# Patient Record
Sex: Male | Born: 1955
Health system: Southern US, Community
[De-identification: ages and names within clinical notes are randomized; demographics above are authoritative.]

## PROBLEM LIST (undated history)

## (undated) ENCOUNTER — Ambulatory Visit (HOSPITAL_COMMUNITY): Discharge status: Absent without leave | Payer: BLUE CROSS/BLUE SHIELD

## (undated) DIAGNOSIS — K219 Gastro-esophageal reflux disease without esophagitis: Secondary | ICD-10-CM

## (undated) DIAGNOSIS — M1990 Unspecified osteoarthritis, unspecified site: Secondary | ICD-10-CM

## (undated) DIAGNOSIS — K1379 Other lesions of oral mucosa: Secondary | ICD-10-CM

## (undated) DIAGNOSIS — E785 Hyperlipidemia, unspecified: Secondary | ICD-10-CM

## (undated) DIAGNOSIS — K859 Acute pancreatitis without necrosis or infection, unspecified: Secondary | ICD-10-CM

## (undated) DIAGNOSIS — Z5112 Encounter for antineoplastic immunotherapy: Secondary | ICD-10-CM

## (undated) DIAGNOSIS — K436 Other and unspecified ventral hernia with obstruction, without gangrene: Secondary | ICD-10-CM

## (undated) DIAGNOSIS — Z7189 Other specified counseling: Secondary | ICD-10-CM

## (undated) DIAGNOSIS — C3491 Malignant neoplasm of unspecified part of right bronchus or lung: Secondary | ICD-10-CM

## (undated) DIAGNOSIS — K625 Hemorrhage of anus and rectum: Secondary | ICD-10-CM

## (undated) DIAGNOSIS — Z5111 Encounter for antineoplastic chemotherapy: Secondary | ICD-10-CM

## (undated) DIAGNOSIS — K5790 Diverticulosis of intestine, part unspecified, without perforation or abscess without bleeding: Secondary | ICD-10-CM

## (undated) DIAGNOSIS — L270 Generalized skin eruption due to drugs and medicaments taken internally: Secondary | ICD-10-CM

## (undated) DIAGNOSIS — I10 Essential (primary) hypertension: Secondary | ICD-10-CM

## (undated) DIAGNOSIS — Z87442 Personal history of urinary calculi: Secondary | ICD-10-CM

## (undated) DIAGNOSIS — Z716 Tobacco abuse counseling: Secondary | ICD-10-CM

## (undated) HISTORY — DX: Tobacco abuse counseling: Z71.6

## (undated) HISTORY — DX: Malignant neoplasm of unspecified part of right bronchus or lung: C34.91

## (undated) HISTORY — PX: EYE SURGERY: SHX253

## (undated) HISTORY — DX: Generalized skin eruption due to drugs and medicaments taken internally: L27.0

## (undated) HISTORY — DX: Encounter for antineoplastic immunotherapy: Z51.12

## (undated) HISTORY — PX: HERNIA REPAIR: SHX51

## (undated) HISTORY — DX: Encounter for antineoplastic chemotherapy: Z51.11

## (undated) HISTORY — DX: Other specified counseling: Z71.89

## (undated) HISTORY — PX: EXPLORATORY LAPAROTOMY: SUR591

---

## 2001-02-20 ENCOUNTER — Emergency Department (HOSPITAL_COMMUNITY): Admission: EM | Admit: 2001-02-20 | Discharge: 2001-02-21 | Payer: Self-pay | Admitting: Emergency Medicine

## 2002-01-15 ENCOUNTER — Emergency Department (HOSPITAL_COMMUNITY): Admission: EM | Admit: 2002-01-15 | Discharge: 2002-01-15 | Payer: Self-pay | Admitting: Emergency Medicine

## 2002-01-15 ENCOUNTER — Encounter: Payer: Self-pay | Admitting: Family Medicine

## 2002-02-13 ENCOUNTER — Emergency Department (HOSPITAL_COMMUNITY): Admission: EM | Admit: 2002-02-13 | Discharge: 2002-02-13 | Payer: Self-pay | Admitting: Emergency Medicine

## 2002-09-07 ENCOUNTER — Ambulatory Visit (HOSPITAL_COMMUNITY): Admission: RE | Admit: 2002-09-07 | Discharge: 2002-09-07 | Payer: Self-pay | Admitting: *Deleted

## 2003-04-16 ENCOUNTER — Encounter: Payer: Self-pay | Admitting: *Deleted

## 2003-04-16 ENCOUNTER — Emergency Department (HOSPITAL_COMMUNITY): Admission: EM | Admit: 2003-04-16 | Discharge: 2003-04-16 | Payer: Self-pay | Admitting: Emergency Medicine

## 2003-05-23 ENCOUNTER — Emergency Department (HOSPITAL_COMMUNITY): Admission: EM | Admit: 2003-05-23 | Discharge: 2003-05-23 | Payer: Self-pay | Admitting: Emergency Medicine

## 2004-01-03 ENCOUNTER — Emergency Department (HOSPITAL_COMMUNITY): Admission: EM | Admit: 2004-01-03 | Discharge: 2004-01-03 | Payer: Self-pay | Admitting: Emergency Medicine

## 2004-06-08 ENCOUNTER — Emergency Department (HOSPITAL_COMMUNITY): Admission: EM | Admit: 2004-06-08 | Discharge: 2004-06-08 | Payer: Self-pay | Admitting: Emergency Medicine

## 2004-10-01 ENCOUNTER — Emergency Department (HOSPITAL_COMMUNITY): Admission: EM | Admit: 2004-10-01 | Discharge: 2004-10-01 | Payer: Self-pay | Admitting: Emergency Medicine

## 2004-10-26 ENCOUNTER — Emergency Department (HOSPITAL_COMMUNITY): Admission: EM | Admit: 2004-10-26 | Discharge: 2004-10-26 | Payer: Self-pay | Admitting: Emergency Medicine

## 2004-12-18 ENCOUNTER — Emergency Department (HOSPITAL_COMMUNITY): Admission: EM | Admit: 2004-12-18 | Discharge: 2004-12-18 | Payer: Self-pay | Admitting: *Deleted

## 2005-10-29 ENCOUNTER — Emergency Department (HOSPITAL_COMMUNITY): Admission: EM | Admit: 2005-10-29 | Discharge: 2005-10-29 | Payer: Self-pay | Admitting: Emergency Medicine

## 2005-11-16 ENCOUNTER — Inpatient Hospital Stay (HOSPITAL_COMMUNITY): Admission: EM | Admit: 2005-11-16 | Discharge: 2005-11-19 | Payer: Self-pay | Admitting: Emergency Medicine

## 2005-11-16 ENCOUNTER — Ambulatory Visit (HOSPITAL_COMMUNITY): Admission: RE | Admit: 2005-11-16 | Discharge: 2005-11-16 | Payer: Self-pay | Admitting: Orthopedic Surgery

## 2005-11-30 ENCOUNTER — Ambulatory Visit (HOSPITAL_COMMUNITY): Admission: RE | Admit: 2005-11-30 | Discharge: 2005-11-30 | Payer: Self-pay | Admitting: Orthopedic Surgery

## 2005-12-28 ENCOUNTER — Ambulatory Visit (HOSPITAL_COMMUNITY): Admission: RE | Admit: 2005-12-28 | Discharge: 2005-12-29 | Payer: Self-pay | Admitting: Orthopedic Surgery

## 2006-11-16 ENCOUNTER — Emergency Department (HOSPITAL_COMMUNITY): Admission: EM | Admit: 2006-11-16 | Discharge: 2006-11-17 | Payer: Self-pay | Admitting: Emergency Medicine

## 2007-10-27 ENCOUNTER — Ambulatory Visit (HOSPITAL_COMMUNITY): Admission: RE | Admit: 2007-10-27 | Discharge: 2007-10-27 | Payer: Self-pay | Admitting: Urology

## 2008-01-02 ENCOUNTER — Ambulatory Visit (HOSPITAL_BASED_OUTPATIENT_CLINIC_OR_DEPARTMENT_OTHER): Admission: RE | Admit: 2008-01-02 | Discharge: 2008-01-02 | Payer: Self-pay | Admitting: Urology

## 2008-03-12 ENCOUNTER — Inpatient Hospital Stay (HOSPITAL_COMMUNITY): Admission: EM | Admit: 2008-03-12 | Discharge: 2008-03-22 | Payer: Self-pay | Admitting: Emergency Medicine

## 2008-06-29 ENCOUNTER — Emergency Department (HOSPITAL_COMMUNITY): Admission: EM | Admit: 2008-06-29 | Discharge: 2008-06-29 | Payer: Self-pay | Admitting: Emergency Medicine

## 2008-08-27 ENCOUNTER — Emergency Department (HOSPITAL_COMMUNITY): Admission: EM | Admit: 2008-08-27 | Discharge: 2008-08-27 | Payer: Self-pay | Admitting: Emergency Medicine

## 2009-09-22 ENCOUNTER — Encounter: Admission: RE | Admit: 2009-09-22 | Discharge: 2009-09-22 | Payer: Self-pay | Admitting: Orthopedic Surgery

## 2010-11-17 NOTE — Op Note (Signed)
[name], [name]             ACCOUNT NO.:  [id number]   MEDICAL RECORD NO.:  [id number]          PATIENT TYPE:  AMB   LOCATION:  [facility]                         FACILITY:  [facility]   PHYSICIAN:  Lindaann Slough, M.D.  DATE OF BIRTH:  08-07-1955   DATE OF PROCEDURE:  01/02/2008  DATE OF DISCHARGE:                               OPERATIVE REPORT   PREOPERATIVE DIAGNOSIS:  Phimosis and balanitis.   POSTOPERATIVE DIAGNOSIS:  Phimosis and balanitis.   PROCEDURE DONE:  Circumcision.   SURGEON:  Danae Chen, M.D.   ANESTHESIA:  General.   INDICATIONS:  The patient is a 55 year old male diabetic who has been  complaining of multiple breakdowns of the foreskin.  It is difficult for  him to retract the foreskin for hygiene purposes.  He was found on  physical examination to have phimosis and balanitis.  He is scheduled  for circumcision.   Under general anesthesia, the patient was prepped and draped and placed  in the supine position.  A penile block was done with 0.25% Marcaine.  Then, two circumferential incisions were made on the foreskin, and then  the foreskin in between those two incisions was excised.  Hemostasis was  secured with electrocautery.  Then, skin approximation was done with #4-  0 chromic.   The patient tolerated the procedure well and left the O.R. in  satisfactory condition to the postanesthesia care unit.      Lindaann Slough, M.D.  Electronically Signed     MN/MEDQ  D:  01/02/2008  T:  01/02/2008  Job:  [id number]
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
## 2010-11-20 NOTE — Op Note (Signed)
[name], [name]                         ACCOUNT NO.:  [id number]   MEDICAL RECORD NO.:  [id number]                   PATIENT TYPE:  AMB   LOCATION:  ENDO                                 FACILITY:  [facility]   PHYSICIAN:  Sharyn Dross., M.D.               DATE OF BIRTH:  February 03, 1956   DATE OF PROCEDURE:  09/07/2002  DATE OF DISCHARGE:                                 OPERATIVE REPORT   REFERRING PHYSICIAN:  Betti D. Pecola Leisure, M.D.   PREOPERATIVE DIAGNOSES:  1. Family history of colon cancer.  2. Blood in stools.   POSTOPERATIVE DIAGNOSES:  1. Diverticula of the proximal, transverse, and ascending colon.  2. Questionable small polyp approximately 5 mm in the ascending colon     (unable to biopsy or remove).  3. Normal exam otherwise.   PROCEDURE:  Colonoscopy.   MEDICATIONS:  Demerol 70 mg IV, Versed 7 mg IV over a 10 minute period of  time.   INSTRUMENT:  Olympus video pancolonoscope.   ENDOSCOPIST:  Dortha Kern, M.D.   SUBJECTIVELY:  See the H&P accompanying the chart.   OBJECTIVE FINDINGS:  VITAL SIGNS:  Stable.  HEENT:  Arcus senilis.  NECK:  Supple.  LUNGS:  Clear to auscultation.  HEART:  Regular rate and rhythm without heaves, thrills, murmurs, or gallops  noted.  ABDOMEN:  Soft.  There was no tenderness and no distention or organomegaly  noted.  No abdominal scars appreciated.  EXTREMITIES:  No cyanosis, clubbing, or edema.   PLAN:  I am going to proceed with the colonoscopic examination today at this  time.   INFORMED CONSENT:  The patient was advised of the procedure, the  indications, and the risks involved.  The patient has agreed to have the  procedure performed at this time.   PREOPERATIVE PREPARATION:  The patient presented to [facility] endoscopy  unit with the IV for IV sedating medication was started.  A monitor was  placed on the patient to monitor the patient's vital signs and oxygen  saturation.  Nasal oxygen at 2 liters per minute was  used, and after  adequate sedation was performed, the procedure was begun.   BOWEL PREPARATION:  The patient was given Visicol tablets and Dulcolax  tablets.  The patient tolerated the bowel prep without any difficulties.  The quality of the prep was good to excellent.   PROCEDURE NOTE:  The instrument was advanced with the patient lying in the  left lateral position to approximately 120 mL proximal colon to the cecal  area.  This was confirmed by visualization of the appendiceal orifice as  well as the ileocecal valve that was noted.  It was also noted by palpation  at this time.  Transillumination could be visualized.   There appeared to be no gross abnormalities such as masses, polyps, or  stricture lesions appreciated at this time.  The vascular pattern appeared  to  be well within normal limits throughout the entire colon that was noted.   However, the mucosal pattern showed evidence of proximal, transverse, and  ascending diverticula that were present.  The diverticula appeared to be  small in character at this time.  There appeared to be no evidence of any  bleeding sites that are noted.  It should also be noted that there was a  small, approximately 5 mm polypoid lesion that was noted.  This was seen on  visualization in the ascending colon.  Attempts to feel this to determine if  this was mucosal folds were unsuccessful with the manipulation of the  instrument.  Thus a biopsy forceps was then advanced into the region, but  visualization as the forcep approached the area for movement and palpation  was unable to be visualized for an adequate successful approach to the area.  This was subsequently halted up to approximately 5-10 minutes of attempting  to evaluate this area more extensively.  There was no discoloration of the  polyp and initial lesion that was seen.  And again, it still could possibly  be a fold.  Photographs were taken of the region at this time.   The  instrument was gradually retracted back where visualization of the rest  of the colon again was unremarkable.  There was no evidence of bleeding  appreciated throughout any other area or any other types of lesions that  were noted.  As the instrument approached the anal verge, the instrument was  retroflexed with visualization of the perianal region that was noted. Again,  there was no evidence of any vascular abnormalities or obstructive process  that was present.  The instrument was corrected and gradually retracted back  at this time.  I did not appreciate any external hemorrhoids upon exiting  from the area.  The patient tolerated the procedure well throughout the  region without difficulty.   TREATMENT:  1. Presently, there is no specific treatment at this time except     recommending that the patient use high fiber products and tentatively     avoid products with seeds in them because of the history of rectal     bleeding that was noted.  2. I would recommend repeating the procedure in approximately 3-5 years     unless evidence of re-bleeding does occur at this time and depending upon     the extent of the bleeding, will determine if the procedure would need to     be done when and if it occurs.  3.  We will have the patient follow up     with me in two weeks to discuss the results of the procedure.                                               Sharyn Dross., M.D.   JM/MEDQ  D:  09/07/2002  T:  09/07/2002  Job:  [id number]   cc:   Jocelyn Lamer D. Pecola Leisure, M.D.  [phone] [address], [address]  [place]  [place] [address]  Fax: [phone]

## 2010-11-20 NOTE — Discharge Summary (Signed)
[name], [name]             ACCOUNT NO.:  [id number]   MEDICAL RECORD NO.:  [id number]          PATIENT TYPE:  INP   LOCATION:  1506                         FACILITY:  [facility]   PHYSICIAN:  Fleet Contras, M.D.    DATE OF BIRTH:  1955/08/26   DATE OF ADMISSION:  03/12/2008  DATE OF DISCHARGE:  03/22/2008                               DISCHARGE SUMMARY   ADMISSION DIAGNOSES:  1. Acute pancreatitis.  2. Uncontrolled type 2 diabetes mellitus.  3. Alcohol abuse   HISTORY OF PRESENT ILLNESS:  Please see the dictated H and P for full  details.  In summary, Mr. Dagher is a 52-year African American  gentleman with past medical history significant for recurrent acute  pancreatitis, type 2 diabetes mellitus, systemic hypertension, gouty  arthritis, gastroesophageal reflux disease, dyslipidemia, alcohol abuse,  tobacco abuse and history of rotator cuff tear of the shoulder.  He came  to the emergency room at [facility] with severe abdominal pain  associated with nausea, but no vomiting, no diarrhea.  No fevers or  chills.  He had admitted he had been drinking some alcohol over the  weekend, followed by pain in the lower back which progressively moved to  the abdomen.  He had no urinary symptoms.  He had not used any  nonsteroidal anti-inflammatory drugs.  In the emergency room he was  noted to have a markedly elevated liver enzymes.  A CT scan of the  abdomen confirmed acute pancreatitis and he was therefore hospitalized  for further treatment.   HOSPITAL COURSE:  On admission he was started on IV fluids with D5  normal saline at 100 mL an hour.  He was kept NPO. CBGs were checked  a.c. and nightly and he was on IV morphine 2-4 mg q.4h. and IV Protonix  40 mg daily, IV Zofran 4 mg q.4h.  He was placed on IV Zosyn when he  developed a fever of 102.2.  Cultures were obtained and by the time of  discharge these were all negative for blood and urine.  Diet was slowly  increased.   His repeated abdominal x-rays showed dilated small bowel  loops compatible with ileus.  The surgical consult was requested.  The  patient was kindly seen by Almond Lint, MD who thought that this was  mostly alcohol related pancreatitis with the ileus and no surgical  intervention was needed.  A ultrasound scan of the gallbladder was  performed and showed sludge only with no stones.  On March 22, 2008,  he was feeling much better, able to tolerate full diet with regular  bowel movements.  His abdominal pain and distention has subsided and he  was considered stable for discharge home.   DISCHARGE DIAGNOSES:  1. Acute pancreatitis, alcohol related.  2. Small bowel ileus.  3. Type 2 diabetes mellitus.  4. Alcohol abuse.   His condition on discharge was stable.   His disposition was for home.  He was to follow-up with me in the office  in 2 weeks.  He  was strongly advised to quit alcohol use.   DISCHARGE MEDICATIONS:  1. Lortab 5/325 one to two q.6h. p.r.n.  2. Nicotine patch 21 mg per day.  3. Thiamine 100 mg daily.  4. Multivitamin once a day.  5. Amaryl 2 mg daily.  6. Colchicine is 0.6 mg b.i.d. p.r.n.  7. Xyzal 5 mg daily.  8. Allopurinol 300 mg daily.  9. Prevacid 30 mg daily.  10.Calcium 600 mg daily.      Fleet Contras, M.D.  Electronically Signed     EA/MEDQ  D:  04/17/2008  T:  04/17/2008  Job:  [id number]

## 2010-11-20 NOTE — Op Note (Signed)
NAMEBLANCA, [name]             ACCOUNT NO.:  [id number]   MEDICAL RECORD NO.:  [id number]          PATIENT TYPE:  OIB   LOCATION:  5016                         FACILITY:  [facility]   PHYSICIAN:  Burnard Bunting, M.D.    DATE OF BIRTH:  [birth year]   DATE OF PROCEDURE:  12/28/2005  DATE OF DISCHARGE:  12/29/2005                                 OPERATIVE REPORT   PREOPERATIVE DIAGNOSIS:  Right shoulder bursitis, possible rotator cuff  tear.   POSTOPERATIVE DIAGNOSIS:  Right shoulder bursitis.   PROCEDURE:  Right shoulder diagnostic arthroscopy with subacromial  decompression.   SURGEON:  Dorene Grebe, M.D.   ANESTHESIA:  General endotracheal.   ESTIMATED BLOOD LOSS:  10 mL.   DRAINS:  None.   INDICATIONS:  [name] is a 55 year old patient with right shoulder  pain.  Arthrogram suggested possible rotator cuff tear; however, this was  not found at the time of surgery.  He presents now for diagnostic  arthroscopy, subacromial decompression, and possible rotator cuff repair.   OPERATIVE FINDINGS:  1.  Examination under anesthesia.  Range of motion:  Forward flexion 180,      external rotation 50 degrees, abduction is at 70.  He has a less than 1+      anterior and posterior instability.  2.  Diagnostic arthroscopy.      1.  No rotator cuff tear from the articular surface.      2.  Intact labrum.      3.  Intact articular surface.      4.  Bursitis in the subacromial space.   With no evidence of rotator cuff tear, we viewed on the bursal side.   PROCEDURE IN DETAIL:  The patient was brought to the operating room where  general endotracheal anesthesia was induced.  He was positioned in the  supine position with the head in a neutral position and the right and left  arm well padded.  Right shoulder, hand, and arm were prepped in DuraPrep  solution and draped in a sterile manner.  Topographic anatomy of the  shoulder was identified, including the posterolateral and anterior  margins  of the acromion.  Solution of saline and epinephrine was injected into the  subacromial space.  A solution of saline was injected into the glenohumeral  joint.  The trocar was then placed through the posterior portal and into the  glenohumeral joint 2 cm medial and inferior to the posterolateral margin of  the acromion.  The joint was inflated, anterior portal was created under  direct visualization, and diagnostic arthroscopy was performed.  The  glenohumeral articular surface was intact.  The labrum was intact, including  the biceps anchor.  There was no rotator cuff tear identified within the  articular viewing area.  Rotator interval was free of synovitis.  A lateral  portal was created and the scope was placed into the subacromial space.  Significant bursitis was encountered.  The CA ligament was released.  Subacromial decompression with bursectomy and co-planing of the clavicle and  acromion was performed using cutting block technique.  Again, no definite  rotator cuff tear was seen.  At this time, the instruments were removed from  the portals, which were closed using 3-0 nylon suture.  The patient was then  placed in a bulky shoulder dressing, having tolerated the procedure well  without any complications.           ______________________________  G. Dorene Grebe, M.D.     GSD/MEDQ  D:  01/30/2006  T:  01/30/2006  Job:  [id number]
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
## 2011-02-17 ENCOUNTER — Emergency Department (HOSPITAL_COMMUNITY): Payer: Self-pay

## 2011-02-17 ENCOUNTER — Inpatient Hospital Stay (HOSPITAL_COMMUNITY)
Admission: EM | Admit: 2011-02-17 | Discharge: 2011-02-20 | DRG: 684 | Disposition: A | Discharge status: Home / Self Care | Payer: Self-pay | Attending: Internal Medicine | Admitting: Internal Medicine

## 2011-02-17 DIAGNOSIS — M109 Gout, unspecified: Secondary | ICD-10-CM | POA: Diagnosis present

## 2011-02-17 DIAGNOSIS — J4489 Other specified chronic obstructive pulmonary disease: Secondary | ICD-10-CM | POA: Diagnosis present

## 2011-02-17 DIAGNOSIS — R7402 Elevation of levels of lactic acid dehydrogenase (LDH): Secondary | ICD-10-CM | POA: Diagnosis present

## 2011-02-17 DIAGNOSIS — N179 Acute kidney failure, unspecified: Principal | ICD-10-CM | POA: Diagnosis present

## 2011-02-17 DIAGNOSIS — I10 Essential (primary) hypertension: Secondary | ICD-10-CM | POA: Diagnosis present

## 2011-02-17 DIAGNOSIS — J309 Allergic rhinitis, unspecified: Secondary | ICD-10-CM | POA: Diagnosis present

## 2011-02-17 DIAGNOSIS — F1010 Alcohol abuse, uncomplicated: Secondary | ICD-10-CM | POA: Diagnosis present

## 2011-02-17 DIAGNOSIS — F17200 Nicotine dependence, unspecified, uncomplicated: Secondary | ICD-10-CM | POA: Diagnosis present

## 2011-02-17 DIAGNOSIS — T383X5A Adverse effect of insulin and oral hypoglycemic [antidiabetic] drugs, initial encounter: Secondary | ICD-10-CM | POA: Diagnosis present

## 2011-02-17 DIAGNOSIS — T46905A Adverse effect of unspecified agents primarily affecting the cardiovascular system, initial encounter: Secondary | ICD-10-CM | POA: Diagnosis present

## 2011-02-17 DIAGNOSIS — E860 Dehydration: Secondary | ICD-10-CM | POA: Diagnosis present

## 2011-02-17 DIAGNOSIS — E119 Type 2 diabetes mellitus without complications: Secondary | ICD-10-CM | POA: Diagnosis present

## 2011-02-17 DIAGNOSIS — R7401 Elevation of levels of liver transaminase levels: Secondary | ICD-10-CM | POA: Diagnosis present

## 2011-02-17 DIAGNOSIS — E785 Hyperlipidemia, unspecified: Secondary | ICD-10-CM | POA: Diagnosis present

## 2011-02-17 DIAGNOSIS — K219 Gastro-esophageal reflux disease without esophagitis: Secondary | ICD-10-CM | POA: Diagnosis present

## 2011-02-17 DIAGNOSIS — J449 Chronic obstructive pulmonary disease, unspecified: Secondary | ICD-10-CM | POA: Diagnosis present

## 2011-02-18 ENCOUNTER — Inpatient Hospital Stay (HOSPITAL_COMMUNITY): Payer: Self-pay

## 2011-02-18 LAB — BASIC METABOLIC PANEL
BUN: 36 mg/dL — ABNORMAL HIGH (ref 6–23)
CO2: 24 mEq/L (ref 19–32)
Calcium: 10.1 mg/dL (ref 8.4–10.5)
Chloride: 100 mEq/L (ref 96–112)
Creatinine, Ser: 3 mg/dL — ABNORMAL HIGH (ref 0.50–1.35)
GFR calc Af Amer: 26 mL/min — ABNORMAL LOW (ref >60)
GFR calc non Af Amer: 22 mL/min — ABNORMAL LOW (ref >60)
Glucose, Bld: 89 mg/dL (ref 70–99)
Potassium: 4.1 mEq/L (ref 3.5–5.1)
Sodium: 136 mEq/L (ref 135–145)

## 2011-02-18 LAB — CBC
HCT: 35.2 % — ABNORMAL LOW (ref 39.0–52.0)
HCT: 36.2 % — ABNORMAL LOW (ref 39.0–52.0)
Hemoglobin: 12.3 g/dL — ABNORMAL LOW (ref 13.0–17.0)
Hemoglobin: 12.6 g/dL — ABNORMAL LOW (ref 13.0–17.0)
MCH: 32.4 pg (ref 26.0–34.0)
MCH: 32.5 pg (ref 26.0–34.0)
MCHC: 34.8 g/dL (ref 30.0–36.0)
MCHC: 34.9 g/dL (ref 30.0–36.0)
MCV: 92.9 fL (ref 78.0–100.0)
MCV: 93.1 fL (ref 78.0–100.0)
Platelets: 172 10*3/uL (ref 150–400)
Platelets: 200 10*3/uL (ref 150–400)
RBC: 3.79 MIL/uL — ABNORMAL LOW (ref 4.22–5.81)
RBC: 3.89 MIL/uL — ABNORMAL LOW (ref 4.22–5.81)
RDW: 15 % (ref 11.5–15.5)
RDW: 15.2 % (ref 11.5–15.5)
WBC: 7.2 10*3/uL (ref 4.0–10.5)
WBC: 8.6 10*3/uL (ref 4.0–10.5)

## 2011-02-18 LAB — DIFFERENTIAL
Basophils Absolute: 0.1 10*3/uL (ref 0.0–0.1)
Basophils Relative: 1 % (ref 0–1)
Eosinophils Absolute: 0.3 10*3/uL (ref 0.0–0.7)
Eosinophils Relative: 3 % (ref 0–5)
Lymphocytes Relative: 43 % (ref 12–46)
Lymphs Abs: 3.7 10*3/uL (ref 0.7–4.0)
Monocytes Absolute: 0.9 10*3/uL (ref 0.1–1.0)
Monocytes Relative: 10 % (ref 3–12)
Neutro Abs: 3.6 10*3/uL (ref 1.7–7.7)
Neutrophils Relative %: 43 % (ref 43–77)

## 2011-02-18 LAB — COMPREHENSIVE METABOLIC PANEL
ALT: 111 U/L — ABNORMAL HIGH (ref 0–53)
AST: 88 U/L — ABNORMAL HIGH (ref 0–37)
Albumin: 3.1 g/dL — ABNORMAL LOW (ref 3.5–5.2)
Alkaline Phosphatase: 89 U/L (ref 39–117)
BUN: 33 mg/dL — ABNORMAL HIGH (ref 6–23)
CO2: 23 mEq/L (ref 19–32)
Calcium: 10.3 mg/dL (ref 8.4–10.5)
Chloride: 100 mEq/L (ref 96–112)
Creatinine, Ser: 4.24 mg/dL — ABNORMAL HIGH (ref 0.50–1.35)
GFR calc Af Amer: 18 mL/min — ABNORMAL LOW (ref >60)
GFR calc non Af Amer: 15 mL/min — ABNORMAL LOW (ref >60)
Glucose, Bld: 105 mg/dL — ABNORMAL HIGH (ref 70–99)
Potassium: 4 mEq/L (ref 3.5–5.1)
Sodium: 137 mEq/L (ref 135–145)
Total Bilirubin: 0.8 mg/dL (ref 0.3–1.2)
Total Protein: 7.4 g/dL (ref 6.0–8.3)

## 2011-02-18 LAB — GLUCOSE, CAPILLARY
Glucose-Capillary: 90 mg/dL (ref 70–99)
Glucose-Capillary: 105 mg/dL — ABNORMAL HIGH (ref 70–99)
Glucose-Capillary: 87 mg/dL (ref 70–99)
Glucose-Capillary: 90 mg/dL (ref 70–99)

## 2011-02-18 LAB — URINE MICROSCOPIC-ADD ON

## 2011-02-18 LAB — PHOSPHORUS: Phosphorus: 3.6 mg/dL (ref 2.3–4.6)

## 2011-02-18 LAB — URINALYSIS, ROUTINE W REFLEX MICROSCOPIC
Glucose, UA: NEGATIVE mg/dL
Hgb urine dipstick: NEGATIVE
Ketones, ur: 15 mg/dL — AB
Nitrite: NEGATIVE
Protein, ur: 100 mg/dL — AB
Specific Gravity, Urine: 1.022 (ref 1.005–1.030)
Urobilinogen, UA: 1 mg/dL (ref 0.0–1.0)
pH: 5.5 (ref 5.0–8.0)

## 2011-02-18 LAB — RAPID URINE DRUG SCREEN, HOSP PERFORMED
Amphetamines: NOT DETECTED
Barbiturates: NOT DETECTED
Benzodiazepines: NOT DETECTED
Cocaine: POSITIVE — AB
Opiates: NOT DETECTED
Tetrahydrocannabinol: NOT DETECTED

## 2011-02-18 LAB — PROTIME-INR
INR: 1.12 (ref 0.00–1.49)
Prothrombin Time: 14.6 seconds (ref 11.6–15.2)

## 2011-02-18 LAB — AMMONIA: Ammonia: 34 umol/L (ref 11–60)

## 2011-02-18 LAB — POCT I-STAT TROPONIN I
Troponin i, poc: 0 ng/mL (ref 0.00–0.08)
Troponin i, poc: 0 ng/mL (ref 0.00–0.08)

## 2011-02-18 LAB — ETHANOL: Alcohol, Ethyl (B): 182 mg/dL — ABNORMAL HIGH (ref 0–11)
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
## 2011-02-19 LAB — GLUCOSE, CAPILLARY
Glucose-Capillary: 106 mg/dL — ABNORMAL HIGH (ref 70–99)
Glucose-Capillary: 115 mg/dL — ABNORMAL HIGH (ref 70–99)
Glucose-Capillary: 97 mg/dL (ref 70–99)
Glucose-Capillary: 112 mg/dL — ABNORMAL HIGH (ref 70–99)

## 2011-02-19 LAB — BASIC METABOLIC PANEL
BUN: 30 mg/dL — ABNORMAL HIGH (ref 6–23)
CO2: 26 mEq/L (ref 19–32)
Calcium: 9.2 mg/dL (ref 8.4–10.5)
Chloride: 100 mEq/L (ref 96–112)
Creatinine, Ser: 1.5 mg/dL — ABNORMAL HIGH (ref 0.50–1.35)
GFR calc Af Amer: 59 mL/min — ABNORMAL LOW (ref >60)
GFR calc non Af Amer: 48 mL/min — ABNORMAL LOW (ref >60)
Glucose, Bld: 87 mg/dL (ref 70–99)
Potassium: 3.8 mEq/L (ref 3.5–5.1)
Sodium: 134 mEq/L — ABNORMAL LOW (ref 135–145)

## 2011-02-19 LAB — CBC
HCT: 35 % — ABNORMAL LOW (ref 39.0–52.0)
Hemoglobin: 12 g/dL — ABNORMAL LOW (ref 13.0–17.0)
MCH: 32.4 pg (ref 26.0–34.0)
MCHC: 34.3 g/dL (ref 30.0–36.0)
MCV: 94.6 fL (ref 78.0–100.0)
Platelets: 162 10*3/uL (ref 150–400)
RBC: 3.7 MIL/uL — ABNORMAL LOW (ref 4.22–5.81)
RDW: 15.2 % (ref 11.5–15.5)
WBC: 5.8 10*3/uL (ref 4.0–10.5)

## 2011-02-19 LAB — B. BURGDORFI ANTIBODIES: B burgdorferi Ab IgG+IgM: 0.17 {ISR}

## 2011-02-19 LAB — HEPATITIS PANEL, ACUTE
HCV Ab: NEGATIVE
Hep A IgM: NEGATIVE
Hep B C IgM: NEGATIVE
Hepatitis B Surface Ag: NEGATIVE

## 2011-02-19 LAB — VITAMIN B12: Vitamin B-12: 981 pg/mL — ABNORMAL HIGH (ref 211–911)

## 2011-02-20 LAB — BASIC METABOLIC PANEL
BUN: 19 mg/dL (ref 6–23)
CO2: 26 mEq/L (ref 19–32)
Calcium: 9.2 mg/dL (ref 8.4–10.5)
Chloride: 101 mEq/L (ref 96–112)
Creatinine, Ser: 1.34 mg/dL (ref 0.50–1.35)
GFR calc Af Amer: >60 mL/min (ref >60)
GFR calc non Af Amer: 55 mL/min — ABNORMAL LOW (ref >60)
Glucose, Bld: 107 mg/dL — ABNORMAL HIGH (ref 70–99)
Potassium: 3.7 mEq/L (ref 3.5–5.1)
Sodium: 136 mEq/L (ref 135–145)

## 2011-02-20 LAB — LIPASE, BLOOD: Lipase: 58 U/L (ref 11–59)

## 2011-02-20 LAB — GLUCOSE, CAPILLARY
Glucose-Capillary: 123 mg/dL — ABNORMAL HIGH (ref 70–99)
Glucose-Capillary: 109 mg/dL — ABNORMAL HIGH (ref 70–99)

## 2011-02-20 LAB — HEMOGLOBIN A1C
Hgb A1c MFr Bld: 6.1 % — ABNORMAL HIGH (ref <5.7)
Mean Plasma Glucose: 128 mg/dL — ABNORMAL HIGH (ref <117)
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
## 2011-03-30 LAB — POCT I-STAT, CHEM 8
BUN: 21
Calcium, Ion: 1.25
Chloride: 107
Creatinine, Ser: 1.4
Glucose, Bld: 97
HCT: 45
Hemoglobin: 15.3
Potassium: 4
Sodium: 140
TCO2: 24

## 2011-03-30 LAB — ETHANOL: Alcohol, Ethyl (B): 64 — ABNORMAL HIGH

## 2011-04-01 LAB — POCT I-STAT 4, (NA,K, GLUC, HGB,HCT)
Glucose, Bld: 145 — ABNORMAL HIGH
HCT: 46
Hemoglobin: 15.6
Operator id: 268271
Potassium: 3.8
Sodium: 135

## 2011-04-01 LAB — ETHANOL: Alcohol, Ethyl (B): <5

## 2011-04-05 LAB — GLUCOSE, CAPILLARY
Glucose-Capillary: 100 — ABNORMAL HIGH
Glucose-Capillary: 106 — ABNORMAL HIGH
Glucose-Capillary: 108 — ABNORMAL HIGH
Glucose-Capillary: 113 — ABNORMAL HIGH
Glucose-Capillary: 114 — ABNORMAL HIGH
Glucose-Capillary: 116 — ABNORMAL HIGH
Glucose-Capillary: 123 — ABNORMAL HIGH
Glucose-Capillary: 123 — ABNORMAL HIGH
Glucose-Capillary: 134 — ABNORMAL HIGH
Glucose-Capillary: 88
Glucose-Capillary: 88
Glucose-Capillary: 90
Glucose-Capillary: 92
Glucose-Capillary: 92

## 2011-04-05 LAB — COMPREHENSIVE METABOLIC PANEL
ALT: 46
AST: 40 — ABNORMAL HIGH
Albumin: 2.3 — ABNORMAL LOW
Alkaline Phosphatase: 111
BUN: 10
CO2: 27
Calcium: 9.1
Chloride: 102
Creatinine, Ser: 0.99
GFR calc Af Amer: >60
GFR calc non Af Amer: >60
Glucose, Bld: 105 — ABNORMAL HIGH
Potassium: 3.8
Sodium: 136
Total Bilirubin: 1
Total Protein: 7.3

## 2011-04-05 LAB — CBC
HCT: 29.4 — ABNORMAL LOW
HCT: 31.1 — ABNORMAL LOW
Hemoglobin: 10.2 — ABNORMAL LOW
Hemoglobin: 10.6 — ABNORMAL LOW
MCHC: 34.2
MCHC: 34.7
MCV: 93.8
MCV: 95.4
Platelets: 279
Platelets: 386
RBC: 3.13 — ABNORMAL LOW
RBC: 3.26 — ABNORMAL LOW
RDW: 13.7
RDW: 14.1
WBC: 10.7 — ABNORMAL HIGH
WBC: 13.8 — ABNORMAL HIGH

## 2011-04-05 LAB — BASIC METABOLIC PANEL
BUN: 12
CO2: 22
Calcium: 8.9
Chloride: 102
Creatinine, Ser: 0.97
GFR calc Af Amer: >60
GFR calc non Af Amer: >60
Glucose, Bld: 95
Potassium: 3.6
Sodium: 135

## 2011-04-05 LAB — AMMONIA: Ammonia: 30

## 2011-04-07 LAB — POCT I-STAT, CHEM 8
BUN: 12
Calcium, Ion: 1.01 — ABNORMAL LOW
Chloride: 106
Creatinine, Ser: 1.3
Glucose, Bld: 165 — ABNORMAL HIGH
HCT: 48
Hemoglobin: 16.3
Potassium: 4.7
Sodium: 134 — ABNORMAL LOW
TCO2: 25

## 2011-04-07 LAB — GLUCOSE, CAPILLARY
Glucose-Capillary: 102 — ABNORMAL HIGH
Glucose-Capillary: 117 — ABNORMAL HIGH
Glucose-Capillary: 118 — ABNORMAL HIGH
Glucose-Capillary: 124 — ABNORMAL HIGH
Glucose-Capillary: 134 — ABNORMAL HIGH
Glucose-Capillary: 137 — ABNORMAL HIGH
Glucose-Capillary: 141 — ABNORMAL HIGH
Glucose-Capillary: 145 — ABNORMAL HIGH
Glucose-Capillary: 145 — ABNORMAL HIGH
Glucose-Capillary: 158 — ABNORMAL HIGH
Glucose-Capillary: 160 — ABNORMAL HIGH
Glucose-Capillary: 164 — ABNORMAL HIGH
Glucose-Capillary: 165 — ABNORMAL HIGH
Glucose-Capillary: 175 — ABNORMAL HIGH
Glucose-Capillary: 179 — ABNORMAL HIGH
Glucose-Capillary: 183 — ABNORMAL HIGH
Glucose-Capillary: 186 — ABNORMAL HIGH
Glucose-Capillary: 190 — ABNORMAL HIGH
Glucose-Capillary: 193 — ABNORMAL HIGH
Glucose-Capillary: 194 — ABNORMAL HIGH
Glucose-Capillary: 80
Glucose-Capillary: 81
Glucose-Capillary: 81
Glucose-Capillary: 82
Glucose-Capillary: 97

## 2011-04-07 LAB — CBC
HCT: 31.7 — ABNORMAL LOW
HCT: 32.4 — ABNORMAL LOW
HCT: 34.7 — ABNORMAL LOW
HCT: 35.8 — ABNORMAL LOW
HCT: 39.3
HCT: 40.1
Hemoglobin: 11 — ABNORMAL LOW
Hemoglobin: 11.1 — ABNORMAL LOW
Hemoglobin: 11.9 — ABNORMAL LOW
Hemoglobin: 12.4 — ABNORMAL LOW
Hemoglobin: 13.1
Hemoglobin: 13.9
MCHC: 33.7
MCHC: 34.1
MCHC: 34.3
MCHC: 34.6
MCHC: 34.6
MCHC: 34.7
MCV: 93.8
MCV: 94.1
MCV: 94.3
MCV: 94.8
MCV: 95.5
MCV: 95.7
Platelets: 171
Platelets: 184
Platelets: 184
Platelets: 185
Platelets: 232
Platelets: 247
RBC: 3.37 — ABNORMAL LOW
RBC: 3.4 — ABNORMAL LOW
RBC: 3.63 — ABNORMAL LOW
RBC: 3.79 — ABNORMAL LOW
RBC: 4.2 — ABNORMAL LOW
RBC: 4.23
RDW: 13.8
RDW: 14.3
RDW: 14.4
RDW: 14.4
RDW: 14.6
RDW: 14.7
WBC: 10
WBC: 10
WBC: 10.6 — ABNORMAL HIGH
WBC: 11 — ABNORMAL HIGH
WBC: 8.9
WBC: 9.2

## 2011-04-07 LAB — DIFFERENTIAL
Basophils Absolute: 0
Basophils Absolute: 0
Basophils Relative: 0
Basophils Relative: 0
Eosinophils Absolute: 0.1
Eosinophils Absolute: 0.1
Eosinophils Relative: 1
Eosinophils Relative: 2
Lymphocytes Relative: 15
Lymphocytes Relative: 16
Lymphs Abs: 1.5
Lymphs Abs: 1.5
Monocytes Absolute: 0.4
Monocytes Absolute: 0.9
Monocytes Relative: 4
Monocytes Relative: 9
Neutro Abs: 7.3
Neutro Abs: 7.4
Neutrophils Relative %: 75
Neutrophils Relative %: 79 — ABNORMAL HIGH

## 2011-04-07 LAB — COMPREHENSIVE METABOLIC PANEL
ALT: 39
ALT: 53
ALT: 60 — ABNORMAL HIGH
ALT: 68 — ABNORMAL HIGH
ALT: 98 — ABNORMAL HIGH
AST: 132 — ABNORMAL HIGH
AST: 36
AST: 53 — ABNORMAL HIGH
AST: 70 — ABNORMAL HIGH
AST: 73 — ABNORMAL HIGH
Albumin: 2.3 — ABNORMAL LOW
Albumin: 2.4 — ABNORMAL LOW
Albumin: 2.4 — ABNORMAL LOW
Albumin: 2.7 — ABNORMAL LOW
Albumin: 3.2 — ABNORMAL LOW
Alkaline Phosphatase: 100
Alkaline Phosphatase: 54
Alkaline Phosphatase: 60
Alkaline Phosphatase: 64
Alkaline Phosphatase: 79
BUN: 11
BUN: 12
BUN: 15
BUN: 16
BUN: 9
CO2: 19
CO2: 21
CO2: 22
CO2: 22
CO2: 23
Calcium: 7.2 — ABNORMAL LOW
Calcium: 7.5 — ABNORMAL LOW
Calcium: 8.1 — ABNORMAL LOW
Calcium: 8.2 — ABNORMAL LOW
Calcium: 8.9
Chloride: 102
Chloride: 103
Chloride: 104
Chloride: 104
Chloride: 107
Creatinine, Ser: 0.98
Creatinine, Ser: 1.1
Creatinine, Ser: 1.23
Creatinine, Ser: 1.67 — ABNORMAL HIGH
Creatinine, Ser: 1.99 — ABNORMAL HIGH
GFR calc Af Amer: 43 — ABNORMAL LOW
GFR calc Af Amer: 52 — ABNORMAL LOW
GFR calc Af Amer: >60
GFR calc Af Amer: >60
GFR calc Af Amer: >60
GFR calc non Af Amer: 35 — ABNORMAL LOW
GFR calc non Af Amer: 43 — ABNORMAL LOW
GFR calc non Af Amer: >60
GFR calc non Af Amer: >60
GFR calc non Af Amer: >60
Glucose, Bld: 106 — ABNORMAL HIGH
Glucose, Bld: 152 — ABNORMAL HIGH
Glucose, Bld: 169 — ABNORMAL HIGH
Glucose, Bld: 175 — ABNORMAL HIGH
Glucose, Bld: 199 — ABNORMAL HIGH
Potassium: 3.7
Potassium: 3.7
Potassium: 4
Potassium: 4.7
Potassium: 6.4
Sodium: 132 — ABNORMAL LOW
Sodium: 134 — ABNORMAL LOW
Sodium: 134 — ABNORMAL LOW
Sodium: 135
Sodium: 136
Total Bilirubin: 1.6 — ABNORMAL HIGH
Total Bilirubin: 2.2 — ABNORMAL HIGH
Total Bilirubin: 2.3 — ABNORMAL HIGH
Total Bilirubin: 2.8 — ABNORMAL HIGH
Total Bilirubin: 3.7 — ABNORMAL HIGH
Total Protein: 6.6
Total Protein: 6.7
Total Protein: 7.1
Total Protein: 7.1
Total Protein: 7.7

## 2011-04-07 LAB — URINALYSIS, ROUTINE W REFLEX MICROSCOPIC
Bilirubin Urine: NEGATIVE
Glucose, UA: 100 — AB
Glucose, UA: 250 — AB
Hgb urine dipstick: NEGATIVE
Hgb urine dipstick: NEGATIVE
Ketones, ur: NEGATIVE
Leukocytes, UA: NEGATIVE
Nitrite: NEGATIVE
Nitrite: POSITIVE — AB
Protein, ur: 100 — AB
Protein, ur: 30 — AB
Specific Gravity, Urine: 1.022
Specific Gravity, Urine: 1.041 — ABNORMAL HIGH
Urobilinogen, UA: 1
Urobilinogen, UA: 1
pH: 5
pH: 6

## 2011-04-07 LAB — URINE CULTURE
Colony Count: NO GROWTH
Culture: NO GROWTH
Special Requests: POSITIVE

## 2011-04-07 LAB — CULTURE, BLOOD (ROUTINE X 2)
Culture: NO GROWTH
Culture: NO GROWTH

## 2011-04-07 LAB — BASIC METABOLIC PANEL
BUN: 6
CO2: 25
Calcium: 8.9
Chloride: 103
Creatinine, Ser: 1.1
GFR calc Af Amer: >60
GFR calc non Af Amer: >60
Glucose, Bld: 137 — ABNORMAL HIGH
Potassium: 3.9
Sodium: 135

## 2011-04-07 LAB — URINE MICROSCOPIC-ADD ON

## 2011-04-07 LAB — LIPASE, BLOOD
Lipase: 182 — ABNORMAL HIGH
Lipase: 596 — ABNORMAL HIGH
Lipase: 68 — ABNORMAL HIGH
Lipase: 82 — ABNORMAL HIGH

## 2011-04-07 LAB — URIC ACID: Uric Acid, Serum: 5.1

## 2011-04-09 LAB — GLUCOSE, CAPILLARY: Glucose-Capillary: 97 mg/dL (ref 70–99)

## 2011-05-11 ENCOUNTER — Encounter (HOSPITAL_COMMUNITY): Payer: Self-pay

## 2011-05-11 ENCOUNTER — Encounter: Payer: Self-pay | Admitting: *Deleted

## 2011-05-11 ENCOUNTER — Encounter (HOSPITAL_COMMUNITY): Admission: EM | Disposition: A | Discharge status: Home / Self Care | Payer: Self-pay | Source: Home / Self Care

## 2011-05-11 ENCOUNTER — Other Ambulatory Visit: Payer: Self-pay

## 2011-05-11 ENCOUNTER — Emergency Department (HOSPITAL_COMMUNITY): Payer: Self-pay

## 2011-05-11 ENCOUNTER — Inpatient Hospital Stay (HOSPITAL_COMMUNITY)
Admission: EM | Admit: 2011-05-11 | Discharge: 2011-05-19 | DRG: 330 | Disposition: A | Discharge status: Home / Self Care | Payer: Self-pay | Attending: General Surgery | Admitting: General Surgery

## 2011-05-11 ENCOUNTER — Encounter (INDEPENDENT_AMBULATORY_CARE_PROVIDER_SITE_OTHER): Payer: Self-pay | Admitting: General Surgery

## 2011-05-11 ENCOUNTER — Encounter (HOSPITAL_COMMUNITY): Payer: Self-pay | Admitting: *Deleted

## 2011-05-11 DIAGNOSIS — R112 Nausea with vomiting, unspecified: Secondary | ICD-10-CM

## 2011-05-11 DIAGNOSIS — K560 Paralytic ileus: Secondary | ICD-10-CM | POA: Diagnosis not present

## 2011-05-11 DIAGNOSIS — E119 Type 2 diabetes mellitus without complications: Secondary | ICD-10-CM | POA: Diagnosis present

## 2011-05-11 DIAGNOSIS — K929 Disease of digestive system, unspecified: Secondary | ICD-10-CM | POA: Diagnosis not present

## 2011-05-11 DIAGNOSIS — Y838 Other surgical procedures as the cause of abnormal reaction of the patient, or of later complication, without mention of misadventure at the time of the procedure: Secondary | ICD-10-CM | POA: Diagnosis not present

## 2011-05-11 DIAGNOSIS — K219 Gastro-esophageal reflux disease without esophagitis: Secondary | ICD-10-CM | POA: Diagnosis present

## 2011-05-11 DIAGNOSIS — K55059 Acute (reversible) ischemia of intestine, part and extent unspecified: Secondary | ICD-10-CM

## 2011-05-11 DIAGNOSIS — K436 Other and unspecified ventral hernia with obstruction, without gangrene: Principal | ICD-10-CM | POA: Diagnosis present

## 2011-05-11 DIAGNOSIS — R109 Unspecified abdominal pain: Secondary | ICD-10-CM

## 2011-05-11 DIAGNOSIS — I10 Essential (primary) hypertension: Secondary | ICD-10-CM

## 2011-05-11 DIAGNOSIS — F17200 Nicotine dependence, unspecified, uncomplicated: Secondary | ICD-10-CM | POA: Diagnosis present

## 2011-05-11 DIAGNOSIS — K430 Incisional hernia with obstruction, without gangrene: Secondary | ICD-10-CM

## 2011-05-11 DIAGNOSIS — Z7982 Long term (current) use of aspirin: Secondary | ICD-10-CM

## 2011-05-11 DIAGNOSIS — M109 Gout, unspecified: Secondary | ICD-10-CM | POA: Diagnosis present

## 2011-05-11 HISTORY — DX: Essential (primary) hypertension: I10

## 2011-05-11 HISTORY — DX: Other and unspecified ventral hernia with obstruction, without gangrene: K43.6

## 2011-05-11 HISTORY — DX: Gastro-esophageal reflux disease without esophagitis: K21.9

## 2011-05-11 HISTORY — PX: INCISIONAL HERNIA REPAIR: SHX193

## 2011-05-11 HISTORY — DX: Other lesions of oral mucosa: K13.79

## 2011-05-11 HISTORY — DX: Hyperlipidemia, unspecified: E78.5

## 2011-05-11 LAB — CBC
HCT: 40.2 % (ref 39.0–52.0)
Hemoglobin: 13.4 g/dL (ref 13.0–17.0)
MCH: 32.4 pg (ref 26.0–34.0)
MCHC: 33.3 g/dL (ref 30.0–36.0)
MCV: 97.1 fL (ref 78.0–100.0)
Platelets: 147 10*3/uL — ABNORMAL LOW (ref 150–400)
RBC: 4.14 MIL/uL — ABNORMAL LOW (ref 4.22–5.81)
RDW: 12.5 % (ref 11.5–15.5)
WBC: 4.5 10*3/uL (ref 4.0–10.5)

## 2011-05-11 LAB — DIFFERENTIAL
Basophils Absolute: 0 10*3/uL (ref 0.0–0.1)
Basophils Relative: 1 % (ref 0–1)
Eosinophils Absolute: 0 10*3/uL (ref 0.0–0.7)
Eosinophils Relative: 1 % (ref 0–5)
Lymphocytes Relative: 23 % (ref 12–46)
Lymphs Abs: 1 10*3/uL (ref 0.7–4.0)
Monocytes Absolute: 0.4 10*3/uL (ref 0.1–1.0)
Monocytes Relative: 8 % (ref 3–12)
Neutro Abs: 3.1 10*3/uL (ref 1.7–7.7)
Neutrophils Relative %: 68 % (ref 43–77)

## 2011-05-11 LAB — COMPREHENSIVE METABOLIC PANEL
ALT: 185 U/L — ABNORMAL HIGH (ref 0–53)
AST: 244 U/L — ABNORMAL HIGH (ref 0–37)
Albumin: 3.5 g/dL (ref 3.5–5.2)
Alkaline Phosphatase: 130 U/L — ABNORMAL HIGH (ref 39–117)
BUN: 13 mg/dL (ref 6–23)
CO2: 23 mEq/L (ref 19–32)
Calcium: 9.6 mg/dL (ref 8.4–10.5)
Chloride: 97 mEq/L (ref 96–112)
Creatinine, Ser: 0.77 mg/dL (ref 0.50–1.35)
GFR calc Af Amer: >90 mL/min (ref >90)
GFR calc non Af Amer: >90 mL/min (ref >90)
Glucose, Bld: 162 mg/dL — ABNORMAL HIGH (ref 70–99)
Potassium: 4.2 mEq/L (ref 3.5–5.1)
Sodium: 132 mEq/L — ABNORMAL LOW (ref 135–145)
Total Bilirubin: 0.9 mg/dL (ref 0.3–1.2)
Total Protein: 8.5 g/dL — ABNORMAL HIGH (ref 6.0–8.3)

## 2011-05-11 LAB — GLUCOSE, CAPILLARY: Glucose-Capillary: 123 mg/dL — ABNORMAL HIGH (ref 70–99)

## 2011-05-11 SURGERY — REPAIR, HERNIA, INCISIONAL
Anesthesia: General | Site: Abdomen | Wound class: Contaminated

## 2011-05-11 MED ORDER — CEFOXITIN SODIUM-DEXTROSE 1-4 GM-% IV SOLR (PREMIX)
INTRAVENOUS | Status: AC
  Filled 2011-05-11: qty 100

## 2011-05-11 MED ORDER — INSULIN ASPART 100 UNIT/ML ~~LOC~~ SOLN
0.0000 [IU] | SUBCUTANEOUS | Status: DC
  Administered 2011-05-12: 2 [IU] via SUBCUTANEOUS
  Administered 2011-05-12: 3 [IU] via SUBCUTANEOUS
  Filled 2011-05-11: qty 3

## 2011-05-11 MED ORDER — LACTATED RINGERS IV SOLN
INTRAVENOUS | Status: DC | PRN
  Administered 2011-05-11: 20:00:00 via INTRAVENOUS

## 2011-05-11 MED ORDER — ACETAMINOPHEN 10 MG/ML IV SOLN
INTRAVENOUS | Status: DC | PRN
  Administered 2011-05-11: 1000 mg via INTRAVENOUS

## 2011-05-11 MED ORDER — ACETAMINOPHEN 325 MG PO TABS: 650.0000 mg | ORAL_TABLET | Freq: Four times a day (QID) | ORAL | Status: DC | PRN

## 2011-05-11 MED ORDER — MIDAZOLAM HCL 5 MG/5ML IJ SOLN
INTRAMUSCULAR | Status: DC | PRN
  Administered 2011-05-11: 0.5 mg via INTRAVENOUS
  Administered 2011-05-11: 1 mg via INTRAVENOUS

## 2011-05-11 MED ORDER — BUPIVACAINE-EPINEPHRINE 0.25% -1:200000 IJ SOLN
INTRAMUSCULAR | Status: AC
  Filled 2011-05-11: qty 1

## 2011-05-11 MED ORDER — FENTANYL CITRATE 0.05 MG/ML IJ SOLN
INTRAMUSCULAR | Status: DC | PRN
  Administered 2011-05-11 (×3): 100 ug via INTRAVENOUS

## 2011-05-11 MED ORDER — LIDOCAINE HCL (CARDIAC) 20 MG/ML IV SOLN
INTRAVENOUS | Status: DC | PRN
  Administered 2011-05-11: 80 mg via INTRAVENOUS

## 2011-05-11 MED ORDER — PANTOPRAZOLE SODIUM 40 MG IV SOLR
40.0000 mg | Freq: Every day | INTRAVENOUS | Status: DC
  Filled 2011-05-11: qty 40

## 2011-05-11 MED ORDER — CISATRACURIUM BESYLATE 2 MG/ML IV SOLN
INTRAVENOUS | Status: DC | PRN
  Administered 2011-05-11: 6 mg via INTRAVENOUS

## 2011-05-11 MED ORDER — MORPHINE SULFATE 2 MG/ML IJ SOLN
1.0000 mg | INTRAMUSCULAR | Status: DC | PRN
  Administered 2011-05-11: 2 mg via INTRAVENOUS
  Filled 2011-05-11: qty 1

## 2011-05-11 MED ORDER — SODIUM CHLORIDE 0.9 % IV SOLN
999.0000 mL | Freq: Once | INTRAVENOUS | Status: AC
  Administered 2011-05-11: 999 mL via INTRAVENOUS

## 2011-05-11 MED ORDER — ONDANSETRON HCL 4 MG/2ML IJ SOLN: 4.0000 mg | Freq: Four times a day (QID) | INTRAMUSCULAR | Status: DC | PRN

## 2011-05-11 MED ORDER — HYDROMORPHONE HCL PF 1 MG/ML IJ SOLN
1.0000 mg | Freq: Once | INTRAMUSCULAR | Status: AC
  Administered 2011-05-11: 1 mg via INTRAVENOUS
  Filled 2011-05-11: qty 1

## 2011-05-11 MED ORDER — SUCCINYLCHOLINE CHLORIDE 20 MG/ML IJ SOLN
INTRAMUSCULAR | Status: DC | PRN
  Administered 2011-05-11: 100 mg via INTRAVENOUS

## 2011-05-11 MED ORDER — ACETAMINOPHEN 10 MG/ML IV SOLN
INTRAVENOUS | Status: AC
  Filled 2011-05-11: qty 100

## 2011-05-11 MED ORDER — DEXTROSE 5 % IV SOLN
1.0000 g | INTRAVENOUS | Status: DC | PRN
  Administered 2011-05-11 (×2): 1 g via INTRAVENOUS

## 2011-05-11 MED ORDER — PROPOFOL 10 MG/ML IV EMUL
INTRAVENOUS | Status: DC | PRN
  Administered 2011-05-11: 200 mg via INTRAVENOUS

## 2011-05-11 MED ORDER — ACETAMINOPHEN 650 MG RE SUPP
650.0000 mg | Freq: Four times a day (QID) | RECTAL | Status: DC | PRN
  Filled 2011-05-11: qty 1

## 2011-05-11 MED ORDER — ONDANSETRON HCL 4 MG/2ML IJ SOLN
4.0000 mg | Freq: Once | INTRAMUSCULAR | Status: AC
  Administered 2011-05-11: 4 mg via INTRAVENOUS
  Filled 2011-05-11: qty 2

## 2011-05-11 MED ORDER — CEFAZOLIN SODIUM 1-5 GM-% IV SOLN
INTRAVENOUS | Status: AC
  Filled 2011-05-11: qty 100

## 2011-05-11 MED ORDER — CEFAZOLIN SODIUM 1-5 GM-% IV SOLN
1.0000 g | Freq: Once | INTRAVENOUS | Status: AC
  Administered 2011-05-11: 1 g via INTRAVENOUS
  Filled 2011-05-11: qty 50

## 2011-05-11 MED ORDER — POTASSIUM CHLORIDE IN NACL 20-0.9 MEQ/L-% IV SOLN
INTRAVENOUS | Status: DC
  Administered 2011-05-11: 18:00:00 via INTRAVENOUS
  Administered 2011-05-12 (×2): 125 mL via INTRAVENOUS
  Administered 2011-05-12 – 2011-05-18 (×12): via INTRAVENOUS
  Filled 2011-05-11 (×21): qty 1000

## 2011-05-11 MED ORDER — GLYCOPYRROLATE 0.2 MG/ML IJ SOLN
INTRAMUSCULAR | Status: DC | PRN
  Administered 2011-05-11: .4 mg via INTRAVENOUS

## 2011-05-11 MED ORDER — NEOSTIGMINE METHYLSULFATE 1 MG/ML IJ SOLN
INTRAMUSCULAR | Status: DC | PRN
  Administered 2011-05-11: 2 mg via INTRAVENOUS

## 2011-05-11 SURGICAL SUPPLY — 37 items
BINDER ABD UNIV 10 28-50 (GAUZE/BANDAGES/DRESSINGS) IMPLANT
BINDER ABD UNIV 12 45-62 (WOUND CARE) IMPLANT
BINDER ABDOM UNIV 10 (GAUZE/BANDAGES/DRESSINGS) ×2
BINDER ABDOMINAL 46IN 62IN (WOUND CARE) ×2
CHLORAPREP W/TINT 26ML (MISCELLANEOUS) ×1 IMPLANT
CLOTH BEACON ORANGE TIMEOUT ST (SAFETY) ×2 IMPLANT
COVER MAYO STAND STRL (DRAPES) ×1 IMPLANT
COVER SURGICAL LIGHT HANDLE (MISCELLANEOUS) ×1 IMPLANT
DRAPE LAPAROSCOPIC ABDOMINAL (DRAPES) ×1 IMPLANT
DRAPE UTILITY 15X26 (DRAPE) ×1 IMPLANT
DRESSING TELFA 8X3 (GAUZE/BANDAGES/DRESSINGS) ×3 IMPLANT
DRSG PAD ABDOMINAL 8X10 ST (GAUZE/BANDAGES/DRESSINGS) ×1 IMPLANT
GLOVE BIOGEL M STRL SZ7.5 (GLOVE) ×2 IMPLANT
GLOVE BIOGEL PI IND STRL 7.0 (GLOVE) ×1 IMPLANT
GLOVE BIOGEL PI IND STRL 7.5 (GLOVE) IMPLANT
GLOVE BIOGEL PI INDICATOR 7.0 (GLOVE)
GLOVE BIOGEL PI INDICATOR 7.5 (GLOVE) ×2
GLOVE INDICATOR 8.0 STRL GRN (GLOVE) ×1 IMPLANT
GLOVE SS BIOGEL STRL SZ 7.5 (GLOVE) IMPLANT
GLOVE SUPERSENSE BIOGEL SZ 7.5 (GLOVE) ×1
GLOVE SURG SS PI 7.5 STRL IVOR (GLOVE) ×1 IMPLANT
GOWN STRL NON-REIN LRG LVL3 (GOWN DISPOSABLE) ×1 IMPLANT
GOWN STRL REIN XL XLG (GOWN DISPOSABLE) ×4 IMPLANT
KIT BASIN OR (CUSTOM PROCEDURE TRAY) ×1 IMPLANT
LIGASURE IMPACT 36 18CM CVD LR (INSTRUMENTS) ×1 IMPLANT
PACK GENERAL/GYN (CUSTOM PROCEDURE TRAY) ×1 IMPLANT
SPONGE GAUZE 4X4 12PLY (GAUZE/BANDAGES/DRESSINGS) ×1 IMPLANT
STAPLER GUN LINEAR PROX 60 (STAPLE) ×1 IMPLANT
STAPLER PROXIMATE 75MM BLUE (STAPLE) ×1 IMPLANT
STAPLER VISISTAT 35W (STAPLE) ×1 IMPLANT
SUT NOVA 1 T20/GS 25DT (SUTURE) ×2 IMPLANT
SUT SILK 2 0 (SUTURE) ×2
SUT SILK 2 0 SH CR/8 (SUTURE) ×1 IMPLANT
SUT SILK 2-0 18XBRD TIE 12 (SUTURE) IMPLANT
TAPE CLOTH SURG 4X10 WHT LF (GAUZE/BANDAGES/DRESSINGS) ×1 IMPLANT
TOWEL OR 17X26 10 PK STRL BLUE (TOWEL DISPOSABLE) ×2 IMPLANT
TOWEL OR NON WOVEN STRL DISP B (DISPOSABLE) ×1 IMPLANT

## 2011-05-11 NOTE — ED Notes (Signed)
Pt transported to OR via stretcher  

## 2011-05-11 NOTE — Preoperative (Signed)
Beta Blockers   Reason not to administer Beta Blockers:Not Applicable 

## 2011-05-11 NOTE — Transfer of Care (Signed)
Immediate Anesthesia Transfer of Care Note  Patient: [name]  Procedure(s) Performed:  HERNIA REPAIR INCISIONAL  Patient Location: PACU  Anesthesia Type: General  Level of Consciousness: awake, oriented, patient cooperative and responds to stimulation  Airway & Oxygen Therapy: Patient Spontanous Breathing, Patient connected to face mask oxygen and Patient connected to face mask  Post-op Assessment: Report given to PACU RN, Post -op Vital signs reviewed and stable and Patient moving all extremities X 4  Post vital signs: Reviewed and stable  Complications: No apparent anesthesia complications

## 2011-05-11 NOTE — Anesthesia Postprocedure Evaluation (Signed)
  Anesthesia Post-op Note  Patient: [name]  Procedure(s) Performed:  HERNIA REPAIR INCISIONAL  Patient Location: PACU  Anesthesia Type: General  Level of Consciousness: awake, alert  and oriented  Airway and Oxygen Therapy: Patient Spontanous Breathing  Post-op Pain: mild  Post-op Assessment: Post-op Vital signs reviewed, Respiratory Function Stable, No signs of Nausea or vomiting and Pain level controlled  Post-op Vital Signs: Reviewed  Complications: No apparent anesthesia complications

## 2011-05-11 NOTE — ED Notes (Signed)
Woke up this am with abdominal pain, has a hard area around umbilicus
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
## 2011-05-11 NOTE — Anesthesia Preprocedure Evaluation (Addendum)
Anesthesia Evaluation  Patient identified by MRN, date of birth, ID band Patient awake    Reviewed: Allergy & Precautions, H&P , NPO status , Patient's Chart, lab work & pertinent test results  Airway  TM Distance: >3 FB     Dental  (+) Teeth Intact, Poor Dentition and Dental Advisory Given   Pulmonary Current Smoker,  + rhonchi        Cardiovascular hypertension, Pt. on medications Regular Normal    Neuro/Psych    GI/Hepatic GERD-  ,(+)     substance abuse  alcohol use,   Endo/Other  Diabetes mellitus-, Type 2, Oral Hypoglycemic Agents  Renal/GU negative Renal ROS     Musculoskeletal negative musculoskeletal ROS (+)   Abdominal (+)  Abdomen: rigid.    Peds  Hematology negative hematology ROS (+)   Anesthesia Other Findings   Reproductive/Obstetrics negative OB ROS                           Anesthesia Physical Anesthesia Plan  ASA: II and Emergent  Anesthesia Plan: General   Post-op Pain Management:    Induction: Intravenous  Airway Management Planned: Oral ETT  Additional Equipment:   Intra-op Plan:   Post-operative Plan: Extubation in OR  Informed Consent: I have reviewed the patients History and Physical, chart, labs and discussed the procedure including the risks, benefits and alternatives for the proposed anesthesia with the patient or authorized representative who has indicated his/her understanding and acceptance.   Dental advisory given  Plan Discussed with: CRNA and Surgeon  Anesthesia Plan Comments:        Anesthesia Quick Evaluation

## 2011-05-11 NOTE — ED Notes (Signed)
Pt. Given swabs for dry mouth, acknowledges is NPO.

## 2011-05-11 NOTE — Op Note (Signed)
  Surgeon: Glenna Fellows T   Assistants: Gaynelle Adu M.D.  Anesthesia: General endotracheal anesthesia  Indications: incarcerated ventral hernia with small bowel obstruction    Procedure Detail Patient was brought to the operating room and general endotracheal anesthesia induced. He had received preoperative broad-spectrum IV antibiotics antiembolism stockings were in place. The abdomen was widely sterilely prepped and draped and correct patient and procedure verified. The previous midline incision skirting the umbilicus near the incarcerated hernia mass was used and dissection carried down through the subcutaneous tissue. The hernia sac was completely dissected away from surrounding soft tissue down to the level of the fascia. The hernia sac contained bloody fluid and it was opened. There was an approximately 7 or 8 cm loop of necrotic small intestine. The fascia was opened a short distance superiorly and inferiorly to release the bowel. There was a fatty hernia sac that was incised back to the edge of the fascia. There were no adhesions to the nearby anterior abdominal wall. I could feel a relatively small smooth hernia defect about 10 cm more superiorly in the incision. I elected not to repair that as I could not use mesh in the presence of necrotic bowel. The small intestine proximal and distal was normal. I then proceeded with resection. The mesentery of the involved segment was divided with the LigaSure. A functional end to end anastomosis was then created with the 75 mm GIA stapler and the common enterotomy was closed and the specimen removed with a single firing of the 60 mm blue load linear stapler. The crotch of the anastomosis was reinforced and the mesentery was closed with interrupted 2-0 silk. The viscera returned to the abdomen. The defect in the fascia measured no more than 4 cm relatively healthy-appearing fascia. Short skin and subcutaneous flaps were raised and the fascia was  closed primarily with minimal tension using 0 Novafil. The wound was thoroughly irrigated. The skin was closed with staples using 2 Teflon wicks.   Findings:incarcerated small intestine with a short segment of necrosis  Estimated Blood Loss:  Minimal         Drains: None         Total IV Fluids: 1000 ml  Blood Given: none          Specimens: Small bowel and hernia sac        Complications:  * No complications entered in OR log *         Disposition: PACU - hemodynamically stable.         Condition: stable  Mariella Saa MD, FACS  05/11/2011, 9:51 PM

## 2011-05-11 NOTE — ED Notes (Signed)
Patient is resting comfortably. 

## 2011-05-11 NOTE — H&P (Signed)
I have seen and examined [name].  He has an incarcerated ventral incisional hernia.  I could not reduce it.  He feels much better after pain medication.  Had a small BM today and has passed a lot of gas.  I recommended he have urgent repair of the hernia.  I discussed the procedure and risks with him as well as the use of synthetic or biologic mesh.  Risks include but are not limited to bleeding, infection, wound healing problems, anesthesia, recurrence, accidental injury to intra-abdominal organs-such as intestine, liver, spleen, bladder, etc. We also discussed the rare complication of mesh rejection. All questions were answered.

## 2011-05-11 NOTE — Anesthesia Procedure Notes (Signed)
Performed by: Alfredo Bach

## 2011-05-11 NOTE — ED Notes (Signed)
Pt ready for surgery. OR called and ready for pt.

## 2011-05-12 ENCOUNTER — Other Ambulatory Visit (INDEPENDENT_AMBULATORY_CARE_PROVIDER_SITE_OTHER): Payer: Self-pay | Admitting: General Surgery

## 2011-05-12 DIAGNOSIS — E119 Type 2 diabetes mellitus without complications: Secondary | ICD-10-CM

## 2011-05-12 LAB — GLUCOSE, CAPILLARY
Glucose-Capillary: 128 mg/dL — ABNORMAL HIGH (ref 70–99)
Glucose-Capillary: 156 mg/dL — ABNORMAL HIGH (ref 70–99)
Glucose-Capillary: 141 mg/dL — ABNORMAL HIGH (ref 70–99)
Glucose-Capillary: 126 mg/dL — ABNORMAL HIGH (ref 70–99)
Glucose-Capillary: 107 mg/dL — ABNORMAL HIGH (ref 70–99)
Glucose-Capillary: 119 mg/dL — ABNORMAL HIGH (ref 70–99)

## 2011-05-12 MED ORDER — ALUM & MAG HYDROXIDE-SIMETH 200-200-20 MG/5ML PO SUSP
ORAL | Status: AC
  Administered 2011-05-13: 30 mL via ORAL
  Filled 2011-05-12: qty 30

## 2011-05-12 MED ORDER — DIPHENHYDRAMINE HCL 12.5 MG/5ML PO ELIX
12.5000 mg | ORAL_SOLUTION | Freq: Four times a day (QID) | ORAL | Status: DC | PRN
  Filled 2011-05-12: qty 5

## 2011-05-12 MED ORDER — ENOXAPARIN SODIUM 40 MG/0.4ML ~~LOC~~ SOLN
40.0000 mg | SUBCUTANEOUS | Status: DC
  Administered 2011-05-12 – 2011-05-13 (×2): 40 mg via SUBCUTANEOUS
  Filled 2011-05-12 (×3): qty 0.4

## 2011-05-12 MED ORDER — DIPHENHYDRAMINE HCL 50 MG/ML IJ SOLN: 12.5000 mg | Freq: Four times a day (QID) | INTRAMUSCULAR | Status: DC | PRN

## 2011-05-12 MED ORDER — NALOXONE HCL 0.4 MG/ML IJ SOLN: 0.4000 mg | INTRAMUSCULAR | Status: DC | PRN

## 2011-05-12 MED ORDER — SODIUM CHLORIDE 0.9 % IJ SOLN: 9.0000 mL | INTRAMUSCULAR | Status: DC | PRN

## 2011-05-12 MED ORDER — DIPHENHYDRAMINE HCL 12.5 MG/5ML PO ELIX: 12.5000 mg | ORAL_SOLUTION | Freq: Four times a day (QID) | ORAL | Status: DC | PRN

## 2011-05-12 MED ORDER — MENTHOL 3 MG MT LOZG
1.0000 | LOZENGE | OROMUCOSAL | Status: DC | PRN
  Filled 2011-05-12: qty 9

## 2011-05-12 MED ORDER — MORPHINE SULFATE (PF) 1 MG/ML IV SOLN
INTRAVENOUS | Status: DC
  Administered 2011-05-12: 6 mg via INTRAVENOUS
  Administered 2011-05-12 (×2): 1.5 mg via INTRAVENOUS
  Administered 2011-05-12 (×2): 4.5 mg via INTRAVENOUS
  Administered 2011-05-12: 3 mg via INTRAVENOUS
  Administered 2011-05-13: 4.5 mg via INTRAVENOUS
  Administered 2011-05-13: 6 mg via INTRAVENOUS
  Administered 2011-05-13: 0.6 mg via INTRAVENOUS
  Administered 2011-05-13: 3 mg via INTRAVENOUS
  Administered 2011-05-13: 22:00:00 via INTRAVENOUS
  Administered 2011-05-13: 3 mg via INTRAVENOUS
  Administered 2011-05-13: 6 mg via INTRAVENOUS
  Administered 2011-05-13: 1.5 mg via INTRAVENOUS
  Administered 2011-05-13: 1 mg via INTRAVENOUS
  Administered 2011-05-14 (×3): 3 mg via INTRAVENOUS
  Administered 2011-05-14: 1.5 mg via INTRAVENOUS
  Administered 2011-05-15: 6 mg via INTRAVENOUS
  Administered 2011-05-15: 9.3 mg via INTRAVENOUS
  Administered 2011-05-15: 7.11 mg via INTRAVENOUS
  Administered 2011-05-15: 1.5 mg via INTRAVENOUS
  Administered 2011-05-15: 9 mg via INTRAVENOUS
  Administered 2011-05-15: 7.5 mg via INTRAVENOUS
  Administered 2011-05-16: 3 mg via INTRAVENOUS
  Administered 2011-05-16: 25 mg via INTRAVENOUS
  Administered 2011-05-16: 3 mg via INTRAVENOUS
  Administered 2011-05-16: 9.5 mg via INTRAVENOUS
  Administered 2011-05-17: 6 mg via INTRAVENOUS
  Administered 2011-05-17: 3 mg via INTRAVENOUS
  Administered 2011-05-17: via INTRAVENOUS
  Administered 2011-05-17: 9 mg via INTRAVENOUS
  Administered 2011-05-17: 4.5 mg via INTRAVENOUS
  Administered 2011-05-17: 3 mg via INTRAVENOUS
  Administered 2011-05-17: 1.5 mg via INTRAVENOUS
  Administered 2011-05-18: 4.5 mg via INTRAVENOUS
  Administered 2011-05-18: 7.5 mg via INTRAVENOUS
  Administered 2011-05-18: 1.5 mg via INTRAVENOUS
  Administered 2011-05-18: 3 mg via INTRAVENOUS
  Filled 2011-05-12 (×7): qty 30

## 2011-05-12 MED ORDER — INFLUENZA VIRUS VACC SPLIT PF IM SUSP
0.5000 mL | Freq: Once | INTRAMUSCULAR | Status: AC
  Administered 2011-05-12: 0.5 mL via INTRAMUSCULAR
  Filled 2011-05-12: qty 0.5

## 2011-05-12 MED ORDER — ALUM & MAG HYDROXIDE-SIMETH 200-200-20 MG/5ML PO SUSP
30.0000 mL | ORAL | Status: DC | PRN
  Administered 2011-05-12 – 2011-05-17 (×5): 30 mL via ORAL
  Filled 2011-05-12 (×4): qty 30

## 2011-05-12 MED ORDER — INSULIN ASPART 100 UNIT/ML ~~LOC~~ SOLN
0.0000 [IU] | SUBCUTANEOUS | Status: DC
  Administered 2011-05-15: 1 [IU] via SUBCUTANEOUS
  Administered 2011-05-16: 3 [IU] via SUBCUTANEOUS
  Filled 2011-05-12: qty 3

## 2011-05-12 MED ORDER — ONDANSETRON HCL 4 MG/2ML IJ SOLN
4.0000 mg | Freq: Four times a day (QID) | INTRAMUSCULAR | Status: DC | PRN
  Filled 2011-05-12: qty 2

## 2011-05-12 MED ORDER — PANTOPRAZOLE SODIUM 40 MG IV SOLR
40.0000 mg | INTRAVENOUS | Status: DC
  Administered 2011-05-12 – 2011-05-16 (×5): 40 mg via INTRAVENOUS
  Filled 2011-05-12 (×6): qty 40

## 2011-05-12 MED ORDER — MORPHINE SULFATE (PF) 1 MG/ML IV SOLN: INTRAVENOUS | Status: DC

## 2011-05-12 NOTE — Progress Notes (Signed)
Pt seen and examined. Agree with KB's note. He c/o some heartburn. Operative findings discussed with him. Will start IS and ice chips.

## 2011-05-12 NOTE — Progress Notes (Signed)
Ng tube to lt nare, draining scant amt of bloody-brown liq
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
## 2011-05-13 LAB — GLUCOSE, CAPILLARY
Glucose-Capillary: 103 mg/dL — ABNORMAL HIGH (ref 70–99)
Glucose-Capillary: 112 mg/dL — ABNORMAL HIGH (ref 70–99)
Glucose-Capillary: 113 mg/dL — ABNORMAL HIGH (ref 70–99)
Glucose-Capillary: 119 mg/dL — ABNORMAL HIGH (ref 70–99)
Glucose-Capillary: 80 mg/dL (ref 70–99)
Glucose-Capillary: 95 mg/dL (ref 70–99)

## 2011-05-13 LAB — CBC
HCT: 37 % — ABNORMAL LOW (ref 39.0–52.0)
Hemoglobin: 12.2 g/dL — ABNORMAL LOW (ref 13.0–17.0)
MCH: 33.1 pg (ref 26.0–34.0)
MCHC: 33 g/dL (ref 30.0–36.0)
MCV: 100.3 fL — ABNORMAL HIGH (ref 78.0–100.0)
Platelets: 160 K/uL (ref 150–400)
RBC: 3.69 MIL/uL — ABNORMAL LOW (ref 4.22–5.81)
RDW: 12.7 % (ref 11.5–15.5)
WBC: 7.9 K/uL (ref 4.0–10.5)

## 2011-05-13 LAB — BASIC METABOLIC PANEL
BUN: 7 mg/dL (ref 6–23)
CO2: 25 mEq/L (ref 19–32)
Calcium: 8.7 mg/dL (ref 8.4–10.5)
Chloride: 100 mEq/L (ref 96–112)
Creatinine, Ser: 0.75 mg/dL (ref 0.50–1.35)
GFR calc Af Amer: >90 mL/min (ref >90)
GFR calc non Af Amer: >90 mL/min (ref >90)
Glucose, Bld: 109 mg/dL — ABNORMAL HIGH (ref 70–99)
Potassium: 3.5 mEq/L (ref 3.5–5.1)
Sodium: 135 mEq/L (ref 135–145)

## 2011-05-13 LAB — DIFFERENTIAL
Basophils Absolute: 0 K/uL (ref 0.0–0.1)
Basophils Relative: 0 % (ref 0–1)
Eosinophils Absolute: 0.2 K/uL (ref 0.0–0.7)
Eosinophils Relative: 2 % (ref 0–5)
Lymphocytes Relative: 21 % (ref 12–46)
Lymphs Abs: 1.6 K/uL (ref 0.7–4.0)
Monocytes Absolute: 0.6 K/uL (ref 0.1–1.0)
Monocytes Relative: 8 % (ref 3–12)
Neutro Abs: 5.5 K/uL (ref 1.7–7.7)
Neutrophils Relative %: 70 % (ref 43–77)
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
## 2011-05-14 LAB — GLUCOSE, CAPILLARY
Glucose-Capillary: 76 mg/dL (ref 70–99)
Glucose-Capillary: 81 mg/dL (ref 70–99)
Glucose-Capillary: 83 mg/dL (ref 70–99)
Glucose-Capillary: 86 mg/dL (ref 70–99)
Glucose-Capillary: 91 mg/dL (ref 70–99)
Glucose-Capillary: 93 mg/dL (ref 70–99)
Glucose-Capillary: 99 mg/dL (ref 70–99)

## 2011-05-14 MED ORDER — ONDANSETRON HCL 4 MG/2ML IJ SOLN: 4.0000 mg | Freq: Four times a day (QID) | INTRAMUSCULAR | Status: DC | PRN

## 2011-05-14 MED ORDER — PHENOL 1.4 % MT LIQD
1.0000 | OROMUCOSAL | Status: DC | PRN
  Filled 2011-05-14: qty 177

## 2011-05-14 NOTE — Progress Notes (Signed)
3 Days Post-Op Doing better. C/o sore throat.  Passing some flatus this am, thinks may have to have a BM Pain control adequate. Voiding well.  Objective: Vital signs in last 24 hours: Temp:  [98.2 F (36.8 C)-99.6 F (37.6 C)] 98.2 F (36.8 C) (11/09 0530) Pulse Rate:  [65-77] 65  (11/09 0530) Resp:  [18-20] 18  (11/09 0530) BP: (137-143)/(80-85) 140/81 mmHg (11/09 0530) SpO2:  [92 %-97 %] 95 % (11/09 0530)    Intake/Output this shift: Total I/O In: 895.8 [I.V.:895.8] Out: -   Physical Exam: Abdomen soft, ND. Mildly tender. Incis c/d/i, wicks removed. Active BS  Labs: CBC  Basename 05/13/11 0410 05/11/11 1242  WBC 7.9 4.5  HGB 12.2* 13.4  HCT 37.0* 40.2  PLT 160 147*   BMET  Basename 05/13/11 0410 05/11/11 1242  NA 135 132*  K 3.5 4.2  CL 100 97  CO2 25 23  GLUCOSE 109* 162*  BUN 7 13  CREATININE 0.75 0.77  CALCIUM 8.7 9.6   PT/INR No results found for this basename: LABPROT:2,INR:2 in the last 72 hours ABG No results found for this basename: PHART:2,PCO2:2,PO2:2,HCO3:2 in the last 72 hours  Studies/Results: No results found.   Assessment: Principal Problem:  *Incarcerated ventral hernia Active Problems:  Diabetes mellitus  Hypertension  s/p Procedure(s): HERNIA REPAIR INCISIONAL with partial SBR  Plan: Clamp NG Increase activity.  LOS: 3 days    Marianna Fuss 05/14/2011

## 2011-05-14 NOTE — Plan of Care (Signed)
Problem: Phase III Progression Outcomes Goal: Nasogastric tube discontinued Outcome: Progressing ngt clamped

## 2011-05-14 NOTE — Progress Notes (Signed)
Pt seen and examined. He's had some bleeding from his incision. We'll stop the Lovenox. Continue SCDs and increase ambulation.

## 2011-05-15 LAB — GLUCOSE, CAPILLARY
Glucose-Capillary: 104 mg/dL — ABNORMAL HIGH (ref 70–99)
Glucose-Capillary: 129 mg/dL — ABNORMAL HIGH (ref 70–99)
Glucose-Capillary: 92 mg/dL (ref 70–99)
Glucose-Capillary: 110 mg/dL — ABNORMAL HIGH (ref 70–99)

## 2011-05-15 MED ORDER — COLCHICINE 0.6 MG PO TABS
0.6000 mg | ORAL_TABLET | Freq: Two times a day (BID) | ORAL | Status: DC
  Administered 2011-05-15 – 2011-05-17 (×5): 0.6 mg via ORAL
  Filled 2011-05-15 (×7): qty 1

## 2011-05-15 MED ORDER — ALLOPURINOL 300 MG PO TABS
300.0000 mg | ORAL_TABLET | Freq: Every day | ORAL | Status: DC
  Administered 2011-05-15 – 2011-05-19 (×5): 300 mg via ORAL
  Filled 2011-05-15 (×5): qty 1

## 2011-05-15 NOTE — Progress Notes (Signed)
4 Days Post-Op  Subjective: Passing a lot of gas.  He c/o gout pain in left knee.  Objective: Vital signs in last 24 hours: Temp:  [98.2 F (36.8 C)-99.1 F (37.3 C)] 99.1 F (37.3 C) (11/10 0534) Pulse Rate:  [67-78] 78  (11/10 0534) Resp:  [16-20] 18  (11/10 0800) BP: (143-154)/(80-94) 148/80 mmHg (11/10 0534) SpO2:  [95 %-96 %] 95 % (11/10 0800) Last BM Date: 05/10/11  Intake/Output from previous day: 11/09 0701 - 11/10 0700 In: 3329 [I.V.:3229; NG/GT:100] Out: 2925 [Urine:2925] Intake/Output this shift: Total I/O In: -  Out: 400 [Urine:400]  PE: Abd-soft, incision clean and dry  Extr-left knee with warmth, tenderness and swelling Lab Results:   [facility] 05/13/11 0410  WBC 7.9  HGB 12.2*  HCT 37.0*  PLT 160   BMET  Basename 05/13/11 0410  NA 135  K 3.5  CL 100  CO2 25  GLUCOSE 109*  BUN 7  CREATININE 0.75  CALCIUM 8.7   PT/INR No results found for this basename: LABPROT:2,INR:2 in the last 72 hours ABG No results found for this basename: PHART:2,PCO2:2,PO2:2,HCO3:2 in the last 72 hours  Studies/Results: No results found.  Anti-infectives: Anti-infectives     Start     Dose/Rate Route Frequency Ordered Stop   05/11/11 2031   ceFAZolin (ANCEF) 1-5 GM-% IVPB  Status:  Discontinued     Comments: GRAY, JOANNE: cabinet override         05/11/11 2031 05/11/11 2245   05/11/11 1330   ceFAZolin (ANCEF) IVPB 1 g/50 mL premix        1 g 100 mL/hr over 30 Minutes Intravenous  Once 05/11/11 1327 05/11/11 1400          Assessment Principal Problem:  *Incarcerated ventral hernias/p SB resection and hernia repair-bowel function returning Active Problems:  Diabetes mellitus  Hypertension Acute Gout of left knee   LOS: 4 days   Plan: D/C ng tube.  Sips of clear liquids. Start Colchicine and Allopurinol   Onix Jumper J 05/15/2011

## 2011-05-16 ENCOUNTER — Encounter (HOSPITAL_COMMUNITY): Payer: Self-pay | Admitting: General Surgery

## 2011-05-16 LAB — GLUCOSE, CAPILLARY
Glucose-Capillary: 103 mg/dL — ABNORMAL HIGH (ref 70–99)
Glucose-Capillary: 106 mg/dL — ABNORMAL HIGH (ref 70–99)
Glucose-Capillary: 116 mg/dL — ABNORMAL HIGH (ref 70–99)
Glucose-Capillary: 201 mg/dL — ABNORMAL HIGH (ref 70–99)
Glucose-Capillary: 108 mg/dL — ABNORMAL HIGH (ref 70–99)
Glucose-Capillary: 110 mg/dL — ABNORMAL HIGH (ref 70–99)
Glucose-Capillary: 117 mg/dL — ABNORMAL HIGH (ref 70–99)

## 2011-05-16 MED ORDER — INSULIN ASPART 100 UNIT/ML ~~LOC~~ SOLN
0.0000 [IU] | Freq: Three times a day (TID) | SUBCUTANEOUS | Status: DC
  Administered 2011-05-19: 1 [IU] via SUBCUTANEOUS

## 2011-05-16 MED ORDER — INSULIN ASPART 100 UNIT/ML ~~LOC~~ SOLN: 0.0000 [IU] | Freq: Every day | SUBCUTANEOUS | Status: DC

## 2011-05-16 NOTE — Progress Notes (Signed)
5 Days Post-Op  Subjective: Passing a lot of gas. Tolerated sips of clears.  Still with some pain and swelling in left knee.  Objective: Vital signs in last 24 hours: Temp:  [98.2 F (36.8 C)-99.2 F (37.3 C)] 98.8 F (37.1 C) (11/11 0612) Pulse Rate:  [70-80] 72  (11/11 0612) Resp:  [18-20] 20  (11/11 0814) BP: (131-147)/(84-88) 131/88 mmHg (11/11 0612) SpO2:  [93 %-97 %] 95 % (11/11 0814) Last BM Date: 05/10/11  Intake/Output from previous day: 11/10 0701 - 11/11 0700 In: 2982 [P.O.:720; I.V.:2262] Out: 2225 [Urine:2225] Intake/Output this shift: Total I/O In: 520 [P.O.:520] Out: 600 [Urine:600]  PE: Abd-soft, incision clean and dry  Extr-left knee swelling decreased some Lab Results:  No results found for this basename: WBC:2,HGB:2,HCT:2,PLT:2 in the last 72 hours BMET No results found for this basename: NA:2,K:2,CL:2,CO2:2,GLUCOSE:2,BUN:2,CREATININE:2,CALCIUM:2 in the last 72 hours PT/INR No results found for this basename: LABPROT:2,INR:2 in the last 72 hours ABG No results found for this basename: PHART:2,PCO2:2,PO2:2,HCO3:2 in the last 72 hours  Studies/Results: No results found.  Anti-infectives: Anti-infectives     Start     Dose/Rate Route Frequency Ordered Stop   05/11/11 2031   ceFAZolin (ANCEF) 1-5 GM-% IVPB  Status:  Discontinued     Comments: GRAY, JOANNE: cabinet override         05/11/11 2031 05/11/11 2245   05/11/11 1330   ceFAZolin (ANCEF) IVPB 1 g/50 mL premix        1 g 100 mL/hr over 30 Minutes Intravenous  Once 05/11/11 1327 05/11/11 1400          Assessment Principal Problem:  *Incarcerated ventral hernias/p SB resection and hernia repair-bowel function returning Active Problems:  Diabetes mellitus  Hypertension Acute Gout of left knee   LOS: 5 days   Plan: Advance diet. Continue  Colchicine and Allopurinol   Shaka Cardin J 05/16/2011

## 2011-05-17 DIAGNOSIS — M109 Gout, unspecified: Secondary | ICD-10-CM

## 2011-05-17 LAB — GLUCOSE, CAPILLARY
Glucose-Capillary: 112 mg/dL — ABNORMAL HIGH (ref 70–99)
Glucose-Capillary: 116 mg/dL — ABNORMAL HIGH (ref 70–99)
Glucose-Capillary: 101 mg/dL — ABNORMAL HIGH (ref 70–99)
Glucose-Capillary: 107 mg/dL — ABNORMAL HIGH (ref 70–99)

## 2011-05-17 LAB — URIC ACID: Uric Acid, Serum: 5.2 mg/dL (ref 4.0–7.8)

## 2011-05-17 MED ORDER — ACETAMINOPHEN 325 MG PO TABS: 650.0000 mg | ORAL_TABLET | ORAL | Status: DC | PRN

## 2011-05-17 MED ORDER — COLCHICINE 0.6 MG PO TABS
0.6000 mg | ORAL_TABLET | Freq: Three times a day (TID) | ORAL | Status: DC
  Administered 2011-05-17 – 2011-05-19 (×5): 0.6 mg via ORAL
  Filled 2011-05-17 (×8): qty 1

## 2011-05-17 MED ORDER — PANTOPRAZOLE SODIUM 40 MG PO TBEC
40.0000 mg | DELAYED_RELEASE_TABLET | Freq: Every day | ORAL | Status: DC
  Administered 2011-05-18 – 2011-05-19 (×2): 40 mg via ORAL
  Filled 2011-05-17 (×4): qty 1

## 2011-05-17 MED ORDER — OXYCODONE-ACETAMINOPHEN 5-325 MG PO TABS
1.0000 | ORAL_TABLET | ORAL | Status: DC | PRN
  Administered 2011-05-18 (×2): 2 via ORAL
  Filled 2011-05-17 (×2): qty 2

## 2011-05-17 NOTE — Progress Notes (Signed)
6 Days Post-Op  Subjective:Incarcerated VH with SBO and necrosis   Sitting up in bed, regular diet.  Had a BM today.  Gout in left Knee and foot major issue now.  Objective: Vital signs in last 24 hours: Temp:  [98.2 F (36.8 C)-98.3 F (36.8 C)] 98.2 F (36.8 C) (11/12 0600) Pulse Rate:  [65-75] 65  (11/12 0600) Resp:  [16-18] 16  (11/12 0600) BP: (111-114)/(68-76) 114/76 mmHg (11/12 0600) SpO2:  [94 %-95 %] 94 % (11/12 0600) Last BM Date: 05/10/11  Intake/Output from previous day: 11/11 0701 - 11/12 0700 In: 1584.2 [P.O.:520; I.V.:1064.2] Out: 2625 [Urine:2625] Intake/Output this shift: Total I/O In: -  Out: 1050 [Urine:1050]  General appearance: alert, cooperative and no distress Resp: clear to auscultation bilaterally GI: soft, non-tender; bowel sounds normal; no masses,  no organomegaly and Incision looks good.  Extremities: extremities normal, atraumatic, no cyanosis or edema and Left knee swollen, left foot pain although he doesn't really look swollen here.  Lab Results:  No results found for this basename: WBC:2,HGB:2,HCT:2,PLT:2 in the last 72 hours  BMET No results found for this basename: NA:2,K:2,CL:2,CO2:2,GLUCOSE:2,BUN:2,CREATININE:2,CALCIUM:2 in the last 72 hours PT/INR No results found for this basename: LABPROT:2,INR:2 in the last 72 hours   Studies/Results: No results found.  Anti-infectives: Anti-infectives     Start     Dose/Rate Route Frequency Ordered Stop   05/11/11 2031   ceFAZolin (ANCEF) 1-5 GM-% IVPB  Status:  Discontinued     Comments: [name], [name]: cabinet override         05/11/11 2031 05/11/11 2245   05/11/11 1330   ceFAZolin (ANCEF) IVPB 1 g/50 mL premix        1 g 100 mL/hr over 30 Minutes Intravenous  Once 05/11/11 1327 05/11/11 1400         Current Facility-Administered Medications  Medication Dose Route Frequency Provider Last Rate Last Dose  . 0.9 % NaCl with KCl 20 mEq/ L  infusion   Intravenous Continuous Adolph Pollack, MD 50 mL/hr at 05/17/11 0126    . acetaminophen (TYLENOL) suppository 650 mg  650 mg Rectal Q6H PRN Marianna Fuss, PA      . allopurinol (ZYLOPRIM) tablet 300 mg  300 mg Oral Daily Adolph Pollack, MD   300 mg at 05/17/11 1035  . alum & mag hydroxide-simeth (MAALOX/MYLANTA) 200-200-20 MG/5ML suspension 30 mL  30 mL Oral Q4H PRN Adolph Pollack, MD   30 mL at 05/17/11 1040  . colchicine tablet 0.6 mg  0.6 mg Oral BID Adolph Pollack, MD   0.6 mg at 05/17/11 1035  . diphenhydrAMINE (BENADRYL) injection 12.5 mg  12.5 mg Intravenous Q6H PRN Mariella Saa, MD       Or  . diphenhydrAMINE (BENADRYL) 12.5 MG/5ML elixir 12.5 mg  12.5 mg Oral Q6H PRN Mariella Saa, MD      . insulin aspart (novoLOG) injection 0-5 Units  0-5 Units Subcutaneous QHS Rulon Abide, DO      . insulin aspart (novoLOG) injection 0-9 Units  0-9 Units Subcutaneous TID WC Rulon Abide, DO      . menthol-cetylpyridinium (CEPACOL) lozenge 3 mg  1 lozenge Oral PRN Valarie Merino, MD      . morphine 1 MG/ML PCA injection   Intravenous Q4H Mariella Saa, MD   4.5 mg at 05/17/11 0800  . naloxone [facility]) injection 0.4 mg  0.4 mg Intravenous PRN Mariella Saa, MD  And  . sodium chloride 0.9 % injection 9 mL  9 mL Intravenous PRN Mariella Saa, MD      . ondansetron [facility]) injection 4 mg  4 mg Intravenous Q6H PRN Adolph Pollack, MD      . pantoprazole (PROTONIX) injection 40 mg  40 mg Intravenous Q24H Adolph Pollack, MD   40 mg at 05/16/11 2217  . phenol (CHLORASEPTIC) mouth spray 1 spray  1 spray Mouth/Throat PRN Marianna Fuss, PA        Assessment/Plan 1. Incarcerated VH with small bowel obstruction and necrosis S/P repair and small bowel resection. 2.  Patient Active Problem List  Diagnoses  . Incarcerated ventral hernia  . Diabetes mellitus  . Hypertension  . Gout  PLAN:  Change meds to PO , recheck labs, wean off PCA, TREAT gout  Mobilize when he can  bear weight.     LOS: 6 days    [name],[name] 05/17/2011

## 2011-05-17 NOTE — Plan of Care (Signed)
Problem: Phase III Progression Outcomes Goal: Pain controlled on oral analgesia Pt stating mod pain in bil knees and feet- doing ankle pumps and using pca as needed

## 2011-05-18 LAB — GLUCOSE, CAPILLARY
Glucose-Capillary: 109 mg/dL — ABNORMAL HIGH (ref 70–99)
Glucose-Capillary: 107 mg/dL — ABNORMAL HIGH (ref 70–99)
Glucose-Capillary: 116 mg/dL — ABNORMAL HIGH (ref 70–99)
Glucose-Capillary: 126 mg/dL — ABNORMAL HIGH (ref 70–99)

## 2011-05-18 LAB — COMPREHENSIVE METABOLIC PANEL
ALT: 63 U/L — ABNORMAL HIGH (ref 0–53)
AST: 51 U/L — ABNORMAL HIGH (ref 0–37)
Albumin: 3.1 g/dL — ABNORMAL LOW (ref 3.5–5.2)
Alkaline Phosphatase: 183 U/L — ABNORMAL HIGH (ref 39–117)
BUN: 15 mg/dL (ref 6–23)
CO2: 25 mEq/L (ref 19–32)
Calcium: 9.6 mg/dL (ref 8.4–10.5)
Chloride: 95 mEq/L — ABNORMAL LOW (ref 96–112)
Creatinine, Ser: 0.91 mg/dL (ref 0.50–1.35)
GFR calc Af Amer: >90 mL/min (ref >90)
GFR calc non Af Amer: >90 mL/min (ref >90)
Glucose, Bld: 112 mg/dL — ABNORMAL HIGH (ref 70–99)
Potassium: 4.1 mEq/L (ref 3.5–5.1)
Sodium: 130 mEq/L — ABNORMAL LOW (ref 135–145)
Total Bilirubin: 0.3 mg/dL (ref 0.3–1.2)
Total Protein: 7.8 g/dL (ref 6.0–8.3)

## 2011-05-18 LAB — CBC
HCT: 32.3 % — ABNORMAL LOW (ref 39.0–52.0)
Hemoglobin: 10.9 g/dL — ABNORMAL LOW (ref 13.0–17.0)
MCH: 32.5 pg (ref 26.0–34.0)
MCHC: 33.7 g/dL (ref 30.0–36.0)
MCV: 96.4 fL (ref 78.0–100.0)
Platelets: 227 10*3/uL (ref 150–400)
RBC: 3.35 MIL/uL — ABNORMAL LOW (ref 4.22–5.81)
RDW: 12.3 % (ref 11.5–15.5)
WBC: 6 10*3/uL (ref 4.0–10.5)

## 2011-05-18 LAB — MAGNESIUM: Magnesium: 1.8 mg/dL (ref 1.5–2.5)

## 2011-05-18 MED ORDER — INDOMETHACIN 50 MG PO CAPS
50.0000 mg | ORAL_CAPSULE | Freq: Once | ORAL | Status: AC
  Administered 2011-05-18: 50 mg via ORAL
  Filled 2011-05-18: qty 1

## 2011-05-18 MED ORDER — GEMFIBROZIL 600 MG PO TABS
600.0000 mg | ORAL_TABLET | Freq: Two times a day (BID) | ORAL | Status: DC
  Administered 2011-05-18 – 2011-05-19 (×2): 600 mg via ORAL
  Filled 2011-05-18 (×3): qty 1

## 2011-05-18 MED ORDER — AMLODIPINE BESYLATE 10 MG PO TABS
10.0000 mg | ORAL_TABLET | Freq: Every day | ORAL | Status: DC
  Administered 2011-05-18 – 2011-05-19 (×2): 10 mg via ORAL
  Filled 2011-05-18 (×2): qty 1

## 2011-05-18 MED ORDER — METFORMIN HCL 500 MG PO TABS
500.0000 mg | ORAL_TABLET | Freq: Two times a day (BID) | ORAL | Status: DC
Start: 2011-05-18 — End: 2011-05-19
  Administered 2011-05-18 – 2011-05-19 (×2): 500 mg via ORAL
  Filled 2011-05-18 (×3): qty 1

## 2011-05-18 MED ORDER — ALBUTEROL SULFATE HFA 108 (90 BASE) MCG/ACT IN AERS
2.0000 | INHALATION_SPRAY | Freq: Four times a day (QID) | RESPIRATORY_TRACT | Status: DC | PRN
  Filled 2011-05-18: qty 6.7

## 2011-05-18 MED ORDER — ASPIRIN 81 MG PO CHEW
81.0000 mg | CHEWABLE_TABLET | Freq: Every day | ORAL | Status: DC
  Administered 2011-05-18 – 2011-05-19 (×2): 81 mg via ORAL
  Filled 2011-05-18 (×2): qty 1

## 2011-05-18 NOTE — Progress Notes (Signed)
7 Days Post-Op  Subjective: Feeling better, +BM, Was able to walk this AM. Tolerating regular diet.  Objective: Vital signs in last 24 hours: Temp:  [98.2 F (36.8 C)-99 F (37.2 C)] 98.2 F (36.8 C) (11/13 0615) Pulse Rate:  [63-76] 63  (11/13 0615) Resp:  [18-20] 18  (11/13 0615) BP: (124-133)/(76-78) 133/78 mmHg (11/13 0615) SpO2:  [95 %-98 %] 98 % (11/13 0615) Last BM Date: 05/17/11  Intake/Output from previous day: 11/12 0701 - 11/13 0700 In: 2668.8 [P.O.:840; I.V.:1828.8] Out: 3500 [Urine:3500] Intake/Output this shift:    General appearance: alert, cooperative and no distress Resp: clear to auscultation bilaterally GI: soft, non-tender; bowel sounds normal; no masses,  no organomegaly and Incision looks good Extremities: extremities normal, atraumatic, no cyanosis or edema and Less swelling at knee  Lab Results:   [facility] 05/18/11 0414  WBC 6.0  HGB 10.9*  HCT 32.3*  PLT 227    BMET  Basename 05/18/11 0414  NA 130*  K 4.1  CL 95*  CO2 25  GLUCOSE 112*  BUN 15  CREATININE 0.91  CALCIUM 9.6   PT/INR No results found for this basename: LABPROT:2,INR:2 in the last 72 hours   Studies/Results: No results found.  Anti-infectives: Anti-infectives     Start     Dose/Rate Route Frequency Ordered Stop   05/11/11 2031   ceFAZolin (ANCEF) 1-5 GM-% IVPB  Status:  Discontinued     Comments: GRAY, JOANNE: cabinet override         05/11/11 2031 05/11/11 2245   05/11/11 1330   ceFAZolin (ANCEF) IVPB 1 g/50 mL premix        1 g 100 mL/hr over 30 Minutes Intravenous  Once 05/11/11 1327 05/11/11 1400         Current Facility-Administered Medications  Medication Dose Route Frequency Provider Last Rate Last Dose  . 0.9 % NaCl with KCl 20 mEq/ L  infusion   Intravenous Continuous Adolph Pollack, MD 50 mL/hr at 05/17/11 2036    . acetaminophen (TYLENOL) suppository 650 mg  650 mg Rectal Q6H PRN Marianna Fuss, PA      . acetaminophen (TYLENOL) tablet 650  mg  650 mg Oral Q4H PRN Sherrie George, PA      . allopurinol (ZYLOPRIM) tablet 300 mg  300 mg Oral Daily Adolph Pollack, MD   300 mg at 05/18/11 0913  . alum & mag hydroxide-simeth (MAALOX/MYLANTA) 200-200-20 MG/5ML suspension 30 mL  30 mL Oral Q4H PRN Adolph Pollack, MD   30 mL at 05/17/11 1040  . colchicine tablet 0.6 mg  0.6 mg Oral TID Sherrie George, PA   0.6 mg at 05/18/11 0913  . diphenhydrAMINE (BENADRYL) injection 12.5 mg  12.5 mg Intravenous Q6H PRN Mariella Saa, MD       Or  . diphenhydrAMINE (BENADRYL) 12.5 MG/5ML elixir 12.5 mg  12.5 mg Oral Q6H PRN Mariella Saa, MD      . insulin aspart (novoLOG) injection 0-5 Units  0-5 Units Subcutaneous QHS Rulon Abide, DO      . insulin aspart (novoLOG) injection 0-9 Units  0-9 Units Subcutaneous TID WC Rulon Abide, DO      . menthol-cetylpyridinium (CEPACOL) lozenge 3 mg  1 lozenge Oral PRN Valarie Merino, MD      . morphine 1 MG/ML PCA injection   Intravenous Q4H Mariella Saa, MD   1.5 mg at 05/18/11 0800  . naloxone [facility]) injection 0.4 mg  0.4 mg Intravenous PRN Mariella Saa, MD       And  . sodium chloride 0.9 % injection 9 mL  9 mL Intravenous PRN Mariella Saa, MD      . ondansetron [facility]) injection 4 mg  4 mg Intravenous Q6H PRN Adolph Pollack, MD      . oxyCODONE-acetaminophen (PERCOCET) 5-325 MG per tablet 1-2 tablet  1-2 tablet Oral Q4H PRN Sherrie George, PA      . pantoprazole (PROTONIX) EC tablet 40 mg  40 mg Oral QAC breakfast Sherrie George, [place]   40 mg at 05/18/11 0755  . phenol (CHLORASEPTIC) mouth spray 1 spray  1 spray Mouth/Throat PRN Marianna Fuss, PA      . DISCONTD: colchicine tablet 0.6 mg  0.6 mg Oral BID Adolph Pollack, MD   0.6 mg at 05/17/11 1035  . DISCONTD: pantoprazole (PROTONIX) injection 40 mg  40 mg Intravenous Q24H Adolph Pollack, MD   40 mg at 05/16/11 2217    Assessment/Plan   1.Incarcerated VH with Small bowel Obstruction and  small bowel resection 2. Gout better Patient Active Problem List  Diagnoses  . Incarcerated ventral hernia  . Diabetes mellitus  . Hypertension  . Gout  Plan:  DC pca, oral pain med one dose of indocin, aim for d/c tomorrow.  LOS: 7 days    [name],[name] 05/18/2011

## 2011-05-19 LAB — BASIC METABOLIC PANEL
BUN: 15 mg/dL (ref 6–23)
CO2: 24 mEq/L (ref 19–32)
Calcium: 9.6 mg/dL (ref 8.4–10.5)
Chloride: 98 mEq/L (ref 96–112)
Creatinine, Ser: 0.87 mg/dL (ref 0.50–1.35)
GFR calc Af Amer: >90 mL/min (ref >90)
GFR calc non Af Amer: >90 mL/min (ref >90)
Glucose, Bld: 124 mg/dL — ABNORMAL HIGH (ref 70–99)
Potassium: 4.1 mEq/L (ref 3.5–5.1)
Sodium: 133 mEq/L — ABNORMAL LOW (ref 135–145)

## 2011-05-19 LAB — GLUCOSE, CAPILLARY
Glucose-Capillary: 121 mg/dL — ABNORMAL HIGH (ref 70–99)
Glucose-Capillary: 117 mg/dL — ABNORMAL HIGH (ref 70–99)

## 2011-05-19 MED ORDER — OXYCODONE-ACETAMINOPHEN 5-325 MG PO TABS: 1.0000 | ORAL_TABLET | ORAL | Status: AC | PRN

## 2011-05-19 MED ORDER — ACETAMINOPHEN 325 MG PO TABS: 650.0000 mg | ORAL_TABLET | ORAL | Status: AC | PRN
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
## 2011-05-19 NOTE — Progress Notes (Addendum)
8 Days Post-Op  Subjective: Incision looks good, eating regular diet, +BM, Gout resolved.  Objective: Vital signs in last 24 hours: Temp:  [97.9 F (36.6 C)-98 F (36.7 C)] 98 F (36.7 C) (11/14 0559) Pulse Rate:  [61-62] 62  (11/14 0559) Resp:  [18] 18  (11/14 0559) BP: (126-135)/(77-84) 128/80 mmHg (11/14 0559) SpO2:  [97 %-98 %] 98 % (11/14 0559) Last BM Date: 05/19/11  Intake/Output from previous day: 11/13 0701 - 11/14 0700 In: 1941.8 [P.O.:960; I.V.:981.8] Out: 1000 [Urine:1000] Intake/Output this shift: Total I/O In: 160 [P.O.:160] Out: -   General appearance: alert, cooperative and no distress Resp: clear to auscultation bilaterally GI: soft, non-tender; bowel sounds normal; no masses,  no organomegaly and Incision looks good  Lab Results:   [facility] 05/18/11 0414  WBC 6.0  HGB 10.9*  HCT 32.3*  PLT 227   BMET  Basename 05/19/11 0420 05/18/11 0414  NA 133* 130*  K 4.1 4.1  CL 98 95*  CO2 24 25  GLUCOSE 124* 112*  BUN 15 15  CREATININE 0.87 0.91  CALCIUM 9.6 9.6   PT/INR No results found for this basename: LABPROT:2,INR:2 in the last 72 hours ABG No results found for this basename: PHART:2,PCO2:2,PO2:2,HCO3:2 in the last 72 hours  Studies/Results: No results found.  Anti-infectives: Anti-infectives     Start     Dose/Rate Route Frequency Ordered Stop   05/11/11 2031   ceFAZolin (ANCEF) 1-5 GM-% IVPB  Status:  Discontinued     Comments: GRAY, JOANNE: cabinet override         05/11/11 2031 05/11/11 2245   05/11/11 1330   ceFAZolin (ANCEF) IVPB 1 g/50 mL premix        1 g 100 mL/hr over 30 Minutes Intravenous  Once 05/11/11 1327 05/11/11 1400          Assessment/Plan: s/p Procedure(s): HERNIA REPAIR INCISIONAL Discharge Sutures out next week  LOS: 8 days    Haroon Shatto 05/19/2011

## 2011-05-19 NOTE — Progress Notes (Signed)
D/C instructions reviewed w/ pt. All ques answered, no further ques. Pt d/c in w/c by NT. Pt in stable condition in possession of d/c instructions, script, and all personal belongings.

## 2011-05-24 ENCOUNTER — Ambulatory Visit (INDEPENDENT_AMBULATORY_CARE_PROVIDER_SITE_OTHER): Payer: Self-pay | Admitting: General Surgery

## 2011-05-24 DIAGNOSIS — Z4802 Encounter for removal of sutures: Secondary | ICD-10-CM

## 2011-05-24 NOTE — Progress Notes (Signed)
Patient enters the office for staple removal.The incision is well approximated, staples are removed and several steri strips are applied. Instructed the patient to continue to cleanse the area with antibacterial soap and to contact the office if he has any questions. Informed the patient to follow up with Dr Johna Sheriff on 06/10/11 at 10:45 for his post op appointment.

## 2011-06-10 ENCOUNTER — Encounter (INDEPENDENT_AMBULATORY_CARE_PROVIDER_SITE_OTHER): Payer: Self-pay | Admitting: General Surgery

## 2011-06-10 ENCOUNTER — Ambulatory Visit (INDEPENDENT_AMBULATORY_CARE_PROVIDER_SITE_OTHER): Payer: Self-pay | Admitting: General Surgery

## 2011-06-10 VITALS — BP 120/86 | HR 72 | Temp 97.4°F | Resp 18 | Ht 74.0 in | Wt 237.0 lb

## 2011-06-10 DIAGNOSIS — K436 Other and unspecified ventral hernia with obstruction, without gangrene: Secondary | ICD-10-CM

## 2011-06-10 NOTE — Progress Notes (Signed)
Patient returns now approximately 6 weeks following emergency repair of an incarcerated ventral incisional hernia with strangulate of small bowel requiring a short resection. He reports he is feeling well. He denies any pain or GI complaints.  On examination his wound is well-healed. He still has some diffuse weakness of his long midline incision and we did not attempt to do an extensive repair at the time of his incarceration due to the presence of contamination from small bowel ischemia and resection. He does not however have any discrete defects.  Assessment and plan: Doing well following emergency small bowel resection and limited hernia repair. No activity limitations. Call if he has any increasing symptoms and he is to return as needed.

## 2011-06-10 NOTE — Patient Instructions (Signed)
No limits to activity, lifting, or work.

## 2011-07-07 ENCOUNTER — Other Ambulatory Visit: Payer: Self-pay | Admitting: *Deleted

## 2011-07-07 NOTE — Telephone Encounter (Signed)
Pt called asking for refill on meds. He is not a clinic patient.  He has been informed

## 2011-07-24 ENCOUNTER — Emergency Department (HOSPITAL_COMMUNITY)
Admission: EM | Admit: 2011-07-24 | Discharge: 2011-07-24 | Disposition: A | Discharge status: Home / Self Care | Payer: Self-pay | Attending: Emergency Medicine | Admitting: Emergency Medicine

## 2011-07-24 ENCOUNTER — Encounter (HOSPITAL_COMMUNITY): Payer: Self-pay | Admitting: *Deleted

## 2011-07-24 DIAGNOSIS — K219 Gastro-esophageal reflux disease without esophagitis: Secondary | ICD-10-CM | POA: Insufficient documentation

## 2011-07-24 DIAGNOSIS — M25562 Pain in left knee: Secondary | ICD-10-CM

## 2011-07-24 DIAGNOSIS — M25469 Effusion, unspecified knee: Secondary | ICD-10-CM | POA: Insufficient documentation

## 2011-07-24 DIAGNOSIS — M109 Gout, unspecified: Secondary | ICD-10-CM | POA: Insufficient documentation

## 2011-07-24 DIAGNOSIS — E785 Hyperlipidemia, unspecified: Secondary | ICD-10-CM | POA: Insufficient documentation

## 2011-07-24 DIAGNOSIS — Z7982 Long term (current) use of aspirin: Secondary | ICD-10-CM | POA: Insufficient documentation

## 2011-07-24 DIAGNOSIS — I10 Essential (primary) hypertension: Secondary | ICD-10-CM | POA: Insufficient documentation

## 2011-07-24 DIAGNOSIS — M79609 Pain in unspecified limb: Secondary | ICD-10-CM | POA: Insufficient documentation

## 2011-07-24 DIAGNOSIS — M25569 Pain in unspecified knee: Secondary | ICD-10-CM | POA: Insufficient documentation

## 2011-07-24 DIAGNOSIS — Z79899 Other long term (current) drug therapy: Secondary | ICD-10-CM | POA: Insufficient documentation

## 2011-07-24 DIAGNOSIS — M7989 Other specified soft tissue disorders: Secondary | ICD-10-CM | POA: Insufficient documentation

## 2011-07-24 DIAGNOSIS — E119 Type 2 diabetes mellitus without complications: Secondary | ICD-10-CM | POA: Insufficient documentation

## 2011-07-24 MED ORDER — OXYCODONE-ACETAMINOPHEN 5-325 MG PO TABS: 2.0000 | ORAL_TABLET | ORAL | Status: AC | PRN

## 2011-07-24 NOTE — ED Notes (Signed)
Pt reports left great toe and left knee pain with weather changes; pt hx of gout; took gout meds yest; bp elevated; pt reports ran out of htn meds;

## 2011-07-24 NOTE — ED Provider Notes (Signed)
Medical screening examination/treatment/procedure(s) were performed by non-physician practitioner and as supervising physician I was immediately available for consultation/collaboration.  Nyja Westbrook, MD 07/24/11 2142 
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
## 2012-05-31 ENCOUNTER — Emergency Department (HOSPITAL_COMMUNITY)
Admission: EM | Admit: 2012-05-31 | Discharge: 2012-05-31 | Disposition: A | Discharge status: Home / Self Care | Payer: Self-pay | Source: Home / Self Care

## 2012-05-31 ENCOUNTER — Encounter (HOSPITAL_COMMUNITY): Payer: Self-pay

## 2012-05-31 DIAGNOSIS — E785 Hyperlipidemia, unspecified: Secondary | ICD-10-CM

## 2012-05-31 DIAGNOSIS — M545 Low back pain: Secondary | ICD-10-CM

## 2012-05-31 DIAGNOSIS — I10 Essential (primary) hypertension: Secondary | ICD-10-CM

## 2012-05-31 DIAGNOSIS — K439 Ventral hernia without obstruction or gangrene: Secondary | ICD-10-CM

## 2012-05-31 DIAGNOSIS — Z23 Encounter for immunization: Secondary | ICD-10-CM

## 2012-05-31 DIAGNOSIS — E119 Type 2 diabetes mellitus without complications: Secondary | ICD-10-CM

## 2012-05-31 HISTORY — DX: Unspecified osteoarthritis, unspecified site: M19.90

## 2012-05-31 LAB — COMPREHENSIVE METABOLIC PANEL
ALT: 19 U/L (ref 0–53)
AST: 32 U/L (ref 0–37)
Albumin: 3.6 g/dL (ref 3.5–5.2)
Alkaline Phosphatase: 86 U/L (ref 39–117)
BUN: 12 mg/dL (ref 6–23)
CO2: 24 mEq/L (ref 19–32)
Calcium: 9.5 mg/dL (ref 8.4–10.5)
Chloride: 102 mEq/L (ref 96–112)
Creatinine, Ser: 0.97 mg/dL (ref 0.50–1.35)
GFR calc Af Amer: >90 mL/min (ref >90)
GFR calc non Af Amer: 90 mL/min — ABNORMAL LOW (ref >90)
Glucose, Bld: 133 mg/dL — ABNORMAL HIGH (ref 70–99)
Potassium: 3.7 mEq/L (ref 3.5–5.1)
Sodium: 137 mEq/L (ref 135–145)
Total Bilirubin: 0.5 mg/dL (ref 0.3–1.2)
Total Protein: 8.3 g/dL (ref 6.0–8.3)

## 2012-05-31 LAB — LIPID PANEL
Cholesterol: 174 mg/dL (ref 0–200)
HDL: 34 mg/dL — ABNORMAL LOW (ref >39)
LDL Cholesterol: UNDETERMINED mg/dL (ref 0–99)
Total CHOL/HDL Ratio: 5.1 RATIO
Triglycerides: 596 mg/dL — ABNORMAL HIGH (ref <150)
VLDL: UNDETERMINED mg/dL (ref 0–40)

## 2012-05-31 MED ORDER — TRAMADOL HCL 50 MG PO TABS: 50.0000 mg | ORAL_TABLET | Freq: Four times a day (QID) | ORAL | Status: DC | PRN

## 2012-05-31 MED ORDER — INFLUENZA VIRUS VACC SPLIT PF IM SUSP
0.5000 mL | Freq: Once | INTRAMUSCULAR | Status: AC
  Administered 2012-05-31: 0.5 mL via INTRAMUSCULAR

## 2012-05-31 MED ORDER — GEMFIBROZIL 600 MG PO TABS: 600.0000 mg | ORAL_TABLET | Freq: Two times a day (BID) | ORAL | Status: DC

## 2012-05-31 MED ORDER — ALBUTEROL SULFATE HFA 108 (90 BASE) MCG/ACT IN AERS: 2.0000 | INHALATION_SPRAY | Freq: Four times a day (QID) | RESPIRATORY_TRACT | Status: DC | PRN

## 2012-05-31 MED ORDER — INDOMETHACIN 50 MG PO CAPS: 50.0000 mg | ORAL_CAPSULE | Freq: Three times a day (TID) | ORAL | Status: DC | PRN

## 2012-05-31 MED ORDER — CYCLOBENZAPRINE HCL 5 MG PO TABS: 5.0000 mg | ORAL_TABLET | Freq: Three times a day (TID) | ORAL | Status: DC | PRN

## 2012-05-31 MED ORDER — METFORMIN HCL 500 MG PO TABS: 500.0000 mg | ORAL_TABLET | Freq: Two times a day (BID) | ORAL | Status: DC

## 2012-05-31 MED ORDER — ALLOPURINOL 300 MG PO TABS: 300.0000 mg | ORAL_TABLET | Freq: Every day | ORAL | Status: DC

## 2012-05-31 MED ORDER — AMLODIPINE BESYLATE 10 MG PO TABS: 10.0000 mg | ORAL_TABLET | Freq: Every day | ORAL | Status: DC
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
## 2012-05-31 NOTE — ED Notes (Signed)
Patient c/o  abd hurts when coughing due to hernia. C/o lower pack pain x 3 weeks. Medication refills as well

## 2012-06-02 ENCOUNTER — Telehealth (HOSPITAL_COMMUNITY): Payer: Self-pay

## 2012-06-02 NOTE — Telephone Encounter (Signed)
Message copied by Lestine Mount on Fri Jun 02, 2012  5:57 PM ------      Message from: Tonia Brooms      Created: Fri Jun 02, 2012 11:52 AM      Regarding: labs                   ----- Message -----         From: Lab In [place] Interface         Sent: 05/31/2012   2:43 PM           To: [facility] Follow Up

## 2012-07-05 DIAGNOSIS — K5790 Diverticulosis of intestine, part unspecified, without perforation or abscess without bleeding: Secondary | ICD-10-CM

## 2012-07-05 HISTORY — DX: Diverticulosis of intestine, part unspecified, without perforation or abscess without bleeding: K57.90

## 2012-09-13 ENCOUNTER — Emergency Department (HOSPITAL_COMMUNITY)
Admission: EM | Admit: 2012-09-13 | Discharge: 2012-09-13 | Disposition: A | Discharge status: Home / Self Care | Payer: Self-pay | Attending: Emergency Medicine | Admitting: Emergency Medicine

## 2012-09-13 ENCOUNTER — Encounter (HOSPITAL_COMMUNITY): Payer: Self-pay

## 2012-09-13 DIAGNOSIS — R351 Nocturia: Secondary | ICD-10-CM | POA: Insufficient documentation

## 2012-09-13 DIAGNOSIS — Z79899 Other long term (current) drug therapy: Secondary | ICD-10-CM | POA: Insufficient documentation

## 2012-09-13 DIAGNOSIS — R040 Epistaxis: Secondary | ICD-10-CM | POA: Insufficient documentation

## 2012-09-13 DIAGNOSIS — E1169 Type 2 diabetes mellitus with other specified complication: Secondary | ICD-10-CM | POA: Insufficient documentation

## 2012-09-13 DIAGNOSIS — E785 Hyperlipidemia, unspecified: Secondary | ICD-10-CM | POA: Insufficient documentation

## 2012-09-13 DIAGNOSIS — M109 Gout, unspecified: Secondary | ICD-10-CM | POA: Insufficient documentation

## 2012-09-13 DIAGNOSIS — M129 Arthropathy, unspecified: Secondary | ICD-10-CM | POA: Insufficient documentation

## 2012-09-13 DIAGNOSIS — R739 Hyperglycemia, unspecified: Secondary | ICD-10-CM

## 2012-09-13 DIAGNOSIS — Z8719 Personal history of other diseases of the digestive system: Secondary | ICD-10-CM | POA: Insufficient documentation

## 2012-09-13 DIAGNOSIS — R350 Frequency of micturition: Secondary | ICD-10-CM | POA: Insufficient documentation

## 2012-09-13 DIAGNOSIS — I10 Essential (primary) hypertension: Secondary | ICD-10-CM | POA: Insufficient documentation

## 2012-09-13 DIAGNOSIS — K219 Gastro-esophageal reflux disease without esophagitis: Secondary | ICD-10-CM | POA: Insufficient documentation

## 2012-09-13 DIAGNOSIS — Z7982 Long term (current) use of aspirin: Secondary | ICD-10-CM | POA: Insufficient documentation

## 2012-09-13 LAB — COMPREHENSIVE METABOLIC PANEL
ALT: 18 U/L (ref 0–53)
AST: 28 U/L (ref 0–37)
Albumin: 3.9 g/dL (ref 3.5–5.2)
Alkaline Phosphatase: 94 U/L (ref 39–117)
BUN: 25 mg/dL — ABNORMAL HIGH (ref 6–23)
CO2: 21 mEq/L (ref 19–32)
Calcium: 9.5 mg/dL (ref 8.4–10.5)
Chloride: 101 mEq/L (ref 96–112)
Creatinine, Ser: 1.08 mg/dL (ref 0.50–1.35)
GFR calc Af Amer: 86 mL/min — ABNORMAL LOW (ref >90)
GFR calc non Af Amer: 74 mL/min — ABNORMAL LOW (ref >90)
Glucose, Bld: 136 mg/dL — ABNORMAL HIGH (ref 70–99)
Potassium: 3.8 mEq/L (ref 3.5–5.1)
Sodium: 135 mEq/L (ref 135–145)
Total Bilirubin: 0.5 mg/dL (ref 0.3–1.2)
Total Protein: 8.4 g/dL — ABNORMAL HIGH (ref 6.0–8.3)

## 2012-09-13 LAB — URINALYSIS, ROUTINE W REFLEX MICROSCOPIC
Bilirubin Urine: NEGATIVE
Glucose, UA: NEGATIVE mg/dL
Hgb urine dipstick: NEGATIVE
Ketones, ur: NEGATIVE mg/dL
Leukocytes, UA: NEGATIVE
Nitrite: NEGATIVE
Protein, ur: 30 mg/dL — AB
Specific Gravity, Urine: 1.017 (ref 1.005–1.030)
Urobilinogen, UA: 0.2 mg/dL (ref 0.0–1.0)
pH: 5.5 (ref 5.0–8.0)

## 2012-09-13 LAB — CBC WITH DIFFERENTIAL/PLATELET
Basophils Absolute: 0 10*3/uL (ref 0.0–0.1)
Basophils Relative: 1 % (ref 0–1)
Eosinophils Absolute: 0.1 10*3/uL (ref 0.0–0.7)
Eosinophils Relative: 2 % (ref 0–5)
HCT: 40.2 % (ref 39.0–52.0)
Hemoglobin: 14 g/dL (ref 13.0–17.0)
Lymphocytes Relative: 31 % (ref 12–46)
Lymphs Abs: 2.2 10*3/uL (ref 0.7–4.0)
MCH: 32.6 pg (ref 26.0–34.0)
MCHC: 34.8 g/dL (ref 30.0–36.0)
MCV: 93.7 fL (ref 78.0–100.0)
Monocytes Absolute: 0.4 10*3/uL (ref 0.1–1.0)
Monocytes Relative: 5 % (ref 3–12)
Neutro Abs: 4.2 10*3/uL (ref 1.7–7.7)
Neutrophils Relative %: 61 % (ref 43–77)
Platelets: 208 10*3/uL (ref 150–400)
RBC: 4.29 MIL/uL (ref 4.22–5.81)
RDW: 12.9 % (ref 11.5–15.5)
WBC: 6.9 10*3/uL (ref 4.0–10.5)

## 2012-09-13 LAB — URINE MICROSCOPIC-ADD ON

## 2012-09-13 LAB — GLUCOSE, CAPILLARY: Glucose-Capillary: 144 mg/dL — ABNORMAL HIGH (ref 70–99)

## 2012-09-13 NOTE — ED Notes (Signed)
Patient c/o having nosebleeds intermittently x 1 month, the last being this AM. Patient states some days it bleeds 2-3 times a time lasting about 3-20 minutes and heavy at times. Patient states he has been having frequent urination x 5 months and occurs mainly at night. Patient has a history of diabetes.
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
## 2012-09-14 NOTE — ED Provider Notes (Signed)
Medical screening examination/treatment/procedure(s) were performed by non-physician practitioner and as supervising physician I was immediately available for consultation/collaboration.  Jahlil Ziller T Sher Shampine, MD 09/14/12 1009 

## 2012-12-22 ENCOUNTER — Ambulatory Visit: Payer: Self-pay

## 2012-12-29 ENCOUNTER — Ambulatory Visit: Payer: Self-pay | Attending: Family Medicine | Admitting: Family Medicine

## 2012-12-29 ENCOUNTER — Encounter: Payer: Self-pay | Admitting: Family Medicine

## 2012-12-29 VITALS — BP 176/106 | HR 71 | Temp 98.5°F | Resp 18 | Ht 74.0 in | Wt 252.2 lb

## 2012-12-29 DIAGNOSIS — N529 Male erectile dysfunction, unspecified: Secondary | ICD-10-CM

## 2012-12-29 DIAGNOSIS — E785 Hyperlipidemia, unspecified: Secondary | ICD-10-CM

## 2012-12-29 DIAGNOSIS — K625 Hemorrhage of anus and rectum: Secondary | ICD-10-CM

## 2012-12-29 DIAGNOSIS — E1065 Type 1 diabetes mellitus with hyperglycemia: Secondary | ICD-10-CM | POA: Insufficient documentation

## 2012-12-29 DIAGNOSIS — IMO0001 Reserved for inherently not codable concepts without codable children: Secondary | ICD-10-CM

## 2012-12-29 DIAGNOSIS — E119 Type 2 diabetes mellitus without complications: Secondary | ICD-10-CM | POA: Insufficient documentation

## 2012-12-29 DIAGNOSIS — I10 Essential (primary) hypertension: Secondary | ICD-10-CM

## 2012-12-29 LAB — COMPREHENSIVE METABOLIC PANEL
ALT: 22 U/L (ref 0–53)
AST: 18 U/L (ref 0–37)
Albumin: 4.5 g/dL (ref 3.5–5.2)
Alkaline Phosphatase: 90 U/L (ref 39–117)
BUN: 16 mg/dL (ref 6–23)
CO2: 27 mEq/L (ref 19–32)
Calcium: 9.2 mg/dL (ref 8.4–10.5)
Chloride: 102 mEq/L (ref 96–112)
Creat: 1.02 mg/dL (ref 0.50–1.35)
Glucose, Bld: 111 mg/dL — ABNORMAL HIGH (ref 70–99)
Potassium: 4.2 mEq/L (ref 3.5–5.3)
Sodium: 137 mEq/L (ref 135–145)
Total Bilirubin: 0.3 mg/dL (ref 0.3–1.2)
Total Protein: 7.9 g/dL (ref 6.0–8.3)

## 2012-12-29 LAB — CBC
HCT: 40.7 % (ref 39.0–52.0)
Hemoglobin: 14.5 g/dL (ref 13.0–17.0)
MCH: 31.2 pg (ref 26.0–34.0)
MCHC: 35.6 g/dL (ref 30.0–36.0)
MCV: 87.5 fL (ref 78.0–100.0)
Platelets: 226 10*3/uL (ref 150–400)
RBC: 4.65 MIL/uL (ref 4.22–5.81)
RDW: 13.7 % (ref 11.5–15.5)
WBC: 6.9 10*3/uL (ref 4.0–10.5)

## 2012-12-29 LAB — LIPID PANEL
Cholesterol: 167 mg/dL (ref 0–200)
HDL: 36 mg/dL — ABNORMAL LOW (ref >39)
LDL Cholesterol: 91 mg/dL (ref 0–99)
Total CHOL/HDL Ratio: 4.6 Ratio
Triglycerides: 201 mg/dL — ABNORMAL HIGH (ref <150)
VLDL: 40 mg/dL (ref 0–40)

## 2012-12-29 LAB — TSH: TSH: 3.076 u[IU]/mL (ref 0.350–4.500)

## 2012-12-29 MED ORDER — AMLODIPINE BESYLATE 10 MG PO TABS: 10.0000 mg | ORAL_TABLET | Freq: Every day | ORAL | Status: DC

## 2012-12-29 MED ORDER — GEMFIBROZIL 600 MG PO TABS: 600.0000 mg | ORAL_TABLET | Freq: Two times a day (BID) | ORAL | Status: DC

## 2012-12-29 MED ORDER — ALLOPURINOL 300 MG PO TABS: 300.0000 mg | ORAL_TABLET | Freq: Every day | ORAL | Status: DC

## 2012-12-29 MED ORDER — TADALAFIL 10 MG PO TABS: 10.0000 mg | ORAL_TABLET | ORAL | Status: DC | PRN

## 2012-12-29 MED ORDER — METFORMIN HCL 500 MG PO TABS: 500.0000 mg | ORAL_TABLET | Freq: Two times a day (BID) | ORAL | Status: DC

## 2012-12-29 MED ORDER — INDOMETHACIN 50 MG PO CAPS: 50.0000 mg | ORAL_CAPSULE | Freq: Three times a day (TID) | ORAL | Status: DC | PRN

## 2012-12-29 MED ORDER — DOCUSATE SODIUM 100 MG PO CAPS: 100.0000 mg | ORAL_CAPSULE | Freq: Two times a day (BID) | ORAL | Status: DC

## 2012-12-29 MED ORDER — ALBUTEROL SULFATE HFA 108 (90 BASE) MCG/ACT IN AERS: 2.0000 | INHALATION_SPRAY | Freq: Four times a day (QID) | RESPIRATORY_TRACT | Status: DC | PRN

## 2012-12-29 NOTE — Progress Notes (Signed)
Patient ID: [name], male   DOB: 12/21/1955, 57 y.o.   MRN: [id number]  CC: refill meds   HPI: Patient says that he has been out of his blood pressure medications for the past 2 weeks.  He's presenting today for a followup and refills.  He also would like to have his labs done today.  He also reports that he would like a refill of Cialis for his erectile dysfunction.  The patient reports that he has no chest pain or shortness of breath.  He reports that he has occasionally seen blood in his stool.  He had a colonoscopy done approximately 15 years ago.  He had some polyps removed.  He was scheduled to have a repeat colonoscopy but missed the appointment because of traveling for his job.  The patient reports that he is trying to get medical insurance this week and would like to go ahead and proceed with a colonoscopy when he gets his medical insurance.  No Known Allergies Past Medical History  Diagnosis Date  . Acid reflux   . Gout   . Hypertension   . Diabetes mellitus   . Hyperlipidemia   . Incarcerated ventral hernia   . Mouth bleeding     upper teeth right back side loose and bleed at night  . Arthritis    Current Outpatient Prescriptions on File Prior to Visit  Medication Sig Dispense Refill  . aspirin 81 MG tablet Take 81 mg by mouth daily.        . lansoprazole (PREVACID) 15 MG capsule Take 15 mg by mouth daily.       No current facility-administered medications on file prior to visit.   Family History  Problem Relation Age of Onset  . Diabetes Mother   . Cancer Father   . Multiple sclerosis Sister   . Heart failure Brother    History   Social History  . Marital Status: Single    Spouse Name: N/A    Number of Children: N/A  . Years of Education: N/A   Occupational History  . Not on file.   Social History Main Topics  . Smoking status: Current Every Day Smoker -- 0.50 packs/day    Types: Cigarettes  . Smokeless tobacco: Never Used  . Alcohol Use: 0.0 oz/week      Comment: weekends only  . Drug Use: No  . Sexually Active: No   Other Topics Concern  . Not on file   Social History Narrative  . No narrative on file    Review of Systems  Constitutional: Negative for fever, chills, diaphoresis, activity change, appetite change and fatigue.  HENT: Negative for ear pain, nosebleeds, congestion, facial swelling, rhinorrhea, neck pain, neck stiffness and ear discharge.   Eyes: Negative for pain, discharge, redness, itching and visual disturbance.  Respiratory: Negative for cough, choking, chest tightness, shortness of breath, wheezing and stridor.   Cardiovascular: Negative for chest pain, palpitations and leg swelling.  Gastrointestinal: Negative for abdominal distention.  Genitourinary: Negative for dysuria, urgency, frequency, hematuria, flank pain, decreased urine volume, difficulty urinating and dyspareunia.  Musculoskeletal: Negative for back pain, joint swelling, arthralgias and gait problem.  Neurological: Negative for dizziness, tremors, seizures, syncope, facial asymmetry, speech difficulty, weakness, light-headedness, numbness and headaches.  Hematological: Negative for adenopathy. Does not bruise/bleed easily.  Psychiatric/Behavioral: Negative for hallucinations, behavioral problems, confusion, dysphoric mood, decreased concentration and agitation.    Objective:   Filed Vitals:   12/29/12 1810  BP: 176/106  Pulse: 71  Temp: 98.5 F (36.9 C)  Resp: 18    Physical Exam  Constitutional: Appears well-developed and well-nourished. No distress.  HENT: Normocephalic. External right and left ear normal. Oropharynx is clear and moist.  Eyes: Conjunctivae and EOM are normal. PERRLA, no scleral icterus.  Neck: Normal ROM. Neck supple. No JVD. No tracheal deviation. No thyromegaly.  CVS: RRR, S1/S2 +, no murmurs, no gallops, no carotid bruit.  Pulmonary: Effort and breath sounds normal, no stridor, rhonchi, wheezes, rales.  Abdominal:  Soft. BS +,  no distension, tenderness, rebound or guarding.  Musculoskeletal: Normal range of motion. No edema and no tenderness.  Lymphadenopathy: No lymphadenopathy noted, cervical, inguinal. Neuro: Alert. Normal reflexes, muscle tone coordination. No cranial nerve deficit. Skin: Skin is warm and dry. No rash noted. Not diaphoretic. No erythema. No pallor.  Psychiatric: Normal mood and affect. Behavior, judgment, thought content normal.   Lab Results  Component Value Date   WBC 6.9 09/13/2012   HGB 14.0 09/13/2012   HCT 40.2 09/13/2012   MCV 93.7 09/13/2012   PLT 208 09/13/2012   Lab Results  Component Value Date   CREATININE 1.08 09/13/2012   BUN 25* 09/13/2012   NA 135 09/13/2012   K 3.8 09/13/2012   CL 101 09/13/2012   CO2 21 09/13/2012    Lab Results  Component Value Date   HGBA1C 6.1* 02/20/2011   Lipid Panel     Component Value Date/Time   CHOL 174 05/31/2012 1342   TRIG 596* 05/31/2012 1342   HDL 34* 05/31/2012 1342   CHOLHDL 5.1 05/31/2012 1342   VLDL UNABLE TO CALCULATE IF TRIGLYCERIDE OVER 400 mg/dL 40/98/1191 4782   LDLCALC UNABLE TO CALCULATE IF TRIGLYCERIDE OVER 400 mg/dL 95/62/1308 6578     Assessment and plan:   Patient Active Problem List   Diagnosis Date Noted  . Type II or unspecified type diabetes mellitus without mention of complication, uncontrolled 12/29/2012  . Unspecified essential hypertension 12/29/2012  . Dyslipidemia 12/29/2012  . Rectal bleeding 12/29/2012  . Gout 05/17/2011  . Incarcerated ventral hernia 05/11/2011  . Diabetes mellitus 05/11/2011  . Hypertension 05/11/2011        Erectile dysfunction  The patient was sent to the lab to have labs done including metabolic panel CMP lipid panel, TSH and will followup on the results.  I did refill all of his medications today and also for the Cialis 10 mg as needed.  Because he has had rectal bleeding I have advised him to see a GI gastroenterologist as soon as possible to have a colonoscopy  study done.  The patient is willing to do that at this time.  I placed a referral for GI today.  In addition, I advised the patient to avoid NSAIDs as much as possible.  He does occasionally take the Indocin for gout attack treatment.  He reports that since he has been taking the allopurinol regularly he has not had many attacks of gout.  BP recheck in 2 weeks recommended Follow up in 4 months  The patient was given clear instructions to go to ER or return to medical center if symptoms don't improve, worsen or new problems develop.  The patient verbalized understanding.  The patient was told to call to get any lab results if not heard anything in the next week.    Rodney Langton, MD, CDE, FAAFP [facility] [place], [place]

## 2012-12-29 NOTE — Patient Instructions (Addendum)
Rectal Bleeding Rectal bleeding is when blood passes out of the anus. It is usually a sign that something is wrong. It may not be serious, but it should always be evaluated. Rectal bleeding may present as bright red blood or extremely dark stools. The color may range from dark red or maroon to black (like tar). It is important that the cause of rectal bleeding be identified so treatment can be started and the problem corrected. CAUSES   Hemorrhoids. These are enlarged (dilated) blood vessels or veins in the anal or rectal area.  Fistulas. Theseare abnormal, burrowing channels that usually run from inside the rectum to the skin around the anus. They can bleed.  Anal fissures. This is a tear in the tissue of the anus. Bleeding occurs with bowel movements.  Diverticulosis. This is a condition in which pockets or sacs project from the bowel wall. Occasionally, the sacs can bleed.  Diverticulitis. Thisis an infection involving diverticulosis of the colon.  Proctitis and colitis. These are conditions in which the rectum, colon, or both, can become inflamed and pitted (ulcerated).  Polyps and cancer. Polyps are non-cancerous (benign) growths in the colon that may bleed. Certain types of polyps turn into cancer.  Protrusion of the rectum. Part of the rectum can project from the anus and bleed.  Certain medicines.  Intestinal infections.  Blood vessel abnormalities. HOME CARE INSTRUCTIONS  Eat a high-fiber diet to keep your stool soft.  Limit activity.  Drink enough fluids to keep your urine clear or pale yellow.  Warm baths may be useful to soothe rectal pain.  Follow up with your caregiver as directed. SEEK IMMEDIATE MEDICAL CARE IF:  You develop increased bleeding.  You have black or dark red stools.  You vomit blood or material that looks like coffee grounds.  You have abdominal pain or tenderness.  You have a fever.  You feel weak, nauseous, or you faint.  You have  severe rectal pain or you are unable to have a bowel movement. MAKE SURE YOU:  Understand these instructions.  Will watch your condition.  Will get help right away if you are not doing well or get worse. Document Released: 12/11/2001 Document Revised: 09/13/2011 Document Reviewed: 12/06/2010 [facility] Patient Information 2014 [place], [place]. Blood Sugar Monitoring, Adult GLUCOSE METERS FOR SELF-MONITORING OF BLOOD GLUCOSE  It is important to be able to correctly measure your blood sugar (glucose). You can use a blood glucose monitor (a small battery-operated device) to check your glucose level at any time. This allows you and your caregiver to monitor your diabetes and to determine how well your treatment plan is working. The process of monitoring your blood glucose with a glucose meter is called self-monitoring of blood glucose (SMBG). When people with diabetes control their blood sugar, they have better health. To test for glucose with a typical glucose meter, place the disposable strip in the meter. Then place a small sample of blood on the "test strip." The test strip is coated with chemicals that combine with glucose in blood. The meter measures how much glucose is present. The meter displays the glucose level as a number. Several new models can record and store a number of test results. Some models can connect to personal computers to store test results or print them out.  Newer meters are often easier to use than older models. Some meters allow you to get blood from places other than your fingertip. Some new models have automatic timing, error codes, signals, or barcode  readers to help with proper adjustment (calibration). Some meters have a large display screen or spoken instructions for people with visual impairments.  INSTRUCTIONS FOR USING GLUCOSE METERS  Wash your hands with soap and warm water, or clean the area with alcohol. Dry your hands completely.  Prick the side of your  fingertip with a lancet (a sharp-pointed tool used by hand).  Hold the hand down and gently milk the finger until a small drop of blood appears. Catch the blood with the test strip.  Follow the instructions for inserting the test strip and using the SMBG meter. Most meters require the meter to be turned on and the test strip to be inserted before applying the blood sample.  Record the test result.  Read the instructions carefully for both the meter and the test strips that go with it. Meter instructions are found in the user manual. Keep this manual to help you solve any problems that may arise. Many meters use "error codes" when there is a problem with the meter, the test strip, or the blood sample on the strip. You will need the manual to understand these error codes and fix the problem.  New devices are available such as laser lancets and meters that can test blood taken from "alternative sites" of the body, other than fingertips. However, you should use standard fingertip testing if your glucose changes rapidly. Also, use standard testing if:  You have eaten, exercised, or taken insulin in the past 2 hours.  You think your glucose is low.  You tend to not feel symptoms of low blood glucose (hypoglycemia).  You are ill or under stress.  Clean the meter as directed by the manufacturer.  Test the meter for accuracy as directed by the manufacturer.  Take your meter with you to your caregiver's office. This way, you can test your glucose in front of your caregiver to make sure you are using the meter correctly. Your caregiver can also take a sample of blood to test using a routine lab method. If values on the glucose meter are close to the lab results, you and your caregiver will see that your meter is working well and you are using good technique. Your caregiver will advise you about what to do if the results do not match. FREQUENCY OF TESTING  Your caregiver will tell you how often you  should check your blood glucose. This will depend on your type of diabetes, your current level of diabetes control, and your types of medicines. The following are general guidelines, but your care plan may be different. Record all your readings and the time of day you took them for review with your caregiver.   Diabetes type 1.  When you are using insulin with good diabetic control (either multiple daily injections or via a pump), you should check your glucose 4 times a day.  If your diabetes is not well controlled, you may need to monitor more frequently, including before meals and 2 hours after meals, at bedtime, and occasionally between 2 a.m. and 3 a.m.  You should always check your glucose before a dose of insulin or before changing the rate on your insulin pump.  Diabetes type 2.  Guidelines for SMBG in diabetes type 2 are not as well defined.  If you are on insulin, follow the guidelines above.  If you are on medicines, but not insulin, and your glucose is not well controlled, you should test at least twice daily.  If you  are not on insulin, and your diabetes is controlled with medicines or diet alone, you should test at least once daily, usually before breakfast.  A weekly profile will help your caregiver advise you on your care plan. The week before your visit, check your glucose before a meal and 2 hours after a meal at least daily. You may want to test before and after a different meal each day so you and your caregiver can tell how well controlled your blood sugars are throughout the course of a 24 hour period.  Gestational diabetes (diabetes during pregnancy).  Frequent testing is often necessary. Accurate timing is important.  If you are not on insulin, check your glucose 4 times a day. Check it before breakfast and 1 hour after the start of each meal.  If you are on insulin, check your glucose 6 times a day. Check it before each meal and 1 hour after the first bite of each  meal.  General guidelines.  More frequent testing is required at the start of insulin treatment. Your caregiver will instruct you.  Test your glucose any time you suspect you have low blood sugar (hypoglycemia).  You should test more often when you change medicines, when you have unusual stress or illness, or in other unusual circumstances. OTHER THINGS TO KNOW ABOUT GLUCOSE METERS  Measurement Range. Most glucose meters are able to read glucose levels over a broad range of values from as low as 0 to as high as 600 mg/dL. If you get an extremely high or low reading from your meter, you should first confirm it with another reading. Report very high or very low readings to your caregiver.  Whole Blood Glucose versus Plasma Glucose. Some older home glucose meters measure glucose in your whole blood. In a lab or when using some newer home glucose meters, the glucose is measured in your plasma (one component of blood). The difference can be important. It is important for you and your caregiver to know whether your meter gives its results as "whole blood equivalent" or "plasma equivalent."  Display of High and Low Glucose Values. Part of learning how to operate a meter is understanding what the meter results mean. Know how high and low glucose concentrations are displayed on your meter.  Factors that Affect Glucose Meter Performance. The accuracy of your test results depends on many factors and varies depending on the brand and type of meter. These factors include:  Low red blood cell count (anemia).  Substances in your blood (such as uric acid, vitamin C, and others).  Environmental factors (temperature, humidity, altitude).  Name-brand versus generic test strips.  Calibration. Make sure your meter is set up properly. It is a good idea to do a calibration test with a control solution recommended by the manufacturer of your meter whenever you begin using a fresh bottle of test strips. This will  help verify the accuracy of your meter.  Improperly stored, expired, or defective test strips. Keep your strips in a dry place with the lid on.  Soiled meter.  Inadequate blood sample. NEW TECHNOLOGIES FOR GLUCOSE TESTING Alternative site testing Some glucose meters allow testing blood from alternative sites. These include the:  Upper arm.  Forearm.  Base of the thumb.  Thigh. Sampling blood from alternative sites may be desirable. However, it may have some limitations. Blood in the fingertips show changes in glucose levels more quickly than blood in other parts of the body. This means that alternative site test  results may be different from fingertip test results, not because of the meter's ability to test accurately, but because the actual glucose concentration can be different.  Continuous Glucose Monitoring Devices to measure your blood glucose continuously are available, and others are in development. These methods can be more expensive than self-monitoring with a glucose meter. However, it is uncertain how effective and reliable these devices are. Your caregiver will advise you if this approach makes sense for you. IF BLOOD SUGARS ARE CONTROLLED, PEOPLE WITH DIABETES REMAIN HEALTHIER.  SMBG is an important part of the treatment plan of patients with diabetes mellitus. Below are reasons for using SMBG:   It confirms that your glucose is at a specific, healthy level.  It detects hypoglycemia and severe hyperglycemia.  It allows you and your caregiver to make adjustments in response to changes in lifestyle for individuals requiring medicine.  It determines the need for starting insulin therapy in temporary diabetes that happens during pregnancy (gestational diabetes). Document Released: 06/24/2003 Document Revised: 09/13/2011 Document Reviewed: 10/15/2010 [facility] Patient Information 2014 [place], [place]. Ventral Hernia A ventral hernia (also called an incisional hernia) is a  hernia that occurs at the site of a previous surgical cut (incision) in the abdomen. The abdominal wall spans from your lower chest down to your pelvis. If the abdominal wall is weakened from a surgical incision, a hernia can occur. A hernia is a bulge of bowel or muscle tissue pushing out on the weakened part of the abdominal wall. Ventral hernias can get bigger from straining or lifting. Obese and older people are at higher risk for a ventral hernia. People who develop infections after surgery or require repeat incisions at the same site on the abdomen are also at increased risk. CAUSES  A ventral hernia occurs because of weakness in the abdominal wall at an incision site.  SYMPTOMS  Common symptoms include:  A visible bulge or lump on the abdominal wall.  Pain or tenderness around the lump.  Increased discomfort if you cough or make a sudden movement. If the hernia has blocked part of the intestine, a serious complication can occur (incarcerated or strangulated hernia). This can become a problem that requires emergency surgery because the blood flow to the blocked intestine may be cut off. Symptoms may include:  Feeling sick to your stomach (nauseous).  Throwing up (vomiting).  Stomach swelling (distention) or bloating.  Fever.  Rapid heartbeat. DIAGNOSIS  Your caregiver will take a medical history and perform a physical exam. Various tests may be ordered, such as:  Blood tests.  Urine tests.  Ultrasonography.  X-rays.  Computed tomography (CT). TREATMENT  Watchful waiting may be all that is needed for a smaller hernia that does not cause symptoms. Your caregiver may recommend the use of a supportive belt (truss) that helps to keep the abdominal wall intact. For larger hernias or those that cause pain, surgery to repair the hernia is usually recommended. If a hernia becomes strangulated, emergency surgery needs to be done right away. HOME CARE INSTRUCTIONS  Avoid putting  pressure or strain on the abdominal area.  Avoid heavy lifting.  Use good body positioning for physical tasks. Ask your caregiver about proper body positioning.  Use a supportive belt as directed by your caregiver.  Maintain a healthy weight.  Eat foods that are high in fiber, such as whole grains, fruits, and vegetables. Fiber helps prevent difficult bowel movements (constipation).  Drink enough fluids to keep your urine clear or pale yellow.  Follow up with your caregiver as directed. SEEK MEDICAL CARE IF:   Your hernia seems to be getting larger or more painful. SEEK IMMEDIATE MEDICAL CARE IF:   You have abdominal pain that is sudden and sharp.  Your pain becomes severe.  You have repeated vomiting.  You are sweating a lot.  You notice a rapid heartbeat.  You develop a fever. MAKE SURE YOU:   Understand these instructions.  Will watch your condition.  Will get help right away if you are not doing well or get worse. Document Released: 06/07/2012 Document Reviewed: 05/26/2012 [facility] Patient Information 2014 [place], [place].

## 2012-12-29 NOTE — Progress Notes (Signed)
Pt here for medication refill. Ran out of bp meds x2 weeks ago. Bp 176/102. C/o h/a,ocassionally dizziness.Denies n/v

## 2012-12-30 ENCOUNTER — Other Ambulatory Visit: Payer: Self-pay | Admitting: Family Medicine

## 2012-12-30 LAB — MICROALBUMIN / CREATININE URINE RATIO
Creatinine, Urine: 109.4 mg/dL
Microalb Creat Ratio: 690 mg/g — ABNORMAL HIGH (ref 0.0–30.0)
Microalb, Ur: 75.49 mg/dL — ABNORMAL HIGH (ref 0.00–1.89)

## 2012-12-30 LAB — HEMOGLOBIN A1C
Hgb A1c MFr Bld: 6.4 % — ABNORMAL HIGH (ref <5.7)
Mean Plasma Glucose: 137 mg/dL — ABNORMAL HIGH (ref <117)

## 2012-12-30 MED ORDER — LISINOPRIL-HYDROCHLOROTHIAZIDE 10-12.5 MG PO TABS: 1.0000 | ORAL_TABLET | Freq: Every day | ORAL | Status: DC

## 2012-12-30 NOTE — Progress Notes (Signed)
Quick Note:  Please inform patient that his labs came back except that his urine test shows that he is leaking a lot of protein. This is a sign of kidney damage from diabetes and high blood pressure. I sent a new prescription in for lisinopril to [facility] [address] For him to pick up and start taking to protect his kidneys from further damage and to help his blood pressure and improve the protein leaking in the kidneys. He should have a BP check with the nurse in 2 weeks. Follow up in 3 months as scheduled.   Rodney Langton, MD, CDE, FAAFP [facility] [place], [place]   ______

## 2013-01-01 ENCOUNTER — Telehealth: Payer: Self-pay | Admitting: *Deleted

## 2013-01-01 MED ORDER — TRIAMCINOLONE ACETONIDE 0.5 % EX OINT: TOPICAL_OINTMENT | CUTANEOUS | Status: DC

## 2013-01-01 MED ORDER — PREDNISONE 10 MG PO TABS: ORAL_TABLET | ORAL | Status: DC

## 2013-01-01 NOTE — Telephone Encounter (Signed)
Pt had requested prescription for poison ivy outbreak. Rx sent to Pharmacy.   Rodney Langton, MD, CDE, FAAFP [facility] [place], [place]

## 2013-01-01 NOTE — Telephone Encounter (Signed)
01/01/13 Patient made aware of lab results urine test showed leaking a lot of protein. Patient stated  That he has already picked up l[facility]opril [facility] . Requesting that prescription for his poison Burnetta Sabin Was not there . Patient stated he spoke with Dr. Laural Benes about his poison Burnetta Sabin who stated he would call in prescription. Will make Dr. Laural Benes aware. Patient informed to come back in 2 weeks for B/P check with nurse. P.Rocklyn Mayberry,RN BSN [facility]

## 2013-01-30 ENCOUNTER — Telehealth: Payer: Self-pay | Admitting: *Deleted

## 2013-01-30 ENCOUNTER — Other Ambulatory Visit: Payer: Self-pay | Admitting: Family Medicine

## 2013-01-30 NOTE — Telephone Encounter (Signed)
Patient walked into the [facility] Center and reported that he had some mild swelling of the lips with lisinopril.  He was told to discontinue the lisinopril.  He was given instructions to go to the emergency department if he has any worsening swelling of the lips or shortness of breath or sensation of throat swelling or closing.  The patient verbalized understanding.  Rodney Langton, MD, CDE, FAAFP [facility] [place], [place]

## 2013-01-30 NOTE — Telephone Encounter (Signed)
01/30/13 Patient walked into clinic noted right side upper lip with mild swelling patient started taking Lisinopril recently Dr. Laural Benes made aware  Recommend patient  To stop taking lisinopril  And if worsening to  Go to Emergency Room. Appt schedule on Thursday for follow up. P.Camarie Mctigue,RN BSN [facility]

## 2013-01-31 ENCOUNTER — Encounter (HOSPITAL_COMMUNITY): Payer: Self-pay | Admitting: *Deleted

## 2013-01-31 ENCOUNTER — Emergency Department (HOSPITAL_COMMUNITY)
Admission: EM | Admit: 2013-01-31 | Discharge: 2013-01-31 | Disposition: A | Discharge status: Home / Self Care | Payer: Self-pay | Attending: Emergency Medicine | Admitting: Emergency Medicine

## 2013-01-31 DIAGNOSIS — Z8719 Personal history of other diseases of the digestive system: Secondary | ICD-10-CM | POA: Insufficient documentation

## 2013-01-31 DIAGNOSIS — K439 Ventral hernia without obstruction or gangrene: Secondary | ICD-10-CM

## 2013-01-31 DIAGNOSIS — F17200 Nicotine dependence, unspecified, uncomplicated: Secondary | ICD-10-CM | POA: Insufficient documentation

## 2013-01-31 DIAGNOSIS — M109 Gout, unspecified: Secondary | ICD-10-CM | POA: Insufficient documentation

## 2013-01-31 DIAGNOSIS — I10 Essential (primary) hypertension: Secondary | ICD-10-CM | POA: Insufficient documentation

## 2013-01-31 DIAGNOSIS — K469 Unspecified abdominal hernia without obstruction or gangrene: Secondary | ICD-10-CM | POA: Insufficient documentation

## 2013-01-31 DIAGNOSIS — E119 Type 2 diabetes mellitus without complications: Secondary | ICD-10-CM | POA: Insufficient documentation

## 2013-01-31 DIAGNOSIS — Z79899 Other long term (current) drug therapy: Secondary | ICD-10-CM | POA: Insufficient documentation

## 2013-01-31 DIAGNOSIS — Z8739 Personal history of other diseases of the musculoskeletal system and connective tissue: Secondary | ICD-10-CM | POA: Insufficient documentation

## 2013-01-31 DIAGNOSIS — T783XXA Angioneurotic edema, initial encounter: Secondary | ICD-10-CM | POA: Insufficient documentation

## 2013-01-31 DIAGNOSIS — E785 Hyperlipidemia, unspecified: Secondary | ICD-10-CM | POA: Insufficient documentation

## 2013-01-31 DIAGNOSIS — K219 Gastro-esophageal reflux disease without esophagitis: Secondary | ICD-10-CM | POA: Insufficient documentation

## 2013-01-31 DIAGNOSIS — T448X5A Adverse effect of centrally-acting and adrenergic-neuron-blocking agents, initial encounter: Secondary | ICD-10-CM | POA: Insufficient documentation

## 2013-01-31 NOTE — ED Notes (Addendum)
Pt reports he started taking lisinopril 1 month ago. Reports this is the 5th time facial swelling has occurred since starting the medication. Woke up this morning with facial/mouth swelling. Pt denies difficulty breathing or SOB. Reports all the other times swelling eventually goes away.  Pt tried to see his doctor yesterday, doctor did not see pt but told him to stop taking medicine. Pt vomited this morning. Pt has very noticeable abdominal hernia. Pain 6/10.
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
## 2013-02-01 ENCOUNTER — Ambulatory Visit: Payer: Self-pay | Attending: Family Medicine | Admitting: Family Medicine

## 2013-02-01 VITALS — BP 127/71 | HR 70 | Temp 98.4°F | Resp 15 | Wt 237.6 lb

## 2013-02-01 DIAGNOSIS — I10 Essential (primary) hypertension: Secondary | ICD-10-CM

## 2013-02-01 DIAGNOSIS — T783XXA Angioneurotic edema, initial encounter: Secondary | ICD-10-CM | POA: Insufficient documentation

## 2013-02-01 DIAGNOSIS — Z5189 Encounter for other specified aftercare: Secondary | ICD-10-CM

## 2013-02-01 DIAGNOSIS — T783XXD Angioneurotic edema, subsequent encounter: Secondary | ICD-10-CM

## 2013-02-01 DIAGNOSIS — E785 Hyperlipidemia, unspecified: Secondary | ICD-10-CM

## 2013-02-01 NOTE — Progress Notes (Signed)
Patient ID: [name], male   DOB: 12/09/1955, 57 y.o.   MRN: [id number]  CC: ER follow up   HPI: Pt was seen for angioedema in the ER.  Pt had been on lisinopril.  Pt had stopped it last week like we asked him to but then took another on 3 days ago and stated swelling again in the lips.  Pt was monitored in the ER and sent home.  Pt says that he will not take the lisinopril again.  He denies SOB and dysphagia.  He does report that he had some abdominal bloating but it is getting better now.    Allergies  Allergen Reactions  . Ace Inhibitors     Angioedema   . Aceite De Ajonjoli (Sesame Oil)    Past Medical History  Diagnosis Date  . Acid reflux   . Gout   . Hypertension   . Diabetes mellitus   . Hyperlipidemia   . Incarcerated ventral hernia   . Mouth bleeding     upper teeth right back side loose and bleed at night  . Arthritis    Current Outpatient Prescriptions on File Prior to Visit  Medication Sig Dispense Refill  . albuterol (PROVENTIL HFA;VENTOLIN HFA) 108 (90 BASE) MCG/ACT inhaler Inhale 2 puffs into the lungs every 6 (six) hours as needed for wheezing or shortness of breath.  1 Inhaler  4  . allopurinol (ZYLOPRIM) 300 MG tablet Take 1 tablet (300 mg total) by mouth daily.  30 tablet  4  . amLODipine (NORVASC) 10 MG tablet Take 1 tablet (10 mg total) by mouth daily.  30 tablet  4  . docusate sodium (COLACE) 100 MG capsule Take 1 capsule (100 mg total) by mouth 2 (two) times daily.  100 capsule  3  . gemfibrozil (LOPID) 600 MG tablet Take 1 tablet (600 mg total) by mouth 2 (two) times daily before a meal.  60 tablet  4  . indomethacin (INDOCIN) 50 MG capsule Take 1 capsule (50 mg total) by mouth 3 (three) times daily as needed. For gout flares.  30 capsule  2  . lansoprazole (PREVACID) 15 MG capsule Take 15 mg by mouth daily.      . metFORMIN (GLUCOPHAGE) 500 MG tablet Take 1 tablet (500 mg total) by mouth 2 (two) times daily with a meal.  60 tablet  4  . ranitidine  (ZANTAC) 150 MG tablet Take 150 mg by mouth 2 (two) times daily as needed for heartburn.      . tadalafil (CIALIS) 10 MG tablet Take 1 tablet (10 mg total) by mouth every other day as needed for erectile dysfunction.  5 tablet  3  . triamcinolone ointment (KENALOG) 0.5 % Apply 1 application topically 2 (two) times daily as needed (for poison ivy lesions).       No current facility-administered medications on file prior to visit.   Family History  Problem Relation Age of Onset  . Diabetes Mother   . Cancer Father   . Multiple sclerosis Sister   . Heart failure Brother    History   Social History  . Marital Status: Single    Spouse Name: N/A    Number of Children: N/A  . Years of Education: N/A   Occupational History  . Not on file.   Social History Main Topics  . Smoking status: Current Every Day Smoker -- 0.50 packs/day    Types: Cigarettes  . Smokeless tobacco: Never Used  . Alcohol Use: 0.0  oz/week     Comment: weekends only  . Drug Use: No  . Sexually Active: No   Other Topics Concern  . Not on file   Social History Narrative  . No narrative on file    Review of Systems  Constitutional: Negative for fever, chills, diaphoresis, activity change, appetite change and fatigue.  HENT: Negative for ear pain, nosebleeds, congestion, facial swelling, rhinorrhea, neck pain, neck stiffness and ear discharge.   Eyes: Negative for pain, discharge, redness, itching and visual disturbance.  Respiratory: Negative for cough, choking, chest tightness, shortness of breath, wheezing and stridor.   Cardiovascular: Negative for chest pain, palpitations and leg swelling.  Gastrointestinal: Negative for abdominal distention.  Genitourinary: Negative for dysuria, urgency, frequency, hematuria, flank pain, decreased urine volume, difficulty urinating and dyspareunia.  Musculoskeletal: Negative for back pain, joint swelling, arthralgias and gait problem.  Neurological: Negative for  dizziness, tremors, seizures, syncope, facial asymmetry, speech difficulty, weakness, light-headedness, numbness and headaches.  Hematological: Negative for adenopathy. Does not bruise/bleed easily.  Psychiatric/Behavioral: Negative for hallucinations, behavioral problems, confusion, dysphoric mood, decreased concentration and agitation.    Objective:   Filed Vitals:   02/01/13 0918  BP: 127/71  Pulse: 70  Temp: 98.4 F (36.9 C)  Resp: 15    Physical Exam  Constitutional: Appears well-developed and well-nourished. No distress.  HENT: Normocephalic. External right and left ear normal. Oropharynx is clear and moist.  Eyes: Conjunctivae and EOM are normal. PERRLA, no scleral icterus.  Neck: Normal ROM. Neck supple. No JVD. No tracheal deviation. No thyromegaly.  CVS: RRR, S1/S2 +, no murmurs, no gallops, no carotid bruit.  Pulmonary: Effort and breath sounds normal, no stridor, rhonchi, wheezes, rales.  Abdominal: Soft. BS +,  no distension, tenderness, rebound or guarding.  Musculoskeletal: Normal range of motion. No edema and no tenderness.  Lymphadenopathy: No lymphadenopathy noted, cervical, inguinal. Neuro: Alert. Normal reflexes, muscle tone coordination. No cranial nerve deficit. Skin: Skin is warm and dry. No rash noted. Not diaphoretic. No erythema. No pallor.  Psychiatric: Normal mood and affect. Behavior, judgment, thought content normal.   Lab Results  Component Value Date   WBC 6.9 12/29/2012   HGB 14.5 12/29/2012   HCT 40.7 12/29/2012   MCV 87.5 12/29/2012   PLT 226 12/29/2012   Lab Results  Component Value Date   CREATININE 1.02 12/29/2012   BUN 16 12/29/2012   NA 137 12/29/2012   K 4.2 12/29/2012   CL 102 12/29/2012   CO2 27 12/29/2012    Lab Results  Component Value Date   HGBA1C 6.4* 12/29/2012   Lipid Panel     Component Value Date/Time   CHOL 167 12/29/2012 1829   TRIG 201* 12/29/2012 1829   HDL 36* 12/29/2012 1829   CHOLHDL 4.6 12/29/2012 1829   VLDL 40  12/29/2012 1829   LDLCALC 91 12/29/2012 1829       Assessment and plan:   Patient Active Problem List   Diagnosis Date Noted  . Type II or unspecified type diabetes mellitus without mention of complication, uncontrolled 12/29/2012  . Unspecified essential hypertension 12/29/2012  . Dyslipidemia 12/29/2012  . Rectal bleeding 12/29/2012  . Erectile dysfunction 12/29/2012  . Gout 05/17/2011  . Incarcerated ventral hernia 05/11/2011  . Diabetes mellitus 05/11/2011  . Hypertension 05/11/2011   Angioedema - resolving now.  Pt was told to stop taking lisinopril  DM controlled on current medication regimen  HTN - actually BP is good on no meds at this time, reassess  BP on follow up   RTC in 3 months   The patient was given clear instructions to go to ER or return to medical center if symptoms don't improve, worsen or new problems develop.  The patient verbalized understanding.  The patient was told to call to get any lab results if not heard anything in the next week.    Rodney Langton, MD, CDE, FAAFP [facility] [place], [place]

## 2013-02-01 NOTE — Progress Notes (Signed)
Patient here for follow up Had some type of allergic reaction to one of his medications Stated has stopped lisinopril Lips and mouth had swelled up

## 2013-02-01 NOTE — Patient Instructions (Signed)
Angioedema Angioedema (AE) is a sudden swelling of the eyelids, lips, lobes of ears, external genitalia, skin, and other parts of the body. AE can happen by itself. It usually begins during the night and is found on awakening. It can happen with hives and other allergic reactions. Attacks can be mild and annoying, or life-threatening if the air passages swell. AE generally occurs in a short time period (over minutes to hours) and gets better in 24 to 48 hours. It usually does not cause any serious problems.  There are 2 different kinds of AE:   Allergic AE.  Nonallergic AE.  There may be an overreaction or direct stimulation of cells that are a part of the immune system (mast cells).  There may be problems with the release of chemicals made by the body that cause swelling and inflammation (kinins). AE due to kinins can be inherited from parents (hereditary), or it can develop on its own (acquired). Acquired AE either shows up before, or along with, certain diseases or is due to the body's immune system attacking parts of the body's own cells (autoimmune). CAUSES  Allergic  AE due to allergic reactions are caused by something that causes the body to react (trigger). Common triggers include:  Foods.  Medicines.  Latex.  Direct contact with certain fruits, vegetables, or animal saliva.  Insect stings. Nonallergic  Mast cell stimulation may be caused by:  Medicines.  Dyes used in X-rays.  The body's own immune system reactions to parts of the body (autoimmune disease).  Possibly, some virus infections.  AE due to problems with kinins can be hereditary or acquired. Attacks are triggered by:  Mild injury.  Dental work or any surgery.  Stress.  Sudden changes in temperature.  Exercise.  Medicines.  AE due to problems with kinins can also be due to certain medicines, especially blood pressure medicines like angiotensin-converting enzyme (ACE) inhibitors. African Americans  are at nearly 5 times greater risk of developing AE than Caucasians from ACE inhibitors. SYMPTOMS  Allergic symptoms:  Non-itchy swelling of the skin. Often the swelling is on the face and lips, but any area of the skin can swell. Sometimes, the swelling can be painful. If hives are present, there is intense itching.  Breathing problems if the air passages swell. Nonallergic symptoms:  If internal organs are involved, there may be:  Nausea.  Abdominal pain.  Vomiting.  Difficulty swallowing.  Difficulty passing urine.  Breathing problems if the air passages swell. Depending on the cause of AE, episodes may:  Only happen once (if triggers are removed or avoided).  Come back in unpredictable patterns.  Repeat for several years and then gradually fade away. DIAGNOSIS  AE is diagnosed by:   Asking questions to find out how fast the symptoms began.  Taking a family history.  Physical exam.  Diagnostic tests. Tests could include:  Allergy skin tests to see if the problem is allergic.  Blood tests to diagnose hereditary and some acquired types of AE.  Other tests to see if there is a hidden disease leading to the AE. TREATMENT  Treatment depends on the type and cause (if any) of the AE. Allergic  Allergic types of AE are treated with:  Immediate removal of the trigger or medicine (if any).  Epinephrine injection.  Steroids.  Antihistamines.  Hospitalization for severe attacks. Nonallergic  Mast cell stimulation types of AE are treated with:  Immediate removal of the trigger or medicine (if any).  Epinephrine injection.    Steroids.  Antihistamines.  Hospitalization for severe attacks.  Hereditary AE is treated with:  Medicines to prevent and treat attacks. There is little response to antihistamines, epinephrine, or steroids.  Preventive medicines before dental work or surgery.  Removing or avoiding medicines that trigger  attacks.  Hospitalization for severe attacks.  Acquired AE is treated with:  Treating underlying disease (if any).  Medicines to prevent and treat attacks. HOME CARE INSTRUCTIONS   Always carry your emergency allergy treatment medicines with you.  Wear a medical bracelet.  Avoid known triggers. SEEK MEDICAL CARE IF:   You get repeat attacks.  Your attacks are more frequent or more severe despite preventive measures.  You have hereditary AE and are considering having children. It is important to discuss the risks of passing this on to your children. SEEK IMMEDIATE MEDICAL CARE IF:   You have difficulty breathing.  You have difficulty swallowing.  You experience fainting. This condition should be treated immediately. It can be life-threatening if it involves throat swelling. Document Released: 08/30/2001 Document Revised: 09/13/2011 Document Reviewed: 06/20/2008 [facility] Patient Information 2014 [place], [place]. Hypertension As your heart beats, it forces blood through your arteries. This force is your blood pressure. If the pressure is too high, it is called hypertension (HTN) or high blood pressure. HTN is dangerous because you may have it and not know it. High blood pressure may mean that your heart has to work harder to pump blood. Your arteries may be narrow or stiff. The extra work puts you at risk for heart disease, stroke, and other problems.  Blood pressure consists of two numbers, a higher number over a lower, 110/72, for example. It is stated as "110 over 72." The ideal is below 120 for the top number (systolic) and under 80 for the bottom (diastolic). Write down your blood pressure today. You should pay close attention to your blood pressure if you have certain conditions such as:  Heart failure.  Prior heart attack.  Diabetes  Chronic kidney disease.  Prior stroke.  Multiple risk factors for heart disease. To see if you have HTN, your blood pressure should  be measured while you are seated with your arm held at the level of the heart. It should be measured at least twice. A one-time elevated blood pressure reading (especially in the Emergency Department) does not mean that you need treatment. There may be conditions in which the blood pressure is different between your right and left arms. It is important to see your caregiver soon for a recheck. Most people have essential hypertension which means that there is not a specific cause. This type of high blood pressure may be lowered by changing lifestyle factors such as:  Stress.  Smoking.  Lack of exercise.  Excessive weight.  Drug/tobacco/alcohol use.  Eating less salt. Most people do not have symptoms from high blood pressure until it has caused damage to the body. Effective treatment can often prevent, delay or reduce that damage. TREATMENT  When a cause has been identified, treatment for high blood pressure is directed at the cause. There are a large number of medications to treat HTN. These fall into several categories, and your caregiver will help you select the medicines that are best for you. Medications may have side effects. You should review side effects with your caregiver. If your blood pressure stays high after you have made lifestyle changes or started on medicines,   Your medication(s) may need to be changed.  Other problems may need  to be addressed.  Be certain you understand your prescriptions, and know how and when to take your medicine.  Be sure to follow up with your caregiver within the time frame advised (usually within two weeks) to have your blood pressure rechecked and to review your medications.  If you are taking more than one medicine to lower your blood pressure, make sure you know how and at what times they should be taken. Taking two medicines at the same time can result in blood pressure that is too low. SEEK IMMEDIATE MEDICAL CARE IF:  You develop a severe  headache, blurred or changing vision, or confusion.  You have unusual weakness or numbness, or a faint feeling.  You have severe chest or abdominal pain, vomiting, or breathing problems. MAKE SURE YOU:   Understand these instructions.  Will watch your condition.  Will get help right away if you are not doing well or get worse. Document Released: 06/21/2005 Document Revised: 09/13/2011 Document Reviewed: 02/09/2008 [facility] Patient Information 2014 [place], [place].

## 2013-02-02 ENCOUNTER — Encounter (HOSPITAL_COMMUNITY): Payer: Self-pay | Admitting: Emergency Medicine

## 2013-02-02 ENCOUNTER — Telehealth: Payer: Self-pay | Admitting: Family Medicine

## 2013-02-02 ENCOUNTER — Observation Stay (HOSPITAL_COMMUNITY)
Admission: EM | Admit: 2013-02-02 | Discharge: 2013-02-03 | Disposition: A | Discharge status: Home / Self Care | Payer: MEDICAID | Attending: Internal Medicine | Admitting: Internal Medicine

## 2013-02-02 DIAGNOSIS — IMO0001 Reserved for inherently not codable concepts without codable children: Secondary | ICD-10-CM

## 2013-02-02 DIAGNOSIS — M109 Gout, unspecified: Secondary | ICD-10-CM | POA: Insufficient documentation

## 2013-02-02 DIAGNOSIS — K436 Other and unspecified ventral hernia with obstruction, without gangrene: Secondary | ICD-10-CM

## 2013-02-02 DIAGNOSIS — T464X5A Adverse effect of angiotensin-converting-enzyme inhibitors, initial encounter: Secondary | ICD-10-CM

## 2013-02-02 DIAGNOSIS — E119 Type 2 diabetes mellitus without complications: Secondary | ICD-10-CM | POA: Insufficient documentation

## 2013-02-02 DIAGNOSIS — T783XXA Angioneurotic edema, initial encounter: Principal | ICD-10-CM | POA: Insufficient documentation

## 2013-02-02 DIAGNOSIS — R1110 Vomiting, unspecified: Secondary | ICD-10-CM | POA: Diagnosis present

## 2013-02-02 DIAGNOSIS — E785 Hyperlipidemia, unspecified: Secondary | ICD-10-CM

## 2013-02-02 DIAGNOSIS — R040 Epistaxis: Secondary | ICD-10-CM

## 2013-02-02 DIAGNOSIS — I10 Essential (primary) hypertension: Secondary | ICD-10-CM

## 2013-02-02 DIAGNOSIS — Z79899 Other long term (current) drug therapy: Secondary | ICD-10-CM | POA: Insufficient documentation

## 2013-02-02 DIAGNOSIS — T46905A Adverse effect of unspecified agents primarily affecting the cardiovascular system, initial encounter: Secondary | ICD-10-CM | POA: Insufficient documentation

## 2013-02-02 DIAGNOSIS — M1990 Unspecified osteoarthritis, unspecified site: Secondary | ICD-10-CM | POA: Insufficient documentation

## 2013-02-02 DIAGNOSIS — R197 Diarrhea, unspecified: Secondary | ICD-10-CM

## 2013-02-02 DIAGNOSIS — N529 Male erectile dysfunction, unspecified: Secondary | ICD-10-CM

## 2013-02-02 DIAGNOSIS — K219 Gastro-esophageal reflux disease without esophagitis: Secondary | ICD-10-CM | POA: Insufficient documentation

## 2013-02-02 DIAGNOSIS — K625 Hemorrhage of anus and rectum: Secondary | ICD-10-CM

## 2013-02-02 DIAGNOSIS — Z5189 Encounter for other specified aftercare: Secondary | ICD-10-CM

## 2013-02-02 DIAGNOSIS — T464X5D Adverse effect of angiotensin-converting-enzyme inhibitors, subsequent encounter: Secondary | ICD-10-CM

## 2013-02-02 LAB — COMPREHENSIVE METABOLIC PANEL
ALT: 14 U/L (ref 0–53)
AST: 19 U/L (ref 0–37)
Albumin: 3.8 g/dL (ref 3.5–5.2)
Alkaline Phosphatase: 95 U/L (ref 39–117)
BUN: 19 mg/dL (ref 6–23)
CO2: 20 mEq/L (ref 19–32)
Calcium: 9.4 mg/dL (ref 8.4–10.5)
Chloride: 101 mEq/L (ref 96–112)
Creatinine, Ser: 0.9 mg/dL (ref 0.50–1.35)
GFR calc Af Amer: >90 mL/min (ref >90)
GFR calc non Af Amer: >90 mL/min (ref >90)
Glucose, Bld: 112 mg/dL — ABNORMAL HIGH (ref 70–99)
Potassium: 4 mEq/L (ref 3.5–5.1)
Sodium: 133 mEq/L — ABNORMAL LOW (ref 135–145)
Total Bilirubin: 0.5 mg/dL (ref 0.3–1.2)
Total Protein: 8.4 g/dL — ABNORMAL HIGH (ref 6.0–8.3)

## 2013-02-02 LAB — GLUCOSE, CAPILLARY
Glucose-Capillary: 132 mg/dL — ABNORMAL HIGH (ref 70–99)
Glucose-Capillary: 135 mg/dL — ABNORMAL HIGH (ref 70–99)
Glucose-Capillary: 149 mg/dL — ABNORMAL HIGH (ref 70–99)
Glucose-Capillary: 89 mg/dL (ref 70–99)

## 2013-02-02 LAB — CBC
HCT: 39.1 % (ref 39.0–52.0)
Hemoglobin: 13.2 g/dL (ref 13.0–17.0)
MCH: 31.4 pg (ref 26.0–34.0)
MCHC: 33.8 g/dL (ref 30.0–36.0)
MCV: 93.1 fL (ref 78.0–100.0)
Platelets: 207 10*3/uL (ref 150–400)
RBC: 4.2 MIL/uL — ABNORMAL LOW (ref 4.22–5.81)
RDW: 13.2 % (ref 11.5–15.5)
WBC: 7.6 10*3/uL (ref 4.0–10.5)

## 2013-02-02 LAB — MRSA PCR SCREENING: MRSA by PCR: POSITIVE — AB

## 2013-02-02 MED ORDER — ONDANSETRON HCL 4 MG/2ML IJ SOLN: 4.0000 mg | Freq: Four times a day (QID) | INTRAMUSCULAR | Status: DC | PRN

## 2013-02-02 MED ORDER — DIPHENHYDRAMINE HCL 50 MG/ML IJ SOLN
12.5000 mg | Freq: Four times a day (QID) | INTRAMUSCULAR | Status: DC
  Administered 2013-02-02 – 2013-02-03 (×4): 12.5 mg via INTRAVENOUS
  Filled 2013-02-02 (×2): qty 0.25
  Filled 2013-02-02 (×3): qty 1
  Filled 2013-02-02 (×2): qty 0.25
  Filled 2013-02-02: qty 1

## 2013-02-02 MED ORDER — SODIUM CHLORIDE 0.9 % IJ SOLN
3.0000 mL | Freq: Two times a day (BID) | INTRAMUSCULAR | Status: DC
  Administered 2013-02-02 (×2): 3 mL via INTRAVENOUS

## 2013-02-02 MED ORDER — MUPIROCIN 2 % EX OINT
1.0000 "application " | TOPICAL_OINTMENT | Freq: Two times a day (BID) | CUTANEOUS | Status: DC
  Administered 2013-02-02 – 2013-02-03 (×3): 1 via NASAL
  Filled 2013-02-02: qty 22

## 2013-02-02 MED ORDER — FAMOTIDINE IN NACL 20-0.9 MG/50ML-% IV SOLN
20.0000 mg | Freq: Two times a day (BID) | INTRAVENOUS | Status: DC
  Administered 2013-02-02: 20 mg via INTRAVENOUS
  Filled 2013-02-02 (×2): qty 50

## 2013-02-02 MED ORDER — LOPERAMIDE HCL 2 MG PO CAPS
4.0000 mg | ORAL_CAPSULE | Freq: Once | ORAL | Status: AC
  Administered 2013-02-02: 4 mg via ORAL
  Filled 2013-02-02: qty 2

## 2013-02-02 MED ORDER — INSULIN ASPART 100 UNIT/ML ~~LOC~~ SOLN
0.0000 [IU] | SUBCUTANEOUS | Status: DC
  Administered 2013-02-02 – 2013-02-03 (×4): 1 [IU] via SUBCUTANEOUS
  Administered 2013-02-03: 2 [IU] via SUBCUTANEOUS

## 2013-02-02 MED ORDER — ALLOPURINOL 300 MG PO TABS
300.0000 mg | ORAL_TABLET | Freq: Every day | ORAL | Status: DC
  Administered 2013-02-02 – 2013-02-03 (×2): 300 mg via ORAL
  Filled 2013-02-02 (×2): qty 1

## 2013-02-02 MED ORDER — ALBUTEROL SULFATE HFA 108 (90 BASE) MCG/ACT IN AERS
2.0000 | INHALATION_SPRAY | Freq: Four times a day (QID) | RESPIRATORY_TRACT | Status: DC | PRN
  Filled 2013-02-02: qty 6.7

## 2013-02-02 MED ORDER — OXYCODONE HCL 5 MG PO TABS: 5.0000 mg | ORAL_TABLET | ORAL | Status: DC | PRN

## 2013-02-02 MED ORDER — ONDANSETRON HCL 4 MG PO TABS: 4.0000 mg | ORAL_TABLET | Freq: Four times a day (QID) | ORAL | Status: DC | PRN

## 2013-02-02 MED ORDER — CHLORHEXIDINE GLUCONATE CLOTH 2 % EX PADS: 6.0000 | MEDICATED_PAD | Freq: Every day | CUTANEOUS | Status: DC

## 2013-02-02 MED ORDER — ONDANSETRON 4 MG PO TBDP
4.0000 mg | ORAL_TABLET | Freq: Once | ORAL | Status: AC
  Administered 2013-02-02: 4 mg via ORAL
  Filled 2013-02-02: qty 1

## 2013-02-02 MED ORDER — SODIUM CHLORIDE 0.9 % IJ SOLN: 3.0000 mL | INTRAMUSCULAR | Status: DC | PRN

## 2013-02-02 MED ORDER — METHYLPREDNISOLONE SODIUM SUCC 40 MG IJ SOLR
40.0000 mg | Freq: Four times a day (QID) | INTRAMUSCULAR | Status: DC
  Administered 2013-02-02 – 2013-02-03 (×3): 40 mg via INTRAVENOUS
  Filled 2013-02-02 (×7): qty 1

## 2013-02-02 MED ORDER — MORPHINE SULFATE 2 MG/ML IJ SOLN: 2.0000 mg | INTRAMUSCULAR | Status: DC | PRN

## 2013-02-02 MED ORDER — DIPHENHYDRAMINE HCL 25 MG PO CAPS
50.0000 mg | ORAL_CAPSULE | Freq: Once | ORAL | Status: AC
  Administered 2013-02-02: 50 mg via ORAL
  Filled 2013-02-02: qty 2

## 2013-02-02 MED ORDER — METHYLPREDNISOLONE SODIUM SUCC 125 MG IJ SOLR
125.0000 mg | INTRAMUSCULAR | Status: AC
  Administered 2013-02-02: 125 mg via INTRAVENOUS
  Filled 2013-02-02: qty 2

## 2013-02-02 MED ORDER — DOCUSATE SODIUM 100 MG PO CAPS
100.0000 mg | ORAL_CAPSULE | Freq: Two times a day (BID) | ORAL | Status: DC
  Administered 2013-02-02 – 2013-02-03 (×2): 100 mg via ORAL
  Filled 2013-02-02 (×3): qty 1

## 2013-02-02 MED ORDER — SODIUM CHLORIDE 0.9 % IV SOLN
INTRAVENOUS | Status: DC
  Administered 2013-02-02: 15:00:00 via INTRAVENOUS

## 2013-02-02 MED ORDER — ACETAMINOPHEN 325 MG PO TABS: 650.0000 mg | ORAL_TABLET | Freq: Four times a day (QID) | ORAL | Status: DC | PRN

## 2013-02-02 MED ORDER — RANITIDINE HCL 150 MG/10ML PO SYRP
150.0000 mg | ORAL_SOLUTION | ORAL | Status: AC
  Administered 2013-02-02: 150 mg via ORAL
  Filled 2013-02-02 (×2): qty 10

## 2013-02-02 MED ORDER — SODIUM CHLORIDE 0.9 % IV SOLN: 250.0000 mL | INTRAVENOUS | Status: DC | PRN

## 2013-02-02 MED ORDER — GEMFIBROZIL 600 MG PO TABS
600.0000 mg | ORAL_TABLET | Freq: Two times a day (BID) | ORAL | Status: DC
  Administered 2013-02-02 – 2013-02-03 (×2): 600 mg via ORAL
  Filled 2013-02-02 (×4): qty 1

## 2013-02-02 MED ORDER — ACETAMINOPHEN 650 MG RE SUPP: 650.0000 mg | Freq: Four times a day (QID) | RECTAL | Status: DC | PRN

## 2013-02-02 MED ORDER — AMLODIPINE BESYLATE 10 MG PO TABS
10.0000 mg | ORAL_TABLET | Freq: Every day | ORAL | Status: DC
  Administered 2013-02-02 – 2013-02-03 (×2): 10 mg via ORAL
  Filled 2013-02-02 (×2): qty 1

## 2013-02-02 NOTE — Progress Notes (Signed)
[facility] provided patient with a list of primary care resources and information about the Affordable Care Act.

## 2013-02-02 NOTE — ED Notes (Signed)
Kim admission nurse at bedside

## 2013-02-02 NOTE — Procedures (Signed)
Preop diagnosis: Angioedema Postop diagnosis: same Procedure: Transnasal fiberoptic laryngoscopy Surgeon: Jenne Pane Anesth: None Comp: None Findings:  Edema is isolate to the oral cavity.  There is no edema of the pharynx or larynx. Description: After discussing risks, benefits, and alternatives, the fiberoptic scope was passed through the right nasal passage to view the pharynx and larynx.  Findings above.  The scope was removed and he was returned to nursing care in stable condition.

## 2013-02-02 NOTE — Progress Notes (Signed)
UR completed 
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
## 2013-02-02 NOTE — Progress Notes (Signed)
Patient declines MyChart activation-No computer at home

## 2013-02-02 NOTE — Telephone Encounter (Signed)
Pt seen on 02/01/13 and says he was supposed to get a smoking cessation script.  He would like it sent to [facility] on [address]. Please f/u with pt to confirm whether this was discussed during office visit.
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
## 2013-02-02 NOTE — ED Notes (Signed)
MD at bedside. [facility] 

## 2013-02-02 NOTE — ED Notes (Addendum)
Patient's tongue has began to swell to the size of a marble anteriorly. Romeo Apple, MD, made aware. MD at bedside evaluating swelling.
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
## 2013-02-03 LAB — CBC
HCT: 37.2 % — ABNORMAL LOW (ref 39.0–52.0)
Hemoglobin: 12.7 g/dL — ABNORMAL LOW (ref 13.0–17.0)
MCH: 31.6 pg (ref 26.0–34.0)
MCHC: 34.1 g/dL (ref 30.0–36.0)
MCV: 92.5 fL (ref 78.0–100.0)
Platelets: 207 10*3/uL (ref 150–400)
RBC: 4.02 MIL/uL — ABNORMAL LOW (ref 4.22–5.81)
RDW: 13.1 % (ref 11.5–15.5)
WBC: 6.7 10*3/uL (ref 4.0–10.5)

## 2013-02-03 LAB — COMPREHENSIVE METABOLIC PANEL
ALT: 14 U/L (ref 0–53)
AST: 16 U/L (ref 0–37)
Albumin: 3.4 g/dL — ABNORMAL LOW (ref 3.5–5.2)
Alkaline Phosphatase: 86 U/L (ref 39–117)
BUN: 14 mg/dL (ref 6–23)
CO2: 19 mEq/L (ref 19–32)
Calcium: 9.3 mg/dL (ref 8.4–10.5)
Chloride: 104 mEq/L (ref 96–112)
Creatinine, Ser: 0.8 mg/dL (ref 0.50–1.35)
GFR calc Af Amer: >90 mL/min (ref >90)
GFR calc non Af Amer: >90 mL/min (ref >90)
Glucose, Bld: 147 mg/dL — ABNORMAL HIGH (ref 70–99)
Potassium: 4.2 mEq/L (ref 3.5–5.1)
Sodium: 135 mEq/L (ref 135–145)
Total Bilirubin: 0.3 mg/dL (ref 0.3–1.2)
Total Protein: 7.8 g/dL (ref 6.0–8.3)

## 2013-02-03 LAB — GLUCOSE, CAPILLARY: Glucose-Capillary: 142 mg/dL — ABNORMAL HIGH (ref 70–99)

## 2013-02-03 MED ORDER — LORATADINE 10 MG PO TABS: 10.0000 mg | ORAL_TABLET | Freq: Every day | ORAL | Status: DC

## 2013-02-03 MED ORDER — PREDNISONE 10 MG PO TABS: ORAL_TABLET | ORAL | Status: DC

## 2013-02-03 MED ORDER — FAMOTIDINE 20 MG PO TABS: 20.0000 mg | ORAL_TABLET | Freq: Two times a day (BID) | ORAL | Status: DC
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
## 2013-02-03 NOTE — Progress Notes (Signed)
Pt discharged home. Pt picked up by a friend after being brought downstairs in a wheelchair. Iv removed before discharge.  Ernestine Conrad RN,BSN

## 2013-02-03 NOTE — Progress Notes (Signed)
Patient ID: [name], male   DOB: 08-May-1956, 57 y.o.   MRN: [id number] Feels good today.  No more sore throat or tongue swelling. Exam: fully resolved tongue and floor of mouth edema, normal voice and breathing. A/P: Angioedema Agree with discharge.

## 2013-02-05 LAB — GLUCOSE, CAPILLARY: Glucose-Capillary: 151 mg/dL — ABNORMAL HIGH (ref 70–99)

## 2013-02-17 ENCOUNTER — Encounter (HOSPITAL_COMMUNITY): Payer: Self-pay | Admitting: Emergency Medicine

## 2013-02-17 ENCOUNTER — Emergency Department (HOSPITAL_COMMUNITY)
Admission: EM | Admit: 2013-02-17 | Discharge: 2013-02-18 | Discharge status: Against Medical Advice | Payer: Self-pay | Attending: Emergency Medicine | Admitting: Emergency Medicine

## 2013-02-17 DIAGNOSIS — S1096XA Insect bite of unspecified part of neck, initial encounter: Secondary | ICD-10-CM | POA: Insufficient documentation

## 2013-02-17 DIAGNOSIS — Y929 Unspecified place or not applicable: Secondary | ICD-10-CM | POA: Insufficient documentation

## 2013-02-17 DIAGNOSIS — Y939 Activity, unspecified: Secondary | ICD-10-CM | POA: Insufficient documentation

## 2013-02-17 DIAGNOSIS — T63461A Toxic effect of venom of wasps, accidental (unintentional), initial encounter: Secondary | ICD-10-CM | POA: Insufficient documentation

## 2013-02-17 DIAGNOSIS — W57XXXA Bitten or stung by nonvenomous insect and other nonvenomous arthropods, initial encounter: Secondary | ICD-10-CM | POA: Insufficient documentation

## 2013-02-17 NOTE — ED Notes (Signed)
Patient reports that he was stung by a bee on his left eye. The patient has noted swelling to his left eye. Reports that he took some pain medication

## 2013-05-04 ENCOUNTER — Ambulatory Visit: Payer: Self-pay

## 2013-05-11 ENCOUNTER — Ambulatory Visit: Payer: Self-pay

## 2013-06-29 ENCOUNTER — Encounter (HOSPITAL_COMMUNITY): Payer: Self-pay | Admitting: Emergency Medicine

## 2013-06-29 ENCOUNTER — Emergency Department (HOSPITAL_COMMUNITY)
Admission: EM | Admit: 2013-06-29 | Discharge: 2013-06-29 | Disposition: A | Discharge status: Home / Self Care | Payer: BC Managed Care – PPO | Attending: Emergency Medicine | Admitting: Emergency Medicine

## 2013-06-29 ENCOUNTER — Emergency Department (HOSPITAL_COMMUNITY): Payer: BC Managed Care – PPO

## 2013-06-29 DIAGNOSIS — M7022 Olecranon bursitis, left elbow: Secondary | ICD-10-CM

## 2013-06-29 DIAGNOSIS — M25539 Pain in unspecified wrist: Secondary | ICD-10-CM | POA: Insufficient documentation

## 2013-06-29 DIAGNOSIS — M109 Gout, unspecified: Secondary | ICD-10-CM | POA: Insufficient documentation

## 2013-06-29 DIAGNOSIS — M129 Arthropathy, unspecified: Secondary | ICD-10-CM | POA: Insufficient documentation

## 2013-06-29 DIAGNOSIS — K219 Gastro-esophageal reflux disease without esophagitis: Secondary | ICD-10-CM | POA: Insufficient documentation

## 2013-06-29 DIAGNOSIS — R209 Unspecified disturbances of skin sensation: Secondary | ICD-10-CM | POA: Insufficient documentation

## 2013-06-29 DIAGNOSIS — Z79899 Other long term (current) drug therapy: Secondary | ICD-10-CM | POA: Insufficient documentation

## 2013-06-29 DIAGNOSIS — M7020 Olecranon bursitis, unspecified elbow: Secondary | ICD-10-CM | POA: Insufficient documentation

## 2013-06-29 DIAGNOSIS — I10 Essential (primary) hypertension: Secondary | ICD-10-CM | POA: Insufficient documentation

## 2013-06-29 DIAGNOSIS — F17200 Nicotine dependence, unspecified, uncomplicated: Secondary | ICD-10-CM | POA: Insufficient documentation

## 2013-06-29 DIAGNOSIS — E119 Type 2 diabetes mellitus without complications: Secondary | ICD-10-CM | POA: Insufficient documentation

## 2013-06-29 NOTE — ED Notes (Signed)
Pt c/o pain starting last Thursday, states pain is in left elbow and left wrist, pt has had gout before and possibly same type of pain
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
## 2013-07-02 LAB — BODY FLUID CULTURE: Culture: NO GROWTH

## 2013-07-05 NOTE — ED Provider Notes (Signed)
Medical screening examination/treatment/procedure(s) were performed by non-physician practitioner and as supervising physician I was immediately available for consultation/collaboration.  EKG Interpretation   None         Houston Siren, MD 07/05/13 [phone]

## 2014-03-03 ENCOUNTER — Encounter (HOSPITAL_COMMUNITY): Payer: Self-pay | Admitting: Emergency Medicine

## 2014-03-03 ENCOUNTER — Emergency Department (HOSPITAL_COMMUNITY)
Admission: EM | Admit: 2014-03-03 | Discharge: 2014-03-03 | Disposition: A | Discharge status: Home / Self Care | Payer: BC Managed Care – PPO | Attending: Emergency Medicine | Admitting: Emergency Medicine

## 2014-03-03 DIAGNOSIS — X503XXA Overexertion from repetitive movements, initial encounter: Secondary | ICD-10-CM | POA: Diagnosis not present

## 2014-03-03 DIAGNOSIS — Y9289 Other specified places as the place of occurrence of the external cause: Secondary | ICD-10-CM | POA: Diagnosis not present

## 2014-03-03 DIAGNOSIS — M109 Gout, unspecified: Secondary | ICD-10-CM | POA: Diagnosis not present

## 2014-03-03 DIAGNOSIS — IMO0002 Reserved for concepts with insufficient information to code with codable children: Secondary | ICD-10-CM | POA: Insufficient documentation

## 2014-03-03 DIAGNOSIS — K219 Gastro-esophageal reflux disease without esophagitis: Secondary | ICD-10-CM | POA: Diagnosis not present

## 2014-03-03 DIAGNOSIS — M129 Arthropathy, unspecified: Secondary | ICD-10-CM | POA: Insufficient documentation

## 2014-03-03 DIAGNOSIS — E119 Type 2 diabetes mellitus without complications: Secondary | ICD-10-CM | POA: Diagnosis not present

## 2014-03-03 DIAGNOSIS — F17200 Nicotine dependence, unspecified, uncomplicated: Secondary | ICD-10-CM | POA: Diagnosis not present

## 2014-03-03 DIAGNOSIS — Y9389 Activity, other specified: Secondary | ICD-10-CM | POA: Diagnosis not present

## 2014-03-03 DIAGNOSIS — S335XXA Sprain of ligaments of lumbar spine, initial encounter: Secondary | ICD-10-CM | POA: Insufficient documentation

## 2014-03-03 DIAGNOSIS — I10 Essential (primary) hypertension: Secondary | ICD-10-CM | POA: Insufficient documentation

## 2014-03-03 DIAGNOSIS — Z79899 Other long term (current) drug therapy: Secondary | ICD-10-CM | POA: Insufficient documentation

## 2014-03-03 DIAGNOSIS — S39012A Strain of muscle, fascia and tendon of lower back, initial encounter: Secondary | ICD-10-CM

## 2014-03-03 DIAGNOSIS — X500XXA Overexertion from strenuous movement or load, initial encounter: Secondary | ICD-10-CM | POA: Diagnosis not present

## 2014-03-03 MED ORDER — DEXAMETHASONE SODIUM PHOSPHATE 10 MG/ML IJ SOLN
10.0000 mg | Freq: Once | INTRAMUSCULAR | Status: AC
  Administered 2014-03-03: 10 mg via INTRAMUSCULAR
  Filled 2014-03-03: qty 1

## 2014-03-03 MED ORDER — KETOROLAC TROMETHAMINE 60 MG/2ML IM SOLN
60.0000 mg | Freq: Once | INTRAMUSCULAR | Status: AC
  Administered 2014-03-03: 60 mg via INTRAMUSCULAR
  Filled 2014-03-03: qty 2

## 2014-03-03 MED ORDER — NAPROXEN 500 MG PO TABS: 500.0000 mg | ORAL_TABLET | Freq: Two times a day (BID) | ORAL | Status: DC

## 2014-03-03 NOTE — ED Notes (Signed)
Pt states 2 weeks ago he reached down to pick something up and he felt like something pulled, has been here multiple times for back pain.
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
## 2014-03-03 NOTE — Discharge Instructions (Signed)
He was given 2 shots for her back pain today. The patient may make your blood sugars elevate temporarily. This should resolve within a few days. Please read the information below regarding back pain, back injury prevention and back exercises.  Abdominal (belly) pain can be caused by many things. Your caregiver performed an examination and possibly ordered blood/urine tests and imaging (CT scan, x-rays, ultrasound). Many cases can be observed and treated at home after initial evaluation in the emergency department. Even though you are being discharged home, abdominal pain can be unpredictable. Therefore, you need a repeated exam if your pain does not resolve, returns, or worsens. Most patients with abdominal pain don't have to be admitted to the hospital or have surgery, but serious problems like appendicitis and gallbladder attacks can start out as nonspecific pain. Many abdominal conditions cannot be diagnosed in one visit, so follow-up evaluations are very important. SEEK IMMEDIATE MEDICAL ATTENTION IF: The pain does not go away or becomes severe.  A temperature above 101 develops.  Repeated vomiting occurs (multiple episodes).  The pain becomes localized to portions of the abdomen. The right side could possibly be appendicitis. In an adult, the left lower portion of the abdomen could be colitis or diverticulitis.  Blood is being passed in stools or vomit (bright red or black tarry stools).  Return also if you develop chest pain, difficulty breathing, dizziness or fainting, or become confused, poorly responsive, or inconsolable (young children).   Back Exercises Back exercises help treat and prevent back injuries. The goal of back exercises is to increase the strength of your abdominal and back muscles and the flexibility of your back. These exercises should be started when you no longer have back pain. Back exercises include:  Pelvic Tilt. Lie on your back with your knees bent. Tilt your pelvis  until the lower part of your back is against the floor. Hold this position 5 to 10 sec and repeat 5 to 10 times.  Knee to Chest. Pull first 1 knee up against your chest and hold for 20 to 30 seconds, repeat this with the other knee, and then both knees. This may be done with the other leg straight or bent, whichever feels better.  Sit-Ups or Curl-Ups. Bend your knees 90 degrees. Start with tilting your pelvis, and do a partial, slow sit-up, lifting your trunk only 30 to 45 degrees off the floor. Take at least 2 to 3 seconds for each sit-up. Do not do sit-ups with your knees out straight. If partial sit-ups are difficult, simply do the above but with only tightening your abdominal muscles and holding it as directed.  Hip-Lift. Lie on your back with your knees flexed 90 degrees. Push down with your feet and shoulders as you raise your hips a couple inches off the floor; hold for 10 seconds, repeat 5 to 10 times.  Back arches. Lie on your stomach, propping yourself up on bent elbows. Slowly press on your hands, causing an arch in your low back. Repeat 3 to 5 times. Any initial stiffness and discomfort should lessen with repetition over time.  Shoulder-Lifts. Lie face down with arms beside your body. Keep hips and torso pressed to floor as you slowly lift your head and shoulders off the floor. Do not overdo your exercises, especially in the beginning. Exercises may cause you some mild back discomfort which lasts for a few minutes; however, if the pain is more severe, or lasts for more than 15 minutes, do not continue exercises  until you see your caregiver. Improvement with exercise therapy for back problems is slow.  See your caregivers for assistance with developing a proper back exercise program. Document Released: 07/29/2004 Document Revised: 09/13/2011 Document Reviewed: 04/22/2011 [facility] Patient Information 2015 [place], [place]. This information is not intended to replace advice given to you by your  health care provider. Make sure you discuss any questions you have with your health care provider.  Lumbosacral Strain Lumbosacral strain is a strain of any of the parts that make up your lumbosacral vertebrae. Your lumbosacral vertebrae are the bones that make up the lower third of your backbone. Your lumbosacral vertebrae are held together by muscles and tough, fibrous tissue (ligaments).  CAUSES  A sudden blow to your back can cause lumbosacral strain. Also, anything that causes an excessive stretch of the muscles in the low back can cause this strain. This is typically seen when people exert themselves strenuously, fall, lift heavy objects, bend, or crouch repeatedly. RISK FACTORS  Physically demanding work.  Participation in pushing or pulling sports or sports that require a sudden twist of the back (tennis, golf, baseball).  Weight lifting.  Excessive lower back curvature.  Forward-tilted pelvis.  Weak back or abdominal muscles or both.  Tight hamstrings. SIGNS AND SYMPTOMS  Lumbosacral strain may cause pain in the area of your injury or pain that moves (radiates) down your leg.  DIAGNOSIS Your health care provider can often diagnose lumbosacral strain through a physical exam. In some cases, you may need tests such as X-ray exams.  TREATMENT  Treatment for your lower back injury depends on many factors that your clinician will have to evaluate. However, most treatment will include the use of anti-inflammatory medicines. HOME CARE INSTRUCTIONS   Avoid hard physical activities (tennis, racquetball, waterskiing) if you are not in proper physical condition for it. This may aggravate or create problems.  If you have a back problem, avoid sports requiring sudden body movements. Swimming and walking are generally safer activities.  Maintain good posture.  Maintain a healthy weight.  For acute conditions, you may put ice on the injured area.  Put ice in a plastic bag.  Place a  towel between your skin and the bag.  Leave the ice on for 20 minutes, 2-3 times a day.  When the low back starts healing, stretching and strengthening exercises may be recommended. SEEK MEDICAL CARE IF:  Your back pain is getting worse.  You experience severe back pain not relieved with medicines. SEEK IMMEDIATE MEDICAL CARE IF:   You have numbness, tingling, weakness, or problems with the use of your arms or legs.  There is a change in bowel or bladder control.  You have increasing pain in any area of the body, including your belly (abdomen).  You notice shortness of breath, dizziness, or feel faint.  You feel sick to your stomach (nauseous), are throwing up (vomiting), or become sweaty.  You notice discoloration of your toes or legs, or your feet get very cold. MAKE SURE YOU:   Understand these instructions.  Will watch your condition.  Will get help right away if you are not doing well or get worse. Document Released: 03/31/2005 Document Revised: 06/26/2013 Document Reviewed: 02/07/2013 [facility] Patient Information 2015 [place], [place]. This information is not intended to replace advice given to you by your health care provider. Make sure you discuss any questions you have with your health care provider. Back Exercises Back exercises help treat and prevent back injuries. The goal of  back exercises is to increase the strength of your abdominal and back muscles and the flexibility of your back. These exercises should be started when you no longer have back pain. Back exercises include:  Pelvic Tilt. Lie on your back with your knees bent. Tilt your pelvis until the lower part of your back is against the floor. Hold this position 5 to 10 sec and repeat 5 to 10 times.  Knee to Chest. Pull first 1 knee up against your chest and hold for 20 to 30 seconds, repeat this with the other knee, and then both knees. This may be done with the other leg straight or bent, whichever feels  better.  Sit-Ups or Curl-Ups. Bend your knees 90 degrees. Start with tilting your pelvis, and do a partial, slow sit-up, lifting your trunk only 30 to 45 degrees off the floor. Take at least 2 to 3 seconds for each sit-up. Do not do sit-ups with your knees out straight. If partial sit-ups are difficult, simply do the above but with only tightening your abdominal muscles and holding it as directed.  Hip-Lift. Lie on your back with your knees flexed 90 degrees. Push down with your feet and shoulders as you raise your hips a couple inches off the floor; hold for 10 seconds, repeat 5 to 10 times.  Back arches. Lie on your stomach, propping yourself up on bent elbows. Slowly press on your hands, causing an arch in your low back. Repeat 3 to 5 times. Any initial stiffness and discomfort should lessen with repetition over time.  Shoulder-Lifts. Lie face down with arms beside your body. Keep hips and torso pressed to floor as you slowly lift your head and shoulders off the floor. Do not overdo your exercises, especially in the beginning. Exercises may cause you some mild back discomfort which lasts for a few minutes; however, if the pain is more severe, or lasts for more than 15 minutes, do not continue exercises until you see your caregiver. Improvement with exercise therapy for back problems is slow.  See your caregivers for assistance with developing a proper back exercise program. Document Released: 07/29/2004 Document Revised: 09/13/2011 Document Reviewed: 04/22/2011 [facility] Patient Information 2015 [place], [place]. This information is not intended to replace advice given to you by your health care provider. Make sure you discuss any questions you have with your health care provider. Back Injury Prevention Back injuries can be extremely painful and difficult to heal. After having one back injury, you are much more likely to experience another later on. It is important to learn how to avoid injuring or  re-injuring your back. The following tips can help you to prevent a back injury. PHYSICAL FITNESS  Exercise regularly and try to develop good tone in your abdominal muscles. Your abdominal muscles provide a lot of the support needed by your back.  Do aerobic exercises (walking, jogging, biking, swimming) regularly.  Do exercises that increase balance and strength (tai chi, yoga) regularly. This can decrease your risk of falling and injuring your back.  Stretch before and after exercising.  Maintain a healthy weight. The more you weigh, the more stress is placed on your back. For every pound of weight, 10 times that amount of pressure is placed on the back. DIET  Talk to your caregiver about how much calcium and vitamin D you need per day. These nutrients help to prevent weakening of the bones (osteoporosis). Osteoporosis can cause broken (fractured) bones that lead to back pain.  Include good  sources of calcium in your diet, such as dairy products, green, leafy vegetables, and products with calcium added (fortified).  Include good sources of vitamin D in your diet, such as milk and foods that are fortified with vitamin D.  Consider taking a nutritional supplement or a multivitamin if needed.  Stop smoking if you smoke. POSTURE  Sit and stand up straight. Avoid leaning forward when you sit or hunching over when you stand.  Choose chairs with good low back (lumbar) support.  If you work at a desk, sit close to your work so you do not need to lean over. Keep your chin tucked in. Keep your neck drawn back and elbows bent at a right angle. Your arms should look like the letter "L."  Sit high and close to the steering wheel when you drive. Add a lumbar support to your car seat if needed.  Avoid sitting or standing in one position for too long. Take breaks to get up, stretch, and walk around at least once every hour. Take breaks if you are driving for long periods of time.  Sleep on your  side with your knees slightly bent, or sleep on your back with a pillow under your knees. Do not sleep on your stomach. LIFTING, TWISTING, AND REACHING  Avoid heavy lifting, especially repetitive lifting. If you must do heavy lifting:  Stretch before lifting.  Work slowly.  Rest between lifts.  Use carts and dollies to move objects when possible.  Make several small trips instead of carrying 1 heavy load.  Ask for help when you need it.  Ask for help when moving big, awkward objects.  Follow these steps when lifting:  Stand with your feet shoulder-width apart.  Get as close to the object as you can. Do not try to pick up heavy objects that are far from your body.  Use handles or lifting straps if they are available.  Bend at your knees. Squat down, but keep your heels off the floor.  Keep your shoulders pulled back, your chin tucked in, and your back straight.  Lift the object slowly, tightening the muscles in your legs, abdomen, and buttocks. Keep the object as close to the center of your body as possible.  When you put a load down, use these same guidelines in reverse.  Do not:  Lift the object above your waist.  Twist at the waist while lifting or carrying a load. Move your feet if you need to turn, not your waist.  Bend over without bending at your knees.  Avoid reaching over your head, across a table, or for an object on a high surface. OTHER TIPS  Avoid wet floors and keep sidewalks clear of ice to prevent falls.  Do not sleep on a mattress that is too soft or too hard.  Keep items that are used frequently within easy reach.  Put heavier objects on shelves at waist level and lighter objects on lower or higher shelves.  Find ways to decrease your stress, such as exercise, massage, or relaxation techniques. Stress can build up in your muscles. Tense muscles are more vulnerable to injury.  Seek treatment for depression or anxiety if needed. These conditions  can increase your risk of developing back pain. SEEK MEDICAL CARE IF:  You injure your back.  You have questions about diet, exercise, or other ways to prevent back injuries. MAKE SURE YOU:  Understand these instructions.  Will watch your condition.  Will get help right away if  you are not doing well or get worse. Document Released: 07/29/2004 Document Revised: 09/13/2011 Document Reviewed: 08/02/2011 [facility] Patient Information 2015 [place], [place]. This information is not intended to replace advice given to you by your health care provider. Make sure you discuss any questions you have with your health care provider.

## 2014-03-03 NOTE — ED Provider Notes (Signed)
Medical screening examination/treatment/procedure(s) were performed by non-physician practitioner and as supervising physician I was immediately available for consultation/collaboration.   EKG Interpretation None       Orlie Dakin, MD 03/03/14 [phone]

## 2014-03-13 ENCOUNTER — Ambulatory Visit (HOSPITAL_COMMUNITY)
Admission: RE | Admit: 2014-03-13 | Discharge: 2014-03-13 | Disposition: A | Discharge status: Home / Self Care | Payer: BC Managed Care – PPO | Source: Ambulatory Visit | Attending: Internal Medicine | Admitting: Internal Medicine

## 2014-03-13 ENCOUNTER — Ambulatory Visit: Payer: BC Managed Care – PPO | Attending: Internal Medicine | Admitting: Internal Medicine

## 2014-03-13 ENCOUNTER — Encounter: Payer: Self-pay | Admitting: Internal Medicine

## 2014-03-13 VITALS — BP 149/98 | HR 64 | Temp 98.1°F | Resp 16 | Ht 74.0 in | Wt 239.0 lb

## 2014-03-13 DIAGNOSIS — R7309 Other abnormal glucose: Secondary | ICD-10-CM

## 2014-03-13 DIAGNOSIS — I10 Essential (primary) hypertension: Secondary | ICD-10-CM

## 2014-03-13 DIAGNOSIS — K219 Gastro-esophageal reflux disease without esophagitis: Secondary | ICD-10-CM | POA: Diagnosis not present

## 2014-03-13 DIAGNOSIS — E785 Hyperlipidemia, unspecified: Secondary | ICD-10-CM | POA: Insufficient documentation

## 2014-03-13 DIAGNOSIS — Z23 Encounter for immunization: Secondary | ICD-10-CM

## 2014-03-13 DIAGNOSIS — M7989 Other specified soft tissue disorders: Secondary | ICD-10-CM

## 2014-03-13 DIAGNOSIS — F17200 Nicotine dependence, unspecified, uncomplicated: Secondary | ICD-10-CM | POA: Insufficient documentation

## 2014-03-13 DIAGNOSIS — E119 Type 2 diabetes mellitus without complications: Secondary | ICD-10-CM

## 2014-03-13 DIAGNOSIS — R739 Hyperglycemia, unspecified: Secondary | ICD-10-CM

## 2014-03-13 DIAGNOSIS — M19049 Primary osteoarthritis, unspecified hand: Secondary | ICD-10-CM | POA: Insufficient documentation

## 2014-03-13 DIAGNOSIS — M109 Gout, unspecified: Secondary | ICD-10-CM | POA: Diagnosis not present

## 2014-03-13 DIAGNOSIS — Z79899 Other long term (current) drug therapy: Secondary | ICD-10-CM | POA: Insufficient documentation

## 2014-03-13 LAB — POCT URINALYSIS DIPSTICK
Bilirubin, UA: NEGATIVE
Glucose, UA: 500
Ketones, UA: NEGATIVE
Leukocytes, UA: NEGATIVE
Nitrite, UA: NEGATIVE
Protein, UA: NEGATIVE
Spec Grav, UA: <=1.005
Urobilinogen, UA: 0.2
pH, UA: 6

## 2014-03-13 LAB — GLUCOSE, POCT (MANUAL RESULT ENTRY)
POC Glucose: 524 mg/dl — AB (ref 70–99)
POC Glucose: 487 mg/dl — AB (ref 70–99)
POC Glucose: 328 mg/dl — AB (ref 70–99)

## 2014-03-13 LAB — POCT GLYCOSYLATED HEMOGLOBIN (HGB A1C): Hemoglobin A1C: 10.8

## 2014-03-13 MED ORDER — INSULIN ASPART 100 UNIT/ML ~~LOC~~ SOLN
20.0000 [IU] | Freq: Once | SUBCUTANEOUS | Status: AC
  Administered 2014-03-13: 20 [IU] via SUBCUTANEOUS

## 2014-03-13 MED ORDER — GLUCOSE BLOOD VI STRP: ORAL_STRIP | Status: DC

## 2014-03-13 MED ORDER — INSULIN GLARGINE 100 UNIT/ML SOLOSTAR PEN: 10.0000 [IU] | PEN_INJECTOR | Freq: Every day | SUBCUTANEOUS | Status: DC

## 2014-03-13 MED ORDER — METFORMIN HCL 1000 MG PO TABS: 1000.0000 mg | ORAL_TABLET | Freq: Two times a day (BID) | ORAL | Status: DC

## 2014-03-13 MED ORDER — LOSARTAN POTASSIUM 50 MG PO TABS: 50.0000 mg | ORAL_TABLET | Freq: Every day | ORAL | Status: DC

## 2014-03-13 MED ORDER — SODIUM CHLORIDE 0.9 % IV SOLN
Freq: Once | INTRAVENOUS | Status: AC
  Administered 2014-03-13: 13:00:00 via INTRAVENOUS

## 2014-03-13 MED ORDER — SODIUM CHLORIDE 0.9 % IV SOLN: INTRAVENOUS | Status: DC

## 2014-03-13 MED ORDER — FREESTYLE SYSTEM KIT: 1.0000 | PACK | Status: DC | PRN

## 2014-03-13 MED ORDER — AMLODIPINE BESYLATE 10 MG PO TABS: 10.0000 mg | ORAL_TABLET | Freq: Every day | ORAL | Status: DC

## 2014-03-13 MED ORDER — ALLOPURINOL 300 MG PO TABS: 300.0000 mg | ORAL_TABLET | Freq: Every day | ORAL | Status: DC

## 2014-03-13 MED ORDER — FREESTYLE LANCETS MISC: Status: DC

## 2014-03-13 NOTE — Progress Notes (Signed)
Pt is here to establish care. Pt is here to manage his diabetes and HTN. Pt reports having lower back pain. Pt states that when he goes to the bathroom he notices blood w/ itching around his anus.  Pt states that he goes frequently mostly at night.

## 2014-03-13 NOTE — Patient Instructions (Signed)
Please check BS before meals and at bedtime.   Will return in 2 weeks with BS log and for additional questions

## 2014-03-13 NOTE — Progress Notes (Signed)
Patient ID: [name], male   DOB: [birth year], 58 y.o.   MRN: [id number]  CC: HTN, DM  HPI:  Patient presents to clinic today for a follow up of HTN and DM.  Patient has not been seen in this clinic in over one year.  He has multiple complaints today.  He reports that he has been working in [place] for the past 9 months and was seeing a provider down there for his DM management.  He has on been taking Metformin 500 mg once per day. He states that he often forgets to take his medication because he works long hours.  He reports that he has been out of allupurinol and norvasc for a couple of weeks.  He reports that he believes his gout has flared in his right thumb.  He states that his thumb has been hurting and causing a sharp pain to radiate up his right arm.  His CBG today is 524 and he reports that his lancet device has been broken and he has been unable to check his BS.  He reportedly ate some noodles this AM. Has had biannual dental exam. No eye exam in years.   Allergies  Allergen Reactions  . Ace Inhibitors     Angioedema   . Aceite Annita Brod Oil]    Past Medical History  Diagnosis Date  . Acid reflux   . Gout   . Hypertension   . Diabetes mellitus   . Hyperlipidemia   . Incarcerated ventral hernia   . Mouth bleeding     upper teeth right back side loose and bleed at night  . Arthritis    Current Outpatient Prescriptions on File Prior to Visit  Medication Sig Dispense Refill  . albuterol (PROVENTIL HFA;VENTOLIN HFA) 108 (90 BASE) MCG/ACT inhaler Inhale 2 puffs into the lungs every 6 (six) hours as needed for wheezing or shortness of breath.  1 Inhaler  4  . allopurinol (ZYLOPRIM) 300 MG tablet Take 1 tablet (300 mg total) by mouth daily.  30 tablet  4  . amLODipine (NORVASC) 10 MG tablet Take 1 tablet (10 mg total) by mouth daily.  30 tablet  4  . docusate sodium (COLACE) 100 MG capsule Take 1 capsule (100 mg total) by mouth 2 (two) times daily.  100 capsule  3  .  indomethacin (INDOCIN) 50 MG capsule Take 1 capsule (50 mg total) by mouth 3 (three) times daily as needed. For gout flares.  30 capsule  2  . metFORMIN (GLUCOPHAGE) 500 MG tablet Take 1 tablet (500 mg total) by mouth 2 (two) times daily with a meal.  60 tablet  4  . naproxen (NAPROSYN) 500 MG tablet Take 1 tablet (500 mg total) by mouth 2 (two) times daily with a meal.  30 tablet  0  . famotidine (PEPCID) 20 MG tablet Take 1 tablet (20 mg total) by mouth 2 (two) times daily.  14 tablet  0  . omeprazole (PRILOSEC) 20 MG capsule Take 20 mg by mouth daily.       No current facility-administered medications on file prior to visit.   Family History  Problem Relation Age of Onset  . Diabetes Mother   . Cancer Father   . Multiple sclerosis Sister   . Heart failure Brother    History   Social History  . Marital Status: Single    Spouse Name: N/A    Number of Children: N/A  . Years of Education: N/A  Occupational History  . Not on file.   Social History Main Topics  . Smoking status: Current Every Day Smoker -- 0.50 packs/day    Types: Cigarettes  . Smokeless tobacco: Never Used  . Alcohol Use: 0.0 oz/week     Comment: weekends only  . Drug Use: No  . Sexual Activity: No   Other Topics Concern  . Not on file   Social History Narrative  . No narrative on file    Review of Systems: Constitutional: Negative for fever, chills, diaphoresis, activity change, appetite change and fatigue. HENT: Negative for ear pain, nosebleeds, congestion, facial swelling, rhinorrhea, neck pain, neck stiffness and ear discharge.  Eyes: Negative for pain, discharge, redness, itching and visual disturbance. Respiratory: Negative for cough, choking, chest tightness, shortness of breath, wheezing and stridor.  Cardiovascular: Negative for chest pain, palpitations and leg swelling. Gastrointestinal: Negative for abdominal distention. Genitourinary: Negative for dysuria, urgency, frequency, hematuria,  flank pain, decreased urine volume, difficulty urinating and dyspareunia.  Musculoskeletal: Negative for back pain, joint swelling, arthralgias and gait problem. Neurological: Negative for dizziness, tremors, seizures, syncope, facial asymmetry, speech difficulty, weakness, light-headedness, numbness and headaches.  Hematological: Negative for adenopathy. Does not bruise/bleed easily. Psychiatric/Behavioral: Negative for hallucinations, behavioral problems, confusion, dysphoric mood, decreased concentration and agitation.    Objective:   Filed Vitals:   03/13/14 1152  BP: 149/98  Pulse: 64  Temp: 98.1 F (36.7 C)  Resp: 16    Physical Exam: Constitutional: Patient appears well-developed and well-nourished. No distress. HENT: Normocephalic, atraumatic, External right and left ear normal. Oropharynx is clear and moist.  Eyes: Conjunctivae and EOM are normal. PERRLA, no scleral icterus. Neck: Normal ROM. Neck supple. No JVD. No tracheal deviation. No thyromegaly. CVS: RRR, S1/S2 +, no murmurs, no gallops, no carotid bruit.  Pulmonary: Effort and breath sounds normal, no stridor, rhonchi, wheezes, rales.  Abdominal: Soft. BS +,  no distension, tenderness, rebound or guarding.  Musculoskeletal: Normal range of motion. No edema and no tenderness.  Lymphadenopathy: No lymphadenopathy noted, cervical Neuro: Alert. Normal reflexes, muscle tone coordination. No cranial nerve deficit. Skin: Skin is warm and dry. No rash noted. Not diaphoretic. No erythema. No pallor. Psychiatric: Normal mood and affect. Behavior, judgment, thought content normal.  Lab Results  Component Value Date   WBC 6.7 02/03/2013   HGB 12.7* 02/03/2013   HCT 37.2* 02/03/2013   MCV 92.5 02/03/2013   PLT 207 02/03/2013   Lab Results  Component Value Date   CREATININE 0.80 02/03/2013   BUN 14 02/03/2013   NA 135 02/03/2013   K 4.2 02/03/2013   CL 104 02/03/2013   CO2 19 02/03/2013    Lab Results  Component Value Date   HGBA1C  6.4* 12/29/2012   Lipid Panel     Component Value Date/Time   CHOL 167 12/29/2012 1829   TRIG 201* 12/29/2012 1829   HDL 36* 12/29/2012 1829   CHOLHDL 4.6 12/29/2012 1829   VLDL 40 12/29/2012 1829   LDLCALC 91 12/29/2012 1829       Assessment and plan:   [name] was seen today for establish care.  Diagnoses and associated orders for this visit:  Type 2 diabetes mellitus without complication - Glucose (CBG) - HgB A1c - metFORMIN (GLUCOPHAGE) 1000 MG tablet; Take 1 tablet (1,000 mg total) by mouth 2 (two) times daily with a meal. - Ambulatory referral to Ophthalmology - Amb Referral to Nutrition and Diabetic E - Insulin Glargine (LANTUS SOLOSTAR) 100 UNIT/ML Solostar Pen; Inject  10 Units into the skin daily at 10 pm. - glucose monitoring kit (FREESTYLE) monitoring kit; 1 each by Does not apply route as needed for other. - glucose blood test strip; Use as instructed - Lancets (FREESTYLE) lancets; Use as instructed - insulin aspart (novoLOG) injection 20 Units; Inject 0.2 mLs (20 Units total) into the skin once. - Glucose (CBG) - Urinalysis Dipstick - Insulin Pen Needle (PEN NEEDLES) 31G X 6 MM MISC; Use lantus every night at hours of sleep  Essential hypertension - Refilled amLODipine (NORVASC) 10 MG tablet; Take 1 tablet (10 mg total) by mouth daily. - Refilled losartan (COZAAR) 50 MG tablet; Take 1 tablet (50 mg total) by mouth daily.  Gout of foot, unspecified cause, unspecified chronicity, unspecified laterality - Refilled allopurinol (ZYLOPRIM) 300 MG tablet; Take 1 tablet (300 mg total) by mouth daily.  Swelling of right thumb - DG Finger Thumb Right; Future  Hyperglycemia - Glucose (CBG) - 0.9 %  sodium chloride infusion; Inject into the vein continuous. - Insert peripheral IV; Standing - Insert peripheral IV  Need for prophylactic vaccination and inoculation against influenza Received     Return in about 2 weeks (around 03/27/2014) for Diabetes Mellitus-CBG check  and itching with PCP.       Chari Manning, NP-C [facility] [phone] 03/13/2014, 12:11 PM

## 2014-03-14 ENCOUNTER — Telehealth: Payer: Self-pay | Admitting: Emergency Medicine

## 2014-03-14 ENCOUNTER — Other Ambulatory Visit: Payer: Self-pay | Admitting: Internal Medicine

## 2014-03-14 NOTE — Telephone Encounter (Signed)
Left message for pt to call when message received

## 2014-03-14 NOTE — Telephone Encounter (Signed)
Message copied by Ricci Barker on Thu Mar 14, 2014  2:28 PM ------      Message from: Chari Manning A      Created: Thu Mar 14, 2014  7:57 AM       Advanced osteoarthritis of the thumb and joint. He can use his naproxen he had in his medication bag yesterday two times per day for one week and see if that improves the swelling and pain. Do not go past a week on the naproxen so it does not increase his blood pressure too much. Thanks. I will re-evaluate on his f/u visit in 2 weeks ------

## 2014-03-15 MED ORDER — PEN NEEDLES 31G X 6 MM MISC: Status: DC

## 2014-03-16 ENCOUNTER — Encounter: Payer: Self-pay | Admitting: Internal Medicine

## 2014-03-20 ENCOUNTER — Encounter: Payer: BC Managed Care – PPO | Attending: Internal Medicine | Admitting: *Deleted

## 2014-03-20 ENCOUNTER — Encounter: Payer: Self-pay | Admitting: *Deleted

## 2014-03-20 VITALS — Ht 74.0 in | Wt 240.9 lb

## 2014-03-20 DIAGNOSIS — Z794 Long term (current) use of insulin: Secondary | ICD-10-CM | POA: Insufficient documentation

## 2014-03-20 DIAGNOSIS — Z713 Dietary counseling and surveillance: Secondary | ICD-10-CM | POA: Diagnosis not present

## 2014-03-20 DIAGNOSIS — E119 Type 2 diabetes mellitus without complications: Secondary | ICD-10-CM | POA: Diagnosis not present

## 2014-03-20 NOTE — Progress Notes (Signed)
Appt start time: 1630 end time:  1800.  Assessment:  Patient was seen on  03/13/14 for individual diabetes education. [name] is presently on medical leave from [facility] [place]. He is scheduled for abdominal hernia repair 04/08/14.[name] has had T2DM since 2000. He has been recently hospitalized due to lack of management. He is now on insulin. He notes that his FBS and pre-meal readings are in the low 100's. He states he is testing FBS and prior to each meal. His eating pattern is not regular.When asked if he was ready to make some changes he stated "Done it before, just need to get back on it". [name] was transported to our location by neighbors who seem to be engaged with his care. They appear most supportive.  Patient Education Plan per assessed needs and concerns is to attend individual session for Diabetes Self Management Education.   Current HbA1c: 10.8%  Preferred Learning Style:   No preference indicated   Learning Readiness:   Ready  MEDICATIONS: Metformin, Lantus  DIETARY INTAKE:Fries food in vegetable oil   B ( AM): egg, sausage, 2 bread, coffee, 2% milk, grits Snk ( AM): none  L ( PM): pork chop, rice, string beans, biscuits X2/  Chicken, vegetable, potatoes Snk ( PM): none D ( PM): cabbage, chicken, pork chop Snk ( PM): dinner left overs Beverages: 2% milk, diet soda, water  Usual physical activity: None at this time    Intervention:  Nutrition counseling provided.  Discussed diabetes disease process and treatment options.  Discussed physiology of diabetes and role of obesity on insulin resistance.  Encouraged moderate weight reduction to improve glucose levels.  Discussed role of medications and diet in glucose control  Provided education on macronutrients on glucose levels.  Provided education on carb counting, importance of regularly scheduled meals/snacks, and meal planning  Discussed effects of physical activity on glucose levels and long-term glucose  control.  Recommended 150 minutes of physical activity/week.  Reviewed patient medications.  Discussed role of medication on blood glucose and possible side effects  Discussed blood glucose monitoring and interpretation.  Discussed recommended target ranges and individual ranges.    Described short-term complications: hyper- and hypo-glycemia.  Discussed causes,symptoms, and treatment options.  Discussed prevention, detection, and treatment of long-term complications.  Discussed the role of prolonged elevated glucose levels on body systems.  Discussed role of stress on blood glucose levels and discussed strategies to manage psychosocial issues.  Discussed recommendations for long-term diabetes self-care.  Established checklist for medical, dental, and emotional self-care.  Plan:  Aim for 3 Carb Choices per meal (45 grams) +/- 1 either way  Aim for 0-15 Carbs per snack if hungry  Include protein in moderation with your meals and snacks Consider reading food labels for Total Carbohydrate and Fat Grams of foods Consider  increasing your activity level by walking for 30 minutes daily as tolerated Consider checking BG at alternate times per day to include FBS and befor meals as directed by MD  Continue taking medication as directed by MD  Decrease the amount of fried foods you eat Trim fat off of your meat Try to stay away from biscuits Try english muffin at [employer])  Teaching Method Utilized:  Visual Auditory Hands on  Handouts given during visit include: Living Well with Diabetes Carb Counting and Food Label handouts Meal Plan Card My Plate  Barriers to learning/adherence to lifestyle change: finances, committment  Diabetes self-care support plan:   [facility] support group  friends  Demonstrated degree of understanding via:  Teach Back   Monitoring/Evaluation:  Dietary intake, exercise, test glucose, and body weight prn.

## 2014-03-20 NOTE — Patient Instructions (Addendum)
Plan:  Aim for 3 Carb Choices per meal (45 grams) +/- 1 either way  Aim for 0-15 Carbs per snack if hungry  Include protein in moderation with your meals and snacks Consider reading food labels for Total Carbohydrate and Fat Grams of foods Consider  increasing your activity level by walking for 30 minutes daily as tolerated Consider checking BG at alternate times per day to include FBS and befor meals as directed by MD  Continue taking medication as directed by MD  Decrease the amount of fried foods you eat Trim fat off of your meat Try to stay away from biscuits Try english muffin at [employer])

## 2014-03-27 ENCOUNTER — Ambulatory Visit: Payer: BC Managed Care – PPO | Attending: Internal Medicine

## 2014-03-27 DIAGNOSIS — IMO0001 Reserved for inherently not codable concepts without codable children: Secondary | ICD-10-CM

## 2014-03-27 DIAGNOSIS — E1165 Type 2 diabetes mellitus with hyperglycemia: Principal | ICD-10-CM

## 2014-03-27 DIAGNOSIS — E119 Type 2 diabetes mellitus without complications: Secondary | ICD-10-CM

## 2014-03-27 DIAGNOSIS — M19041 Primary osteoarthritis, right hand: Secondary | ICD-10-CM

## 2014-03-27 LAB — GLUCOSE, POCT (MANUAL RESULT ENTRY): POC Glucose: 101 mg/dL — AB (ref 70–99)

## 2014-03-27 MED ORDER — TRAMADOL HCL 50 MG PO TABS: 50.0000 mg | ORAL_TABLET | Freq: Three times a day (TID) | ORAL | Status: DC | PRN

## 2014-03-27 MED ORDER — INSULIN GLARGINE 100 UNIT/ML SOLOSTAR PEN: 12.0000 [IU] | PEN_INJECTOR | Freq: Every day | SUBCUTANEOUS | Status: DC

## 2014-03-27 NOTE — Progress Notes (Unsigned)
Pt comes in for blood sugar recheck/Log with taking prescribed Metformin and Lantus Blood sugars ranging 160-300's CBG- 101 Pt denies freq urination or dizziness today States he is compliant with taking medications Pt c/o right thumb swelling with increased pain unrelieved by prescribed Naprosyn- xray reveals advanced osteoarthritis

## 2014-03-27 NOTE — Patient Instructions (Signed)
Take 12 units Lantus at bedtime and return in 2 weeks for recheck with Log

## 2014-04-03 ENCOUNTER — Ambulatory Visit (INDEPENDENT_AMBULATORY_CARE_PROVIDER_SITE_OTHER): Payer: BC Managed Care – PPO | Admitting: Sports Medicine

## 2014-04-03 ENCOUNTER — Encounter: Payer: Self-pay | Admitting: Sports Medicine

## 2014-04-03 VITALS — BP 117/79 | Ht 74.0 in | Wt 240.0 lb

## 2014-04-03 DIAGNOSIS — M19049 Primary osteoarthritis, unspecified hand: Secondary | ICD-10-CM | POA: Diagnosis not present

## 2014-04-03 DIAGNOSIS — M1A9XX0 Chronic gout, unspecified, without tophus (tophi): Secondary | ICD-10-CM | POA: Diagnosis not present

## 2014-04-03 DIAGNOSIS — M19041 Primary osteoarthritis, right hand: Secondary | ICD-10-CM

## 2014-04-03 DIAGNOSIS — M1A00X0 Idiopathic chronic gout, unspecified site, without tophus (tophi): Secondary | ICD-10-CM | POA: Diagnosis not present

## 2014-04-03 MED ORDER — MELOXICAM 15 MG PO TABS: 15.0000 mg | ORAL_TABLET | Freq: Every day | ORAL | Status: DC
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
## 2014-04-03 NOTE — Patient Instructions (Signed)
Taking medications for 2 weeks then as needed. If you're not better in 4-6 weeks we can consider an injection.

## 2014-04-09 ENCOUNTER — Encounter: Payer: Self-pay | Admitting: *Deleted

## 2014-04-09 NOTE — Progress Notes (Signed)
Pt came in today with questions about his diabetic medications. Pt showed me his BS log. His numbers were great ranging from 90-150. I instructed him to keep taking his medications as prescribed and that he was doing great with his medication regimen.

## 2014-04-11 ENCOUNTER — Other Ambulatory Visit: Payer: BC Managed Care – PPO

## 2014-04-14 ENCOUNTER — Other Ambulatory Visit: Payer: Self-pay | Admitting: Internal Medicine

## 2014-04-30 ENCOUNTER — Other Ambulatory Visit: Payer: Self-pay | Admitting: Sports Medicine

## 2014-05-15 ENCOUNTER — Ambulatory Visit: Payer: BC Managed Care – PPO | Admitting: Sports Medicine

## 2014-06-10 DIAGNOSIS — Z8719 Personal history of other diseases of the digestive system: Secondary | ICD-10-CM | POA: Insufficient documentation

## 2014-08-12 ENCOUNTER — Telehealth: Payer: Self-pay | Admitting: General Practice

## 2014-08-12 NOTE — Telephone Encounter (Signed)
Patient presents to clinic with concerns with pain and swelling in his right hand. Patient states PCP dc'd the following medication for Gout allopurinol (ZYLOPRIM) 300 MG tablet at the time of his last OV and he doesn't know if the flare up is a result of having stopped the medication. Please assist. Informed patient to come in during walk in hours tomorrow, 08/13/14. Patient wanted to see PCP but is not available at the times that Dr. Shirl Harris schedule permits.

## 2014-08-13 ENCOUNTER — Ambulatory Visit (HOSPITAL_COMMUNITY)
Admission: RE | Admit: 2014-08-13 | Discharge: 2014-08-13 | Disposition: A | Discharge status: Home / Self Care | Payer: BC Managed Care – PPO | Source: Ambulatory Visit | Attending: Family Medicine | Admitting: Family Medicine

## 2014-08-13 ENCOUNTER — Emergency Department (HOSPITAL_COMMUNITY)
Admission: EM | Admit: 2014-08-13 | Discharge: 2014-08-13 | Disposition: A | Discharge status: Home / Self Care | Payer: Federal, State, Local not specified - PPO | Source: Home / Self Care | Attending: Emergency Medicine | Admitting: Emergency Medicine

## 2014-08-13 ENCOUNTER — Ambulatory Visit: Payer: Federal, State, Local not specified - PPO | Attending: Family Medicine | Admitting: Family Medicine

## 2014-08-13 ENCOUNTER — Encounter: Payer: Self-pay | Admitting: Family Medicine

## 2014-08-13 VITALS — BP 131/87 | HR 73 | Temp 97.9°F | Resp 16 | Ht 74.0 in | Wt 226.0 lb

## 2014-08-13 DIAGNOSIS — M79641 Pain in right hand: Secondary | ICD-10-CM

## 2014-08-13 DIAGNOSIS — E119 Type 2 diabetes mellitus without complications: Secondary | ICD-10-CM | POA: Diagnosis not present

## 2014-08-13 DIAGNOSIS — M1990 Unspecified osteoarthritis, unspecified site: Secondary | ICD-10-CM | POA: Diagnosis not present

## 2014-08-13 DIAGNOSIS — Z794 Long term (current) use of insulin: Secondary | ICD-10-CM | POA: Diagnosis not present

## 2014-08-13 DIAGNOSIS — M109 Gout, unspecified: Secondary | ICD-10-CM | POA: Insufficient documentation

## 2014-08-13 DIAGNOSIS — Z79899 Other long term (current) drug therapy: Secondary | ICD-10-CM | POA: Diagnosis not present

## 2014-08-13 DIAGNOSIS — M25531 Pain in right wrist: Secondary | ICD-10-CM | POA: Diagnosis not present

## 2014-08-13 DIAGNOSIS — I10 Essential (primary) hypertension: Secondary | ICD-10-CM | POA: Diagnosis not present

## 2014-08-13 DIAGNOSIS — M19041 Primary osteoarthritis, right hand: Secondary | ICD-10-CM | POA: Insufficient documentation

## 2014-08-13 DIAGNOSIS — L539 Erythematous condition, unspecified: Secondary | ICD-10-CM | POA: Diagnosis not present

## 2014-08-13 DIAGNOSIS — F17200 Nicotine dependence, unspecified, uncomplicated: Secondary | ICD-10-CM | POA: Diagnosis not present

## 2014-08-13 DIAGNOSIS — M7989 Other specified soft tissue disorders: Secondary | ICD-10-CM | POA: Diagnosis not present

## 2014-08-13 DIAGNOSIS — M189 Osteoarthritis of first carpometacarpal joint, unspecified: Secondary | ICD-10-CM | POA: Insufficient documentation

## 2014-08-13 LAB — CBC WITH DIFFERENTIAL/PLATELET
Basophils Absolute: 0.1 10*3/uL (ref 0.0–0.1)
Basophils Relative: 1 % (ref 0–1)
Eosinophils Absolute: 0.3 10*3/uL (ref 0.0–0.7)
Eosinophils Relative: 4 % (ref 0–5)
HCT: 41.1 % (ref 39.0–52.0)
Hemoglobin: 13.9 g/dL (ref 13.0–17.0)
Lymphocytes Relative: 33 % (ref 12–46)
Lymphs Abs: 2.2 10*3/uL (ref 0.7–4.0)
MCH: 31.2 pg (ref 26.0–34.0)
MCHC: 33.8 g/dL (ref 30.0–36.0)
MCV: 92.4 fL (ref 78.0–100.0)
MPV: 10.6 fL (ref 8.6–12.4)
Monocytes Absolute: 0.7 10*3/uL (ref 0.1–1.0)
Monocytes Relative: 10 % (ref 3–12)
Neutro Abs: 3.4 10*3/uL (ref 1.7–7.7)
Neutrophils Relative %: 52 % (ref 43–77)
Platelets: 267 10*3/uL (ref 150–400)
RBC: 4.45 MIL/uL (ref 4.22–5.81)
RDW: 14.6 % (ref 11.5–15.5)
WBC: 6.6 10*3/uL (ref 4.0–10.5)

## 2014-08-13 LAB — POCT GLYCOSYLATED HEMOGLOBIN (HGB A1C): Hemoglobin A1C: 5

## 2014-08-13 MED ORDER — TRAMADOL HCL 50 MG PO TABS: 50.0000 mg | ORAL_TABLET | Freq: Three times a day (TID) | ORAL | Status: DC | PRN

## 2014-08-13 MED ORDER — PREDNISONE 20 MG PO TABS: 50.0000 mg | ORAL_TABLET | Freq: Every day | ORAL | Status: DC

## 2014-08-13 NOTE — Assessment & Plan Note (Signed)
A: well appearing male. No fever. No evidence of fracture or soft tissue infection on exam. The pain in your hand is due to gout flare or osteoarthritis.  P: Prednisone 50 mg daily with food for next 5-7 days Tramadol as needed for pain, do not drive or operate heavy machinery while taking tramadol  Rest R hand and wrist as much as possible Ice for 20 minutes twice daily.  Compression and support with wrist splint-this is over the counter.   You will be called with lab results: sed rate, uric acid, CBC, CMP Please for for x-ray of R wrist and thumb at your earliest convenience. F/u in 7-10 days with Chari Manning for R wrist pain

## 2014-08-13 NOTE — Progress Notes (Signed)
   Subjective:    Patient ID: [name], male    DOB: [birth year], 59 y.o.   MRN: [id number] CC: R thumb and R lateral wrist pain, hx of gout  HPI 59 yo M with hx of DM2, HTN, gout presents for same day visit:  1. R thumb and wrist pain: x 2-3 months. Has hx of gout in the past. Also know OA in R thumb based on x-ray from 9/15. He denies injury. He denies laceration. He denies fever and severe redness. Pain has been worsening.  Pain at base of R thumb and R radial wrist. Also had R ulnar wrist pain a few weeks ago but this resolved. He takes allopurinol daily. No relief with tylenol. Had mobic and tramadol on med list but did not have any to take. 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 He prefers not to miss work.   Soc Hx: current smoker  Review of Systems As per HPI     Objective:   Physical Exam BP 131/87 mmHg  Pulse 73  Temp(Src) 97.9 F (36.6 C)  Resp 16  Ht 6\' 2"  (1.88 m)  Wt 226 lb (102.513 kg)  BMI 29.00 kg/m2  SpO2 98% General appearance: alert, cooperative and no distress Extremities: R hand and R wrist: edema with mild erythema along based on R thumb, down to thenar compartment and distal radius. There is moderate tenderness to palpation. Normal ROM. No fluctuance. No deformity or lacerations. There is a ganglion cyst on anterior radial side. No pain along ulnar wrist or central compartment or hypothenar compartment.   Labs from today: Lab Results  Component Value Date   HGBA1C 5.0 08/13/2014   Labs reviewed: Uric acid 5.2 05/2011 Cr 0.8 02/2013 WBC 6.7 02/2013  Assessment & Plan:

## 2014-08-13 NOTE — Progress Notes (Signed)
Patient complains of right thumb pain over last several months Patient has had flu and PNA shot Patient complains of burning sensation in sinuses, itchy red eyes

## 2014-08-13 NOTE — Telephone Encounter (Signed)
Pt aware of Xray results

## 2014-08-13 NOTE — Telephone Encounter (Signed)
-----   Message from Minerva Ends, MD sent at 08/13/2014  1:35 PM EST ----- Reviewed x-ray imaged severe arthritis in 1st MCP joint. Most typical of osteoarthritis.  Recommend referral to hand surgery if pain not well controlled by conservative measure-prednisone, NSAIDs, tylenol. Wrist splint. Will defer referral to PCP if warranted. I recommend Dr. Amedeo Plenty. Patient will need some insurance coverage if he does not already have some.  No evidence of infection.   Isabellamarie Randa please call patient with x-ray results Ria Bush.  If he responds well to short course of prednisone and uric acid still very low,  I recommend tylenol 650 TID with NSAID prn breakthrough. Considering adding calcium and vit D.

## 2014-08-13 NOTE — Patient Instructions (Signed)
[name],  Thank you for coming in today. The pain in your hand is due to gout or osteoarthritis, I see no signs of infection  Treatment plan: Prednisone 50 mg daily with food for next 5-7 days Tramadol as needed for pain, do not drive or operate heavy machinery while taking tramadol  Rest R hand and wrist as much as possible Ice for 20 minutes twice daily.  Compression and support with wrist splint-this is over the counter.   You will be called with lab results Please for for x-ray of R wrist and thumb at your earliest convenience.  F/u in 7-10 days with Chari Manning for R wrist pain  Dr. Adrian Blackwater

## 2014-08-14 LAB — COMPLETE METABOLIC PANEL WITH GFR
ALT: 28 U/L (ref 0–53)
AST: 31 U/L (ref 0–37)
Albumin: 4.3 g/dL (ref 3.5–5.2)
Alkaline Phosphatase: 110 U/L (ref 39–117)
BUN: 18 mg/dL (ref 6–23)
CO2: 25 mEq/L (ref 19–32)
Calcium: 9.8 mg/dL (ref 8.4–10.5)
Chloride: 105 mEq/L (ref 96–112)
Creat: 1.06 mg/dL (ref 0.50–1.35)
GFR, Est African American: 88 mL/min
GFR, Est Non African American: 76 mL/min
Glucose, Bld: 82 mg/dL (ref 70–99)
Potassium: 4.5 mEq/L (ref 3.5–5.3)
Sodium: 139 mEq/L (ref 135–145)
Total Bilirubin: 0.4 mg/dL (ref 0.2–1.2)
Total Protein: 8 g/dL (ref 6.0–8.3)

## 2014-08-14 LAB — SEDIMENTATION RATE: Sed Rate: 26 mm/hr — ABNORMAL HIGH (ref 0–20)

## 2014-08-14 LAB — URIC ACID: Uric Acid, Serum: 4.1 mg/dL (ref 4.0–7.8)

## 2014-08-15 ENCOUNTER — Telehealth: Payer: Self-pay | Admitting: *Deleted

## 2014-08-15 ENCOUNTER — Telehealth: Payer: Self-pay | Admitting: Emergency Medicine

## 2014-08-15 NOTE — Telephone Encounter (Signed)
Patient rescheduled health coach apt 2/18 1300

## 2014-08-15 NOTE — Telephone Encounter (Signed)
Pt aware of lab results 

## 2014-08-15 NOTE — Telephone Encounter (Signed)
-----   Message from Minerva Ends, MD sent at 08/14/2014  2:18 PM EST ----- Normal uric acid. Slight, non significant elevation of sed rate for patient's age, consistent with OA rather than gout flare.  Normal CBC and CMP  Junious Dresser, please call results Ria Bush

## 2014-08-20 ENCOUNTER — Encounter: Payer: Self-pay | Admitting: Internal Medicine

## 2014-08-20 ENCOUNTER — Ambulatory Visit: Payer: Federal, State, Local not specified - PPO | Attending: Internal Medicine | Admitting: Internal Medicine

## 2014-08-20 VITALS — BP 111/74 | HR 75 | Temp 98.3°F | Resp 16 | Ht 73.0 in | Wt 230.0 lb

## 2014-08-20 DIAGNOSIS — K219 Gastro-esophageal reflux disease without esophagitis: Secondary | ICD-10-CM | POA: Diagnosis not present

## 2014-08-20 DIAGNOSIS — M109 Gout, unspecified: Secondary | ICD-10-CM

## 2014-08-20 DIAGNOSIS — Z79899 Other long term (current) drug therapy: Secondary | ICD-10-CM | POA: Diagnosis not present

## 2014-08-20 DIAGNOSIS — F17210 Nicotine dependence, cigarettes, uncomplicated: Secondary | ICD-10-CM | POA: Diagnosis not present

## 2014-08-20 DIAGNOSIS — Z794 Long term (current) use of insulin: Secondary | ICD-10-CM | POA: Diagnosis not present

## 2014-08-20 DIAGNOSIS — I10 Essential (primary) hypertension: Secondary | ICD-10-CM

## 2014-08-20 DIAGNOSIS — E785 Hyperlipidemia, unspecified: Secondary | ICD-10-CM | POA: Diagnosis not present

## 2014-08-20 DIAGNOSIS — E119 Type 2 diabetes mellitus without complications: Secondary | ICD-10-CM

## 2014-08-20 DIAGNOSIS — M10079 Idiopathic gout, unspecified ankle and foot: Secondary | ICD-10-CM

## 2014-08-20 LAB — GLUCOSE, POCT (MANUAL RESULT ENTRY): POC Glucose: 165 mg/dl — AB (ref 70–99)

## 2014-08-20 MED ORDER — AMLODIPINE BESYLATE 10 MG PO TABS: 10.0000 mg | ORAL_TABLET | Freq: Every day | ORAL | Status: DC

## 2014-08-20 MED ORDER — ALLOPURINOL 300 MG PO TABS: 300.0000 mg | ORAL_TABLET | Freq: Every day | ORAL | Status: DC

## 2014-08-20 MED ORDER — LOSARTAN POTASSIUM 50 MG PO TABS: 50.0000 mg | ORAL_TABLET | Freq: Every day | ORAL | Status: DC

## 2014-08-20 MED ORDER — METFORMIN HCL 1000 MG PO TABS: 1000.0000 mg | ORAL_TABLET | Freq: Two times a day (BID) | ORAL | Status: DC

## 2014-08-20 NOTE — Progress Notes (Signed)
Patient ID: [name], male   DOB: May 25, 1956, 59 y.o.   MRN: [id number]  CC: Dm follow up  HPI: [name] is a 59 y.o. male here today for a follow up visit.  Patient has past medical history of T2DM, hypertension, HLD, and OA.  He currently checks his blood sugar twice per day and reports that he has only used his Lantus once weekly.  He reports that the Metformin has been managing his numbers very well. He has changed his eating habits dramatically.  He has only used one vial of insulin since prescribed due to the great control he has had. No nausea, polyuria, polydipsia, neuropathy, blurred vision.  Had umbilical hernia removed 4 months ago. He was found to have 5 cyst under his hernia. He was given Chantix 1 mg BID. He continues to use  nicotine lozenges in conjunction with Chantix to help his cravings.   Checks feet daily, skin intact. Dry scaly skin, has been using cream on feet.   Allergies  Allergen Reactions  . Ace Inhibitors     Angioedema   . Aceite Annita Brod Oil]    Past Medical History  Diagnosis Date  . Acid reflux   . Gout   . Hypertension   . Diabetes mellitus   . Hyperlipidemia   . Incarcerated ventral hernia   . Mouth bleeding     upper teeth right back side loose and bleed at night  . Arthritis    Current Outpatient Prescriptions on File Prior to Visit  Medication Sig Dispense Refill  . albuterol (PROVENTIL HFA;VENTOLIN HFA) 108 (90 BASE) MCG/ACT inhaler Inhale 2 puffs into the lungs every 6 (six) hours as needed for wheezing or shortness of breath. 1 Inhaler 4  . allopurinol (ZYLOPRIM) 300 MG tablet Take 1 tablet (300 mg total) by mouth daily. 30 tablet 4  . amLODipine (NORVASC) 10 MG tablet Take 1 tablet (10 mg total) by mouth daily. 30 tablet 4  . docusate sodium (COLACE) 100 MG capsule Take 1 capsule (100 mg total) by mouth 2 (two) times daily. 100 capsule 3  . famotidine (PEPCID) 20 MG tablet Take 1 tablet (20 mg total) by mouth 2 (two)  times daily. 14 tablet 0  . glucose blood test strip Use as instructed 100 each 12  . glucose monitoring kit (FREESTYLE) monitoring kit 1 each by Does not apply route as needed for other. 1 each 0  . Insulin Glargine (LANTUS SOLOSTAR) 100 UNIT/ML Solostar Pen Inject 12 Units into the skin daily at 10 pm. 5 pen 6  . Insulin Pen Needle (PEN NEEDLES) 31G X 6 MM MISC Use lantus every night at hours of sleep 50 each 3  . Lancets (FREESTYLE) lancets Use as instructed 100 each 12  . lansoprazole (PREVACID) 15 MG capsule Take 15 mg by mouth daily at 12 noon.    [name] losartan (COZAAR) 50 MG tablet Take 1 tablet (50 mg total) by mouth daily. 30 tablet 4  . metFORMIN (GLUCOPHAGE) 1000 MG tablet Take 1 tablet (1,000 mg total) by mouth 2 (two) times daily with a meal. 60 tablet 4  . omeprazole (PRILOSEC) 20 MG capsule Take 20 mg by mouth daily.    . traMADol (ULTRAM) 50 MG tablet Take 1 tablet (50 mg total) by mouth every 8 (eight) hours as needed. 30 tablet 0  . predniSONE (DELTASONE) 20 MG tablet Take 2.5 tablets (50 mg total) by mouth daily with breakfast. (Patient not taking: Reported on 08/20/2014)  18 tablet 0   Current Facility-Administered Medications on File Prior to Visit  Medication Dose Route Frequency Provider Last Rate Last Dose  . 0.9 %  sodium chloride infusion   Intravenous Continuous Lance Bosch, NP       Family History  Problem Relation Age of Onset  . Diabetes Mother   . Cancer Father   . Multiple sclerosis Sister   . Heart failure Brother    History   Social History  . Marital Status: Single    Spouse Name: N/A  . Number of Children: N/A  . Years of Education: N/A   Occupational History  . Not on file.   Social History Main Topics  . Smoking status: Current Every Day Smoker -- 0.50 packs/day    Types: Cigarettes  . Smokeless tobacco: Never Used  . Alcohol Use: 0.0 oz/week     Comment: weekends only  . Drug Use: No  . Sexual Activity: No   Other Topics Concern  . Not  on file   Social History Narrative    Review of Systems: See history of present illness   Objective:   Filed Vitals:   08/20/14 1459  BP: 111/74  Pulse: 75  Temp: 98.3 F (36.8 C)  Resp: 16    Physical Exam  Constitutional: He is oriented to person, place, and time.  Cardiovascular: Normal rate, regular rhythm and normal heart sounds.   Pulmonary/Chest: Effort normal and breath sounds normal.  Abdominal: Soft. Bowel sounds are normal.  Feet:  Right Foot:  Skin Integrity: Positive for dry skin. Negative for skin breakdown.  Left Foot:  Skin Integrity: Positive for dry skin. Negative for skin breakdown.  Neurological: He is alert and oriented to person, place, and time.  Skin: Skin is warm and dry.  Psychiatric: He has a normal mood and affect.     Lab Results  Component Value Date   WBC 6.6 08/13/2014   HGB 13.9 08/13/2014   HCT 41.1 08/13/2014   MCV 92.4 08/13/2014   PLT 267 08/13/2014   Lab Results  Component Value Date   CREATININE 1.06 08/13/2014   BUN 18 08/13/2014   NA 139 08/13/2014   K 4.5 08/13/2014   CL 105 08/13/2014   CO2 25 08/13/2014    Lab Results  Component Value Date   HGBA1C 5.0 08/13/2014   Lipid Panel     Component Value Date/Time   CHOL 167 12/29/2012 1829   TRIG 201* 12/29/2012 1829   HDL 36* 12/29/2012 1829   CHOLHDL 4.6 12/29/2012 1829   VLDL 40 12/29/2012 1829   LDLCALC 91 12/29/2012 1829       Assessment and plan:   [name] was seen today for follow-up.  Diagnoses and all orders for this visit:  Type 2 diabetes mellitus without complication Orders: -     Glucose (CBG) -     Refill metFORMIN (GLUCOPHAGE) 1000 MG tablet; Take 1 tablet (1,000 mg total) by mouth 2 (two) times daily with a meal. -     Microalbumin, urine Patients diabetes is well control as evidence by a1c of 5.0.  Patient will continue with current therapy and continue to make necessary lifestyle changes.  Reviewed foot care, diet, exercise, annual  health maintenance with patient.   Essential hypertension Orders: -     Refill losartan (COZAAR) 50 MG tablet; Take 1 tablet (50 mg total) by mouth daily. -     Refill amLODipine (NORVASC) 10 MG tablet; Take 1 tablet (  10 mg total) by mouth daily. Patient blood pressure is stable and may continue on current medication.  Education on diet, exercise, and modifiable risk factors discussed. Will obtain appropriate labs as needed. Will follow up in 3-6 months.   HLD (hyperlipidemia) Orders: -     Lipid panel; Future  Gout of foot, unspecified cause, unspecified chronicity, unspecified laterality Orders: -     Refill allopurinol (ZYLOPRIM) 300 MG tablet; Take 1 tablet (300 mg total) by mouth daily.   Return in about 3 months (around 11/18/2014) for DM/HTN and 1 week lab.       Chari Manning, NP-C [facility] [phone] 08/20/2014, 3:20 PM

## 2014-08-20 NOTE — Patient Instructions (Signed)
Smoking Cessation Quitting smoking is important to your health and has many advantages. However, it is not always easy to quit since nicotine is a very addictive drug. Oftentimes, people try 3 times or more before being able to quit. This document explains the best ways for you to prepare to quit smoking. Quitting takes hard work and a lot of effort, but you can do it. ADVANTAGES OF QUITTING SMOKING  You will live longer, feel better, and live better.  Your body will feel the impact of quitting smoking almost immediately.  Within 20 minutes, blood pressure decreases. Your pulse returns to its normal level.  After 8 hours, carbon monoxide levels in the blood return to normal. Your oxygen level increases.  After 24 hours, the chance of having a heart attack starts to decrease. Your breath, hair, and body stop smelling like smoke.  After 48 hours, damaged nerve endings begin to recover. Your sense of taste and smell improve.  After 72 hours, the body is virtually free of nicotine. Your bronchial tubes relax and breathing becomes easier.  After 2 to 12 weeks, lungs can hold more air. Exercise becomes easier and circulation improves.  The risk of having a heart attack, stroke, cancer, or lung disease is greatly reduced.  After 1 year, the risk of coronary heart disease is cut in half.  After 5 years, the risk of stroke falls to the same as a nonsmoker.  After 10 years, the risk of lung cancer is cut in half and the risk of other cancers decreases significantly.  After 15 years, the risk of coronary heart disease drops, usually to the level of a nonsmoker.  If you are pregnant, quitting smoking will improve your chances of having a healthy baby.  The people you live with, especially any children, will be healthier.  You will have extra money to spend on things other than cigarettes. QUESTIONS TO THINK ABOUT BEFORE ATTEMPTING TO QUIT You may want to talk about your answers with your  health care provider.  Why do you want to quit?  If you tried to quit in the past, what helped and what did not?  What will be the most difficult situations for you after you quit? How will you plan to handle them?  Who can help you through the tough times? Your family? Friends? A health care provider?  What pleasures do you get from smoking? What ways can you still get pleasure if you quit? Here are some questions to ask your health care provider:  How can you help me to be successful at quitting?  What medicine do you think would be best for me and how should I take it?  What should I do if I need more help?  What is smoking withdrawal like? How can I get information on withdrawal? GET READY  Set a quit date.  Change your environment by getting rid of all cigarettes, ashtrays, matches, and lighters in your home, car, or work. Do not let people smoke in your home.  Review your past attempts to quit. Think about what worked and what did not. GET SUPPORT AND ENCOURAGEMENT You have a better chance of being successful if you have help. You can get support in many ways.  Tell your family, friends, and coworkers that you are going to quit and need their support. Ask them not to smoke around you.  Get individual, group, or telephone counseling and support. Programs are available at local hospitals and health centers. Call   your local health department for information about programs in your area.  Spiritual beliefs and practices may help some smokers quit.  Download a "quit meter" on your computer to keep track of quit statistics, such as how long you have gone without smoking, cigarettes not smoked, and money saved.  Get a self-help book about quitting smoking and staying off tobacco. LEARN NEW SKILLS AND BEHAVIORS  Distract yourself from urges to smoke. Talk to someone, go for a walk, or occupy your time with a task.  Change your normal routine. Take a different route to work.  Drink tea instead of coffee. Eat breakfast in a different place.  Reduce your stress. Take a hot bath, exercise, or read a book.  Plan something enjoyable to do every day. Reward yourself for not smoking.  Explore interactive web-based programs that specialize in helping you quit. GET MEDICINE AND USE IT CORRECTLY Medicines can help you stop smoking and decrease the urge to smoke. Combining medicine with the above behavioral methods and support can greatly increase your chances of successfully quitting smoking.  Nicotine replacement therapy helps deliver nicotine to your body without the negative effects and risks of smoking. Nicotine replacement therapy includes nicotine gum, lozenges, inhalers, nasal sprays, and skin patches. Some may be available over-the-counter and others require a prescription.  Antidepressant medicine helps people abstain from smoking, but how this works is unknown. This medicine is available by prescription.  Nicotinic receptor partial agonist medicine simulates the effect of nicotine in your brain. This medicine is available by prescription. Ask your health care provider for advice about which medicines to use and how to use them based on your health history. Your health care provider will tell you what side effects to look out for if you choose to be on a medicine or therapy. Carefully read the information on the package. Do not use any other product containing nicotine while using a nicotine replacement product.  RELAPSE OR DIFFICULT SITUATIONS Most relapses occur within the first 3 months after quitting. Do not be discouraged if you start smoking again. Remember, most people try several times before finally quitting. You may have symptoms of withdrawal because your body is used to nicotine. You may crave cigarettes, be irritable, feel very hungry, cough often, get headaches, or have difficulty concentrating. The withdrawal symptoms are only temporary. They are strongest  when you first quit, but they will go away within 10-14 days. To reduce the chances of relapse, try to:  Avoid drinking alcohol. Drinking lowers your chances of successfully quitting.  Reduce the amount of caffeine you consume. Once you quit smoking, the amount of caffeine in your body increases and can give you symptoms, such as a rapid heartbeat, sweating, and anxiety.  Avoid smokers because they can make you want to smoke.  Do not let weight gain distract you. Many smokers will gain weight when they quit, usually less than 10 pounds. Eat a healthy diet and stay active. You can always lose the weight gained after you quit.  Find ways to improve your mood other than smoking. FOR MORE INFORMATION  www.smokefree.gov  Document Released: 06/15/2001 Document Revised: 11/05/2013 Document Reviewed: 09/30/2011 ExitCare Patient Information 2015 [employer]. This information is not intended to replace advice given to you by your health care provider. Make sure you discuss any questions you have with your health care provider. DASH Eating Plan DASH stands for "Dietary Approaches to Stop Hypertension." The DASH eating plan is a healthy eating plan that has   been shown to reduce high blood pressure (hypertension). Additional health benefits may include reducing the risk of type 2 diabetes mellitus, heart disease, and stroke. The DASH eating plan may also help with weight loss. WHAT DO I NEED TO KNOW ABOUT THE DASH EATING PLAN? For the DASH eating plan, you will follow these general guidelines:  Choose foods with a percent daily value for sodium of less than 5% (as listed on the food label).  Use salt-free seasonings or herbs instead of table salt or sea salt.  Check with your health care provider or pharmacist before using salt substitutes.  Eat lower-sodium products, often labeled as "lower sodium" or "no salt added."  Eat fresh foods.  Eat more vegetables, fruits, and low-fat dairy  products.  Choose whole grains. Look for the word "whole" as the first word in the ingredient list.  Choose fish and skinless chicken or turkey more often than red meat. Limit fish, poultry, and meat to 6 oz (170 g) each day.  Limit sweets, desserts, sugars, and sugary drinks.  Choose heart-healthy fats.  Limit cheese to 1 oz (28 g) per day.  Eat more home-cooked food and less restaurant, buffet, and fast food.  Limit fried foods.  Cook foods using methods other than frying.  Limit canned vegetables. If you do use them, rinse them well to decrease the sodium.  When eating at a restaurant, ask that your food be prepared with less salt, or no salt if possible. WHAT FOODS CAN I EAT? Seek help from a dietitian for individual calorie needs. Grains Whole grain or whole wheat bread. Brown rice. Whole grain or whole wheat pasta. Quinoa, bulgur, and whole grain cereals. Low-sodium cereals. Corn or whole wheat flour tortillas. Whole grain cornbread. Whole grain crackers. Low-sodium crackers. Vegetables Fresh or frozen vegetables (raw, steamed, roasted, or grilled). Low-sodium or reduced-sodium tomato and vegetable juices. Low-sodium or reduced-sodium tomato sauce and paste. Low-sodium or reduced-sodium canned vegetables.  Fruits All fresh, canned (in natural juice), or frozen fruits. Meat and Other Protein Products Ground beef (85% or leaner), grass-fed beef, or beef trimmed of fat. Skinless chicken or turkey. Ground chicken or turkey. Pork trimmed of fat. All fish and seafood. Eggs. Dried beans, peas, or lentils. Unsalted nuts and seeds. Unsalted canned beans. Dairy Low-fat dairy products, such as skim or 1% milk, 2% or reduced-fat cheeses, low-fat ricotta or cottage cheese, or plain low-fat yogurt. Low-sodium or reduced-sodium cheeses. Fats and Oils Tub margarines without trans fats. Light or reduced-fat mayonnaise and salad dressings (reduced sodium). Avocado. Safflower, olive, or canola  oils. Natural peanut or almond butter. Other Unsalted popcorn and pretzels. The items listed above may not be a complete list of recommended foods or beverages. Contact your dietitian for more options. WHAT FOODS ARE NOT RECOMMENDED? Grains White bread. White pasta. White rice. Refined cornbread. Bagels and croissants. Crackers that contain trans fat. Vegetables Creamed or fried vegetables. Vegetables in a cheese sauce. Regular canned vegetables. Regular canned tomato sauce and paste. Regular tomato and vegetable juices. Fruits Dried fruits. Canned fruit in light or heavy syrup. Fruit juice. Meat and Other Protein Products Fatty cuts of meat. Ribs, chicken wings, bacon, sausage, bologna, salami, chitterlings, fatback, hot dogs, bratwurst, and packaged luncheon meats. Salted nuts and seeds. Canned beans with salt. Dairy Whole or 2% milk, cream, half-and-half, and cream cheese. Whole-fat or sweetened yogurt. Full-fat cheeses or blue cheese. Nondairy creamers and whipped toppings. Processed cheese, cheese spreads, or cheese curds. Condiments Onion and garlic salt,   seasoned salt, table salt, and sea salt. Canned and packaged gravies. Worcestershire sauce. Tartar sauce. Barbecue sauce. Teriyaki sauce. Soy sauce, including reduced sodium. Steak sauce. Fish sauce. Oyster sauce. Cocktail sauce. Horseradish. Ketchup and mustard. Meat flavorings and tenderizers. Bouillon cubes. Hot sauce. Tabasco sauce. Marinades. Taco seasonings. Relishes. Fats and Oils Butter, stick margarine, lard, shortening, ghee, and bacon fat. Coconut, palm kernel, or palm oils. Regular salad dressings. Other Pickles and olives. Salted popcorn and pretzels. The items listed above may not be a complete list of foods and beverages to avoid. Contact your dietitian for more information. WHERE CAN I FIND MORE INFORMATION? National Heart, Lung, and [employer] Institute: www.nhlbi.nih.gov/health/health-topics/topics/dash/ Document Released:  06/10/2011 Document Revised: 11/05/2013 Document Reviewed: 04/25/2013 ExitCare Patient Information 2015 [employer]. This information is not intended to replace advice given to you by your health care provider. Make sure you discuss any questions you have with your health care provider.  

## 2014-08-20 NOTE — Progress Notes (Signed)
Pt is here following up on his HTN, diabetes, hyperlipidemia and his arthritis. Pt is still having pain in his right thumb with a loss of ROM.

## 2014-08-21 LAB — MICROALBUMIN, URINE: Microalb, Ur: 8.2 mg/dL — ABNORMAL HIGH (ref <2.0)

## 2014-09-04 ENCOUNTER — Telehealth: Payer: Self-pay | Admitting: Internal Medicine

## 2014-09-04 ENCOUNTER — Ambulatory Visit: Payer: Federal, State, Local not specified - PPO | Attending: Internal Medicine

## 2014-09-04 DIAGNOSIS — E785 Hyperlipidemia, unspecified: Secondary | ICD-10-CM

## 2014-09-04 NOTE — Telephone Encounter (Signed)
Patient has come in today for lab work and wanted to request a referral for Opthalmology; please f/u with patient about this request; his last OV was 2/16 with PCP

## 2014-09-05 LAB — LIPID PANEL
Cholesterol: 173 mg/dL (ref 0–200)
HDL: 56 mg/dL (ref >=40)
LDL Cholesterol: 94 mg/dL (ref 0–99)
Total CHOL/HDL Ratio: 3.1 Ratio
Triglycerides: 114 mg/dL (ref <150)
VLDL: 23 mg/dL (ref 0–40)

## 2014-09-05 NOTE — Telephone Encounter (Signed)
Referral was placed in September and this was the note on his referral Pt has [place] sent by mail eye doctor resources for the patient to contact a doctor close to the area   He will need to refer to that list or call the insurance company to find out who takes his insurance.  If not you can give him the numbers on our eye list and see if he can get in with one of those doctors

## 2014-09-19 ENCOUNTER — Telehealth: Payer: Self-pay | Admitting: *Deleted

## 2014-09-19 MED ORDER — ATORVASTATIN CALCIUM 20 MG PO TABS: 20.0000 mg | ORAL_TABLET | Freq: Every day | ORAL | Status: DC

## 2014-09-19 NOTE — Telephone Encounter (Signed)
-----   Message from Lance Bosch, NP sent at 09/08/2014  5:46 PM EST ----- Cholesterol looks good, but it will still be beneficial for patient to begin on statin therapy since he is diabetic and a smoker. Please send atorvastatin 20 mg PO every evening. Explain to him that this will help lower the risk for stroke and heart attack.

## 2014-09-19 NOTE — Telephone Encounter (Signed)
Pt is aware of his lab results. I sent new medication to his pharmacy.

## 2014-10-04 LAB — HM COLONOSCOPY: HM Colonoscopy: NORMAL

## 2014-11-18 ENCOUNTER — Ambulatory Visit (HOSPITAL_BASED_OUTPATIENT_CLINIC_OR_DEPARTMENT_OTHER): Payer: Federal, State, Local not specified - PPO | Admitting: Internal Medicine

## 2014-11-18 ENCOUNTER — Encounter: Payer: Self-pay | Admitting: Internal Medicine

## 2014-11-18 ENCOUNTER — Ambulatory Visit (HOSPITAL_COMMUNITY)
Admission: RE | Admit: 2014-11-18 | Discharge: 2014-11-18 | Disposition: A | Discharge status: Home / Self Care | Payer: Federal, State, Local not specified - PPO | Source: Ambulatory Visit | Attending: Cardiology | Admitting: Cardiology

## 2014-11-18 ENCOUNTER — Other Ambulatory Visit: Payer: Self-pay

## 2014-11-18 VITALS — BP 113/72 | HR 72 | Temp 97.9°F | Ht 74.0 in | Wt 229.0 lb

## 2014-11-18 DIAGNOSIS — K30 Functional dyspepsia: Secondary | ICD-10-CM

## 2014-11-18 DIAGNOSIS — R079 Chest pain, unspecified: Secondary | ICD-10-CM | POA: Insufficient documentation

## 2014-11-18 DIAGNOSIS — R040 Epistaxis: Secondary | ICD-10-CM | POA: Diagnosis not present

## 2014-11-18 DIAGNOSIS — R1013 Epigastric pain: Secondary | ICD-10-CM | POA: Diagnosis not present

## 2014-11-18 DIAGNOSIS — E1165 Type 2 diabetes mellitus with hyperglycemia: Secondary | ICD-10-CM

## 2014-11-18 DIAGNOSIS — R0789 Other chest pain: Secondary | ICD-10-CM | POA: Diagnosis not present

## 2014-11-18 LAB — POCT GLYCOSYLATED HEMOGLOBIN (HGB A1C): Hemoglobin A1C: 5.3

## 2014-11-18 LAB — GLUCOSE, POCT (MANUAL RESULT ENTRY): POC Glucose: 110 mg/dL — AB (ref 70–99)

## 2014-11-18 MED ORDER — OMEPRAZOLE 20 MG PO CPDR: 20.0000 mg | DELAYED_RELEASE_CAPSULE | Freq: Every day | ORAL | Status: DC

## 2014-11-18 MED ORDER — GI COCKTAIL ~~LOC~~
30.0000 mL | Freq: Once | ORAL | Status: AC
  Administered 2014-11-18: 30 mL via ORAL

## 2014-11-18 NOTE — Progress Notes (Signed)
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

## 2014-11-18 NOTE — Patient Instructions (Signed)

## 2014-11-18 NOTE — Progress Notes (Signed)
Patient ID: [name], male   DOB: May 17, 1956, 59 y.o.   MRN: [id number]  CC: chest burning   HPI: [name] is a 59 y.o. male here today for a follow up visit.  Patient has past medical history of GERD, gout, HTN, DM, and HLD.  Patient presents with a chest discomfort and believes that it is related to his acid reflux. He had a hernia surgery in November and is now having a burning pain to the right of the incision. He denies any redness, warmth, or drainage from the incision site.  He reports that for 1 week he has been feeling like something is stuck in his chest. Has been using OTC pepcid for acid reflux.  He complains of nosebleeds 2-3 times per week.   Allergies  Allergen Reactions  . Ace Inhibitors     Angioedema   . Aceite Annita Brod Oil]    Past Medical History  Diagnosis Date  . Acid reflux   . Gout   . Hypertension   . Diabetes mellitus   . Hyperlipidemia   . Incarcerated ventral hernia   . Mouth bleeding     upper teeth right back side loose and bleed at night  . Arthritis    Current Outpatient Prescriptions on File Prior to Visit  Medication Sig Dispense Refill  . allopurinol (ZYLOPRIM) 300 MG tablet Take 1 tablet (300 mg total) by mouth daily. 30 tablet 4  . amLODipine (NORVASC) 10 MG tablet Take 1 tablet (10 mg total) by mouth daily. 30 tablet 4  . atorvastatin (LIPITOR) 20 MG tablet Take 1 tablet (20 mg total) by mouth daily. 90 tablet 3  . docusate sodium (COLACE) 100 MG capsule Take 1 capsule (100 mg total) by mouth 2 (two) times daily. 100 capsule 3  . glucose blood test strip Use as instructed 100 each 12  . glucose monitoring kit (FREESTYLE) monitoring kit 1 each by Does not apply route as needed for other. 1 each 0  . Insulin Pen Needle (PEN NEEDLES) 31G X 6 MM MISC Use lantus every night at hours of sleep 50 each 3  . Lancets (FREESTYLE) lancets Use as instructed 100 each 12  . losartan (COZAAR) 50 MG tablet Take 1 tablet (50 mg total) by  mouth daily. 30 tablet 4  . metFORMIN (GLUCOPHAGE) 1000 MG tablet Take 1 tablet (1,000 mg total) by mouth 2 (two) times daily with a meal. 60 tablet 4  . omeprazole (PRILOSEC) 20 MG capsule Take 20 mg by mouth daily.    [name] albuterol (PROVENTIL HFA;VENTOLIN HFA) 108 (90 BASE) MCG/ACT inhaler Inhale 2 puffs into the lungs every 6 (six) hours as needed for wheezing or shortness of breath. (Patient not taking: Reported on 11/18/2014) 1 Inhaler 4  . Insulin Glargine (LANTUS SOLOSTAR) 100 UNIT/ML Solostar Pen Inject 12 Units into the skin daily at 10 pm. (Patient not taking: Reported on 11/18/2014) 5 pen 6  . traMADol (ULTRAM) 50 MG tablet Take 1 tablet (50 mg total) by mouth every 8 (eight) hours as needed. (Patient not taking: Reported on 11/18/2014) 30 tablet 0   Current Facility-Administered Medications on File Prior to Visit  Medication Dose Route Frequency Provider Last Rate Last Dose  . 0.9 %  sodium chloride infusion   Intravenous Continuous Lance Bosch, NP       Family History  Problem Relation Age of Onset  . Diabetes Mother   . Cancer Father   . Multiple sclerosis Sister   .  Heart failure Brother    History   Social History  . Marital Status: Single    Spouse Name: N/A  . Number of Children: N/A  . Years of Education: N/A   Occupational History  . Not on file.   Social History Main Topics  . Smoking status: Current Every Day Smoker -- 0.50 packs/day    Types: Cigarettes  . Smokeless tobacco: Never Used  . Alcohol Use: 0.0 oz/week     Comment: weekends only  . Drug Use: No  . Sexual Activity: No   Other Topics Concern  . Not on file   Social History Narrative    Review of Systems: See HPI   Objective:   Filed Vitals:   11/18/14 1117  BP: 113/72  Pulse: 72  Temp: 97.9 F (36.6 C)    Physical Exam  Cardiovascular: Normal rate, regular rhythm and normal heart sounds.   No murmur heard. Pulmonary/Chest: Effort normal and breath sounds normal.   Musculoskeletal: Normal range of motion.  Skin: Skin is warm and dry.     Lab Results  Component Value Date   WBC 6.6 08/13/2014   HGB 13.9 08/13/2014   HCT 41.1 08/13/2014   MCV 92.4 08/13/2014   PLT 267 08/13/2014   Lab Results  Component Value Date   CREATININE 1.06 08/13/2014   BUN 18 08/13/2014   NA 139 08/13/2014   K 4.5 08/13/2014   CL 105 08/13/2014   CO2 25 08/13/2014    Lab Results  Component Value Date   HGBA1C 5.30 11/18/2014   Lipid Panel     Component Value Date/Time   CHOL 173 09/04/2014 0918   TRIG 114 09/04/2014 0918   HDL 56 09/04/2014 0918   CHOLHDL 3.1 09/04/2014 0918   VLDL 23 09/04/2014 0918   LDLCALC 94 09/04/2014 0918       Assessment and plan:   Phares was seen today for epistaxis, surgical pain and gastrophageal reflux.  Diagnoses and all orders for this visit:  Chest discomfort EKG ordered EKG: normal EKG, normal sinus rhythm.  Acid indigestion Orders: -     gi cocktail (Maalox,Lidocaine,Donnatal); Take 30 mLs by mouth once. -    Begin omeprazole (PRILOSEC) 20 MG capsule; Take 1 capsule (20 mg total) by mouth daily. I believe patient's chest pain is a result of acid reflux. He reports improvement in chest discomfort after GI cocktail.   Epistaxis  Advised patient to use normal saline for dryness and see if he notices any improvement.  Type 2 diabetes mellitus with hyperglycemia Orders: -     Glucose (CBG) -     HgB A1c D/c'ed insulin. Will manage with Metformin   Return in about 6 months (around 05/21/2015) for DM/HTN.     Chari Manning, [facility] [phone] 11/18/2014, 11:47 AM

## 2015-02-12 ENCOUNTER — Other Ambulatory Visit: Payer: Self-pay | Admitting: Internal Medicine

## 2015-04-07 ENCOUNTER — Other Ambulatory Visit: Payer: Self-pay | Admitting: Internal Medicine

## 2015-04-10 ENCOUNTER — Other Ambulatory Visit: Payer: Self-pay

## 2015-04-10 MED ORDER — AMLODIPINE BESYLATE 10 MG PO TABS
10.0000 mg | ORAL_TABLET | Freq: Every day | ORAL | Status: DC
Start: 2015-04-10 — End: 2015-05-22

## 2015-04-10 NOTE — Telephone Encounter (Signed)
Patient needs refill on Losartan but teh chart states that he has angioedema to ACE inhibitors. Has he been taking Losartan or not? Did he have a reaction that caused facial swelling to lisinopril or any other medication. Any problems with Losartan?

## 2015-05-16 ENCOUNTER — Encounter (HOSPITAL_COMMUNITY): Payer: Self-pay | Admitting: Emergency Medicine

## 2015-05-16 ENCOUNTER — Emergency Department (HOSPITAL_COMMUNITY)
Admission: EM | Admit: 2015-05-16 | Discharge: 2015-05-17 | Disposition: A | Discharge status: Home / Self Care | Payer: BLUE CROSS/BLUE SHIELD | Attending: Emergency Medicine | Admitting: Emergency Medicine

## 2015-05-16 DIAGNOSIS — I10 Essential (primary) hypertension: Secondary | ICD-10-CM | POA: Diagnosis not present

## 2015-05-16 DIAGNOSIS — Z720 Tobacco use: Secondary | ICD-10-CM | POA: Insufficient documentation

## 2015-05-16 DIAGNOSIS — Z79899 Other long term (current) drug therapy: Secondary | ICD-10-CM | POA: Insufficient documentation

## 2015-05-16 DIAGNOSIS — E119 Type 2 diabetes mellitus without complications: Secondary | ICD-10-CM | POA: Insufficient documentation

## 2015-05-16 DIAGNOSIS — E785 Hyperlipidemia, unspecified: Secondary | ICD-10-CM | POA: Diagnosis not present

## 2015-05-16 DIAGNOSIS — M1990 Unspecified osteoarthritis, unspecified site: Secondary | ICD-10-CM | POA: Diagnosis not present

## 2015-05-16 DIAGNOSIS — Z794 Long term (current) use of insulin: Secondary | ICD-10-CM | POA: Diagnosis not present

## 2015-05-16 DIAGNOSIS — K219 Gastro-esophageal reflux disease without esophagitis: Secondary | ICD-10-CM | POA: Insufficient documentation

## 2015-05-16 DIAGNOSIS — G8929 Other chronic pain: Secondary | ICD-10-CM | POA: Diagnosis not present

## 2015-05-16 DIAGNOSIS — Z7984 Long term (current) use of oral hypoglycemic drugs: Secondary | ICD-10-CM | POA: Diagnosis not present

## 2015-05-16 DIAGNOSIS — M109 Gout, unspecified: Secondary | ICD-10-CM | POA: Diagnosis not present

## 2015-05-16 MED ORDER — HYDROCODONE-ACETAMINOPHEN 5-325 MG PO TABS: ORAL_TABLET | ORAL | Status: DC

## 2015-05-16 MED ORDER — INDOMETHACIN 25 MG PO CAPS: ORAL_CAPSULE | ORAL | Status: DC

## 2015-05-16 MED ORDER — HYDROCODONE-ACETAMINOPHEN 5-325 MG PO TABS
1.0000 | ORAL_TABLET | Freq: Once | ORAL | Status: AC
  Administered 2015-05-16: 1 via ORAL
  Filled 2015-05-16: qty 1

## 2015-05-16 NOTE — ED Notes (Signed)
Pt from home c/o right foot pain. Hx of gout. He reports that he ran out of his allopurinal about a month ago and is having a gout flare up.

## 2015-05-16 NOTE — Discharge Instructions (Signed)
°Emergency Department Resource Guide °1) Find a Doctor and Pay Out of Pocket °Although you won't have to find out who is covered by your insurance plan, it is a good idea to ask around and get recommendations. You will then need to call the office and see if the doctor you have chosen will accept you as a new patient and what types of options they offer for patients who are self-pay. Some doctors offer discounts or will set up payment plans for their patients who do not have insurance, but you will need to ask so you aren't surprised when you get to your appointment. ° °2) Contact Your Local Health Department °Not all health departments have doctors that can see patients for sick visits, but many do, so it is worth a call to see if yours does. If you don't know where your local health department is, you can check in your phone book. The CDC also has a tool to help you locate your state's health department, and many state websites also have listings of all of their local health departments. ° °3) Find a Walk-in Clinic °If your illness is not likely to be very severe or complicated, you may want to try a walk in clinic. These are popping up all over the country in pharmacies, drugstores, and shopping centers. They're usually staffed by nurse practitioners or physician assistants that have been trained to treat common illnesses and complaints. They're usually fairly quick and inexpensive. However, if you have serious medical issues or chronic medical problems, these are probably not your best option. ° °No Primary Care Doctor: °- Call [facility] at  832-8000 - they can help you locate a primary care doctor that  accepts your insurance, provides certain services, etc. °- Physician Referral Service- 1-800-533-3463 ° °Chronic Pain Problems: °Organization         Address  Phone   Notes  °[facility]  (336) 297-2271 Patients need to be referred by their primary care doctor.  ° °Medication  Assistance: °Organization         Address  Phone   Notes  °[place] [facility] [address] °[place], [place] [address] (336) 641-8030 --Must be a resident of [place] °-- Must have NO insurance coverage whatsoever (no Medicaid/ Medicare, etc.) °-- The pt. MUST have a primary care doctor that directs their care regularly and follows them in the community °  °[facility]  (866) 331-1348   °[employer]  (888) 892-1162   ° °Agencies that provide inexpensive medical care: °Organization         Address  Phone   Notes  °[facility]  (336) 832-8035   °[facility]    (336) 832-7272   °[facility] [address] °[place], [place] [address] (336) 832-4777   °[facility] [address], °[place] (336) 271-4999   °[facility]    (336) 373-0678   °[facility]    (336) 272-1050   °[facility] ° [address], [place] Phone:  (336) 832-4444, Fax:  (336) 832-4440 Hours of Operation:  9 am - 6 pm, M-F.  Also accepts Medicaid/Medicare and self-pay.  °[facility] ° [address], [place] Phone: (336) 832-3150, Fax: (336) 832-3151. Hours of Operation:  8:30 am - 5:30 pm, M-F.  Also accepts Medicaid and self-pay.  °[facility] [place] [address], [place] Phone: (336) 878-6027   °[facility] [address], [place], [place] (336)723-1848, Ext. 123 Mondays & Thursdays: 7-9 AM.  First 15 patients are seen on a first come, first serve basis. °  ° °Medicaid-accepting [place] Providers: ° °Organization         Address  Phone   Notes  °[facility] [address], [place] (336) 641-2100 Also accepts self-pay patients.  °[facility] [address], [place] ° (336) 856-9996   °[facility] [address], [place]  (336) 288-8857   °[facility] [address] [place] [address], [place] (336) 299-7000   °Veita Bland [address], [place]  ° (336) 373-1557 Only accepts [place] Access Medicaid patients after they have their name applied to their card.  ° °Self-Pay (no insurance) in [place]: ° °Organization         Address  Phone   Notes  °Sickle Cell Patients, [facility] [address], [place] (336) 832-1970   °[facility] [address], [place] (336) 832-4400   °[facility] ° [address], [place] (336) 992-4800   °[facility]/Dr. Osei-Bonsu ° [address] [place] [address], [place] or [address], [place] (336) 841-8500 Phone number for both [place] and [place] locations is the same.  °[facility] [address], [place] (336) 299-0000   °[facility] [address] [place] [address], [place] or [address] (336) 852-7530 °(336) 878-2260   °[facility] [address], [place] (336) 350-1642, phone; (336) 294-5005, fax Sees patients 1st and 3rd Saturday of every month.  Must not qualify for public or private insurance (i.e. Medicaid, Medicare, [place] Health Choice, Veterans' Benefits) • Household income should be no more than 200% of the poverty level •The clinic cannot treat you if you are pregnant or think you are pregnant • Sexually transmitted diseases are not treated at the clinic.  ° ° °Dental Care: °Organization         Address  Phone  Notes  °[place] [facility] [address], [place] (336) 641-6152 Accepts children up to age 21 who are enrolled in Medicaid or [place] Health Choice; pregnant women with a Medicaid card; and children who have applied for Medicaid or [place] Health Choice, but were declined, whose parents can pay a reduced fee at time of service.  °[place]  [facility] [place]  [address], [place] (336) 641-7733 Accepts children up to age 21 who are enrolled in Medicaid or [place] Health Choice; pregnant women with a Medicaid card; and children who have applied for Medicaid or [place] Health Choice, but were declined, whose parents can pay a reduced fee at time of service.  °[facility] ° [address], [place] (336) 641-4533 Patients are seen by appointment only. Walk-ins are not accepted. [facility] will see patients 18 years of age and older. °Monday - Tuesday (8am-5pm) °Most Wednesdays (8:30-5pm) °$30 per visit, cash only  °[facility] ° [address], [place] (336) 641-4533 Patients are seen by appointment only. Walk-ins are not accepted. [facility] will see patients 18 years of age and older. °One   Wednesday Evening (Monthly: Volunteer Based).  $30 per visit, cash only  °[facility]  (919) 537-3737 for adults; Children under age 4, call Graduate Pediatric Dentistry at ([phone]) [phone]. Children aged 4-14, please call ([phone]) [phone] to request a pediatric application. ° Dental services are provided in all areas of dental care including fillings, crowns and bridges, complete and partial dentures, implants, gum treatment, root canals, and extractions. Preventive care is also provided. Treatment is provided to both adults and children. °Patients are selected via a lottery and there is often a waiting list. °  °[facility] [address], °[place] ° (336) 763-8833 [email or web address].drcivils.[email or web address] °  °[facility] [address], [place], [place] (336)723-1848, Ext. 123 Second and Fourth Thursday of each month, opens at 6:30 AM; Clinic ends at 9 AM.  Patients are seen on a first-come first-served basis, and a limited number are seen during each clinic.  ° °[facility] ° [address], [place], [place] (336) 723-7904    Eligibility Requirements °You must have lived in [place], [place], or [place] counties for at least the last three months. °  You cannot be eligible for state or federal sponsored healthcare insurance, including Veterans Administration, Medicaid, or Medicare. °  You generally cannot be eligible for healthcare insurance through your employer.  °  How to apply: °Eligibility screenings are held every Tuesday and Wednesday afternoon from 1:00 pm until 4:00 pm. You do not need an appointment for the interview!  °[facility] [address], [place], [place] 336-631-2330   °[facility]  336-342-8273   °[facility]  336-703-3100   °[facility]  336-570-6415   ° °Behavioral Health Resources in the Community: °Intensive Outpatient Programs °Organization         Address  Phone  Notes  °[facility] [address], [place], [place] 336-878-6098   °[facility] [address], [place], [place] 336-832-9800   °[facility] [address], [place], [place] ° 336-882-2125   °[facility] [address],  °[place], [place] 1-800-853-5163 or 336-641-4981   °Substance Abuse Resources °Organization         Address  Phone  Notes  °[facility]  336-882-2125   °[facility]  336-784-9470   °[facility]  336-285-9073   °[facility]  336-845-3988   °Residential & Outpatient Substance Abuse Program  1-800-659-3381   °Psychological Services °Organization         Address  Phone  Notes  °[facility]  336- 832-9600   °[facility]  336- 378-7881   °[facility] [address], [place] 1-800-853-5163 or 336-641-4981   ° °Mobile Crisis Teams °Organization         Address  Phone  Notes  °[facility], Mobile Crisis Care Unit  1-877-626-1772   °Assertive °[facility] ° [address]  [place], [place] 336-834-9664   °Sharon DeEsch [address] °[place]  336-554-5454   ° °Self-Help/Support Groups °Organization         Address  Phone             Notes  °[facility] [place] - variety of support groups  336- 373-1402 Call for more information  °Narcotics Anonymous (NA), [facility] [address], °[place]   2 meetings at this location  ° °  Residential Treatment Programs Organization         Address  Phone  Notes  [facility] [address]    [place]  1-718-623-8960   [facility]  [address], [place] [address], [place], [place]   [place], [place] [phone] Admissions: 8am-3pm M-F  [facility] [address],    [place], [place] [phone]   [facility] [address] [place], [place], [place]   [facility] [address]  [place], [place]   [facility] Intensive Outpatient [address], [place], [place]   [facility] ([place]  [place], [place] 1-(541) 590-4068 or 6133836264   [facility] [address], [place], [place] Accepts Medicaid  [facility] [address]  [place] 1-8253875621 Substance Abuse/Addiction Treatment   [facility] Organization         Address  Phone  Notes  [facility]  769-755-7472   Domenic Schwab, PhD [address] [place], [place]   607-356-9580 or (816)121-8214   [facility] [place], [place] (760) 137-3289   [facility] [address] [place], [place] (530)767-7663 Insurance/Medicaid/sponsorship through [facility] and Families [address]                                    [place], [place] 203-472-5320 [place] [address], [place] [phone]    Dr. Adele Schilder  [phone]   [facility] Dept. 1) [address], [place] 2) [place] 3)  [place], [place] [phone]  706-037-1034   [place] 734-130-3499 or [phone] (After Hours)      Take the prescriptions as directed.  Call your regular medical doctor today to schedule a follow up appointment within the next 2 days.  Return to the Emergency Department immediately sooner if worsening.
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
## 2015-05-17 ENCOUNTER — Other Ambulatory Visit: Payer: Self-pay | Admitting: Internal Medicine

## 2015-05-20 NOTE — Telephone Encounter (Signed)
Has he had any reaction to Losartan? I am not sure how he ended up back on Losartan. We will need to change that when he comes back for a follow up

## 2015-05-20 NOTE — Telephone Encounter (Signed)
Nurse called patient, patient verified date of birth. Patient aware of nurse sending 1 month supply of medications to pharmacy due to patient needing appointment to be seen. Patient agrees to make appointment to be seen.  Patient requests prescriptions to be [facility] in [place] or [place], [place].  Phone call was disconnected when nurse transferred patient to front office staff. [place] office staff is calling patient to make appointment.

## 2015-05-21 NOTE — Telephone Encounter (Signed)
He will need to wait on Losartan. It looks like he has been on it without problems. But to be sure I will address at his follow up visit

## 2015-05-21 NOTE — Telephone Encounter (Signed)
Nurse called patient, patient verified date of birth. Patient explains losartan put him in the hospital because he is allergic to it. He explains there is another cholesterol medicine he is on but not sure of name.  Nurse will send [place] Flow message concerning medication refills.

## 2015-05-22 ENCOUNTER — Other Ambulatory Visit: Payer: Self-pay

## 2015-05-22 DIAGNOSIS — I10 Essential (primary) hypertension: Secondary | ICD-10-CM

## 2015-05-22 DIAGNOSIS — M1A9XX0 Chronic gout, unspecified, without tophus (tophi): Secondary | ICD-10-CM

## 2015-05-22 DIAGNOSIS — K30 Functional dyspepsia: Secondary | ICD-10-CM

## 2015-05-22 MED ORDER — AMLODIPINE BESYLATE 10 MG PO TABS: 10.0000 mg | ORAL_TABLET | Freq: Every day | ORAL | Status: DC

## 2015-05-22 MED ORDER — OMEPRAZOLE 20 MG PO CPDR: 20.0000 mg | DELAYED_RELEASE_CAPSULE | Freq: Every day | ORAL | Status: DC

## 2015-05-22 MED ORDER — ALLOPURINOL 300 MG PO TABS: 300.0000 mg | ORAL_TABLET | Freq: Every day | ORAL | Status: DC

## 2015-06-05 ENCOUNTER — Encounter: Payer: Self-pay | Admitting: Internal Medicine

## 2015-06-05 ENCOUNTER — Ambulatory Visit: Payer: BLUE CROSS/BLUE SHIELD | Attending: Internal Medicine | Admitting: Internal Medicine

## 2015-06-05 VITALS — BP 138/88 | HR 93 | Temp 98.0°F | Resp 16 | Ht 74.0 in | Wt 238.0 lb

## 2015-06-05 DIAGNOSIS — I10 Essential (primary) hypertension: Secondary | ICD-10-CM | POA: Insufficient documentation

## 2015-06-05 DIAGNOSIS — M1A9XX0 Chronic gout, unspecified, without tophus (tophi): Secondary | ICD-10-CM | POA: Insufficient documentation

## 2015-06-05 DIAGNOSIS — F17210 Nicotine dependence, cigarettes, uncomplicated: Secondary | ICD-10-CM | POA: Diagnosis not present

## 2015-06-05 DIAGNOSIS — K219 Gastro-esophageal reflux disease without esophagitis: Secondary | ICD-10-CM | POA: Insufficient documentation

## 2015-06-05 DIAGNOSIS — E119 Type 2 diabetes mellitus without complications: Secondary | ICD-10-CM | POA: Diagnosis not present

## 2015-06-05 DIAGNOSIS — Z794 Long term (current) use of insulin: Secondary | ICD-10-CM | POA: Insufficient documentation

## 2015-06-05 DIAGNOSIS — E785 Hyperlipidemia, unspecified: Secondary | ICD-10-CM | POA: Diagnosis not present

## 2015-06-05 DIAGNOSIS — Z79899 Other long term (current) drug therapy: Secondary | ICD-10-CM | POA: Diagnosis not present

## 2015-06-05 LAB — URIC ACID: Uric Acid, Serum: 3.7 mg/dL — ABNORMAL LOW (ref 4.0–7.8)

## 2015-06-05 LAB — BASIC METABOLIC PANEL
BUN: 22 mg/dL (ref 7–25)
CO2: 24 mmol/L (ref 20–31)
Calcium: 9.2 mg/dL (ref 8.6–10.3)
Chloride: 98 mmol/L (ref 98–110)
Creat: 1.09 mg/dL (ref 0.70–1.25)
Glucose, Bld: 87 mg/dL (ref 65–99)
Potassium: 4.4 mmol/L (ref 3.5–5.3)
Sodium: 136 mmol/L (ref 135–146)

## 2015-06-05 LAB — GLUCOSE, POCT (MANUAL RESULT ENTRY): POC Glucose: 110 mg/dl — AB (ref 70–99)

## 2015-06-05 LAB — POCT GLYCOSYLATED HEMOGLOBIN (HGB A1C): Hemoglobin A1C: 5.5

## 2015-06-05 MED ORDER — METFORMIN HCL 1000 MG PO TABS: 1000.0000 mg | ORAL_TABLET | Freq: Two times a day (BID) | ORAL | Status: DC

## 2015-06-05 MED ORDER — PREDNISONE 20 MG PO TABS: 20.0000 mg | ORAL_TABLET | Freq: Every day | ORAL | Status: DC

## 2015-06-05 MED ORDER — AMLODIPINE BESYLATE 10 MG PO TABS: 10.0000 mg | ORAL_TABLET | Freq: Every day | ORAL | Status: DC

## 2015-06-05 MED ORDER — ATORVASTATIN CALCIUM 20 MG PO TABS: 20.0000 mg | ORAL_TABLET | Freq: Every day | ORAL | Status: DC

## 2015-06-05 MED ORDER — HYDROCHLOROTHIAZIDE 12.5 MG PO CAPS: 12.5000 mg | ORAL_CAPSULE | Freq: Every day | ORAL | Status: DC

## 2015-06-05 MED ORDER — ALLOPURINOL 300 MG PO TABS
300.0000 mg | ORAL_TABLET | Freq: Every day | ORAL | Status: DC
Start: 2015-06-05 — End: 2016-01-21

## 2015-06-05 MED ORDER — TRAMADOL HCL 50 MG PO TABS: 50.0000 mg | ORAL_TABLET | Freq: Three times a day (TID) | ORAL | Status: DC | PRN

## 2015-06-05 NOTE — Progress Notes (Signed)
Patient ID: [name], male   DOB: January 08, 1956, 59 y.o.   MRN: [id number] CC: follow up  HPI: [name] is a 59 y.o. male here today for a follow up visit.  Patient has past medical history of gout, HTN, GERD, diabetes, and arthritis. Patient reports that he recently went to the ER for a gout flare and was given Norco and Indocin. He states that he has been taking his allopurinol daily and has not had relief with pain medication. He states that he works on his feet all day and would like additional relief.  Patient reports that he was on Losartan for almost one year without problems. He reports that he was originally given Losartan from a different PCP after his angioedema episode. He has not had the medication in over two weeks and has only been taking amlodipine. He denies any complications of diabetes and takes medication without difficulty.  Allergies  Allergen Reactions  . Ace Inhibitors     Angioedema   . Aceite Annita Brod Oil]    Past Medical History  Diagnosis Date  . Acid reflux   . Gout   . Hypertension   . Diabetes mellitus   . Hyperlipidemia   . Incarcerated ventral hernia   . Mouth bleeding     upper teeth right back side loose and bleed at night  . Arthritis    Current Outpatient Prescriptions on File Prior to Visit  Medication Sig Dispense Refill  . HYDROcodone-acetaminophen (NORCO/VICODIN) 5-325 MG tablet 1 or 2 tabs PO q6 hours prn pain 20 tablet 0  . indomethacin (INDOCIN) 25 MG capsule 67m PO TID x2 days, then 274mPO TID x3 days 21 capsule 0  . omeprazole (PRILOSEC) 20 MG capsule Take 1 capsule (20 mg total) by mouth daily. 30 capsule 3  . albuterol (PROVENTIL HFA;VENTOLIN HFA) 108 (90 BASE) MCG/ACT inhaler Inhale 2 puffs into the lungs every 6 (six) hours as needed for wheezing or shortness of breath. 1 Inhaler 4  . docusate sodium (COLACE) 100 MG capsule Take 1 capsule (100 mg total) by mouth 2 (two) times daily. (Patient not taking: Reported  on 05/16/2015) 100 capsule 3  . glucose blood test strip Use as instructed 100 each 12  . glucose monitoring kit (FREESTYLE) monitoring kit 1 each by Does not apply route as needed for other. 1 each 0  . Insulin Pen Needle (PEN NEEDLES) 31G X 6 MM MISC Use lantus every night at hours of sleep 50 each 3  . Lancets (FREESTYLE) lancets Use as instructed 100 each 12   Current Facility-Administered Medications on File Prior to Visit  Medication Dose Route Frequency Provider Last Rate Last Dose  . 0.9 %  sodium chloride infusion   Intravenous Continuous VaLance BoschNP       Family History  Problem Relation Age of Onset  . Diabetes Mother   . Cancer Father   . Multiple sclerosis Sister   . Heart failure Brother    Social History   Social History  . Marital Status: Single    Spouse Name: N/A  . Number of Children: N/A  . Years of Education: N/A   Occupational History  . Not on file.   Social History Main Topics  . Smoking status: Current Every Day Smoker -- 0.50 packs/day    Types: Cigarettes  . Smokeless tobacco: Never Used  . Alcohol Use: 0.0 oz/week     Comment: weekends only  . Drug Use: No  .  Sexual Activity: No   Other Topics Concern  . Not on file   Social History Narrative    Review of Systems: Other than what is stated in HPI, all other systems are negative.   Objective:   Filed Vitals:   06/05/15 1225  BP: 138/88  Pulse: 93  Temp: 98 F (36.7 C)  Resp: 16    Physical Exam  Constitutional: He is oriented to person, place, and time.  Neck: No JVD present.  Cardiovascular: Normal rate, regular rhythm and normal heart sounds.   Pulmonary/Chest: Effort normal and breath sounds normal.  Musculoskeletal: Normal range of motion. He exhibits tenderness (lateral right great toe). He exhibits no edema.  Feet:  Right Foot:  Skin Integrity: Negative for skin breakdown.  Left Foot:  Skin Integrity: Negative for skin breakdown.  Neurological: He is alert and  oriented to person, place, and time.  Skin: Skin is warm and dry. No erythema.  Psychiatric: He has a normal mood and affect.     Lab Results  Component Value Date   WBC 6.6 08/13/2014   HGB 13.9 08/13/2014   HCT 41.1 08/13/2014   MCV 92.4 08/13/2014   PLT 267 08/13/2014   Lab Results  Component Value Date   CREATININE 1.09 06/05/2015   BUN 22 06/05/2015   NA 136 06/05/2015   K 4.4 06/05/2015   CL 98 06/05/2015   CO2 24 06/05/2015    Lab Results  Component Value Date   HGBA1C 5.50 06/05/2015   Lipid Panel     Component Value Date/Time   CHOL 173 09/04/2014 0918   TRIG 114 09/04/2014 0918   HDL 56 09/04/2014 0918   CHOLHDL 3.1 09/04/2014 0918   VLDL 23 09/04/2014 0918   LDLCALC 94 09/04/2014 0918       Assessment and plan:   [name] was seen today for follow-up.  Diagnoses and all orders for this visit:  Type 2 diabetes mellitus without complication, without long-term current use of insulin (HCC) -     Glucose (CBG) -     HgB A1c -     metFORMIN (GLUCOPHAGE) 1000 MG tablet; Take 1 tablet (1,000 mg total) by mouth 2 (two) times daily with a meal. Patients diabetes is well control as evidence by consistently low a1c.  Patient will continue with current therapy and continue to make necessary lifestyle changes.  Reviewed foot care, diet, exercise, annual health maintenance with patient.   Essential hypertension -     Basic Metabolic Panel -     hydrochlorothiazide (MICROZIDE) 12.5 MG capsule; Take 1 capsule (12.5 mg total) by mouth daily. -     amLODipine (NORVASC) 10 MG tablet; Take 1 tablet (10 mg total) by mouth daily. Will d/c Losartan because of past angioedema and switch to low dose HCTZ. If HCTZ not tolerated due to gout I will find another agent. Repeat BP in 2 weeks  Chronic gout without tophus, unspecified cause, unspecified site -     Uric Acid -     allopurinol (ZYLOPRIM) 300 MG tablet; Take 1 tablet (300 mg total) by mouth daily. -     traMADol  (ULTRAM) 50 MG tablet; Take 1 tablet (50 mg total) by mouth every 8 (eight) hours as needed. For gout pain -     predniSONE (DELTASONE) 20 MG tablet; Take 1 tablet (20 mg total) by mouth daily with breakfast. For gout flare Will add prednisone for better relief, Tramadol for pain. See above.  HLD (hyperlipidemia) -  atorvastatin (LIPITOR) 20 MG tablet; Take 1 tablet (20 mg total) by mouth daily. Education provided on proper lifestyle changes in order to lower cholesterol. Patient advised to maintain healthy weight and to keep total fat intake at 25-35% of total calories and carbohydrates 50-60% of total daily calories. Explained how high cholesterol places patient at risk for heart disease. Patient placed on appropriate medication and repeat labs in 6 months    Return in about 2 weeks (around 06/19/2015) for Nurse Visit-BP check and 3 mo , DM/HTN.       Lance Bosch, [facility] [phone] 06/10/2015, 9:56 AM +

## 2015-06-05 NOTE — Patient Instructions (Addendum)
[employer] ? Directions 4.0 6 Google reviews [facility] Address: [address], [place], [place] [address] [phone] Hours: Open today  9AM-8PM  Dr. Webb Laws ? WebsiteDirections 5.0 43 Google reviews [facility] Address: [address] [place], [place] [address] Phone: [phone]  [facility] ? [place] Address: [address], [place], [place] [address] Phone: [phone] Hours: Open today  8AM-5PM

## 2015-06-05 NOTE — Progress Notes (Signed)
Patient here for follow up on his diabetes and hypertension Patient also requesting a refill on his hydrocodone for his gout flair up

## 2015-06-06 ENCOUNTER — Telehealth: Payer: Self-pay

## 2015-06-06 NOTE — Telephone Encounter (Signed)
Returned patient phone call Patient is requesting a copy of his blood work Blood work printed and is in an envelope at the front desk

## 2015-06-06 NOTE — Telephone Encounter (Signed)
Patient called requesting a copy of lab results

## 2015-06-06 NOTE — Telephone Encounter (Signed)
-----   Message from Lance Bosch, NP sent at 06/06/2015  8:39 AM EST ----- Blood looks good.

## 2015-06-06 NOTE — Telephone Encounter (Signed)
Spoke with patient this am and he is aware of his normal blood results

## 2015-06-19 ENCOUNTER — Ambulatory Visit: Payer: BLUE CROSS/BLUE SHIELD | Attending: Internal Medicine | Admitting: Pharmacist

## 2015-06-19 ENCOUNTER — Other Ambulatory Visit: Payer: Self-pay | Admitting: Internal Medicine

## 2015-06-19 VITALS — BP 132/78 | HR 96

## 2015-06-19 DIAGNOSIS — M1A9XX0 Chronic gout, unspecified, without tophus (tophi): Secondary | ICD-10-CM

## 2015-06-19 DIAGNOSIS — M109 Gout, unspecified: Secondary | ICD-10-CM

## 2015-06-19 DIAGNOSIS — I10 Essential (primary) hypertension: Secondary | ICD-10-CM | POA: Diagnosis present

## 2015-06-19 DIAGNOSIS — Z79899 Other long term (current) drug therapy: Secondary | ICD-10-CM | POA: Diagnosis not present

## 2015-06-19 MED ORDER — COLCHICINE 0.6 MG PO TABS: ORAL_TABLET | ORAL | Status: DC

## 2015-06-19 NOTE — Patient Instructions (Signed)
Your blood pressure looks great!  Schedule an appointment to see Chari Manning since the gout is not getting better

## 2015-06-19 NOTE — Progress Notes (Signed)
S:    Patient arrives in good spirits.    Presents to the clinic for hypertension evaluation.   Patient reports adherence with medications.  Current BP Medications include:  HCTZ 25 mg  Antihypertensives tried in the past include: ACEi (angioedemia), ARB  Patient reports that his gout is not improving. He has completed the course of prednisone and the joint is still swollen and he is in a lot of pain.    O:   Last 3 Office BP readings: BP Readings from Last 3 Encounters:  06/19/15 132/78  06/05/15 138/88  05/17/15 156/97    BMET    Component Value Date/Time   NA 136 06/05/2015 1308   K 4.4 06/05/2015 1308   CL 98 06/05/2015 1308   CO2 24 06/05/2015 1308   GLUCOSE 87 06/05/2015 1308   BUN 22 06/05/2015 1308   CREATININE 1.09 06/05/2015 1308   CREATININE 0.80 02/03/2013 0349   CALCIUM 9.2 06/05/2015 1308   GFRNONAA 76 08/13/2014 1032   GFRNONAA >90 02/03/2013 0349   GFRAA 88 08/13/2014 1032   GFRAA >90 02/03/2013 0349    A/P: History of hypertension currently controlled on current medications. Continue HCTZ 25 mg daily. Will monitor for future gout attacks after this one has resolved and will have to consider another agent if he has recurrent gout attacks.  Discussed case with Chari Manning, NP, and she will prescribe colchicine for gout. Patient counseled on medication, including how to take it. Instructed patient to not take the atorvastatin the days he is taking the colchicine. Patient will call clinic next week if symptoms have not improved.   Results reviewed and written information provided.   Total time in face-to-face counseling 20 minutes.  F/U Clinic Visit with Chari Manning, NP, as directed.

## 2015-07-01 ENCOUNTER — Telehealth: Payer: Self-pay

## 2015-07-01 ENCOUNTER — Telehealth: Payer: Self-pay | Admitting: Internal Medicine

## 2015-07-01 DIAGNOSIS — M109 Gout, unspecified: Secondary | ICD-10-CM

## 2015-07-01 MED ORDER — COLCHICINE 0.6 MG PO TABS: ORAL_TABLET | ORAL | Status: DC

## 2015-07-01 NOTE — Telephone Encounter (Signed)
Returned patient phone call Patient stated he was having a gout flair up and requesting a refill  on his colchine RX sent to [facility] on [address]

## 2015-07-01 NOTE — Telephone Encounter (Signed)
Patient is having another gout flare up and is in a lot of pain. We do not have any available slots with valerie until next week and pt is wondering if we can prescribe him colchicine 0.6 MG tablet to last him until he is able to see pcp. Please follow up with pt. Thank you.

## 2015-07-10 ENCOUNTER — Other Ambulatory Visit: Payer: Self-pay | Admitting: Internal Medicine

## 2015-07-15 ENCOUNTER — Telehealth: Payer: Self-pay | Admitting: Internal Medicine

## 2015-07-15 MED FILL — traMADol HCL 50 MG TABS: 50 | 10 days supply | Qty: 30 | Fill #0

## 2015-07-15 NOTE — Telephone Encounter (Signed)
Pharmacy faxed medication request for, Tramadol Please follow up

## 2015-07-15 NOTE — Telephone Encounter (Signed)
Pharmacy faxed over refill request for Tramadol, please follow up.

## 2015-09-19 ENCOUNTER — Other Ambulatory Visit: Payer: Self-pay | Admitting: Internal Medicine

## 2015-09-28 ENCOUNTER — Other Ambulatory Visit: Payer: Self-pay | Admitting: Internal Medicine

## 2015-10-20 ENCOUNTER — Other Ambulatory Visit: Payer: Self-pay | Admitting: Internal Medicine

## 2015-11-18 ENCOUNTER — Emergency Department (HOSPITAL_COMMUNITY): Payer: Self-pay

## 2015-11-18 ENCOUNTER — Encounter (HOSPITAL_COMMUNITY): Payer: Self-pay | Admitting: *Deleted

## 2015-11-18 ENCOUNTER — Emergency Department (HOSPITAL_COMMUNITY)
Admission: EM | Admit: 2015-11-18 | Discharge: 2015-11-18 | Disposition: A | Discharge status: Home / Self Care | Payer: Self-pay | Attending: Emergency Medicine | Admitting: Emergency Medicine

## 2015-11-18 DIAGNOSIS — F17210 Nicotine dependence, cigarettes, uncomplicated: Secondary | ICD-10-CM | POA: Insufficient documentation

## 2015-11-18 DIAGNOSIS — I10 Essential (primary) hypertension: Secondary | ICD-10-CM | POA: Insufficient documentation

## 2015-11-18 DIAGNOSIS — Z794 Long term (current) use of insulin: Secondary | ICD-10-CM | POA: Insufficient documentation

## 2015-11-18 DIAGNOSIS — R55 Syncope and collapse: Secondary | ICD-10-CM | POA: Insufficient documentation

## 2015-11-18 DIAGNOSIS — E785 Hyperlipidemia, unspecified: Secondary | ICD-10-CM | POA: Insufficient documentation

## 2015-11-18 DIAGNOSIS — R4020 Unspecified coma: Secondary | ICD-10-CM

## 2015-11-18 DIAGNOSIS — Z7951 Long term (current) use of inhaled steroids: Secondary | ICD-10-CM | POA: Insufficient documentation

## 2015-11-18 DIAGNOSIS — E119 Type 2 diabetes mellitus without complications: Secondary | ICD-10-CM | POA: Insufficient documentation

## 2015-11-18 DIAGNOSIS — Z7984 Long term (current) use of oral hypoglycemic drugs: Secondary | ICD-10-CM | POA: Insufficient documentation

## 2015-11-18 DIAGNOSIS — K219 Gastro-esophageal reflux disease without esophagitis: Secondary | ICD-10-CM | POA: Insufficient documentation

## 2015-11-18 DIAGNOSIS — F1010 Alcohol abuse, uncomplicated: Secondary | ICD-10-CM | POA: Insufficient documentation

## 2015-11-18 DIAGNOSIS — Z79899 Other long term (current) drug therapy: Secondary | ICD-10-CM | POA: Insufficient documentation

## 2015-11-18 DIAGNOSIS — M1990 Unspecified osteoarthritis, unspecified site: Secondary | ICD-10-CM | POA: Insufficient documentation

## 2015-11-18 DIAGNOSIS — R040 Epistaxis: Secondary | ICD-10-CM | POA: Insufficient documentation

## 2015-11-18 DIAGNOSIS — Z79891 Long term (current) use of opiate analgesic: Secondary | ICD-10-CM | POA: Insufficient documentation

## 2015-11-18 LAB — COMPREHENSIVE METABOLIC PANEL
ALT: 29 U/L (ref 17–63)
AST: 32 U/L (ref 15–41)
Albumin: 4.2 g/dL (ref 3.5–5.0)
Alkaline Phosphatase: 86 U/L (ref 38–126)
Anion gap: 9 (ref 5–15)
BUN: 26 mg/dL — ABNORMAL HIGH (ref 6–20)
CO2: 23 mmol/L (ref 22–32)
Calcium: 8.7 mg/dL — ABNORMAL LOW (ref 8.9–10.3)
Chloride: 107 mmol/L (ref 101–111)
Creatinine, Ser: 1.21 mg/dL (ref 0.61–1.24)
GFR calc Af Amer: >60 mL/min (ref >60)
GFR calc non Af Amer: >60 mL/min (ref >60)
Glucose, Bld: 97 mg/dL (ref 65–99)
Potassium: 3.9 mmol/L (ref 3.5–5.1)
Sodium: 139 mmol/L (ref 135–145)
Total Bilirubin: 0.5 mg/dL (ref 0.3–1.2)
Total Protein: 7.6 g/dL (ref 6.5–8.1)

## 2015-11-18 LAB — I-STAT TROPONIN, ED: Troponin i, poc: 0 ng/mL (ref 0.00–0.08)

## 2015-11-18 LAB — CBC WITH DIFFERENTIAL/PLATELET
Basophils Absolute: 0.1 10*3/uL (ref 0.0–0.1)
Basophils Relative: 1 %
Eosinophils Absolute: 0.2 10*3/uL (ref 0.0–0.7)
Eosinophils Relative: 5 %
HCT: 36.4 % — ABNORMAL LOW (ref 39.0–52.0)
Hemoglobin: 12.4 g/dL — ABNORMAL LOW (ref 13.0–17.0)
Lymphocytes Relative: 43 %
Lymphs Abs: 2.2 10*3/uL (ref 0.7–4.0)
MCH: 31.9 pg (ref 26.0–34.0)
MCHC: 34.1 g/dL (ref 30.0–36.0)
MCV: 93.6 fL (ref 78.0–100.0)
Monocytes Absolute: 0.4 10*3/uL (ref 0.1–1.0)
Monocytes Relative: 7 %
Neutro Abs: 2.3 10*3/uL (ref 1.7–7.7)
Neutrophils Relative %: 44 %
Platelets: 239 10*3/uL (ref 150–400)
RBC: 3.89 MIL/uL — ABNORMAL LOW (ref 4.22–5.81)
RDW: 15 % (ref 11.5–15.5)
WBC: 5.2 10*3/uL (ref 4.0–10.5)

## 2015-11-18 LAB — ETHANOL: Alcohol, Ethyl (B): 142 mg/dL — ABNORMAL HIGH (ref <5)

## 2015-11-18 MED ORDER — SODIUM CHLORIDE 0.9 % IV BOLUS (SEPSIS)
1000.0000 mL | Freq: Once | INTRAVENOUS | Status: AC
  Administered 2015-11-18: 1000 mL via INTRAVENOUS

## 2015-11-18 NOTE — ED Notes (Signed)
Per pt report coming from home and reports he may have taken given the wrong prescription for his blood pressure since his normal provider at his primary care office is no longer there.  Pt reports a burning and itching sensation bilaterally in the lower parts of his legs.  Pt also reports he had a syncopal episode last night.  Pt report feeling SOB.  Pt a/o x 4 and able to talk in complete sentences.  Pt ambulated from triage to room without any difficulties.
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
## 2015-12-06 ENCOUNTER — Other Ambulatory Visit: Payer: Self-pay | Admitting: Internal Medicine

## 2016-01-21 ENCOUNTER — Telehealth: Payer: Self-pay | Admitting: Internal Medicine

## 2016-01-21 DIAGNOSIS — I10 Essential (primary) hypertension: Secondary | ICD-10-CM

## 2016-01-21 DIAGNOSIS — E119 Type 2 diabetes mellitus without complications: Secondary | ICD-10-CM

## 2016-01-21 DIAGNOSIS — M1A9XX0 Chronic gout, unspecified, without tophus (tophi): Secondary | ICD-10-CM

## 2016-01-21 DIAGNOSIS — E785 Hyperlipidemia, unspecified: Secondary | ICD-10-CM

## 2016-01-21 MED ORDER — ALLOPURINOL 300 MG PO TABS: 300.0000 mg | ORAL_TABLET | Freq: Every day | ORAL | Status: DC

## 2016-01-21 MED ORDER — METFORMIN HCL 1000 MG PO TABS: 1000.0000 mg | ORAL_TABLET | Freq: Two times a day (BID) | ORAL | Status: DC

## 2016-01-21 MED ORDER — AMLODIPINE BESYLATE 10 MG PO TABS: 10.0000 mg | ORAL_TABLET | Freq: Every day | ORAL | Status: DC

## 2016-01-21 MED ORDER — HYDROCHLOROTHIAZIDE 12.5 MG PO CAPS: 12.5000 mg | ORAL_CAPSULE | Freq: Every day | ORAL | Status: DC

## 2016-01-21 MED ORDER — ATORVASTATIN CALCIUM 20 MG PO TABS: 20.0000 mg | ORAL_TABLET | Freq: Every day | ORAL | Status: DC

## 2016-01-21 NOTE — Telephone Encounter (Signed)
Pt called requesting a refill on all his medications. Pt could not name all Rx. Did state metformin was one   Pt called originally to get an appt to re-establish care, but there are no appts till August  Pt will call next week to schedule appt in August

## 2016-01-21 NOTE — Telephone Encounter (Signed)
Medications refilled x 30 days - patient needs to re-establish in August.

## 2016-01-22 ENCOUNTER — Other Ambulatory Visit: Payer: Self-pay | Admitting: Internal Medicine

## 2016-01-22 NOTE — Telephone Encounter (Signed)
Refill HCTZ

## 2016-01-22 NOTE — Telephone Encounter (Signed)
Refill request unclear? 

## 2016-02-04 ENCOUNTER — Ambulatory Visit: Payer: BLUE CROSS/BLUE SHIELD | Attending: Family Medicine | Admitting: Family Medicine

## 2016-02-04 ENCOUNTER — Encounter: Payer: Self-pay | Admitting: Family Medicine

## 2016-02-04 VITALS — BP 139/85 | HR 90 | Temp 98.6°F | Ht 74.0 in | Wt 233.6 lb

## 2016-02-04 DIAGNOSIS — Z7984 Long term (current) use of oral hypoglycemic drugs: Secondary | ICD-10-CM | POA: Diagnosis not present

## 2016-02-04 DIAGNOSIS — E785 Hyperlipidemia, unspecified: Secondary | ICD-10-CM | POA: Insufficient documentation

## 2016-02-04 DIAGNOSIS — E119 Type 2 diabetes mellitus without complications: Secondary | ICD-10-CM | POA: Diagnosis present

## 2016-02-04 DIAGNOSIS — Z79899 Other long term (current) drug therapy: Secondary | ICD-10-CM | POA: Insufficient documentation

## 2016-02-04 DIAGNOSIS — Z794 Long term (current) use of insulin: Secondary | ICD-10-CM | POA: Insufficient documentation

## 2016-02-04 DIAGNOSIS — R0981 Nasal congestion: Secondary | ICD-10-CM | POA: Insufficient documentation

## 2016-02-04 DIAGNOSIS — Z9889 Other specified postprocedural states: Secondary | ICD-10-CM | POA: Diagnosis not present

## 2016-02-04 DIAGNOSIS — I10 Essential (primary) hypertension: Secondary | ICD-10-CM

## 2016-02-04 DIAGNOSIS — M1A9XX0 Chronic gout, unspecified, without tophus (tophi): Secondary | ICD-10-CM | POA: Insufficient documentation

## 2016-02-04 LAB — GLUCOSE, POCT (MANUAL RESULT ENTRY): POC Glucose: 155 mg/dl — AB (ref 70–99)

## 2016-02-04 LAB — POCT GLYCOSYLATED HEMOGLOBIN (HGB A1C): Hemoglobin A1C: 5.4

## 2016-02-04 MED ORDER — FLUTICASONE PROPIONATE 50 MCG/ACT NA SUSP: 1.0000 | Freq: Every day | NASAL | 0 refills | Status: DC

## 2016-02-04 MED ORDER — ALLOPURINOL 300 MG PO TABS: 300.0000 mg | ORAL_TABLET | Freq: Every day | ORAL | 3 refills | Status: DC

## 2016-02-04 MED ORDER — AMLODIPINE BESYLATE 10 MG PO TABS: 10.0000 mg | ORAL_TABLET | Freq: Every day | ORAL | 3 refills | Status: DC

## 2016-02-04 MED ORDER — COLCHICINE 0.6 MG PO TABS: ORAL_TABLET | ORAL | 1 refills | Status: DC

## 2016-02-04 MED ORDER — METFORMIN HCL 1000 MG PO TABS
1000.0000 mg | ORAL_TABLET | Freq: Two times a day (BID) | ORAL | 3 refills | Status: DC
Start: 2016-02-04 — End: 2016-04-22

## 2016-02-04 MED ORDER — CETIRIZINE HCL 10 MG PO TABS: 10.0000 mg | ORAL_TABLET | Freq: Every day | ORAL | 3 refills | Status: DC

## 2016-02-04 MED ORDER — ATORVASTATIN CALCIUM 20 MG PO TABS: 20.0000 mg | ORAL_TABLET | Freq: Every day | ORAL | 3 refills | Status: DC
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
## 2016-02-04 NOTE — Patient Instructions (Signed)

## 2016-02-04 NOTE — Progress Notes (Signed)
meds refilled Sinus problems in the morning

## 2016-02-05 ENCOUNTER — Encounter: Payer: Self-pay | Admitting: Family Medicine

## 2016-02-09 ENCOUNTER — Other Ambulatory Visit: Payer: Self-pay | Admitting: Family Medicine

## 2016-02-09 ENCOUNTER — Ambulatory Visit (HOSPITAL_BASED_OUTPATIENT_CLINIC_OR_DEPARTMENT_OTHER): Payer: BLUE CROSS/BLUE SHIELD | Admitting: Physician Assistant

## 2016-02-09 ENCOUNTER — Ambulatory Visit: Payer: BLUE CROSS/BLUE SHIELD | Attending: Internal Medicine

## 2016-02-09 VITALS — BP 130/75 | HR 102 | Temp 98.4°F | Resp 16 | Ht 74.0 in | Wt 221.0 lb

## 2016-02-09 DIAGNOSIS — E108 Type 1 diabetes mellitus with unspecified complications: Secondary | ICD-10-CM | POA: Diagnosis not present

## 2016-02-09 DIAGNOSIS — E1065 Type 1 diabetes mellitus with hyperglycemia: Secondary | ICD-10-CM

## 2016-02-09 DIAGNOSIS — IMO0002 Reserved for concepts with insufficient information to code with codable children: Secondary | ICD-10-CM

## 2016-02-09 DIAGNOSIS — E785 Hyperlipidemia, unspecified: Secondary | ICD-10-CM | POA: Diagnosis not present

## 2016-02-09 DIAGNOSIS — Z794 Long term (current) use of insulin: Secondary | ICD-10-CM | POA: Diagnosis not present

## 2016-02-09 DIAGNOSIS — M109 Gout, unspecified: Secondary | ICD-10-CM | POA: Diagnosis not present

## 2016-02-09 DIAGNOSIS — I10 Essential (primary) hypertension: Secondary | ICD-10-CM | POA: Insufficient documentation

## 2016-02-09 DIAGNOSIS — E119 Type 2 diabetes mellitus without complications: Secondary | ICD-10-CM | POA: Diagnosis not present

## 2016-02-09 DIAGNOSIS — Z79899 Other long term (current) drug therapy: Secondary | ICD-10-CM | POA: Insufficient documentation

## 2016-02-09 DIAGNOSIS — M1A9XX0 Chronic gout, unspecified, without tophus (tophi): Secondary | ICD-10-CM | POA: Diagnosis not present

## 2016-02-09 DIAGNOSIS — Z7984 Long term (current) use of oral hypoglycemic drugs: Secondary | ICD-10-CM | POA: Diagnosis not present

## 2016-02-09 DIAGNOSIS — R252 Cramp and spasm: Secondary | ICD-10-CM

## 2016-02-09 DIAGNOSIS — R112 Nausea with vomiting, unspecified: Secondary | ICD-10-CM | POA: Diagnosis present

## 2016-02-09 LAB — BASIC METABOLIC PANEL
BUN: 21 mg/dL (ref 7–25)
CO2: 21 mmol/L (ref 20–31)
Calcium: 8.7 mg/dL (ref 8.6–10.3)
Chloride: 96 mmol/L — ABNORMAL LOW (ref 98–110)
Creat: 1.17 mg/dL (ref 0.70–1.25)
Glucose, Bld: 152 mg/dL — ABNORMAL HIGH (ref 65–99)
Potassium: 3.5 mmol/L (ref 3.5–5.3)
Sodium: 133 mmol/L — ABNORMAL LOW (ref 135–146)

## 2016-02-09 LAB — COMPLETE METABOLIC PANEL WITH GFR
ALT: 212 U/L — ABNORMAL HIGH (ref 9–46)
AST: 403 U/L — ABNORMAL HIGH (ref 10–35)
Albumin: 3.6 g/dL (ref 3.6–5.1)
Alkaline Phosphatase: 142 U/L — ABNORMAL HIGH (ref 40–115)
BUN: 21 mg/dL (ref 7–25)
CO2: 22 mmol/L (ref 20–31)
Calcium: 8.7 mg/dL (ref 8.6–10.3)
Chloride: 96 mmol/L — ABNORMAL LOW (ref 98–110)
Creat: 1.22 mg/dL (ref 0.70–1.25)
GFR, Est African American: 74 mL/min (ref >=60)
GFR, Est Non African American: 64 mL/min (ref >=60)
Glucose, Bld: 153 mg/dL — ABNORMAL HIGH (ref 65–99)
Potassium: 3.5 mmol/L (ref 3.5–5.3)
Sodium: 132 mmol/L — ABNORMAL LOW (ref 135–146)
Total Bilirubin: 1.8 mg/dL — ABNORMAL HIGH (ref 0.2–1.2)
Total Protein: 7.9 g/dL (ref 6.1–8.1)

## 2016-02-09 LAB — LIPID PANEL
Cholesterol: 131 mg/dL (ref 125–200)
HDL: 31 mg/dL — ABNORMAL LOW (ref >=40)
Total CHOL/HDL Ratio: 4.2 Ratio (ref <=5.0)
Triglycerides: 542 mg/dL — ABNORMAL HIGH (ref <150)

## 2016-02-09 LAB — URIC ACID: Uric Acid, Serum: 3.3 mg/dL — ABNORMAL LOW (ref 4.0–8.0)

## 2016-02-09 NOTE — Progress Notes (Signed)
C/o nausea x 2 days. Also cramps (legs, feet, arms) x 2 days. CBG 176. Was told to discontinue HCTZ but took this morning. Priscille Heidelberg, RN, BSN

## 2016-02-09 NOTE — Patient Instructions (Signed)
Drink plenty of water over the next 48 hours, maybe even have a small gatorade restart Prilosec Stop HCTZ (the fluid pill) Return in 3-4 days No work today

## 2016-02-09 NOTE — Progress Notes (Signed)
Chief Complaint: Cramping and vomiting  Subjective: This is a 60 year old male with history of type 2 diabetes, hypertension, gouty arthritis, hyperlipidemia and continues to smoke. He presented here for a routine lab draw and told the staff that he was having some issues. He states that since Sunday he been having episodes of being high, nauseous, couple vomiting episodes, and increasing fatigue. He admits that he was at a family function [place] in the heat all weekend and was drinking alcohol. He was not drinking any water. He did take his medications. In fact, he took HCTZ which he was told to stop a long time ago. Since then he has not been feeling right. He's been cramping now over. He's had 2 episodes where he has thrown up.   ROS:  GEN: denies fever or chills, denies change in weight Skin: denies lesions or rashes HEENT: denies headache, earache, epistaxis, sore throat, or neck pain LUNGS: denies SHOB, dyspnea, PND, orthopnea CV: denies CP or palpitations ABD: denies abd pain, N or V EXT: denies muscle spasms or swelling; no pain in lower ext, no weakness NEURO: denies numbness or tingling, denies sz, stroke or TIA   Objective:  Vitals:   02/09/16 1005  BP: 130/75  Pulse: (!) 102  Resp: 16  Temp: 98.4 F (36.9 C)  SpO2: 98%  Weight: 221 lb (100.2 kg)  Height: _0  (1.88 m)    Physical Exam:  General: in no acute distress. HEENT: no pallor, no icterus, moist oral mucosa, no JVD, no lymphadenopathy Heart: Normal  s1 &s2  Slightly fast but regular, without murmurs, rubs, gallops. Lungs: Clear to auscultation bilaterally. Abdomen: Soft, nontender, nondistended, positive bowel sounds. Extremities: No clubbing cyanosis or edema with positive pedal pulses. Neuro: Alert, awake, oriented x3, nonfocal.   Medications: Prior to Admission medications   Medication Sig Start Date End Date Taking? Authorizing Provider  allopurinol (ZYLOPRIM) 300 MG tablet Take 1 tablet  (300 mg total) by mouth daily. 02/04/16  Yes [name], MD  amLODipine (NORVASC) 10 MG tablet Take 1 tablet (10 mg total) by mouth daily. 02/04/16  Yes [name], MD  atorvastatin (LIPITOR) 20 MG tablet Take 1 tablet (20 mg total) by mouth daily. 02/04/16  Yes [name], MD  colchicine 0.6 MG tablet Take 2 tabs (1.3m) orally at the onset of a Gout attack, may repeat (0.673m if symptoms persist. 02/04/16  Yes EnArnoldo MoraleMD  diphenhydramine-acetaminophen (TYLENOL PM) 25-500 MG TABS tablet Take 1 tablet by mouth at bedtime as needed (for sleep).   Yes Historical Provider, MD  fluticasone (FLONASE) 50 MCG/ACT nasal spray Place 1 spray into both nostrils daily. 02/04/16  Yes EnArnoldo MoraleMD  metFORMIN (GLUCOPHAGE) 1000 MG tablet Take 1 tablet (1,000 mg total) by mouth 2 (two) times daily with a meal. 02/04/16  Yes EnArnoldo MoraleMD  sodium chloride (OCEAN) 0.65 % SOLN nasal spray Place 1 spray into both nostrils as needed for congestion.   Yes Historical Provider, MD  thiamine (VITAMIN B-1) 100 MG tablet Take 100 mg by mouth daily.   Yes Historical Provider, MD  traMADol (ULTRAM) 50 MG tablet TAKE 1 TABLET BY MOUTH EVERY 8 HOURS AS NEEDED FOR GOUT PAIN 07/15/15  Yes VaLance BoschNP  cetirizine (ZYRTEC) 10 MG tablet Take 1 tablet (10 mg total) by mouth daily. Patient not taking: Reported on 02/09/2016 02/04/16   EnArnoldo MoraleMD  glucose monitoring kit (FREESTYLE) monitoring kit 1 each by Does not apply route as  needed for other. 03/13/14   Lance Bosch, NP  Insulin Pen Needle (PEN NEEDLES) 31G X 6 MM MISC Use lantus every night at hours of sleep Patient not taking: Reported on 02/04/2016 03/15/14   Lance Bosch, NP  Lancets (FREESTYLE) lancets Use as instructed 03/13/14   Lance Bosch, NP  omeprazole (PRILOSEC) 20 MG capsule Take 1 capsule (20 mg total) by mouth daily. Patient not taking: Reported on 02/09/2016 05/22/15   Lance Bosch, NP  [facility] ULTRA TEST test strip USE AS DIRECTED FOUR TIMES DAILY  10/21/15   Tresa Garter, MD    Assessment: 1. Probable mild dehydration   Plan: Hydrate-->water and gatorade CMP Out of work today Prilosec DC HCTZ  Follow up:3 days for follow up  The patient was given clear instructions to go to ER or return to medical center if symptoms don't improve, worsen or new problems develop. The patient verbalized understanding. The patient was told to call to get lab results if they haven't heard anything in the next week.   This note has been created with Surveyor, quantity. Any transcriptional errors are unintentional.   Zettie Pho, PA-C 02/09/2016, 10:47 AM

## 2016-02-09 NOTE — Progress Notes (Signed)
Patient verified DOB Patient attested to no allergies to rubbing alcohol or latex

## 2016-02-09 NOTE — Patient Instructions (Signed)
Patient is aware of receiving a FU call regarding results. 

## 2016-02-10 ENCOUNTER — Other Ambulatory Visit: Payer: Self-pay | Admitting: Family Medicine

## 2016-02-10 ENCOUNTER — Telehealth: Payer: Self-pay

## 2016-02-10 DIAGNOSIS — R748 Abnormal levels of other serum enzymes: Secondary | ICD-10-CM

## 2016-02-10 NOTE — Telephone Encounter (Signed)
Per PCP - contacted [phone] to add on the following lab:  Acute Hep B panel and Hep B Surface ab Gamma GT AFP Tumor Maker  I was advsd by Taffy/CSR that she does not know for sure if the lab has enough blood(volume) to process test add ons. She will process request and stated we would receive a call, tomorrow, Wednesday, 02/11/16, if there is not enough blood to process add ons.

## 2016-02-11 ENCOUNTER — Telehealth: Payer: Self-pay

## 2016-02-11 LAB — GAMMA GT: GGT: 664 U/L — ABNORMAL HIGH (ref 7–51)

## 2016-02-11 LAB — AFP TUMOR MARKER: AFP-Tumor Marker: 3.2 ng/mL (ref <6.1)

## 2016-02-11 LAB — HEPATITIS PANEL, ACUTE
HCV Ab: NEGATIVE
Hep A IgM: NONREACTIVE
Hep B C IgM: NONREACTIVE
Hepatitis B Surface Ag: NEGATIVE

## 2016-02-11 LAB — HEPATITIS B SURFACE ANTIBODY, QUANTITATIVE: Hepatitis B-Post: <5 m[IU]/mL

## 2016-02-11 NOTE — Telephone Encounter (Signed)
Writer called patient per Dr. Jarold Song and LVM requesting a call back to discuss his blood test results.

## 2016-02-11 NOTE — Telephone Encounter (Signed)
Contacted pt to go over lab results pt did not answer lvm for pt to give me a call back at his earliest convenience

## 2016-02-11 NOTE — Telephone Encounter (Signed)
-----   Message from [name], MD sent at 02/11/2016 12:43 PM EDT ----- Elevated liver enzymes for which I have ordered additional studies to evaluate for possible causes. If he consumes alcohol it is advisable to quit as it could also explain this elevation. Triglycerides are elevated, advised to commence over-the-counter Fish oil pills and be more compliant with Lipitor.

## 2016-02-12 ENCOUNTER — Encounter: Payer: Self-pay | Admitting: Family Medicine

## 2016-02-12 ENCOUNTER — Ambulatory Visit: Payer: BLUE CROSS/BLUE SHIELD | Attending: Family Medicine | Admitting: Family Medicine

## 2016-02-12 DIAGNOSIS — K219 Gastro-esophageal reflux disease without esophagitis: Secondary | ICD-10-CM | POA: Insufficient documentation

## 2016-02-12 DIAGNOSIS — Z79899 Other long term (current) drug therapy: Secondary | ICD-10-CM | POA: Diagnosis not present

## 2016-02-12 DIAGNOSIS — E119 Type 2 diabetes mellitus without complications: Secondary | ICD-10-CM | POA: Insufficient documentation

## 2016-02-12 DIAGNOSIS — K30 Functional dyspepsia: Secondary | ICD-10-CM

## 2016-02-12 DIAGNOSIS — E781 Pure hyperglyceridemia: Secondary | ICD-10-CM | POA: Insufficient documentation

## 2016-02-12 DIAGNOSIS — M109 Gout, unspecified: Secondary | ICD-10-CM | POA: Diagnosis not present

## 2016-02-12 DIAGNOSIS — R1013 Epigastric pain: Secondary | ICD-10-CM | POA: Diagnosis not present

## 2016-02-12 DIAGNOSIS — R748 Abnormal levels of other serum enzymes: Secondary | ICD-10-CM | POA: Diagnosis not present

## 2016-02-12 DIAGNOSIS — Z7984 Long term (current) use of oral hypoglycemic drugs: Secondary | ICD-10-CM | POA: Insufficient documentation

## 2016-02-12 DIAGNOSIS — F1010 Alcohol abuse, uncomplicated: Secondary | ICD-10-CM | POA: Diagnosis not present

## 2016-02-12 DIAGNOSIS — F17210 Nicotine dependence, cigarettes, uncomplicated: Secondary | ICD-10-CM | POA: Insufficient documentation

## 2016-02-12 DIAGNOSIS — I10 Essential (primary) hypertension: Secondary | ICD-10-CM | POA: Diagnosis not present

## 2016-02-12 DIAGNOSIS — Z794 Long term (current) use of insulin: Secondary | ICD-10-CM | POA: Diagnosis not present

## 2016-02-12 DIAGNOSIS — M1990 Unspecified osteoarthritis, unspecified site: Secondary | ICD-10-CM | POA: Diagnosis not present

## 2016-02-12 MED ORDER — OMEPRAZOLE 20 MG PO CPDR: 20.0000 mg | DELAYED_RELEASE_CAPSULE | Freq: Every day | ORAL | 3 refills | Status: DC

## 2016-02-12 NOTE — Progress Notes (Signed)
CC: Follow-up visit  HPI: [name] is a 60 y.o. male here today for a follow up visit.  Medical history is significant for type 2 diabetes mellitus (A1c 5.4), GERD, Gout . His most recent labs revealed elevated liver enzymes and elevated GGT and he endorses drinking a significant amount of alcohol . Patient has No headache, No chest pain, No abdominal pain - No Nausea, No new weakness tingling or numbness, No Cough - SOB. Requesting refill of Prilosec  Allergies  Allergen Reactions  . Ace Inhibitors Swelling    Angioedema   . Lisinopril Swelling   Past Medical History:  Diagnosis Date  . Acid reflux   . Arthritis   . Diabetes mellitus   . Gout   . Hyperlipidemia   . Hypertension   . Incarcerated ventral hernia   . Mouth bleeding    upper teeth right back side loose and bleed at night   Current Outpatient Prescriptions on File Prior to Visit  Medication Sig Dispense Refill  . allopurinol (ZYLOPRIM) 300 MG tablet Take 1 tablet (300 mg total) by mouth daily. 30 tablet 3  . amLODipine (NORVASC) 10 MG tablet Take 1 tablet (10 mg total) by mouth daily. 30 tablet 3  . atorvastatin (LIPITOR) 20 MG tablet Take 1 tablet (20 mg total) by mouth daily. 30 tablet 3  . cetirizine (ZYRTEC) 10 MG tablet Take 1 tablet (10 mg total) by mouth daily. 30 tablet 3  . colchicine 0.6 MG tablet Take 2 tabs (1.55m) orally at the onset of a Gout attack, may repeat (0.640m if symptoms persist. 20 tablet 1  . diphenhydramine-acetaminophen (TYLENOL PM) 25-500 MG TABS tablet Take 1 tablet by mouth at bedtime as needed (for sleep).    . Marland Kitchenlucose monitoring kit (FREESTYLE) monitoring kit 1 each by Does not apply route as needed for other. 1 each 0  . Insulin Pen Needle (PEN NEEDLES) 31G X 6 MM MISC Use lantus every night at hours of sleep 50 each 3  . Lancets (FREESTYLE) lancets Use as instructed 100 each 12  . metFORMIN (GLUCOPHAGE) 1000 MG tablet Take 1 tablet (1,000 mg total) by mouth 2 (two) times  daily with a meal. 60 tablet 3  . ONE TOUCH ULTRA TEST test strip USE AS DIRECTED FOUR TIMES DAILY 100 each 12  . thiamine (VITAMIN B-1) 100 MG tablet Take 100 mg by mouth daily.    . traMADol (ULTRAM) 50 MG tablet TAKE 1 TABLET BY MOUTH EVERY 8 HOURS AS NEEDED FOR GOUT PAIN 30 tablet 0  . fluticasone (FLONASE) 50 MCG/ACT nasal spray Place 1 spray into both nostrils daily. (Patient not taking: Reported on 02/12/2016) 16 g 0  . sodium chloride (OCEAN) 0.65 % SOLN nasal spray Place 1 spray into both nostrils as needed for congestion.     No current facility-administered medications on file prior to visit.    Family History  Problem Relation Age of Onset  . Diabetes Mother   . Cancer Father   . Multiple sclerosis Sister   . Heart failure Brother    Social History   Social History  . Marital status: Single    Spouse name: N/A  . Number of children: N/A  . Years of education: N/A   Occupational History  . Not on file.   Social History Main Topics  . Smoking status: Current Every Day Smoker    Packs/day: 0.50    Types: Cigarettes  . Smokeless tobacco: Never Used  . Alcohol  use 4.2 - 6.0 oz/week    6 - 8 Shots of liquor, 1 - 2 Cans of beer per week     Comment: weekends only  . Drug use: No  . Sexual activity: No   Other Topics Concern  . Not on file   Social History Narrative  . No narrative on file    Review of Systems: Constitutional: Negative for fever, chills, diaphoresis, activity change, appetite change and fatigue. HENT: Negative for ear pain, nosebleeds, congestion, facial swelling, rhinorrhea, neck pain, neck stiffness and ear discharge.  Eyes: Negative for pain, discharge, redness, itching and visual disturbance. Respiratory: Negative for cough, choking, chest tightness, shortness of breath, wheezing and stridor.  Cardiovascular: Negative for chest pain, palpitations and leg swelling. Gastrointestinal: Negative for abdominal distention. Genitourinary: Negative  for dysuria, urgency, frequency, hematuria, flank pain, decreased urine volume, difficulty urinating and dyspareunia.  Musculoskeletal: Negative for back pain, joint swelling, arthralgias and gait problem. Neurological: Negative for dizziness, tremors, seizures, syncope, facial asymmetry, speech difficulty, weakness, light-headedness, numbness and headaches.  Hematological: Negative for adenopathy. Does not bruise/bleed easily. Psychiatric/Behavioral: Negative for hallucinations, behavioral problems, confusion, dysphoric mood, decreased concentration and agitation.    Objective:   Vitals:   02/12/16 0945  BP: 113/68  Pulse: 91  Temp: 97.9 F (36.6 C)    Physical Exam: Constitutional: Patient appears well-developed and well-nourished. No distress. HENT: Normocephalic, atraumatic, External right and left ear normal. Oropharynx is clear and moist.  Eyes: Conjunctivae and EOM are normal. PERRLA, no scleral icterus. Neck: Normal ROM. Neck supple. No JVD. No tracheal deviation. No thyromegaly. CVS: RRR, S1/S2 +, no murmurs, no gallops, no carotid bruit.  Pulmonary: Effort and breath sounds normal, no stridor, rhonchi, wheezes, rales.  Abdominal: Soft. BS +,  no distension, tenderness, rebound or guarding.  Musculoskeletal: Normal range of motion. No edema and no tenderness.  Lymphadenopathy: No lymphadenopathy noted, cervical, inguinal or axillary Neuro: Alert. Normal reflexes, muscle tone coordination. No cranial nerve deficit. Skin: Skin is warm and dry. No rash noted. Not diaphoretic. No erythema. No pallor. Psychiatric: Normal mood and affect. Behavior, judgment, thought content normal.  Lab Results  Component Value Date   WBC 5.2 11/18/2015   HGB 12.4 (L) 11/18/2015   HCT 36.4 (L) 11/18/2015   MCV 93.6 11/18/2015   PLT 239 11/18/2015   Lab Results  Component Value Date   CREATININE 1.17 02/09/2016   BUN 21 02/09/2016   NA 133 (L) 02/09/2016   K 3.5 02/09/2016   CL 96 (L)  02/09/2016   CO2 21 02/09/2016    Lab Results  Component Value Date   HGBA1C 5.4 02/04/2016   Lipid Panel     Component Value Date/Time   CHOL 131 02/09/2016 0949   TRIG 542 (H) 02/09/2016 0949   HDL 31 (L) 02/09/2016 0949   CHOLHDL 4.2 02/09/2016 0949   VLDL NOT CALC 02/09/2016 0949   LDLCALC NOT CALC 02/09/2016 0949       CMP Latest Ref Rng & Units 02/09/2016 02/09/2016 11/18/2015  Glucose 65 - 99 mg/dL 152(H) 153(H) 97  BUN 7 - 25 mg/dL 21 21 26(H)  Creatinine 0.70 - 1.25 mg/dL 1.17 1.22 1.21  Sodium 135 - 146 mmol/L 133(L) 132(L) 139  Potassium 3.5 - 5.3 mmol/L 3.5 3.5 3.9  Chloride 98 - 110 mmol/L 96(L) 96(L) 107  CO2 20 - 31 mmol/L _0 Calcium 8.6 - 10.3 mg/dL 8.7 8.7 8.7(L)  Total Protein 6.1 - 8.1 g/dL -  7.9 7.6  Total Bilirubin 0.2 - 1.2 mg/dL - 1.8(H) 0.5  Alkaline Phos 40 - 115 U/L - 142(H) 86  AST 10 - 35 U/L - 403(H) 32  ALT 9 - 46 U/L - 212(H) 29    Assessment and plan:  Elevated liver enzyme: Secondary to alcohol abuse Counseled on cessation of alcohol and complications discussed with the patient but he is not ready to quit at this time.  GERD: Refilled Prilosec  Hypertriglyceridemia: Advised to continue Lipitor and add over-the-counter Fish ALSO.     This note has been created with Surveyor, quantity. Any transcriptional errors are unintentional.     [name], MD. [facility] [phone] 02/12/2016, 10:24 AM

## 2016-02-12 NOTE — Patient Instructions (Signed)

## 2016-02-12 NOTE — Progress Notes (Signed)
Admits to drinking alcohol- 1 pint 2 times weekly Is taking OTC prevacid- one daily- never got prilosec almost out of prilosec

## 2016-02-17 ENCOUNTER — Telehealth: Payer: Self-pay | Admitting: Family Medicine

## 2016-02-17 NOTE — Telephone Encounter (Signed)
Pt. Called stating that he has blisters on both of her hand and he thinks is b/c of cetirizine (ZYRTEC) 10 MG tablet. Pt. Stated that the blisters started on Sunday. Pt. Did not take the medication this morning. Please f/u with pt.

## 2016-02-18 NOTE — Telephone Encounter (Signed)
Ok to discontinue cetirizine; if symptoms worsen he will need an office or urgent care visit

## 2016-02-19 NOTE — Telephone Encounter (Signed)
Writer called patient who stated that he stopped taking the mediation two days ago.  He continues to have blisters on his hands and in between his fingers but he states it hasn't worsened.  Patient encouraged to go to Urgent Care or call [facility] for an appt.  Patient stated understanding and will not take the Zyrtec.

## 2016-02-26 ENCOUNTER — Telehealth: Payer: Self-pay | Admitting: *Deleted

## 2016-02-26 NOTE — Telephone Encounter (Signed)
Patient verified DOB Patient states he works in [employer] and will be back in town for a few appointments in September. Patient states he will come by September 18th and provide a urine sample for Micro/Albumin. No further questions at this time.

## 2016-02-27 ENCOUNTER — Other Ambulatory Visit: Payer: Self-pay | Admitting: Internal Medicine

## 2016-02-27 DIAGNOSIS — M1A9XX0 Chronic gout, unspecified, without tophus (tophi): Secondary | ICD-10-CM

## 2016-02-27 DIAGNOSIS — I10 Essential (primary) hypertension: Secondary | ICD-10-CM

## 2016-04-21 ENCOUNTER — Other Ambulatory Visit: Payer: Self-pay | Admitting: Internal Medicine

## 2016-04-21 DIAGNOSIS — E119 Type 2 diabetes mellitus without complications: Secondary | ICD-10-CM

## 2016-05-06 ENCOUNTER — Other Ambulatory Visit: Payer: Self-pay | Admitting: Family Medicine

## 2016-05-06 NOTE — Telephone Encounter (Signed)
Could you please check with his pharmacy to see what is covered? Thank you.

## 2016-05-06 NOTE — Telephone Encounter (Signed)
Pt calling stating his insurance, [employer], will not cover his diabetic machine  Pt states he needs a new machine that will be covered as well as a refill on his test strips

## 2016-05-11 NOTE — Telephone Encounter (Signed)
Patient called wanting to know if a diabetic machine, that's covered by his insurance is covered. Please follow up

## 2016-05-17 MED ORDER — GLUCOSE BLOOD VI STRP: ORAL_STRIP | 12 refills | Status: DC

## 2016-05-17 MED ORDER — BAYER CONTOUR NEXT MONITOR W/DEVICE KIT: PACK | 0 refills | Status: DC

## 2016-05-17 NOTE — Telephone Encounter (Signed)
I believe [name] is covered by commercial [employer]. I have ordered that to replace current supplies.

## 2016-05-18 ENCOUNTER — Telehealth: Payer: Self-pay

## 2016-05-18 NOTE — Telephone Encounter (Signed)
Writer called and LVM informing patient that his supplies are ready for pick up.

## 2016-05-21 ENCOUNTER — Ambulatory Visit: Payer: BLUE CROSS/BLUE SHIELD | Attending: Family Medicine | Admitting: Family Medicine

## 2016-05-21 ENCOUNTER — Encounter: Payer: Self-pay | Admitting: Family Medicine

## 2016-05-21 VITALS — BP 133/73 | HR 86 | Temp 98.4°F | Ht 74.0 in | Wt 240.4 lb

## 2016-05-21 DIAGNOSIS — R05 Cough: Secondary | ICD-10-CM | POA: Diagnosis present

## 2016-05-21 DIAGNOSIS — K219 Gastro-esophageal reflux disease without esophagitis: Secondary | ICD-10-CM | POA: Diagnosis not present

## 2016-05-21 DIAGNOSIS — E1149 Type 2 diabetes mellitus with other diabetic neurological complication: Secondary | ICD-10-CM

## 2016-05-21 DIAGNOSIS — K625 Hemorrhage of anus and rectum: Secondary | ICD-10-CM | POA: Insufficient documentation

## 2016-05-21 DIAGNOSIS — E781 Pure hyperglyceridemia: Secondary | ICD-10-CM | POA: Diagnosis not present

## 2016-05-21 DIAGNOSIS — Z794 Long term (current) use of insulin: Secondary | ICD-10-CM | POA: Insufficient documentation

## 2016-05-21 DIAGNOSIS — M1A9XX0 Chronic gout, unspecified, without tophus (tophi): Secondary | ICD-10-CM | POA: Insufficient documentation

## 2016-05-21 DIAGNOSIS — K648 Other hemorrhoids: Secondary | ICD-10-CM

## 2016-05-21 DIAGNOSIS — Z23 Encounter for immunization: Secondary | ICD-10-CM

## 2016-05-21 DIAGNOSIS — I10 Essential (primary) hypertension: Secondary | ICD-10-CM | POA: Diagnosis not present

## 2016-05-21 DIAGNOSIS — M1990 Unspecified osteoarthritis, unspecified site: Secondary | ICD-10-CM | POA: Insufficient documentation

## 2016-05-21 DIAGNOSIS — Z720 Tobacco use: Secondary | ICD-10-CM | POA: Diagnosis not present

## 2016-05-21 DIAGNOSIS — F1010 Alcohol abuse, uncomplicated: Secondary | ICD-10-CM

## 2016-05-21 DIAGNOSIS — E1140 Type 2 diabetes mellitus with diabetic neuropathy, unspecified: Secondary | ICD-10-CM | POA: Insufficient documentation

## 2016-05-21 DIAGNOSIS — R0981 Nasal congestion: Secondary | ICD-10-CM

## 2016-05-21 DIAGNOSIS — E7800 Pure hypercholesterolemia, unspecified: Secondary | ICD-10-CM

## 2016-05-21 DIAGNOSIS — E119 Type 2 diabetes mellitus without complications: Secondary | ICD-10-CM

## 2016-05-21 DIAGNOSIS — F17200 Nicotine dependence, unspecified, uncomplicated: Secondary | ICD-10-CM | POA: Insufficient documentation

## 2016-05-21 DIAGNOSIS — K30 Functional dyspepsia: Secondary | ICD-10-CM

## 2016-05-21 LAB — GLUCOSE, POCT (MANUAL RESULT ENTRY): POC Glucose: 137 mg/dl — AB (ref 70–99)

## 2016-05-21 LAB — POCT GLYCOSYLATED HEMOGLOBIN (HGB A1C): Hemoglobin A1C: 5.3

## 2016-05-21 MED ORDER — METFORMIN HCL 1000 MG PO TABS: ORAL_TABLET | ORAL | 2 refills | Status: DC

## 2016-05-21 MED ORDER — GABAPENTIN 300 MG PO CAPS: 300.0000 mg | ORAL_CAPSULE | Freq: Two times a day (BID) | ORAL | 3 refills | Status: DC

## 2016-05-21 MED ORDER — ATORVASTATIN CALCIUM 20 MG PO TABS: 20.0000 mg | ORAL_TABLET | Freq: Every day | ORAL | 3 refills | Status: DC

## 2016-05-21 MED ORDER — AMLODIPINE BESYLATE 10 MG PO TABS: 10.0000 mg | ORAL_TABLET | Freq: Every day | ORAL | 3 refills | Status: DC

## 2016-05-21 MED ORDER — FLUTICASONE PROPIONATE 50 MCG/ACT NA SUSP: 1.0000 | Freq: Every day | NASAL | 0 refills | Status: DC

## 2016-05-21 MED ORDER — CETIRIZINE HCL 10 MG PO TABS: 10.0000 mg | ORAL_TABLET | Freq: Every day | ORAL | 3 refills | Status: DC

## 2016-05-21 MED ORDER — OMEPRAZOLE 20 MG PO CPDR: 20.0000 mg | DELAYED_RELEASE_CAPSULE | Freq: Every day | ORAL | 3 refills | Status: DC

## 2016-05-21 MED ORDER — COLCHICINE 0.6 MG PO TABS: ORAL_TABLET | ORAL | 1 refills | Status: DC

## 2016-05-21 MED ORDER — ALLOPURINOL 300 MG PO TABS: 300.0000 mg | ORAL_TABLET | Freq: Every day | ORAL | 3 refills | Status: DC
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
## 2016-05-21 NOTE — Progress Notes (Signed)
5

## 2016-05-24 ENCOUNTER — Other Ambulatory Visit: Payer: Self-pay | Admitting: Pharmacist

## 2016-05-24 MED ORDER — COLCRYS 0.6 MG PO TABS: ORAL_TABLET | ORAL | 0 refills | Status: DC

## 2016-07-22 ENCOUNTER — Other Ambulatory Visit: Payer: Self-pay | Admitting: Oncology

## 2016-07-22 ENCOUNTER — Emergency Department (HOSPITAL_COMMUNITY): Payer: BLUE CROSS/BLUE SHIELD

## 2016-07-22 ENCOUNTER — Emergency Department (HOSPITAL_COMMUNITY)
Admission: EM | Admit: 2016-07-22 | Discharge: 2016-07-22 | Disposition: A | Discharge status: Home / Self Care | Payer: BLUE CROSS/BLUE SHIELD | Attending: Emergency Medicine | Admitting: Emergency Medicine

## 2016-07-22 ENCOUNTER — Encounter (HOSPITAL_COMMUNITY): Payer: Self-pay | Admitting: Emergency Medicine

## 2016-07-22 DIAGNOSIS — R918 Other nonspecific abnormal finding of lung field: Secondary | ICD-10-CM | POA: Diagnosis not present

## 2016-07-22 DIAGNOSIS — I10 Essential (primary) hypertension: Secondary | ICD-10-CM | POA: Diagnosis not present

## 2016-07-22 DIAGNOSIS — R079 Chest pain, unspecified: Secondary | ICD-10-CM | POA: Diagnosis present

## 2016-07-22 DIAGNOSIS — Z794 Long term (current) use of insulin: Secondary | ICD-10-CM | POA: Diagnosis not present

## 2016-07-22 DIAGNOSIS — F17210 Nicotine dependence, cigarettes, uncomplicated: Secondary | ICD-10-CM | POA: Diagnosis not present

## 2016-07-22 DIAGNOSIS — E1140 Type 2 diabetes mellitus with diabetic neuropathy, unspecified: Secondary | ICD-10-CM | POA: Insufficient documentation

## 2016-07-22 LAB — CBC
HCT: 37.1 % — ABNORMAL LOW (ref 39.0–52.0)
Hemoglobin: 12.6 g/dL — ABNORMAL LOW (ref 13.0–17.0)
MCH: 29.6 pg (ref 26.0–34.0)
MCHC: 34 g/dL (ref 30.0–36.0)
MCV: 87.1 fL (ref 78.0–100.0)
Platelets: 241 10*3/uL (ref 150–400)
RBC: 4.26 MIL/uL (ref 4.22–5.81)
RDW: 16.5 % — ABNORMAL HIGH (ref 11.5–15.5)
WBC: 9.7 10*3/uL (ref 4.0–10.5)

## 2016-07-22 LAB — BASIC METABOLIC PANEL
Anion gap: 9 (ref 5–15)
BUN: 16 mg/dL (ref 6–20)
CO2: 23 mmol/L (ref 22–32)
Calcium: 9 mg/dL (ref 8.9–10.3)
Chloride: 104 mmol/L (ref 101–111)
Creatinine, Ser: 0.78 mg/dL (ref 0.61–1.24)
GFR calc Af Amer: >60 mL/min (ref >60)
GFR calc non Af Amer: >60 mL/min (ref >60)
Glucose, Bld: 139 mg/dL — ABNORMAL HIGH (ref 65–99)
Potassium: 3.5 mmol/L (ref 3.5–5.1)
Sodium: 136 mmol/L (ref 135–145)

## 2016-07-22 LAB — I-STAT TROPONIN, ED: Troponin i, poc: 0.01 ng/mL (ref 0.00–0.08)

## 2016-07-22 MED ORDER — NAPROXEN 500 MG PO TABS: 500.0000 mg | ORAL_TABLET | Freq: Two times a day (BID) | ORAL | 0 refills | Status: DC

## 2016-07-22 MED ORDER — IOPAMIDOL (ISOVUE-370) INJECTION 76%
INTRAVENOUS | Status: AC
  Administered 2016-07-22: 09:00:00
  Filled 2016-07-22: qty 100

## 2016-07-22 MED ORDER — IOPAMIDOL (ISOVUE-370) INJECTION 76%
100.0000 mL | Freq: Once | INTRAVENOUS | Status: AC | PRN
  Administered 2016-07-22: 100 mL via INTRAVENOUS

## 2016-07-22 MED ORDER — HYDROCODONE-ACETAMINOPHEN 5-325 MG PO TABS
1.0000 | ORAL_TABLET | Freq: Once | ORAL | Status: AC
  Administered 2016-07-22: 1 via ORAL
  Filled 2016-07-22: qty 1

## 2016-07-22 MED ORDER — TRAMADOL HCL 50 MG PO TABS: 100.0000 mg | ORAL_TABLET | Freq: Four times a day (QID) | ORAL | 0 refills | Status: DC | PRN

## 2016-07-22 MED ORDER — NAPROXEN 500 MG PO TABS
500.0000 mg | ORAL_TABLET | Freq: Once | ORAL | Status: AC
  Administered 2016-07-22: 500 mg via ORAL
  Filled 2016-07-22: qty 1
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
## 2016-07-22 NOTE — ED Triage Notes (Signed)
Pt reports L shoulder pain that radiates across chest since yesterday. Some feeling of SOB, pain worse with deep breath. No recent injuries. No dizziness, light headedness, or nausea.

## 2016-07-22 NOTE — Discharge Instructions (Signed)
You have a lung mass on the right side - we have concerns that this is lung cancer. We advice you to see a cancer doctor immediately for further recommendations.

## 2016-07-26 ENCOUNTER — Telehealth: Payer: Self-pay | Admitting: Internal Medicine

## 2016-07-26 ENCOUNTER — Ambulatory Visit: Payer: BLUE CROSS/BLUE SHIELD | Attending: Internal Medicine | Admitting: Physician Assistant

## 2016-07-26 ENCOUNTER — Encounter: Payer: Self-pay | Admitting: Internal Medicine

## 2016-07-26 ENCOUNTER — Encounter: Payer: Self-pay | Admitting: *Deleted

## 2016-07-26 VITALS — BP 126/74 | HR 83 | Temp 97.9°F | Resp 18 | Ht 74.0 in | Wt 229.6 lb

## 2016-07-26 DIAGNOSIS — I10 Essential (primary) hypertension: Secondary | ICD-10-CM | POA: Insufficient documentation

## 2016-07-26 DIAGNOSIS — F17200 Nicotine dependence, unspecified, uncomplicated: Secondary | ICD-10-CM

## 2016-07-26 DIAGNOSIS — IMO0002 Reserved for concepts with insufficient information to code with codable children: Secondary | ICD-10-CM

## 2016-07-26 DIAGNOSIS — K219 Gastro-esophageal reflux disease without esophagitis: Secondary | ICD-10-CM | POA: Insufficient documentation

## 2016-07-26 DIAGNOSIS — E108 Type 1 diabetes mellitus with unspecified complications: Secondary | ICD-10-CM

## 2016-07-26 DIAGNOSIS — E119 Type 2 diabetes mellitus without complications: Secondary | ICD-10-CM | POA: Diagnosis not present

## 2016-07-26 DIAGNOSIS — E785 Hyperlipidemia, unspecified: Secondary | ICD-10-CM | POA: Diagnosis not present

## 2016-07-26 DIAGNOSIS — M109 Gout, unspecified: Secondary | ICD-10-CM | POA: Diagnosis not present

## 2016-07-26 DIAGNOSIS — F17210 Nicotine dependence, cigarettes, uncomplicated: Secondary | ICD-10-CM | POA: Insufficient documentation

## 2016-07-26 DIAGNOSIS — Z794 Long term (current) use of insulin: Secondary | ICD-10-CM | POA: Insufficient documentation

## 2016-07-26 DIAGNOSIS — R079 Chest pain, unspecified: Secondary | ICD-10-CM | POA: Insufficient documentation

## 2016-07-26 DIAGNOSIS — E1065 Type 1 diabetes mellitus with hyperglycemia: Secondary | ICD-10-CM | POA: Diagnosis not present

## 2016-07-26 DIAGNOSIS — R918 Other nonspecific abnormal finding of lung field: Secondary | ICD-10-CM | POA: Insufficient documentation

## 2016-07-26 LAB — GLUCOSE, POCT (MANUAL RESULT ENTRY): POC Glucose: 152 mg/dl — AB (ref 70–99)

## 2016-07-26 MED ORDER — VARENICLINE TARTRATE 0.5 MG PO TABS: 0.5000 mg | ORAL_TABLET | Freq: Two times a day (BID) | ORAL | 0 refills | Status: DC

## 2016-07-26 NOTE — Progress Notes (Signed)
Chief Complaint: "I was in the Emergency room and got bad news"  Subjective: This is a pleasant 61 year old male with a history of diabetes mellitus type 2, hypertension, hyperlipidemia, gouty arthritis, reflux and continues to smoke. He presented to the emergency department on 07/22/2016 with complaints of chest pain for 2 days. It was pleuritic in description. It also was positional. He described a sharp anterior chest pain. His vital signs were stable. His glucose was 139. His hemoglobin was 12.6. His troponin was negative. His EKG was nonrevealing. His chest x-ray showed a new right lower lobe mass with right hilar lymphadenopathy. He went for chest CT which showed no evidence of PE but confirmed right lower lobe mass and mentioned it being suspicious for cancer. He was treated with anti-inflammatories for the CP and told to follow-up here for further recommendations.  He states this chest pain has subsided. His breathing is okay. He continues to smoke one half pack per day of cigarettes unfortunately but interested in cessation. He is working.   ROS:  GEN: denies fever or chills, denies change in weight Skin: denies lesions or rashes HEENT: denies headache, earache, epistaxis, sore throat, or neck pain LUNGS: denies SHOB, dyspnea, PND, orthopnea CV: denies CP or palpitations ABD: denies abd pain, N or V EXT: denies muscle spasms or swelling; no pain in lower ext, no weakness NEURO: denies numbness or tingling, denies sz, stroke or TIA   Objective:  Vitals:   07/26/16 1120  BP: 126/74  Pulse: 83  Resp: 18  Temp: 97.9 F (36.6 C)  TempSrc: Oral  SpO2: 98%  Weight: 229 lb 9.6 oz (104.1 kg)  Height: 6' 2" (1.88 m)    Physical Exam:benign  General: in no acute distress. HEENT: no pallor, no icterus, moist oral mucosa, no JVD, no lymphadenopathy Heart: Normal  s1 &s2  Regular rate and rhythm, without murmurs, rubs, gallops. Lungs: Clear to auscultation bilaterally. Abdomen:  Soft, nontender, nondistended, positive bowel sounds. Extremities: No clubbing cyanosis or edema with positive pedal pulses. Neuro: Alert, awake, oriented x3, nonfocal.  Pertinent Lab Results:none   Medications: Prior to Admission medications   Medication Sig Start Date End Date Taking? Authorizing Provider  allopurinol (ZYLOPRIM) 300 MG tablet Take 1 tablet (300 mg total) by mouth daily. 05/21/16  Yes [name], MD  amLODipine (NORVASC) 10 MG tablet Take 1 tablet (10 mg total) by mouth daily. 05/21/16  Yes [name], MD  atorvastatin (LIPITOR) 20 MG tablet Take 1 tablet (20 mg total) by mouth daily. 05/21/16  Yes [name], MD  Blood Glucose Monitoring Suppl (BAYER CONTOUR NEXT MONITOR) w/Device KIT Use as directed 05/17/16  Yes Olugbemiga E Doreene Burke, MD  cetirizine (ZYRTEC) 10 MG tablet Take 1 tablet (10 mg total) by mouth daily. 05/21/16  Yes [name], MD  COLCRYS 0.6 MG tablet Take 2 tabs (1.64m) orally at the onset of a Gout attack, may repeat (0.648m if symptoms persist. Patient taking differently: Take 0.6 mg by mouth daily.  05/24/16  Yes EnArnoldo MoraleMD  diphenhydramine-acetaminophen (TYLENOL PM) 25-500 MG TABS tablet Take 1 tablet by mouth at bedtime as needed (for sleep and for mild pain).    Yes Historical Provider, MD  fluticasone (FLONASE) 50 MCG/ACT nasal spray Place 1 spray into both nostrils daily. 05/21/16  Yes EnArnoldo MoraleMD  gabapentin (NEURONTIN) 300 MG capsule Take 1 capsule (300 mg total) by mouth 2 (two) times daily. 05/21/16  Yes EnArnoldo MoraleMD  glucose blood (BAYER CONTOUR NEXT  TEST) test strip Use as instructed 05/17/16  Yes Olugbemiga E Doreene Burke, MD  ibuprofen (ADVIL,MOTRIN) 200 MG tablet Take 400 mg by mouth every 6 (six) hours as needed for mild pain.   Yes Historical Provider, MD  Insulin Pen Needle (PEN NEEDLES) 31G X 6 MM MISC Use lantus every night at hours of sleep 03/15/14  Yes Lance Bosch, NP  metFORMIN (GLUCOPHAGE) 1000 MG tablet TAKE 1  TABLET(1000 MG) BY MOUTH TWICE DAILY WITH A MEAL 05/21/16  Yes [name], MD  naproxen (NAPROSYN) 500 MG tablet Take 1 tablet (500 mg total) by mouth 2 (two) times daily with a meal. 07/22/16  Yes Varney Biles, MD  Omega-3 Fatty Acids (FISH OIL) 1000 MG CAPS Take 1 capsule by mouth daily.   Yes Historical Provider, MD  omeprazole (PRILOSEC) 20 MG capsule Take 1 capsule (20 mg total) by mouth daily. 05/21/16  Yes [name], MD  sodium chloride (OCEAN) 0.65 % SOLN nasal spray Place 1 spray into both nostrils every 2 (two) hours as needed for congestion.    Yes Historical Provider, MD  thiamine (VITAMIN B-1) 100 MG tablet Take 100 mg by mouth daily.   Yes Historical Provider, MD  traMADol (ULTRAM) 50 MG tablet Take 2 tablets (100 mg total) by mouth every 6 (six) hours as needed. 07/22/16  Yes Varney Biles, MD    Assessment: 1. New RLL lung mass 2. Smoker  Plan: Oncology referral [facility] on smoking cessation. Receptive.   Follow up:1 week  The patient was given clear instructions to go to ER or return to medical center if symptoms don't improve, worsen or new problems develop. The patient verbalized understanding. The patient was told to call to get lab results if they haven't heard anything in the next week.   This note has been created with Surveyor, quantity. Any transcriptional errors are unintentional.   Zettie Pho, PA-C 07/26/2016, 11:54 AM

## 2016-07-26 NOTE — Telephone Encounter (Signed)
Appt has been scheduled for the pt to see Dr. Julien Nordmann on 2/6 at 215pm. Pt aware to arrive 30 minutes early. Demographics verified. Letter mailed.

## 2016-07-26 NOTE — Progress Notes (Signed)
Patient is here for HFU chest Pain  Patient denies any pain at this time.  Patient has taken medication today. Patient has eaten today.

## 2016-07-26 NOTE — Progress Notes (Signed)
Oncology Nurse Navigator Documentation  Oncology Nurse Navigator Flowsheets 07/26/2016  Navigator Location [facility]  Referral date to RadOnc/MedOnc 07/26/2016  Navigator Encounter Type Other/I received referral from Dr. Jana Hakim.  Patient has no pathology nor further work up. I notified HIM to call and schedule patient to be seen on 08/10/16 with labs   Treatment Phase Abnormal Scans  Barriers/Navigation Needs Coordination of Care  Interventions Coordination of Care  Acuity Level 2  Acuity Level 2 Assistance expediting appointments  Time Spent with Patient 30

## 2016-08-05 ENCOUNTER — Encounter: Payer: Self-pay | Admitting: Family Medicine

## 2016-08-05 ENCOUNTER — Ambulatory Visit: Payer: BLUE CROSS/BLUE SHIELD | Attending: Family Medicine | Admitting: Family Medicine

## 2016-08-05 VITALS — BP 117/70 | HR 82 | Temp 97.8°F | Ht 74.0 in | Wt 235.8 lb

## 2016-08-05 DIAGNOSIS — F17210 Nicotine dependence, cigarettes, uncomplicated: Secondary | ICD-10-CM | POA: Diagnosis not present

## 2016-08-05 DIAGNOSIS — E781 Pure hyperglyceridemia: Secondary | ICD-10-CM | POA: Diagnosis not present

## 2016-08-05 DIAGNOSIS — Z794 Long term (current) use of insulin: Secondary | ICD-10-CM | POA: Diagnosis not present

## 2016-08-05 DIAGNOSIS — K30 Functional dyspepsia: Secondary | ICD-10-CM | POA: Insufficient documentation

## 2016-08-05 DIAGNOSIS — M1A9XX0 Chronic gout, unspecified, without tophus (tophi): Secondary | ICD-10-CM

## 2016-08-05 DIAGNOSIS — I10 Essential (primary) hypertension: Secondary | ICD-10-CM | POA: Insufficient documentation

## 2016-08-05 DIAGNOSIS — E7800 Pure hypercholesterolemia, unspecified: Secondary | ICD-10-CM | POA: Insufficient documentation

## 2016-08-05 DIAGNOSIS — E1149 Type 2 diabetes mellitus with other diabetic neurological complication: Secondary | ICD-10-CM

## 2016-08-05 DIAGNOSIS — E119 Type 2 diabetes mellitus without complications: Secondary | ICD-10-CM | POA: Diagnosis present

## 2016-08-05 DIAGNOSIS — E1140 Type 2 diabetes mellitus with diabetic neuropathy, unspecified: Secondary | ICD-10-CM | POA: Insufficient documentation

## 2016-08-05 DIAGNOSIS — R0981 Nasal congestion: Secondary | ICD-10-CM | POA: Insufficient documentation

## 2016-08-05 DIAGNOSIS — K219 Gastro-esophageal reflux disease without esophagitis: Secondary | ICD-10-CM | POA: Diagnosis not present

## 2016-08-05 DIAGNOSIS — F1010 Alcohol abuse, uncomplicated: Secondary | ICD-10-CM | POA: Diagnosis not present

## 2016-08-05 LAB — GLUCOSE, POCT (MANUAL RESULT ENTRY): POC Glucose: 101 mg/dl — AB (ref 70–99)

## 2016-08-05 MED ORDER — ATORVASTATIN CALCIUM 20 MG PO TABS: 20.0000 mg | ORAL_TABLET | Freq: Every day | ORAL | 1 refills | Status: DC

## 2016-08-05 MED ORDER — FLUTICASONE PROPIONATE 50 MCG/ACT NA SUSP: 1.0000 | Freq: Every day | NASAL | 0 refills | Status: DC

## 2016-08-05 MED ORDER — AMLODIPINE BESYLATE 10 MG PO TABS: 10.0000 mg | ORAL_TABLET | Freq: Every day | ORAL | 1 refills | Status: DC

## 2016-08-05 MED ORDER — OMEPRAZOLE 20 MG PO CPDR: 20.0000 mg | DELAYED_RELEASE_CAPSULE | Freq: Every day | ORAL | 1 refills | Status: DC

## 2016-08-05 MED ORDER — METFORMIN HCL 1000 MG PO TABS: ORAL_TABLET | ORAL | 1 refills | Status: DC

## 2016-08-05 MED ORDER — ALLOPURINOL 300 MG PO TABS: 300.0000 mg | ORAL_TABLET | Freq: Every day | ORAL | 1 refills | Status: DC

## 2016-08-05 MED ORDER — GABAPENTIN 300 MG PO CAPS: 300.0000 mg | ORAL_CAPSULE | Freq: Two times a day (BID) | ORAL | 1 refills | Status: DC

## 2016-08-05 MED ORDER — CETIRIZINE HCL 10 MG PO TABS
10.0000 mg | ORAL_TABLET | Freq: Every day | ORAL | 1 refills | Status: DC
Start: 2016-08-05 — End: 2017-03-25
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
## 2016-08-05 NOTE — Progress Notes (Signed)
Needs refills on everything

## 2016-08-09 ENCOUNTER — Other Ambulatory Visit: Payer: Self-pay | Admitting: *Deleted

## 2016-08-09 DIAGNOSIS — R918 Other nonspecific abnormal finding of lung field: Secondary | ICD-10-CM

## 2016-08-10 ENCOUNTER — Ambulatory Visit (HOSPITAL_BASED_OUTPATIENT_CLINIC_OR_DEPARTMENT_OTHER): Payer: BLUE CROSS/BLUE SHIELD | Admitting: Internal Medicine

## 2016-08-10 ENCOUNTER — Other Ambulatory Visit (HOSPITAL_BASED_OUTPATIENT_CLINIC_OR_DEPARTMENT_OTHER): Payer: BLUE CROSS/BLUE SHIELD

## 2016-08-10 ENCOUNTER — Other Ambulatory Visit: Payer: Self-pay | Admitting: Medical Oncology

## 2016-08-10 ENCOUNTER — Encounter: Payer: Self-pay | Admitting: *Deleted

## 2016-08-10 ENCOUNTER — Telehealth: Payer: Self-pay | Admitting: Internal Medicine

## 2016-08-10 ENCOUNTER — Encounter: Payer: Self-pay | Admitting: Internal Medicine

## 2016-08-10 VITALS — BP 137/84 | HR 80 | Temp 98.8°F | Resp 19 | Ht 73.0 in | Wt 236.7 lb

## 2016-08-10 DIAGNOSIS — J984 Other disorders of lung: Secondary | ICD-10-CM

## 2016-08-10 DIAGNOSIS — R918 Other nonspecific abnormal finding of lung field: Secondary | ICD-10-CM

## 2016-08-10 DIAGNOSIS — I10 Essential (primary) hypertension: Secondary | ICD-10-CM | POA: Diagnosis not present

## 2016-08-10 DIAGNOSIS — R0602 Shortness of breath: Secondary | ICD-10-CM | POA: Diagnosis not present

## 2016-08-10 DIAGNOSIS — Z716 Tobacco abuse counseling: Secondary | ICD-10-CM | POA: Diagnosis not present

## 2016-08-10 DIAGNOSIS — Z720 Tobacco use: Secondary | ICD-10-CM | POA: Diagnosis not present

## 2016-08-10 HISTORY — DX: Tobacco abuse counseling: Z71.6

## 2016-08-10 LAB — CBC WITH DIFFERENTIAL/PLATELET
BASO%: 1.3 % (ref 0.0–2.0)
Basophils Absolute: 0.1 10*3/uL (ref 0.0–0.1)
EOS%: 4.2 % (ref 0.0–7.0)
Eosinophils Absolute: 0.3 10*3/uL (ref 0.0–0.5)
HCT: 40.4 % (ref 38.4–49.9)
HGB: 13.7 g/dL (ref 13.0–17.1)
LYMPH%: 28 % (ref 14.0–49.0)
MCH: 30.1 pg (ref 27.2–33.4)
MCHC: 33.9 g/dL (ref 32.0–36.0)
MCV: 88.8 fL (ref 79.3–98.0)
MONO#: 0.6 10*3/uL (ref 0.1–0.9)
MONO%: 8.7 % (ref 0.0–14.0)
NEUT#: 4.3 10*3/uL (ref 1.5–6.5)
NEUT%: 57.8 % (ref 39.0–75.0)
Platelets: 246 10*3/uL (ref 140–400)
RBC: 4.55 10*6/uL (ref 4.20–5.82)
RDW: 17.5 % — ABNORMAL HIGH (ref 11.0–14.6)
WBC: 7.5 10*3/uL (ref 4.0–10.3)
lymph#: 2.1 10*3/uL (ref 0.9–3.3)

## 2016-08-10 LAB — COMPREHENSIVE METABOLIC PANEL
ALT: 29 U/L (ref 0–55)
AST: 26 U/L (ref 5–34)
Albumin: 3.8 g/dL (ref 3.5–5.0)
Alkaline Phosphatase: 130 U/L (ref 40–150)
Anion Gap: 10 mEq/L (ref 3–11)
BUN: 14.6 mg/dL (ref 7.0–26.0)
CO2: 23 mEq/L (ref 22–29)
Calcium: 9.9 mg/dL (ref 8.4–10.4)
Chloride: 106 mEq/L (ref 98–109)
Creatinine: 1.3 mg/dL (ref 0.7–1.3)
EGFR: 70 mL/min/{1.73_m2} — ABNORMAL LOW (ref >90)
Glucose: 96 mg/dl (ref 70–140)
Potassium: 4.2 mEq/L (ref 3.5–5.1)
Sodium: 139 mEq/L (ref 136–145)
Total Bilirubin: 0.38 mg/dL (ref 0.20–1.20)
Total Protein: 9.1 g/dL — ABNORMAL HIGH (ref 6.4–8.3)
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
## 2016-08-10 NOTE — Telephone Encounter (Signed)
Appointments scheduled per 08/10/16 los. Patient was given a copy of the AVS report and appointment schedule,per 08/10/16 los.

## 2016-08-10 NOTE — Progress Notes (Signed)
Oncology Nurse Navigator Documentation  Oncology Nurse Navigator Flowsheets 08/10/2016  Navigator Location [facility]  Navigator Encounter Type Clinic/MDC/I met Mr. Peace today at [facility].  He needs MRI brain, PET, and then referral to T surgery.  I notified authorization coordinator to help expedite.   Patient Visit Type MedOnc  Treatment Phase Abnormal Scans  Barriers/Navigation Needs Coordination of Care  Interventions Coordination of Care  Coordination of Care Other;Appts  Acuity Level 2  Time Spent with Patient 30

## 2016-08-11 ENCOUNTER — Telehealth: Payer: Self-pay | Admitting: *Deleted

## 2016-08-11 NOTE — Telephone Encounter (Signed)
Oncology Nurse Navigator Documentation  Oncology Nurse Navigator Flowsheets 08/11/2016  Navigator Location [facility]  Navigator Encounter Type Telephone/patient's scans have been authorized.  I called central scheduling to schedule. I called and spoke with patient and updated him on appt time and place as well as pre-procedure instructions. He asked me to mail him information. I have a map and outlined instructions for patient. I put this in the mail today.   Telephone Outgoing Call  Treatment Phase Abnormal Scans  Barriers/Navigation Needs Coordination of Care  Interventions Coordination of Care  Coordination of Care Appts;Radiology  Acuity Level 2  Time Spent with Patient 45

## 2016-08-11 NOTE — Patient Instructions (Signed)
PET Scan Introduction A PET scan, also called positron emission tomography, is a test that creates pictures of the inside of the body. A PET scan requires a small dose of a harmless radioactive material to be injected into a vein. When this material combines with certain substances in the body, it produces tiny particles that can be detected by a scanner and converted into pictures. The pictures created during a PET scan can be used to study a disease. They are often used to study cancer and cancer therapy. The colors and brightness on the pictures show different levels of organ and tissue function. For example, cancer tissue appears brighter than normal tissue on a PET scan picture. Tell a health care provider about:  Any allergies you have.  All medicines you are taking, including vitamins, herbs, eye drops, creams, and over-the-counter medicines.  Any problems you or family members have had with anesthetic medicines.  Any blood disorders you have.  Any surgeries you have had.  Any medical conditions you have.  If you are afraid of cramped spaces (claustrophobic). If claustrophobia is a problem, it usually can be relieved with mild sedatives or antianxiety medicines.  If you have trouble staying still for long periods of time. What happens before the procedure?  Do not eat or drink anything after midnight on the night before the procedure or as directed by your health care provider.  Take medicines only as directed by your health care provider.  If you have diabetes, ask your health care provider for diet guidelines to control sugar (glucose) levels on the day of the test. What happens during the procedure?  A small amount of radioactive material will be injected into a vein. The test will begin 30-60 minutes after the injection, when the material has traveled around your body.  You will lie on a cushioned table, and the table will be moved through the center of a machine that looks  like a large donut. It will take about 30-60 minutes for the machine to produce pictures of your body. You will need to stay still during this time. What happens after the procedure?  You may resume your normal diet and activities.  Drink several 8 oz glasses of water following the test to flush the radioactive material out of your body. This information is not intended to replace advice given to you by your health care provider. Make sure you discuss any questions you have with your health care provider. Document Released: 12/26/2002 Document Revised: 11/27/2015 Document Reviewed: 10/03/2013  2017 [employer]vier

## 2016-08-20 ENCOUNTER — Telehealth: Payer: Self-pay | Admitting: *Deleted

## 2016-08-20 NOTE — Telephone Encounter (Signed)
Oncology Nurse Navigator Documentation  Oncology Nurse Navigator Flowsheets 08/20/2016  Navigator Location [facility]  Navigator Encounter Type Telephone/I called and spoke with [name] today.  I reminded him of his scan appts and where they were.  He stated he remembered. He was thankful for the call.   Telephone Outgoing Call  Treatment Phase Abnormal Scans  Barriers/Navigation Needs Education  Education Other  Interventions Education  Coordination of Care Other  Acuity Level 1  Time Spent with Patient 15

## 2016-08-23 ENCOUNTER — Encounter (HOSPITAL_COMMUNITY)
Admission: RE | Admit: 2016-08-23 | Discharge: 2016-08-23 | Disposition: A | Discharge status: Home / Self Care | Payer: BLUE CROSS/BLUE SHIELD | Source: Ambulatory Visit | Attending: Internal Medicine | Admitting: Internal Medicine

## 2016-08-23 ENCOUNTER — Ambulatory Visit (HOSPITAL_COMMUNITY)
Admission: RE | Admit: 2016-08-23 | Discharge: 2016-08-23 | Disposition: A | Discharge status: Home / Self Care | Payer: BLUE CROSS/BLUE SHIELD | Source: Ambulatory Visit | Attending: Internal Medicine | Admitting: Internal Medicine

## 2016-08-23 DIAGNOSIS — K5730 Diverticulosis of large intestine without perforation or abscess without bleeding: Secondary | ICD-10-CM | POA: Diagnosis not present

## 2016-08-23 DIAGNOSIS — I700 Atherosclerosis of aorta: Secondary | ICD-10-CM | POA: Diagnosis not present

## 2016-08-23 DIAGNOSIS — K449 Diaphragmatic hernia without obstruction or gangrene: Secondary | ICD-10-CM | POA: Insufficient documentation

## 2016-08-23 DIAGNOSIS — N400 Enlarged prostate without lower urinary tract symptoms: Secondary | ICD-10-CM | POA: Insufficient documentation

## 2016-08-23 DIAGNOSIS — R918 Other nonspecific abnormal finding of lung field: Secondary | ICD-10-CM | POA: Insufficient documentation

## 2016-08-23 DIAGNOSIS — J439 Emphysema, unspecified: Secondary | ICD-10-CM | POA: Insufficient documentation

## 2016-08-23 DIAGNOSIS — N200 Calculus of kidney: Secondary | ICD-10-CM | POA: Diagnosis not present

## 2016-08-23 DIAGNOSIS — N281 Cyst of kidney, acquired: Secondary | ICD-10-CM | POA: Diagnosis not present

## 2016-08-23 LAB — GLUCOSE, CAPILLARY: Glucose-Capillary: 96 mg/dL (ref 65–99)

## 2016-08-23 MED ORDER — GADOBENATE DIMEGLUMINE 529 MG/ML IV SOLN
20.0000 mL | Freq: Once | INTRAVENOUS | Status: AC | PRN
  Administered 2016-08-23: 20 mL via INTRAVENOUS

## 2016-08-23 MED ORDER — FLUDEOXYGLUCOSE F - 18 (FDG) INJECTION
11.6600 | Freq: Once | INTRAVENOUS | Status: AC | PRN
  Administered 2016-08-23: 11.66 via INTRAVENOUS

## 2016-08-24 ENCOUNTER — Encounter: Payer: Self-pay | Admitting: Internal Medicine

## 2016-08-24 ENCOUNTER — Ambulatory Visit (HOSPITAL_BASED_OUTPATIENT_CLINIC_OR_DEPARTMENT_OTHER): Payer: BLUE CROSS/BLUE SHIELD | Admitting: Internal Medicine

## 2016-08-24 ENCOUNTER — Telehealth: Payer: Self-pay | Admitting: Internal Medicine

## 2016-08-24 VITALS — BP 128/86 | HR 86 | Temp 99.0°F | Resp 18 | Ht 74.0 in | Wt 231.0 lb

## 2016-08-24 DIAGNOSIS — F17200 Nicotine dependence, unspecified, uncomplicated: Secondary | ICD-10-CM

## 2016-08-24 DIAGNOSIS — F1010 Alcohol abuse, uncomplicated: Secondary | ICD-10-CM

## 2016-08-24 DIAGNOSIS — R918 Other nonspecific abnormal finding of lung field: Secondary | ICD-10-CM | POA: Diagnosis not present
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
## 2016-08-24 NOTE — Patient Instructions (Signed)
Steps to Quit Smoking Smoking tobacco can be harmful to your health and can affect almost every organ in your body. Smoking puts you, and those around you, at risk for developing many serious chronic diseases. Quitting smoking is difficult, but it is one of the best things that you can do for your health. It is never too late to quit. What are the benefits of quitting smoking? When you quit smoking, you lower your risk of developing serious diseases and conditions, such as:  Lung cancer or lung disease, such as COPD.  Heart disease.  Stroke.  Heart attack.  Infertility.  Osteoporosis and bone fractures.  Additionally, symptoms such as coughing, wheezing, and shortness of breath may get better when you quit. You may also find that you get sick less often because your body is stronger at fighting off colds and infections. If you are pregnant, quitting smoking can help to reduce your chances of having a baby of low birth weight. How do I get ready to quit? When you decide to quit smoking, create a plan to make sure that you are successful. Before you quit:  Pick a date to quit. Set a date within the next two weeks to give you time to prepare.  Write down the reasons why you are quitting. Keep this list in places where you will see it often, such as on your bathroom mirror or in your car or wallet.  Identify the people, places, things, and activities that make you want to smoke (triggers) and avoid them. Make sure to take these actions: ? Throw away all cigarettes at home, at work, and in your car. ? Throw away smoking accessories, such as ashtrays and lighters. ? Clean your car and make sure to empty the ashtray. ? Clean your home, including curtains and carpets.  Tell your family, friends, and coworkers that you are quitting. Support from your loved ones can make quitting easier.  Talk with your health care provider about your options for quitting smoking.  Find out what treatment  options are covered by your health insurance.  What strategies can I use to quit smoking? Talk with your healthcare provider about different strategies to quit smoking. Some strategies include:  Quitting smoking altogether instead of gradually lessening how much you smoke over a period of time. Research shows that quitting "cold turkey" is more successful than gradually quitting.  Attending in-person counseling to help you build problem-solving skills. You are more likely to have success in quitting if you attend several counseling sessions. Even short sessions of 10 minutes can be effective.  Finding resources and support systems that can help you to quit smoking and remain smoke-free after you quit. These resources are most helpful when you use them often. They can include: ? Online chats with a counselor. ? Telephone quitlines. ? Printed self-help materials. ? Support groups or group counseling. ? Text messaging programs. ? Mobile phone applications.  Taking medicines to help you quit smoking. (If you are pregnant or breastfeeding, talk with your health care provider first.) Some medicines contain nicotine and some do not. Both types of medicines help with cravings, but the medicines that include nicotine help to relieve withdrawal symptoms. Your health care provider may recommend: ? Nicotine patches, gum, or lozenges. ? Nicotine inhalers or sprays. ? Non-nicotine medicine that is taken by mouth.  Talk with your health care provider about combining strategies, such as taking medicines while you are also receiving in-person counseling. Using these two strategies together   makes you more likely to succeed in quitting than if you used either strategy on its own. If you are pregnant or breastfeeding, talk with your health care provider about finding counseling or other support strategies to quit smoking. Do not take medicine to help you quit smoking unless told to do so by your health care  provider. What things can I do to make it easier to quit? Quitting smoking might feel overwhelming at first, but there is a lot that you can do to make it easier. Take these important actions:  Reach out to your family and friends and ask that they support and encourage you during this time. Call telephone quitlines, reach out to support groups, or work with a counselor for support.  Ask people who smoke to avoid smoking around you.  Avoid places that trigger you to smoke, such as bars, parties, or smoke-break areas at work.  Spend time around people who do not smoke.  Lessen stress in your life, because stress can be a smoking trigger for some people. To lessen stress, try: ? Exercising regularly. ? Deep-breathing exercises. ? Yoga. ? Meditating. ? Performing a body scan. This involves closing your eyes, scanning your body from head to toe, and noticing which parts of your body are particularly tense. Purposefully relax the muscles in those areas.  Download or purchase mobile phone or tablet apps (applications) that can help you stick to your quit plan by providing reminders, tips, and encouragement. There are many free apps, such as QuitGuide from the CDC (Centers for Disease Control and Prevention). You can find other support for quitting smoking (smoking cessation) through smokefree.gov and other websites.  How will I feel when I quit smoking? Within the first 24 hours of quitting smoking, you may start to feel some withdrawal symptoms. These symptoms are usually most noticeable 2-3 days after quitting, but they usually do not last beyond 2-3 weeks. Changes or symptoms that you might experience include:  Mood swings.  Restlessness, anxiety, or irritation.  Difficulty concentrating.  Dizziness.  Strong cravings for sugary foods in addition to nicotine.  Mild weight gain.  Constipation.  Nausea.  Coughing or a sore throat.  Changes in how your medicines work in your  body.  A depressed mood.  Difficulty sleeping (insomnia).  After the first 2-3 weeks of quitting, you may start to notice more positive results, such as:  Improved sense of smell and taste.  Decreased coughing and sore throat.  Slower heart rate.  Lower blood pressure.  Clearer skin.  The ability to breathe more easily.  Fewer sick days.  Quitting smoking is very challenging for most people. Do not get discouraged if you are not successful the first time. Some people need to make many attempts to quit before they achieve long-term success. Do your best to stick to your quit plan, and talk with your health care provider if you have any questions or concerns. This information is not intended to replace advice given to you by your health care provider. Make sure you discuss any questions you have with your health care provider. Document Released: 06/15/2001 Document Revised: 02/17/2016 Document Reviewed: 11/05/2014 Elsevier Interactive Patient Education  2017 [employer].  

## 2016-08-24 NOTE — Telephone Encounter (Signed)
Appointments scheduled per 2/20 LOS. Patient given AVS report and calendars with future scheduled appointments. °

## 2016-09-06 ENCOUNTER — Other Ambulatory Visit: Payer: Self-pay | Admitting: Radiology

## 2016-09-07 ENCOUNTER — Encounter (HOSPITAL_COMMUNITY): Payer: Self-pay

## 2016-09-07 ENCOUNTER — Ambulatory Visit (HOSPITAL_COMMUNITY)
Admission: RE | Admit: 2016-09-07 | Discharge: 2016-09-07 | Disposition: A | Discharge status: Home / Self Care | Payer: BLUE CROSS/BLUE SHIELD | Source: Ambulatory Visit | Attending: Internal Medicine | Admitting: Internal Medicine

## 2016-09-07 DIAGNOSIS — K449 Diaphragmatic hernia without obstruction or gangrene: Secondary | ICD-10-CM | POA: Diagnosis not present

## 2016-09-07 DIAGNOSIS — E785 Hyperlipidemia, unspecified: Secondary | ICD-10-CM | POA: Diagnosis not present

## 2016-09-07 DIAGNOSIS — F17200 Nicotine dependence, unspecified, uncomplicated: Secondary | ICD-10-CM

## 2016-09-07 DIAGNOSIS — J439 Emphysema, unspecified: Secondary | ICD-10-CM | POA: Diagnosis not present

## 2016-09-07 DIAGNOSIS — I700 Atherosclerosis of aorta: Secondary | ICD-10-CM | POA: Insufficient documentation

## 2016-09-07 DIAGNOSIS — N400 Enlarged prostate without lower urinary tract symptoms: Secondary | ICD-10-CM | POA: Diagnosis not present

## 2016-09-07 DIAGNOSIS — K219 Gastro-esophageal reflux disease without esophagitis: Secondary | ICD-10-CM | POA: Insufficient documentation

## 2016-09-07 DIAGNOSIS — I10 Essential (primary) hypertension: Secondary | ICD-10-CM | POA: Diagnosis not present

## 2016-09-07 DIAGNOSIS — R918 Other nonspecific abnormal finding of lung field: Secondary | ICD-10-CM

## 2016-09-07 DIAGNOSIS — F17210 Nicotine dependence, cigarettes, uncomplicated: Secondary | ICD-10-CM | POA: Diagnosis not present

## 2016-09-07 DIAGNOSIS — N200 Calculus of kidney: Secondary | ICD-10-CM | POA: Insufficient documentation

## 2016-09-07 DIAGNOSIS — C3490 Malignant neoplasm of unspecified part of unspecified bronchus or lung: Secondary | ICD-10-CM | POA: Insufficient documentation

## 2016-09-07 DIAGNOSIS — F1010 Alcohol abuse, uncomplicated: Secondary | ICD-10-CM

## 2016-09-07 DIAGNOSIS — N281 Cyst of kidney, acquired: Secondary | ICD-10-CM | POA: Insufficient documentation

## 2016-09-07 DIAGNOSIS — M109 Gout, unspecified: Secondary | ICD-10-CM | POA: Diagnosis not present

## 2016-09-07 DIAGNOSIS — E119 Type 2 diabetes mellitus without complications: Secondary | ICD-10-CM | POA: Insufficient documentation

## 2016-09-07 DIAGNOSIS — Z794 Long term (current) use of insulin: Secondary | ICD-10-CM | POA: Diagnosis not present

## 2016-09-07 LAB — BASIC METABOLIC PANEL
Anion gap: 8 (ref 5–15)
BUN: 15 mg/dL (ref 6–20)
CO2: 25 mmol/L (ref 22–32)
Calcium: 8.9 mg/dL (ref 8.9–10.3)
Chloride: 104 mmol/L (ref 101–111)
Creatinine, Ser: 1.1 mg/dL (ref 0.61–1.24)
GFR calc Af Amer: >60 mL/min (ref >60)
GFR calc non Af Amer: >60 mL/min (ref >60)
Glucose, Bld: 128 mg/dL — ABNORMAL HIGH (ref 65–99)
Potassium: 3.7 mmol/L (ref 3.5–5.1)
Sodium: 137 mmol/L (ref 135–145)

## 2016-09-07 LAB — PROTIME-INR
INR: 0.98
Prothrombin Time: 13 seconds (ref 11.4–15.2)

## 2016-09-07 LAB — CBC WITH DIFFERENTIAL/PLATELET
Basophils Absolute: 0 10*3/uL (ref 0.0–0.1)
Basophils Relative: 1 %
Eosinophils Absolute: 0.3 10*3/uL (ref 0.0–0.7)
Eosinophils Relative: 6 %
HCT: 38.1 % — ABNORMAL LOW (ref 39.0–52.0)
Hemoglobin: 13 g/dL (ref 13.0–17.0)
Lymphocytes Relative: 33 %
Lymphs Abs: 1.5 10*3/uL (ref 0.7–4.0)
MCH: 29.5 pg (ref 26.0–34.0)
MCHC: 34.1 g/dL (ref 30.0–36.0)
MCV: 86.6 fL (ref 78.0–100.0)
Monocytes Absolute: 0.4 10*3/uL (ref 0.1–1.0)
Monocytes Relative: 8 %
Neutro Abs: 2.4 10*3/uL (ref 1.7–7.7)
Neutrophils Relative %: 52 %
Platelets: 217 10*3/uL (ref 150–400)
RBC: 4.4 MIL/uL (ref 4.22–5.81)
RDW: 15.8 % — ABNORMAL HIGH (ref 11.5–15.5)
WBC: 4.5 10*3/uL (ref 4.0–10.5)

## 2016-09-07 LAB — GLUCOSE, CAPILLARY: Glucose-Capillary: 121 mg/dL — ABNORMAL HIGH (ref 65–99)

## 2016-09-07 MED ORDER — MIDAZOLAM HCL 2 MG/2ML IJ SOLN
INTRAMUSCULAR | Status: AC
  Filled 2016-09-07: qty 4

## 2016-09-07 MED ORDER — FENTANYL CITRATE (PF) 100 MCG/2ML IJ SOLN
INTRAMUSCULAR | Status: AC | PRN
  Administered 2016-09-07 (×2): 50 ug via INTRAVENOUS

## 2016-09-07 MED ORDER — FLUMAZENIL 0.5 MG/5ML IV SOLN
INTRAVENOUS | Status: AC
  Filled 2016-09-07: qty 5

## 2016-09-07 MED ORDER — SODIUM CHLORIDE 0.9 % IV SOLN
INTRAVENOUS | Status: DC
  Administered 2016-09-07: 11:00:00 via INTRAVENOUS

## 2016-09-07 MED ORDER — NALOXONE HCL 0.4 MG/ML IJ SOLN
INTRAMUSCULAR | Status: AC
  Filled 2016-09-07: qty 1

## 2016-09-07 MED ORDER — MIDAZOLAM HCL 2 MG/2ML IJ SOLN
INTRAMUSCULAR | Status: AC | PRN
  Administered 2016-09-07 (×2): 1 mg via INTRAVENOUS

## 2016-09-07 MED ORDER — FENTANYL CITRATE (PF) 100 MCG/2ML IJ SOLN
INTRAMUSCULAR | Status: AC
  Filled 2016-09-07: qty 4

## 2016-09-07 NOTE — Discharge Instructions (Signed)
Needle Biopsy, Care After °These instructions give you information about caring for yourself after your procedure. Your doctor may also give you more specific instructions. Call your doctor if you have any problems or questions after your procedure. °Follow these instructions at home: °· Rest as told by your doctor. °· Take medicines only as told by your doctor. °· There are many different ways to close and cover the biopsy site, including stitches (sutures), skin glue, and adhesive strips. Follow instructions from your doctor about: °¨ How to take care of your biopsy site. °¨ When and how you should change your bandage (dressing). °¨ When you should remove your dressing. °¨ Removing whatever was used to close your biopsy site. °· Check your biopsy site every day for signs of infection. Watch for: °¨ Redness, swelling, or pain. °¨ Fluid, blood, or pus. °Contact a doctor if: °· You have a fever. °· You have redness, swelling, or pain at the biopsy site, and it lasts longer than a few days. °· You have fluid, blood, or pus coming from the biopsy site. °· You feel sick to your stomach (nauseous). °· You throw up (vomit). °Get help right away if: °· You are short of breath. °· You have trouble breathing. °· Your chest hurts. °· You feel dizzy or you pass out (faint). °· You have bleeding that does not stop with pressure or a bandage. °· You cough up blood. °· Your belly (abdomen) hurts. °This information is not intended to replace advice given to you by your health care provider. Make sure you discuss any questions you have with your health care provider. °Document Released: 06/03/2008 Document Revised: 11/27/2015 Document Reviewed: 06/17/2014 °Elsevier Interactive Patient Education © 2017 [employer] ° ° °Moderate Conscious Sedation, Adult, Care After °These instructions provide you with information about caring for yourself after your procedure. Your health care provider may also give you more specific instructions.  Your treatment has been planned according to current medical practices, but problems sometimes occur. Call your health care provider if you have any problems or questions after your procedure. °What can I expect after the procedure? °After your procedure, it is common: °· To feel sleepy for several hours. °· To feel clumsy and have poor balance for several hours. °· To have poor judgment for several hours. °· To vomit if you eat too soon. °Follow these instructions at home: °For at least 24 hours after the procedure:  ° °· Do not: °¨ Participate in activities where you could fall or become injured. °¨ Drive. °¨ Use heavy machinery. °¨ Drink alcohol. °¨ Take sleeping pills or medicines that cause drowsiness. °¨ Make important decisions or sign legal documents. °¨ Take care of children on your own. °· Rest. °Eating and drinking  °· Follow the diet recommended by your health care provider. °· If you vomit: °¨ Drink water, juice, or soup when you can drink without vomiting. °¨ Make sure you have little or no nausea before eating solid foods. °General instructions  °· Have a responsible adult stay with you until you are awake and alert. °· Take over-the-counter and prescription medicines only as told by your health care provider. °· If you smoke, do not smoke without supervision. °· Keep all follow-up visits as told by your health care provider. This is important. °Contact a health care provider if: °· You keep feeling nauseous or you keep vomiting. °· You feel light-headed. °· You develop a rash. °· You have a fever. °Get help right   away if: °· You have trouble breathing. °This information is not intended to replace advice given to you by your health care provider. Make sure you discuss any questions you have with your health care provider. °Document Released: 04/11/2013 Document Revised: 11/24/2015 Document Reviewed: 10/11/2015 °Elsevier Interactive Patient Education © 2017 [employer] ° °
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
## 2016-09-07 NOTE — Procedures (Signed)
L neck LN Bx FNA 25 g times four No comp/EBL

## 2016-09-21 ENCOUNTER — Ambulatory Visit (HOSPITAL_BASED_OUTPATIENT_CLINIC_OR_DEPARTMENT_OTHER): Payer: BLUE CROSS/BLUE SHIELD | Admitting: Internal Medicine

## 2016-09-21 ENCOUNTER — Telehealth: Payer: Self-pay | Admitting: Internal Medicine

## 2016-09-21 ENCOUNTER — Encounter: Payer: Self-pay | Admitting: Internal Medicine

## 2016-09-21 VITALS — BP 142/83 | HR 82 | Temp 98.1°F | Resp 18 | Wt 232.1 lb

## 2016-09-21 DIAGNOSIS — E119 Type 2 diabetes mellitus without complications: Secondary | ICD-10-CM

## 2016-09-21 DIAGNOSIS — I10 Essential (primary) hypertension: Secondary | ICD-10-CM | POA: Diagnosis not present

## 2016-09-21 DIAGNOSIS — R918 Other nonspecific abnormal finding of lung field: Secondary | ICD-10-CM | POA: Diagnosis not present

## 2016-09-21 MED ORDER — TRAMADOL HCL 50 MG PO TABS: 50.0000 mg | ORAL_TABLET | Freq: Two times a day (BID) | ORAL | 0 refills | Status: DC
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
## 2016-09-21 NOTE — Telephone Encounter (Signed)
Appointments scheduled per 09/21/16 los. Patient was given a copy of the AVS report and appointment schedule, per 09/21/16 los.

## 2016-09-27 ENCOUNTER — Other Ambulatory Visit: Payer: Self-pay | Admitting: Radiology

## 2016-09-28 ENCOUNTER — Telehealth: Payer: Self-pay | Admitting: Medical Oncology

## 2016-09-28 ENCOUNTER — Telehealth: Payer: Self-pay | Admitting: Internal Medicine

## 2016-09-28 ENCOUNTER — Telehealth: Payer: Self-pay | Admitting: *Deleted

## 2016-09-28 NOTE — Telephone Encounter (Signed)
R/s appt per schedule message from Heide Guile, patient is aware of new appt date and time.

## 2016-09-28 NOTE — Telephone Encounter (Signed)
Pt notified and schedule request sent to move f/u to next week.

## 2016-09-28 NOTE — Telephone Encounter (Signed)
-----   Message from Curt Bears, MD sent at 09/28/2016  1:12 PM EDT ----- Regarding: RE: f/u  Yes, the following week if possible ----- Message ----- From: Ardeen Garland, RN Sent: 09/28/2016  11:00 AM To: Curt Bears, MD Subject: f/u                                            CT bx 3/28 and sees you next day-thursday Do I need to change f/u?

## 2016-09-28 NOTE — Telephone Encounter (Signed)
Will bx results be ready for f/u next day? Note to Linganore.

## 2016-09-28 NOTE — Telephone Encounter (Signed)
"  Someone called about my appointment this week.  Calling to confirm I will be there."  Provided CT scan appointment information as well with this call.  No further questions.

## 2016-09-29 ENCOUNTER — Ambulatory Visit (HOSPITAL_COMMUNITY)
Admission: RE | Admit: 2016-09-29 | Discharge: 2016-09-29 | Disposition: A | Discharge status: Home / Self Care | Payer: BLUE CROSS/BLUE SHIELD | Source: Ambulatory Visit | Attending: Internal Medicine | Admitting: Internal Medicine

## 2016-09-29 ENCOUNTER — Encounter (HOSPITAL_COMMUNITY): Payer: Self-pay

## 2016-09-29 DIAGNOSIS — R918 Other nonspecific abnormal finding of lung field: Secondary | ICD-10-CM | POA: Diagnosis not present

## 2016-09-29 DIAGNOSIS — M109 Gout, unspecified: Secondary | ICD-10-CM | POA: Diagnosis not present

## 2016-09-29 DIAGNOSIS — Z794 Long term (current) use of insulin: Secondary | ICD-10-CM | POA: Diagnosis not present

## 2016-09-29 DIAGNOSIS — I10 Essential (primary) hypertension: Secondary | ICD-10-CM | POA: Insufficient documentation

## 2016-09-29 DIAGNOSIS — F17210 Nicotine dependence, cigarettes, uncomplicated: Secondary | ICD-10-CM | POA: Insufficient documentation

## 2016-09-29 DIAGNOSIS — E119 Type 2 diabetes mellitus without complications: Secondary | ICD-10-CM | POA: Diagnosis not present

## 2016-09-29 DIAGNOSIS — K219 Gastro-esophageal reflux disease without esophagitis: Secondary | ICD-10-CM | POA: Insufficient documentation

## 2016-09-29 DIAGNOSIS — E785 Hyperlipidemia, unspecified: Secondary | ICD-10-CM | POA: Diagnosis not present

## 2016-09-29 DIAGNOSIS — Z9889 Other specified postprocedural states: Secondary | ICD-10-CM

## 2016-09-29 LAB — CBC
HCT: 38.2 % — ABNORMAL LOW (ref 39.0–52.0)
Hemoglobin: 12.8 g/dL — ABNORMAL LOW (ref 13.0–17.0)
MCH: 29.8 pg (ref 26.0–34.0)
MCHC: 33.5 g/dL (ref 30.0–36.0)
MCV: 88.8 fL (ref 78.0–100.0)
Platelets: 206 10*3/uL (ref 150–400)
RBC: 4.3 MIL/uL (ref 4.22–5.81)
RDW: 15.7 % — ABNORMAL HIGH (ref 11.5–15.5)
WBC: 5 10*3/uL (ref 4.0–10.5)

## 2016-09-29 LAB — PROTIME-INR
INR: 1.02
Prothrombin Time: 13.5 seconds (ref 11.4–15.2)

## 2016-09-29 LAB — APTT: aPTT: 31 seconds (ref 24–36)

## 2016-09-29 LAB — GLUCOSE, CAPILLARY: Glucose-Capillary: 122 mg/dL — ABNORMAL HIGH (ref 65–99)

## 2016-09-29 MED ORDER — SODIUM CHLORIDE 0.9 % IV SOLN: INTRAVENOUS | Status: DC

## 2016-09-29 MED ORDER — MIDAZOLAM HCL 2 MG/2ML IJ SOLN
INTRAMUSCULAR | Status: AC
  Filled 2016-09-29: qty 6

## 2016-09-29 MED ORDER — MIDAZOLAM HCL 2 MG/2ML IJ SOLN
INTRAMUSCULAR | Status: DC | PRN
  Administered 2016-09-29: 0.5 mg via INTRAVENOUS

## 2016-09-29 MED ORDER — LIDOCAINE HCL 1 % IJ SOLN
INTRAMUSCULAR | Status: AC
  Filled 2016-09-29: qty 20

## 2016-09-29 MED ORDER — MIDAZOLAM HCL 2 MG/2ML IJ SOLN
INTRAMUSCULAR | Status: AC | PRN
  Administered 2016-09-29 (×2): 0.5 mg via INTRAVENOUS

## 2016-09-29 MED ORDER — FENTANYL CITRATE (PF) 100 MCG/2ML IJ SOLN
INTRAMUSCULAR | Status: AC
  Filled 2016-09-29: qty 4

## 2016-09-29 MED ORDER — FENTANYL CITRATE (PF) 100 MCG/2ML IJ SOLN
INTRAMUSCULAR | Status: AC | PRN
  Administered 2016-09-29 (×2): 25 ug via INTRAVENOUS

## 2016-09-29 NOTE — Progress Notes (Signed)
Pt and daughter are able to repeat discharge instructions and state understanding. VSS. Back site clean dry and intact with no drainage.

## 2016-09-29 NOTE — Discharge Instructions (Signed)
.  HOLD METFORMIN FOR 48 HOURS.  MAY TAKE NEXT DOSE Saturday   Needle Biopsy, Care After These instructions give you information about caring for yourself after your procedure. Your doctor may also give you more specific instructions. Call your doctor if you have any problems or questions after your procedure. Follow these instructions at home:  Rest as told by your doctor.  Take medicines only as told by your doctor.  There are many different ways to close and cover the biopsy site, including stitches (sutures), skin glue, and adhesive strips. Follow instructions from your doctor about:  How to take care of your biopsy site.  When and how you should change your bandage (dressing).  When you should remove your dressing.  Removing whatever was used to close your biopsy site.  Check your biopsy site every day for signs of infection. Watch for:  Redness, swelling, or pain.  Fluid, blood, or pus. Contact a doctor if:  You have a fever.  You have redness, swelling, or pain at the biopsy site, and it lasts longer than a few days.  You have fluid, blood, or pus coming from the biopsy site.  You feel sick to your stomach (nauseous).  You throw up (vomit). Get help right away if:  You are short of breath.  You have trouble breathing.  Your chest hurts.  You feel dizzy or you pass out (faint).  You have bleeding that does not stop with pressure or a bandage.  You cough up blood.  Your belly (abdomen) hurts. This information is not intended to replace advice given to you by your health care provider. Make sure you discuss any questions you have with your health care provider. Document Released: 06/03/2008 Document Revised: 11/27/2015 Document Reviewed: 06/17/2014 Elsevier Interactive Patient Education  2017 [employer].

## 2016-09-29 NOTE — Sedation Documentation (Addendum)
Bandaide to RLL lung Bx site CDI. [name]

## 2016-09-29 NOTE — Procedures (Signed)
Pre procedural Dx: Hypermetabolic right lower lobe mass  Post procedural Dx: Same  Technically successful CT guided biopsy of indeterminate hypermetabolic right lower lobe pulmonary mass   EBL: None.   Complications: None immediate.   Ronny Bacon, MD Pager #: [phone]
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
## 2016-09-30 ENCOUNTER — Other Ambulatory Visit: Payer: BLUE CROSS/BLUE SHIELD

## 2016-09-30 ENCOUNTER — Ambulatory Visit: Payer: BLUE CROSS/BLUE SHIELD | Admitting: Internal Medicine

## 2016-10-05 ENCOUNTER — Encounter (HOSPITAL_COMMUNITY): Payer: Self-pay

## 2016-10-07 ENCOUNTER — Encounter: Payer: Self-pay | Admitting: Medical Oncology

## 2016-10-07 ENCOUNTER — Encounter: Payer: Self-pay | Admitting: Internal Medicine

## 2016-10-07 ENCOUNTER — Other Ambulatory Visit (HOSPITAL_BASED_OUTPATIENT_CLINIC_OR_DEPARTMENT_OTHER): Payer: BLUE CROSS/BLUE SHIELD

## 2016-10-07 ENCOUNTER — Ambulatory Visit (HOSPITAL_BASED_OUTPATIENT_CLINIC_OR_DEPARTMENT_OTHER): Payer: BLUE CROSS/BLUE SHIELD | Admitting: Internal Medicine

## 2016-10-07 ENCOUNTER — Encounter: Payer: Self-pay | Admitting: Radiation Oncology

## 2016-10-07 ENCOUNTER — Telehealth: Payer: Self-pay | Admitting: Internal Medicine

## 2016-10-07 ENCOUNTER — Encounter: Payer: Self-pay | Admitting: *Deleted

## 2016-10-07 VITALS — BP 140/89 | HR 94 | Temp 99.5°F | Resp 18 | Ht 74.0 in | Wt 230.5 lb

## 2016-10-07 DIAGNOSIS — Z716 Tobacco abuse counseling: Secondary | ICD-10-CM

## 2016-10-07 DIAGNOSIS — E119 Type 2 diabetes mellitus without complications: Secondary | ICD-10-CM | POA: Diagnosis not present

## 2016-10-07 DIAGNOSIS — C3431 Malignant neoplasm of lower lobe, right bronchus or lung: Secondary | ICD-10-CM

## 2016-10-07 DIAGNOSIS — C3491 Malignant neoplasm of unspecified part of right bronchus or lung: Secondary | ICD-10-CM | POA: Insufficient documentation

## 2016-10-07 DIAGNOSIS — Z5111 Encounter for antineoplastic chemotherapy: Secondary | ICD-10-CM

## 2016-10-07 DIAGNOSIS — C771 Secondary and unspecified malignant neoplasm of intrathoracic lymph nodes: Secondary | ICD-10-CM | POA: Diagnosis not present

## 2016-10-07 DIAGNOSIS — Z7189 Other specified counseling: Secondary | ICD-10-CM

## 2016-10-07 DIAGNOSIS — R918 Other nonspecific abnormal finding of lung field: Secondary | ICD-10-CM

## 2016-10-07 HISTORY — DX: Malignant neoplasm of unspecified part of right bronchus or lung: C34.91

## 2016-10-07 LAB — CBC WITH DIFFERENTIAL/PLATELET
BASO%: 2.3 % — ABNORMAL HIGH (ref 0.0–2.0)
Basophils Absolute: 0.1 10*3/uL (ref 0.0–0.1)
EOS%: 5.2 % (ref 0.0–7.0)
Eosinophils Absolute: 0.2 10*3/uL (ref 0.0–0.5)
HCT: 38.8 % (ref 38.4–49.9)
HGB: 13.1 g/dL (ref 13.0–17.1)
LYMPH%: 33.2 % (ref 14.0–49.0)
MCH: 30.2 pg (ref 27.2–33.4)
MCHC: 33.7 g/dL (ref 32.0–36.0)
MCV: 89.5 fL (ref 79.3–98.0)
MONO#: 0.3 10*3/uL (ref 0.1–0.9)
MONO%: 7.6 % (ref 0.0–14.0)
NEUT#: 2.4 10*3/uL (ref 1.5–6.5)
NEUT%: 51.7 % (ref 39.0–75.0)
Platelets: 226 10*3/uL (ref 140–400)
RBC: 4.34 10*6/uL (ref 4.20–5.82)
RDW: 16.6 % — ABNORMAL HIGH (ref 11.0–14.6)
WBC: 4.6 10*3/uL (ref 4.0–10.3)
lymph#: 1.5 10*3/uL (ref 0.9–3.3)

## 2016-10-07 LAB — COMPREHENSIVE METABOLIC PANEL
ALT: 49 U/L (ref 0–55)
AST: 52 U/L — ABNORMAL HIGH (ref 5–34)
Albumin: 3.3 g/dL — ABNORMAL LOW (ref 3.5–5.0)
Alkaline Phosphatase: 130 U/L (ref 40–150)
Anion Gap: 10 mEq/L (ref 3–11)
BUN: 19.7 mg/dL (ref 7.0–26.0)
CO2: 22 mEq/L (ref 22–29)
Calcium: 9.1 mg/dL (ref 8.4–10.4)
Chloride: 107 mEq/L (ref 98–109)
Creatinine: 1.1 mg/dL (ref 0.7–1.3)
EGFR: 86 mL/min/{1.73_m2} — ABNORMAL LOW (ref >90)
Glucose: 115 mg/dl (ref 70–140)
Potassium: 3.9 mEq/L (ref 3.5–5.1)
Sodium: 140 mEq/L (ref 136–145)
Total Bilirubin: 0.47 mg/dL (ref 0.20–1.20)
Total Protein: 8.3 g/dL (ref 6.4–8.3)

## 2016-10-07 NOTE — Progress Notes (Signed)
START ON PATHWAY REGIMEN - Non-Small Cell Lung     Administer weekly:     Paclitaxel      Carboplatin   **Always confirm dose/schedule in your pharmacy ordering system**    Patient Characteristics: Stage III - Unresectable, PS = 0, 1 AJCC T Category: T2a Current Disease Status: No Distant Mets or Local Recurrence AJCC N Category: N2 AJCC M Category: M0 AJCC 8 Stage Grouping: IIIA Performance Status: PS = 0, 1 Intent of Therapy: Curative Intent, Discussed with Patient 

## 2016-10-07 NOTE — Telephone Encounter (Signed)
Appointments scheduled per 4.5.18 LOS. Patient given AVS report and calendars with future scheduled appointments. Appointments scheduled to begin on 4.17.18 instead of 4.16.18 per room in infusion, patient prefers Tuesday appointments so it's okay. Spoke with infusion manager and desk nurse to be sure the change would be okay.
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
## 2016-10-08 ENCOUNTER — Encounter: Payer: Self-pay | Admitting: *Deleted

## 2016-10-09 ENCOUNTER — Encounter: Payer: Self-pay | Admitting: Internal Medicine

## 2016-10-09 DIAGNOSIS — Z7189 Other specified counseling: Secondary | ICD-10-CM

## 2016-10-09 DIAGNOSIS — Z5111 Encounter for antineoplastic chemotherapy: Secondary | ICD-10-CM | POA: Insufficient documentation

## 2016-10-09 HISTORY — DX: Encounter for antineoplastic chemotherapy: Z51.11

## 2016-10-09 HISTORY — DX: Other specified counseling: Z71.89

## 2016-10-12 ENCOUNTER — Encounter: Payer: Self-pay | Admitting: *Deleted

## 2016-10-12 ENCOUNTER — Telehealth: Payer: Self-pay | Admitting: Family Medicine

## 2016-10-12 ENCOUNTER — Other Ambulatory Visit: Payer: BLUE CROSS/BLUE SHIELD

## 2016-10-12 NOTE — Telephone Encounter (Signed)
Pt came in to advise Dr Jarold Song that he is going to start chemotherapy treatments. Please f/u

## 2016-10-13 NOTE — Telephone Encounter (Signed)
Noted  

## 2016-10-14 ENCOUNTER — Telehealth: Payer: Self-pay | Admitting: *Deleted

## 2016-10-14 ENCOUNTER — Ambulatory Visit
Admission: RE | Admit: 2016-10-14 | Discharge: 2016-10-14 | Disposition: A | Discharge status: Home / Self Care | Payer: BLUE CROSS/BLUE SHIELD | Source: Ambulatory Visit | Attending: Radiation Oncology | Admitting: Radiation Oncology

## 2016-10-14 ENCOUNTER — Encounter: Payer: Self-pay | Admitting: Radiation Oncology

## 2016-10-14 VITALS — BP 139/88 | HR 85 | Temp 98.0°F | Resp 20 | Ht 74.0 in | Wt 236.4 lb

## 2016-10-14 DIAGNOSIS — C3431 Malignant neoplasm of lower lobe, right bronchus or lung: Secondary | ICD-10-CM

## 2016-10-14 DIAGNOSIS — R918 Other nonspecific abnormal finding of lung field: Secondary | ICD-10-CM | POA: Diagnosis present

## 2016-10-14 NOTE — Progress Notes (Addendum)
Thoracic Location of Tumor / Histology: Right lower lobe lung  Stage III A , in addition to mediastinal and left cervical lymnphadenopathy  Patient presented { months ago with symptoms of: Chest pain, shortness breath  ,cough   Biopsies of   revealed: Diagnosis 09/29/2016: Lung, needle/core biopsy(ies), Right Lower Lobe - SQUAMOUS CELL CARCINOMA.  Tobacco/Marijuana/Snuff/ETOH use: 1/2ppd  Cigarettes x 30  Years,quit 2 months ago,   9:55 AM smokeless tobacco, drinks 4.2-6 drinks alcohol per week,no drug use   Past/Anticipated interventions by cardiothoracic surgery, if any: Exploratory  laparotomy 20+years ago ,Hernia repair  repair incarcerated Ventral incisional hernia and small bowel obstruction 05/11/2011  Past/Anticipated interventions by medical oncology, if any: Dr. Julien Nordmann  10/07/16:  To start chemo radiation with weekly carboplatin for AUC of 2 and paclitaxel 1st dose 10/19/16, f/u 10/26/16  Signs/Symptoms  Weight changes, if any: NO  Respiratory complaints, if any: Shortness breath mild exertion  Hemoptysis, if any: No  Pain issues, if any:  NO  SAFETY ISSUES: NO  Prior radiation? NO  Pacemaker/ICD?  NO  Is the patient on methotrexate?NO  Current Complaints / other details: Sinfle, 4 children,   Diabetes, HTN, gout Mother Diabetes, Father stomach  cancer, sister MS,brother heart failure  Allergies: Lisinopril,ace inhibitors BP 139/88 (BP Location: Left Arm, Patient Position: Sitting, Cuff Size: Large)   Pulse 85   Temp 98 F (36.7 C) (Oral)   Resp 20   Ht '6\' 2"'$  (1.88 m)   Wt 236 lb 6.4 oz (107.2 kg)   SpO2 100%   BMI 30.35 kg/m   Wt Readings from Last 3 Encounters:  10/14/16 236 lb 6.4 oz (107.2 kg)  10/07/16 230 lb 8 oz (104.6 kg)  09/29/16 232 lb (105.2 kg)

## 2016-10-14 NOTE — Telephone Encounter (Signed)
Oncology Nurse Navigator Documentation  Oncology Nurse Navigator Flowsheets 10/14/2016  Navigator Location [facility]  Navigator Encounter Type Telephone/I updated Rad Onc on patient schedule.  Patient needed to be seen sooner than 4/25.  They were able to accomidate and get patient in today for an appt.  I updated patient. He states he will be here.  Appt is 12:30 today.  He verbalized understanding of appt.   Telephone Outgoing Call  Treatment Phase Pre-Tx/Tx Discussion  Barriers/Navigation Needs Coordination of Care  Interventions Coordination of Care  Coordination of Care Appts  Acuity Level 2  Acuity Level 2 Educational needs  Time Spent with Patient 60
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
## 2016-10-14 NOTE — Progress Notes (Signed)
Please see the Nurse Progress Note in the MD Initial Consult Encounter for this patient. 

## 2016-10-15 DIAGNOSIS — R918 Other nonspecific abnormal finding of lung field: Secondary | ICD-10-CM | POA: Diagnosis not present

## 2016-10-18 ENCOUNTER — Ambulatory Visit
Admission: RE | Admit: 2016-10-18 | Discharge: 2016-10-18 | Disposition: A | Discharge status: Home / Self Care | Payer: BLUE CROSS/BLUE SHIELD | Source: Ambulatory Visit | Attending: Radiation Oncology | Admitting: Radiation Oncology

## 2016-10-18 DIAGNOSIS — R918 Other nonspecific abnormal finding of lung field: Secondary | ICD-10-CM | POA: Diagnosis not present

## 2016-10-19 ENCOUNTER — Ambulatory Visit: Payer: BLUE CROSS/BLUE SHIELD

## 2016-10-19 ENCOUNTER — Ambulatory Visit
Admission: RE | Admit: 2016-10-19 | Discharge: 2016-10-19 | Disposition: A | Discharge status: Home / Self Care | Payer: BLUE CROSS/BLUE SHIELD | Source: Ambulatory Visit | Attending: Radiation Oncology | Admitting: Radiation Oncology

## 2016-10-19 ENCOUNTER — Other Ambulatory Visit: Payer: BLUE CROSS/BLUE SHIELD

## 2016-10-19 DIAGNOSIS — R918 Other nonspecific abnormal finding of lung field: Secondary | ICD-10-CM | POA: Diagnosis not present

## 2016-10-20 ENCOUNTER — Ambulatory Visit
Admission: RE | Admit: 2016-10-20 | Discharge: 2016-10-20 | Disposition: A | Discharge status: Home / Self Care | Payer: BLUE CROSS/BLUE SHIELD | Source: Ambulatory Visit | Attending: Radiation Oncology | Admitting: Radiation Oncology

## 2016-10-20 DIAGNOSIS — R918 Other nonspecific abnormal finding of lung field: Secondary | ICD-10-CM | POA: Diagnosis not present

## 2016-10-20 MED ORDER — BIAFINE EX EMUL
Freq: Every day | CUTANEOUS | Status: DC
  Administered 2016-10-20: 16:00:00 via TOPICAL

## 2016-10-20 NOTE — Progress Notes (Signed)
Pt education done, biafine cream, my business card, and radiation therapy and you book given, discussed skin irritation,difficulty swallowing , throat changes, nausea,  Fatigue,pain,need to eat soft foods that are easiier to swallow, smaller meals, 506 with snacks in between, increasecalories and protein, water in diet,stay hydrated, use biafine to chest area  After radiation treatments where radiation occurs daily and prn, sees MD weekly every Friday and prn,

## 2016-10-21 ENCOUNTER — Ambulatory Visit
Admission: RE | Admit: 2016-10-21 | Discharge: 2016-10-21 | Disposition: A | Discharge status: Home / Self Care | Payer: BLUE CROSS/BLUE SHIELD | Source: Ambulatory Visit | Attending: Radiation Oncology | Admitting: Radiation Oncology

## 2016-10-21 DIAGNOSIS — R918 Other nonspecific abnormal finding of lung field: Secondary | ICD-10-CM | POA: Diagnosis not present

## 2016-10-22 ENCOUNTER — Ambulatory Visit
Admission: RE | Admit: 2016-10-22 | Discharge: 2016-10-22 | Disposition: A | Discharge status: Home / Self Care | Payer: BLUE CROSS/BLUE SHIELD | Source: Ambulatory Visit | Attending: Radiation Oncology | Admitting: Radiation Oncology

## 2016-10-22 DIAGNOSIS — C3431 Malignant neoplasm of lower lobe, right bronchus or lung: Secondary | ICD-10-CM | POA: Insufficient documentation

## 2016-10-22 DIAGNOSIS — R918 Other nonspecific abnormal finding of lung field: Secondary | ICD-10-CM | POA: Diagnosis not present

## 2016-10-22 NOTE — Progress Notes (Signed)
  Radiation Oncology         [phone] ________________________________  Name: [name] MRN: [id number]  Date: 10/14/2016  DOB: 06-Feb-1956  SIMULATION AND TREATMENT PLANNING NOTE  DIAGNOSIS:     ICD-9-CM ICD-10-CM   1. Cancer of bronchus of right lower lobe ([facility]) 162.5 C34.31      Site:  Right lower lobe  NARRATIVE:  The patient was brought to the [facility].  Identity was confirmed.  All relevant records and images related to the planned course of therapy were reviewed.   Written consent to proceed with treatment was confirmed which was freely given after reviewing the details related to the planned course of therapy had been reviewed with the patient.  Then, the patient was set-up in a stable reproducible  supine position for radiation therapy.  CT images were obtained.  Surface markings were placed.    Medically necessary complex treatment device(s) for immobilization:  Customized vac lock.   The CT images were loaded into the planning software.  Then the target and avoidance structures were contoured.  Treatment planning then occurred.  The radiation prescription was entered and confirmed.  I have requested : 3D Simulation  I have requested a DVH of the following structures: Target volume, lungs, esophagus, spinal cord. This will be treated using a 3-D conformal technique on our tomotherapy unit. The number of complex treatment devices/customized fields will be determined by the planning sinogram..   The patient will undergo daily image guidance to ensure accurate localization of the target, and adequate minimize dose to the normal surrounding structures in close proximity to the target.   PLAN:  The patient will receive 60 Gy in 30 fractions initially. Pending his treatment plan, the patient may receive an additional 6 Gray in 3 fractions using a cone down technique.    Special treatment procedure The patient will also receive concurrent chemotherapy  during the treatment. The patient may therefore experience increased toxicity or side effects and the patient will be monitored for such problems. This may require extra lab work as necessary. This therefore constitutes a special treatment procedure.   ________________________________   Jodelle Gross, MD, PhD

## 2016-10-25 ENCOUNTER — Telehealth: Payer: Self-pay | Admitting: Internal Medicine

## 2016-10-25 ENCOUNTER — Other Ambulatory Visit: Payer: Self-pay | Admitting: Radiation Oncology

## 2016-10-25 ENCOUNTER — Ambulatory Visit
Admission: RE | Admit: 2016-10-25 | Discharge: 2016-10-25 | Disposition: A | Discharge status: Home / Self Care | Payer: BLUE CROSS/BLUE SHIELD | Source: Ambulatory Visit | Attending: Radiation Oncology | Admitting: Radiation Oncology

## 2016-10-25 ENCOUNTER — Encounter: Payer: Self-pay | Admitting: *Deleted

## 2016-10-25 ENCOUNTER — Telehealth: Payer: Self-pay | Admitting: Medical Oncology

## 2016-10-25 DIAGNOSIS — C3491 Malignant neoplasm of unspecified part of right bronchus or lung: Secondary | ICD-10-CM

## 2016-10-25 DIAGNOSIS — R918 Other nonspecific abnormal finding of lung field: Secondary | ICD-10-CM | POA: Diagnosis not present

## 2016-10-25 MED ORDER — VARENICLINE TARTRATE 0.5 MG PO TABS: 0.5000 mg | ORAL_TABLET | Freq: Two times a day (BID) | ORAL | 0 refills | Status: DC

## 2016-10-25 NOTE — Telephone Encounter (Signed)
RN called to see if we had pt disability papers, I told karen I did not see any documentation about it , but will forward message to [facility]

## 2016-10-25 NOTE — Telephone Encounter (Signed)
Called and spoke with Neale Burly on 10/18/16 to advise that disability forms were completed and ready for pickup at the front desk reception area. Scanned completed copy in Epic

## 2016-10-25 NOTE — Progress Notes (Signed)
Patient was questioning status of short term disability paperwork.  No notes found to indicate paperwork was received by our department.  Called [facility] and they state they do not have documentation either.  Instructed patient to bring in new paperwork and we would initiate the paperwork for processing.

## 2016-10-26 ENCOUNTER — Other Ambulatory Visit (HOSPITAL_BASED_OUTPATIENT_CLINIC_OR_DEPARTMENT_OTHER): Payer: BLUE CROSS/BLUE SHIELD

## 2016-10-26 ENCOUNTER — Encounter: Payer: Self-pay | Admitting: Internal Medicine

## 2016-10-26 ENCOUNTER — Ambulatory Visit (HOSPITAL_BASED_OUTPATIENT_CLINIC_OR_DEPARTMENT_OTHER): Payer: BLUE CROSS/BLUE SHIELD

## 2016-10-26 ENCOUNTER — Telehealth: Payer: Self-pay | Admitting: Internal Medicine

## 2016-10-26 ENCOUNTER — Ambulatory Visit
Admission: RE | Admit: 2016-10-26 | Discharge: 2016-10-26 | Disposition: A | Discharge status: Home / Self Care | Payer: BLUE CROSS/BLUE SHIELD | Source: Ambulatory Visit | Attending: Radiation Oncology | Admitting: Radiation Oncology

## 2016-10-26 ENCOUNTER — Ambulatory Visit (HOSPITAL_BASED_OUTPATIENT_CLINIC_OR_DEPARTMENT_OTHER): Payer: BLUE CROSS/BLUE SHIELD | Admitting: Internal Medicine

## 2016-10-26 VITALS — BP 122/82 | HR 68 | Temp 98.5°F | Resp 17

## 2016-10-26 VITALS — BP 121/75 | HR 88 | Temp 98.1°F | Resp 18 | Ht 74.0 in | Wt 231.0 lb

## 2016-10-26 DIAGNOSIS — C3431 Malignant neoplasm of lower lobe, right bronchus or lung: Secondary | ICD-10-CM

## 2016-10-26 DIAGNOSIS — C3491 Malignant neoplasm of unspecified part of right bronchus or lung: Secondary | ICD-10-CM

## 2016-10-26 DIAGNOSIS — C771 Secondary and unspecified malignant neoplasm of intrathoracic lymph nodes: Secondary | ICD-10-CM | POA: Diagnosis not present

## 2016-10-26 DIAGNOSIS — Z5111 Encounter for antineoplastic chemotherapy: Secondary | ICD-10-CM

## 2016-10-26 DIAGNOSIS — R918 Other nonspecific abnormal finding of lung field: Secondary | ICD-10-CM | POA: Diagnosis not present

## 2016-10-26 LAB — CBC WITH DIFFERENTIAL/PLATELET
BASO%: 0.5 % (ref 0.0–2.0)
Basophils Absolute: 0 10*3/uL (ref 0.0–0.1)
EOS%: 7.4 % — ABNORMAL HIGH (ref 0.0–7.0)
Eosinophils Absolute: 0.3 10*3/uL (ref 0.0–0.5)
HCT: 37.4 % — ABNORMAL LOW (ref 38.4–49.9)
HGB: 12.4 g/dL — ABNORMAL LOW (ref 13.0–17.1)
LYMPH%: 22.5 % (ref 14.0–49.0)
MCH: 30 pg (ref 27.2–33.4)
MCHC: 33.2 g/dL (ref 32.0–36.0)
MCV: 90.3 fL (ref 79.3–98.0)
MONO#: 0.4 10*3/uL (ref 0.1–0.9)
MONO%: 10.7 % (ref 0.0–14.0)
NEUT#: 2.1 10*3/uL (ref 1.5–6.5)
NEUT%: 58.9 % (ref 39.0–75.0)
Platelets: 178 10*3/uL (ref 140–400)
RBC: 4.14 10*6/uL — ABNORMAL LOW (ref 4.20–5.82)
RDW: 15.2 % — ABNORMAL HIGH (ref 11.0–14.6)
WBC: 3.6 10*3/uL — ABNORMAL LOW (ref 4.0–10.3)
lymph#: 0.8 10*3/uL — ABNORMAL LOW (ref 0.9–3.3)

## 2016-10-26 LAB — COMPREHENSIVE METABOLIC PANEL
ALT: 25 U/L (ref 0–55)
AST: 30 U/L (ref 5–34)
Albumin: 3.3 g/dL — ABNORMAL LOW (ref 3.5–5.0)
Alkaline Phosphatase: 117 U/L (ref 40–150)
Anion Gap: 12 mEq/L — ABNORMAL HIGH (ref 3–11)
BUN: 22.1 mg/dL (ref 7.0–26.0)
CO2: 23 mEq/L (ref 22–29)
Calcium: 9.8 mg/dL (ref 8.4–10.4)
Chloride: 103 mEq/L (ref 98–109)
Creatinine: 1.1 mg/dL (ref 0.7–1.3)
EGFR: 85 mL/min/{1.73_m2} — ABNORMAL LOW (ref >90)
Glucose: 137 mg/dl (ref 70–140)
Potassium: 3.4 mEq/L — ABNORMAL LOW (ref 3.5–5.1)
Sodium: 138 mEq/L (ref 136–145)
Total Bilirubin: 1.01 mg/dL (ref 0.20–1.20)
Total Protein: 8.3 g/dL (ref 6.4–8.3)

## 2016-10-26 MED ORDER — DIPHENHYDRAMINE HCL 50 MG/ML IJ SOLN
50.0000 mg | Freq: Once | INTRAMUSCULAR | Status: AC
  Administered 2016-10-26: 50 mg via INTRAVENOUS

## 2016-10-26 MED ORDER — PALONOSETRON HCL INJECTION 0.25 MG/5ML
INTRAVENOUS | Status: AC
  Filled 2016-10-26: qty 5

## 2016-10-26 MED ORDER — SODIUM CHLORIDE 0.9 % IV SOLN
Freq: Once | INTRAVENOUS | Status: AC
  Administered 2016-10-26: 11:00:00 via INTRAVENOUS

## 2016-10-26 MED ORDER — FAMOTIDINE IN NACL 20-0.9 MG/50ML-% IV SOLN
20.0000 mg | Freq: Once | INTRAVENOUS | Status: AC
Start: 2016-10-26 — End: 2016-10-26
  Administered 2016-10-26: 20 mg via INTRAVENOUS

## 2016-10-26 MED ORDER — SODIUM CHLORIDE 0.9 % IV SOLN
256.0000 mg | Freq: Once | INTRAVENOUS | Status: AC
  Administered 2016-10-26: 260 mg via INTRAVENOUS
  Filled 2016-10-26: qty 26

## 2016-10-26 MED ORDER — DIPHENHYDRAMINE HCL 50 MG/ML IJ SOLN
INTRAMUSCULAR | Status: AC
  Filled 2016-10-26: qty 1

## 2016-10-26 MED ORDER — FAMOTIDINE IN NACL 20-0.9 MG/50ML-% IV SOLN
INTRAVENOUS | Status: AC
  Filled 2016-10-26: qty 50

## 2016-10-26 MED ORDER — SODIUM CHLORIDE 0.9 % IV SOLN
20.0000 mg | Freq: Once | INTRAVENOUS | Status: AC
  Administered 2016-10-26: 20 mg via INTRAVENOUS
  Filled 2016-10-26: qty 2

## 2016-10-26 MED ORDER — PALONOSETRON HCL INJECTION 0.25 MG/5ML
0.2500 mg | Freq: Once | INTRAVENOUS | Status: AC
  Administered 2016-10-26: 0.25 mg via INTRAVENOUS

## 2016-10-26 MED ORDER — DEXTROSE 5 % IV SOLN
45.0000 mg/m2 | Freq: Once | INTRAVENOUS | Status: AC
  Administered 2016-10-26: 108 mg via INTRAVENOUS
  Filled 2016-10-26: qty 18
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
## 2016-10-26 NOTE — Patient Instructions (Signed)
[facility] Discharge Instructions for Patients Receiving Chemotherapy  Today you received the following chemotherapy agents paclitaxel (Taxol) and carboplatin (Paraplatin)  To help prevent nausea and vomiting after your treatment, we encourage you to take your nausea medication as directed by your doctor.   If you develop nausea and vomiting that is not controlled by your nausea medication, call the clinic.   BELOW ARE SYMPTOMS THAT SHOULD BE REPORTED IMMEDIATELY:  *FEVER GREATER THAN 100.5 F  *CHILLS WITH OR WITHOUT FEVER  NAUSEA AND VOMITING THAT IS NOT CONTROLLED WITH YOUR NAUSEA MEDICATION  *UNUSUAL SHORTNESS OF BREATH  *UNUSUAL BRUISING OR BLEEDING  TENDERNESS IN MOUTH AND THROAT WITH OR WITHOUT PRESENCE OF ULCERS  *URINARY PROBLEMS  *BOWEL PROBLEMS  UNUSUAL RASH Items with * indicate a potential emergency and should be followed up as soon as possible.  Feel free to call the clinic you have any questions or concerns. The clinic phone number is [phone].  Please show the [facility] at check-in to the Emergency Department and triage nurse.  Palonosetron Injection What is this medicine? PALONOSETRON (pal oh NOE se tron) is used to prevent nausea and vomiting caused by chemotherapy. It also helps prevent delayed nausea and vomiting that may occur a few days after your treatment. This medicine may be used for other purposes; ask your health care provider or pharmacist if you have questions. COMMON BRAND NAME(S): Aloxi What should I tell my health care provider before I take this medicine? They need to know if you have any of these conditions: -an unusual or allergic reaction to palonosetron, dolasetron, granisetron, ondansetron, other medicines, foods, dyes, or preservatives -pregnant or trying to get pregnant -breast-feeding How should I use this medicine? This medicine is for infusion into a vein. It is given by a health care professional in a  hospital or clinic setting. Talk to your pediatrician regarding the use of this medicine in children. While this drug may be prescribed for children as young as 1 month for selected conditions, precautions do apply. Overdosage: If you think you have taken too much of this medicine contact a poison control center or emergency room at once. NOTE: This medicine is only for you. Do not share this medicine with others. What if I miss a dose? This does not apply. What may interact with this medicine? -certain medicines for depression, anxiety, or psychotic disturbances -fentanyl -linezolid -MAOIs like Carbex, Eldepryl, Marplan, Nardil, and Parnate -methylene blue (injected into a vein) -tramadol This list may not describe all possible interactions. Give your health care provider a list of all the medicines, herbs, non-prescription drugs, or dietary supplements you use. Also tell them if you smoke, drink alcohol, or use illegal drugs. Some items may interact with your medicine. What should I watch for while using this medicine? Your condition will be monitored carefully while you are receiving this medicine. What side effects may I notice from receiving this medicine? Side effects that you should report to your doctor or health care professional as soon as possible: -allergic reactions like skin rash, itching or hives, swelling of the face, lips, or tongue -breathing problems -confusion -dizziness -fast, irregular heartbeat -fever and chills -loss of balance or coordination -seizures -sweating -swelling of the hands and feet -tremors -unusually weak or tired Side effects that usually do not require medical attention (report to your doctor or health care professional if they continue or are bothersome): -constipation or diarrhea -headache This list may not describe all possible  side effects. Call your doctor for medical advice about side effects. You may report side effects to FDA at  1-800-FDA-1088. Where should I keep my medicine? This drug is given in a hospital or clinic and will not be stored at home. NOTE: This sheet is a summary. It may not cover all possible information. If you have questions about this medicine, talk to your doctor, pharmacist, or health care provider.  2018 Elsevier/Gold Standard (2013-04-27 10:38:36)  Diphenhydramine injection What is this medicine? DIPHENHYDRAMINE (dye fen HYE dra meen) is an antihistamine. It is used to treat the symptoms of an allergic reaction and motion sickness. It is also used to treat Parkinson's disease. This medicine may be used for other purposes; ask your health care provider or pharmacist if you have questions. COMMON BRAND NAME(S): Benadryl What should I tell my health care provider before I take this medicine? They need to know if you have any of these conditions: -asthma or lung disease -glaucoma -high blood pressure or heart disease -liver disease -pain or difficulty passing urine -prostate trouble -ulcers or other stomach problems -an unusual or allergic reaction to diphenhydramine, antihistamines, other medicines foods, dyes, or preservatives -pregnant or trying to get pregnant -breast-feeding How should I use this medicine? This medicine is for injection into a vein or a muscle. It is usually given by a health care professional in a hospital or clinic setting. If you get this medicine at home, you will be taught how to prepare and give this medicine. Use exactly as directed. Take your medicine at regular intervals. Do not take your medicine more often than directed. It is important that you put your used needles and syringes in a special sharps container. Do not put them in a trash can. If you do not have a sharps container, call your pharmacist or healthcare provider to get one. Talk to your pediatrician regarding the use of this medicine in children. While this drug may be prescribed for selected  conditions, precautions do apply. This medicine is not approved for use in newborns and premature babies. Patients over 69 years old may have a stronger reaction and need a smaller dose. Overdosage: If you think you have taken too much of this medicine contact a poison control center or emergency room at once. NOTE: This medicine is only for you. Do not share this medicine with others. What if I miss a dose? If you miss a dose, take it as soon as you can. If it is almost time for your next dose, take only that dose. Do not take double or extra doses. What may interact with this medicine? Do not take this medicine with any of the following medications: -MAOIs like Carbex, Eldepryl, Marplan, Nardil, and Parnate This medicine may also interact with the following medications: -alcohol -barbiturates, like phenobarbital -medicines for bladder spasm like oxybutynin, tolterodine -medicines for blood pressure -medicines for depression, anxiety, or psychotic disturbances -medicines for movement abnormalities or Parkinson's disease -medicines for sleep -other medicines for cold, cough or allergy -some medicines for the stomach like chlordiazepoxide, dicyclomine This list may not describe all possible interactions. Give your health care provider a list of all the medicines, herbs, non-prescription drugs, or dietary supplements you use. Also tell them if you smoke, drink alcohol, or use illegal drugs. Some items may interact with your medicine. What should I watch for while using this medicine? Your condition will be monitored carefully while you are receiving this medicine. Tell your doctor or healthcare professional if  your symptoms do not start to get better or if they get worse. You may get drowsy or dizzy. Do not drive, use machinery, or do anything that needs mental alertness until you know how this medicine affects you. Do not stand or sit up quickly, especially if you are an older patient. This  reduces the risk of dizzy or fainting spells. Alcohol may interfere with the effect of this medicine. Avoid alcoholic drinks. Your mouth may get dry. Chewing sugarless gum or sucking hard candy, and drinking plenty of water may help. Contact your doctor if the problem does not go away or is severe. What side effects may I notice from receiving this medicine? Side effects that you should report to your doctor or health care professional as soon as possible: -allergic reactions like skin rash, itching or hives, swelling of the face, lips, or tongue -breathing problems -changes in vision -chills -confused, agitated, nervous -irregular or fast heartbeat -low blood pressure -seizures -tremor -trouble passing urine -unusual bleeding or bruising -unusually weak or tired Side effects that usually do not require medical attention (report to your doctor or health care professional if they continue or are bothersome): -constipation, diarrhea -drowsy -headache -loss of appetite -stomach upset, vomiting -sweating -thick mucous This list may not describe all possible side effects. Call your doctor for medical advice about side effects. You may report side effects to FDA at 1-800-FDA-1088. Where should I keep my medicine? Keep out of the reach of children. If you are using this medicine at home, you will be instructed on how to store this medicine. Throw away any unused medicine after the expiration date on the label. NOTE: This sheet is a summary. It may not cover all possible information. If you have questions about this medicine, talk to your doctor, pharmacist, or health care provider.  2018 Elsevier/Gold Standard (2007-10-10 14:28:35)  Famotidine injection What is this medicine? FAMOTIDINE (fa MOE ti deen) is a type of antihistamine that blocks the release of stomach acid. It is used to treat stomach or intestinal ulcers. It can relieve ulcer pain and discomfort, and the heartburn from acid  reflux. This medicine may be used for other purposes; ask your health care provider or pharmacist if you have questions. COMMON BRAND NAME(S): Pepcid What should I tell my health care provider before I take this medicine? They need to know if you have any of these conditions: -kidney or liver disease -an unusual or allergic reaction to famotidine, other medicines, foods, dyes, or preservatives -pregnant or trying to get pregnant -breast-feeding How should I use this medicine? This medicine is for infusion into a vein. It is given by a health care professional in a hospital or clinic setting. Talk to your pediatrician regarding the use of this medicine in children. Special care may be needed. Overdosage: If you think you have taken too much of this medicine contact a poison control center or emergency room at once. NOTE: This medicine is only for you. Do not share this medicine with others. What if I miss a dose? This does not apply. What may interact with this medicine? -delavirdine -itraconazole -ketoconazole This list may not describe all possible interactions. Give your health care provider a list of all the medicines, herbs, non-prescription drugs, or dietary supplements you use. Also tell them if you smoke, drink alcohol, or use illegal drugs. Some items may interact with your medicine. What should I watch for while using this medicine? Tell your doctor or health care professional if  your condition does not start to get better or gets worse. Do not take with aspirin, ibuprofen, or other antiinflammatory medicines. These can aggravate your condition. Do not smoke cigarettes or drink alcohol. These increase irritation in your stomach and can increase the time it will take for ulcers to heal. Cigarettes and alcohol can also worsen acid reflux or heartburn. If you get black, tarry stools or vomit up what looks like coffee grounds, call your doctor or health care professional at once. You  may have a bleeding ulcer. What side effects may I notice from receiving this medicine? Side effects that you should report to your doctor or health care professional as soon as possible: -allergic reactions like skin rash, itching or hives, swelling of the face, lips, or tongue -agitation, nervousness -confusion -hallucinations Side effects that usually do not require medical attention (report to your doctor or health care professional if they continue or are bothersome): -constipation -diarrhea -dizziness -headache This list may not describe all possible side effects. Call your doctor for medical advice about side effects. You may report side effects to FDA at 1-800-FDA-1088. Where should I keep my medicine? This medicine is given in a hospital or clinic. You will not be given this medicine to store at home. NOTE: This sheet is a summary. It may not cover all possible information. If you have questions about this medicine, talk to your doctor, pharmacist, or health care provider.  2018 Elsevier/Gold Standard (2007-10-25 13:24:51)  Dexamethasone injection What is this medicine? DEXAMETHASONE (dex a METH a sone) is a corticosteroid. It is used to treat inflammation of the skin, joints, lungs, and other organs. Common conditions treated include asthma, allergies, and arthritis. It is also used for other conditions, like blood disorders and diseases of the adrenal glands. This medicine may be used for other purposes; ask your health care provider or pharmacist if you have questions. COMMON BRAND NAME(S): Decadron, DoubleDex, Simplist Dexamethasone, Solurex What should I tell my health care provider before I take this medicine? They need to know if you have any of these conditions: -blood clotting problems -Cushing's syndrome -diabetes -glaucoma -heart problems or disease -high blood pressure -infection like herpes, measles, tuberculosis, or chickenpox -kidney disease -liver  disease -mental problems -myasthenia gravis -osteoporosis -previous heart attack -seizures -stomach, ulcer or intestine disease including colitis and diverticulitis -thyroid problem -an unusual or allergic reaction to dexamethasone, corticosteroids, other medicines, lactose, foods, dyes, or preservatives -pregnant or trying to get pregnant -breast-feeding How should I use this medicine? This medicine is for injection into a muscle, joint, lesion, soft tissue, or vein. It is given by a health care professional in a hospital or clinic setting. Talk to your pediatrician regarding the use of this medicine in children. Special care may be needed. Overdosage: If you think you have taken too much of this medicine contact a poison control center or emergency room at once. NOTE: This medicine is only for you. Do not share this medicine with others. What if I miss a dose? This may not apply. If you are having a series of injections over a prolonged period, try not to miss an appointment. Call your doctor or health care professional to reschedule if you are unable to keep an appointment. What may interact with this medicine? Do not take this medicine with any of the following medications: -mifepristone, RU-486 -vaccines This medicine may also interact with the following medications: -amphotericin B -antibiotics like clarithromycin, erythromycin, and troleandomycin -aspirin and aspirin-like drugs -barbiturates like  phenobarbital -carbamazepine -cholestyramine -cholinesterase inhibitors like donepezil, galantamine, rivastigmine, and tacrine -cyclosporine -digoxin -diuretics -ephedrine -male hormones, like estrogens or progestins and birth control pills -indinavir -isoniazid -ketoconazole -medicines for diabetes -medicines that improve muscle tone or strength for conditions like myasthenia gravis -NSAIDs, medicines for pain and inflammation, like ibuprofen or  naproxen -phenytoin -rifampin -thalidomide -warfarin This list may not describe all possible interactions. Give your health care provider a list of all the medicines, herbs, non-prescription drugs, or dietary supplements you use. Also tell them if you smoke, drink alcohol, or use illegal drugs. Some items may interact with your medicine. What should I watch for while using this medicine? Your condition will be monitored carefully while you are receiving this medicine. If you are taking this medicine for a long time, carry an identification card with your name and address, the type and dose of your medicine, and your doctor's name and address. This medicine may increase your risk of getting an infection. Stay away from people who are sick. Tell your doctor or health care professional if you are around anyone with measles or chickenpox. Talk to your health care provider before you get any vaccines that you take this medicine. If you are going to have surgery, tell your doctor or health care professional that you have taken this medicine within the last twelve months. Ask your doctor or health care professional about your diet. You may need to lower the amount of salt you eat. The medicine can increase your blood sugar. If you are a diabetic check with your doctor if you need help adjusting the dose of your diabetic medicine. What side effects may I notice from receiving this medicine? Side effects that you should report to your doctor or health care professional as soon as possible: -allergic reactions like skin rash, itching or hives, swelling of the face, lips, or tongue -black or tarry stools -change in the amount of urine -changes in vision -confusion, excitement, restlessness, a false sense of well-being -fever, sore throat, sneezing, cough, or other signs of infection, wounds that will not heal -hallucinations -increased thirst -mental depression, mood swings, mistaken feelings of self  importance or of being mistreated -pain in hips, back, ribs, arms, shoulders, or legs -pain, redness, or irritation at the injection site -redness, blistering, peeling or loosening of the skin, including inside the mouth -rounding out of face -swelling of feet or lower legs -unusual bleeding or bruising -unusual tired or weak -wounds that do not heal Side effects that usually do not require medical attention (report to your doctor or health care professional if they continue or are bothersome): -diarrhea or constipation -change in taste -headache -nausea, vomiting -skin problems, acne, thin and shiny skin -touble sleeping -unusual growth of hair on the face or body -weight gain This list may not describe all possible side effects. Call your doctor for medical advice about side effects. You may report side effects to FDA at 1-800-FDA-1088. Where should I keep my medicine? This drug is given in a hospital or clinic and will not be stored at home. NOTE: This sheet is a summary. It may not cover all possible information. If you have questions about this medicine, talk to your doctor, pharmacist, or health care provider.  2018 Elsevier/Gold Standard (2007-10-12 14:04:12)  Paclitaxel injection What is this medicine? PACLITAXEL (PAK li TAX el) is a chemotherapy drug. It targets fast dividing cells, like cancer cells, and causes these cells to die. This medicine is used to treat ovarian cancer,  breast cancer, and other cancers. This medicine may be used for other purposes; ask your health care provider or pharmacist if you have questions. COMMON BRAND NAME(S): Onxol, Taxol What should I tell my health care provider before I take this medicine? They need to know if you have any of these conditions: -blood disorders -irregular heartbeat -infection (especially a virus infection such as chickenpox, cold sores, or herpes) -liver disease -previous or ongoing radiation therapy -an unusual or  allergic reaction to paclitaxel, alcohol, polyoxyethylated castor oil, other chemotherapy agents, other medicines, foods, dyes, or preservatives -pregnant or trying to get pregnant -breast-feeding How should I use this medicine? This drug is given as an infusion into a vein. It is administered in a hospital or clinic by a specially trained health care professional. Talk to your pediatrician regarding the use of this medicine in children. Special care may be needed. Overdosage: If you think you have taken too much of this medicine contact a poison control center or emergency room at once. NOTE: This medicine is only for you. Do not share this medicine with others. What if I miss a dose? It is important not to miss your dose. Call your doctor or health care professional if you are unable to keep an appointment. What may interact with this medicine? Do not take this medicine with any of the following medications: -disulfiram -metronidazole This medicine may also interact with the following medications: -cyclosporine -diazepam -ketoconazole -medicines to increase blood counts like filgrastim, pegfilgrastim, sargramostim -other chemotherapy drugs like cisplatin, doxorubicin, epirubicin, etoposide, teniposide, vincristine -quinidine -testosterone -vaccines -verapamil Talk to your doctor or health care professional before taking any of these medicines: -acetaminophen -aspirin -ibuprofen -ketoprofen -naproxen This list may not describe all possible interactions. Give your health care provider a list of all the medicines, herbs, non-prescription drugs, or dietary supplements you use. Also tell them if you smoke, drink alcohol, or use illegal drugs. Some items may interact with your medicine. What should I watch for while using this medicine? Your condition will be monitored carefully while you are receiving this medicine. You will need important blood work done while you are taking this  medicine. This medicine can cause serious allergic reactions. To reduce your risk you will need to take other medicine(s) before treatment with this medicine. If you experience allergic reactions like skin rash, itching or hives, swelling of the face, lips, or tongue, tell your doctor or health care professional right away. In some cases, you may be given additional medicines to help with side effects. Follow all directions for their use. This drug may make you feel generally unwell. This is not uncommon, as chemotherapy can affect healthy cells as well as cancer cells. Report any side effects. Continue your course of treatment even though you feel ill unless your doctor tells you to stop. Call your doctor or health care professional for advice if you get a fever, chills or sore throat, or other symptoms of a cold or flu. Do not treat yourself. This drug decreases your body's ability to fight infections. Try to avoid being around people who are sick. This medicine may increase your risk to bruise or bleed. Call your doctor or health care professional if you notice any unusual bleeding. Be careful brushing and flossing your teeth or using a toothpick because you may get an infection or bleed more easily. If you have any dental work done, tell your dentist you are receiving this medicine. Avoid taking products that contain aspirin, acetaminophen, ibuprofen,  naproxen, or ketoprofen unless instructed by your doctor. These medicines may hide a fever. Do not become pregnant while taking this medicine. Women should inform their doctor if they wish to become pregnant or think they might be pregnant. There is a potential for serious side effects to an unborn child. Talk to your health care professional or pharmacist for more information. Do not breast-feed an infant while taking this medicine. Men are advised not to father a child while receiving this medicine. This product may contain alcohol. Ask your pharmacist  or healthcare provider if this medicine contains alcohol. Be sure to tell all healthcare providers you are taking this medicine. Certain medicines, like metronidazole and disulfiram, can cause an unpleasant reaction when taken with alcohol. The reaction includes flushing, headache, nausea, vomiting, sweating, and increased thirst. The reaction can last from 30 minutes to several hours. What side effects may I notice from receiving this medicine? Side effects that you should report to your doctor or health care professional as soon as possible: -allergic reactions like skin rash, itching or hives, swelling of the face, lips, or tongue -low blood counts - This drug may decrease the number of white blood cells, red blood cells and platelets. You may be at increased risk for infections and bleeding. -signs of infection - fever or chills, cough, sore throat, pain or difficulty passing urine -signs of decreased platelets or bleeding - bruising, pinpoint red spots on the skin, black, tarry stools, nosebleeds -signs of decreased red blood cells - unusually weak or tired, fainting spells, lightheadedness -breathing problems -chest pain -high or low blood pressure -mouth sores -nausea and vomiting -pain, swelling, redness or irritation at the injection site -pain, tingling, numbness in the hands or feet -slow or irregular heartbeat -swelling of the ankle, feet, hands Side effects that usually do not require medical attention (report to your doctor or health care professional if they continue or are bothersome): -bone pain -complete hair loss including hair on your head, underarms, pubic hair, eyebrows, and eyelashes -changes in the color of fingernails -diarrhea -loosening of the fingernails -loss of appetite -muscle or joint pain -red flush to skin -sweating This list may not describe all possible side effects. Call your doctor for medical advice about side effects. You may report side effects to  FDA at 1-800-FDA-1088. Where should I keep my medicine? This drug is given in a hospital or clinic and will not be stored at home. NOTE: This sheet is a summary. It may not cover all possible information. If you have questions about this medicine, talk to your doctor, pharmacist, or health care provider.  2018 [employer] Standard (2015-04-22 19:58:00)  Carboplatin injection What is this medicine? CARBOPLATIN (KAR boe pla tin) is a chemotherapy drug. It targets fast dividing cells, like cancer cells, and causes these cells to die. This medicine is used to treat ovarian cancer and many other cancers. This medicine may be used for other purposes; ask your health care provider or pharmacist if you have questions. COMMON BRAND NAME(S): Paraplatin What should I tell my health care provider before I take this medicine? They need to know if you have any of these conditions: -blood disorders -hearing problems -kidney disease -recent or ongoing radiation therapy -an unusual or allergic reaction to carboplatin, cisplatin, other chemotherapy, other medicines, foods, dyes, or preservatives -pregnant or trying to get pregnant -breast-feeding How should I use this medicine? This drug is usually given as an infusion into a vein. It is administered in a hospital  or clinic by a specially trained health care professional. Talk to your pediatrician regarding the use of this medicine in children. Special care may be needed. Overdosage: If you think you have taken too much of this medicine contact a poison control center or emergency room at once. NOTE: This medicine is only for you. Do not share this medicine with others. What if I miss a dose? It is important not to miss a dose. Call your doctor or health care professional if you are unable to keep an appointment. What may interact with this medicine? -medicines for seizures -medicines to increase blood counts like filgrastim, pegfilgrastim,  sargramostim -some antibiotics like amikacin, gentamicin, neomycin, streptomycin, tobramycin -vaccines Talk to your doctor or health care professional before taking any of these medicines: -acetaminophen -aspirin -ibuprofen -ketoprofen -naproxen This list may not describe all possible interactions. Give your health care provider a list of all the medicines, herbs, non-prescription drugs, or dietary supplements you use. Also tell them if you smoke, drink alcohol, or use illegal drugs. Some items may interact with your medicine. What should I watch for while using this medicine? Your condition will be monitored carefully while you are receiving this medicine. You will need important blood work done while you are taking this medicine. This drug may make you feel generally unwell. This is not uncommon, as chemotherapy can affect healthy cells as well as cancer cells. Report any side effects. Continue your course of treatment even though you feel ill unless your doctor tells you to stop. In some cases, you may be given additional medicines to help with side effects. Follow all directions for their use. Call your doctor or health care professional for advice if you get a fever, chills or sore throat, or other symptoms of a cold or flu. Do not treat yourself. This drug decreases your body's ability to fight infections. Try to avoid being around people who are sick. This medicine may increase your risk to bruise or bleed. Call your doctor or health care professional if you notice any unusual bleeding. Be careful brushing and flossing your teeth or using a toothpick because you may get an infection or bleed more easily. If you have any dental work done, tell your dentist you are receiving this medicine. Avoid taking products that contain aspirin, acetaminophen, ibuprofen, naproxen, or ketoprofen unless instructed by your doctor. These medicines may hide a fever. Do not become pregnant while taking this  medicine. Women should inform their doctor if they wish to become pregnant or think they might be pregnant. There is a potential for serious side effects to an unborn child. Talk to your health care professional or pharmacist for more information. Do not breast-feed an infant while taking this medicine. What side effects may I notice from receiving this medicine? Side effects that you should report to your doctor or health care professional as soon as possible: -allergic reactions like skin rash, itching or hives, swelling of the face, lips, or tongue -signs of infection - fever or chills, cough, sore throat, pain or difficulty passing urine -signs of decreased platelets or bleeding - bruising, pinpoint red spots on the skin, black, tarry stools, nosebleeds -signs of decreased red blood cells - unusually weak or tired, fainting spells, lightheadedness -breathing problems -changes in hearing -changes in vision -chest pain -high blood pressure -low blood counts - This drug may decrease the number of white blood cells, red blood cells and platelets. You may be at increased risk for infections and bleeding. -  nausea and vomiting -pain, swelling, redness or irritation at the injection site -pain, tingling, numbness in the hands or feet -problems with balance, talking, walking -trouble passing urine or change in the amount of urine Side effects that usually do not require medical attention (report to your doctor or health care professional if they continue or are bothersome): -hair loss -loss of appetite -metallic taste in the mouth or changes in taste This list may not describe all possible side effects. Call your doctor for medical advice about side effects. You may report side effects to FDA at 1-800-FDA-1088. Where should I keep my medicine? This drug is given in a hospital or clinic and will not be stored at home. NOTE: This sheet is a summary. It may not cover all possible information. If you  have questions about this medicine, talk to your doctor, pharmacist, or health care provider.  2018 [employer] Standard (2007-09-26 14:38:05)

## 2016-10-26 NOTE — Telephone Encounter (Signed)
Gave patient AVS and calender per 4/24 los - per MM move patients treatment back to Mondays

## 2016-10-27 ENCOUNTER — Ambulatory Visit: Payer: BLUE CROSS/BLUE SHIELD

## 2016-10-27 ENCOUNTER — Ambulatory Visit
Admission: RE | Admit: 2016-10-27 | Discharge: 2016-10-27 | Disposition: A | Discharge status: Home / Self Care | Payer: BLUE CROSS/BLUE SHIELD | Source: Ambulatory Visit | Attending: Radiation Oncology | Admitting: Radiation Oncology

## 2016-10-27 ENCOUNTER — Other Ambulatory Visit: Payer: Self-pay | Admitting: Medical Oncology

## 2016-10-27 ENCOUNTER — Other Ambulatory Visit: Payer: Self-pay | Admitting: Radiation Oncology

## 2016-10-27 ENCOUNTER — Ambulatory Visit: Payer: BLUE CROSS/BLUE SHIELD | Admitting: Radiation Oncology

## 2016-10-27 ENCOUNTER — Other Ambulatory Visit: Payer: Self-pay | Admitting: Internal Medicine

## 2016-10-27 ENCOUNTER — Telehealth: Payer: Self-pay | Admitting: Medical Oncology

## 2016-10-27 DIAGNOSIS — R918 Other nonspecific abnormal finding of lung field: Secondary | ICD-10-CM

## 2016-10-27 DIAGNOSIS — Z5111 Encounter for antineoplastic chemotherapy: Secondary | ICD-10-CM

## 2016-10-27 MED ORDER — PROCHLORPERAZINE MALEATE 10 MG PO TABS: 10.0000 mg | ORAL_TABLET | Freq: Four times a day (QID) | ORAL | 0 refills | Status: DC | PRN

## 2016-10-27 NOTE — Telephone Encounter (Signed)
Line busy

## 2016-10-27 NOTE — Progress Notes (Signed)
The patient was seen briefly on 10/25/16 complaining of itching at the site of several bug bites along his chest, upper abdomen, and neck after fishing in the evening over the weekend. He has tried oral benadryl, and evaluated the three sites which have raised erythematous edges with associates excoriation, the lesions measured about 1 cm in greatest dimension. I suggested he try topical hydrocortisone for itching, and continuing the benadryl as well. He will keep [place] informed of any progressive symptoms, but denied shortness of breath, edema of the lips or tongue.

## 2016-10-28 ENCOUNTER — Telehealth: Payer: Self-pay | Admitting: Medical Oncology

## 2016-10-28 ENCOUNTER — Ambulatory Visit
Admission: RE | Admit: 2016-10-28 | Discharge: 2016-10-28 | Disposition: A | Discharge status: Home / Self Care | Payer: BLUE CROSS/BLUE SHIELD | Source: Ambulatory Visit | Attending: Radiation Oncology | Admitting: Radiation Oncology

## 2016-10-28 DIAGNOSIS — R918 Other nonspecific abnormal finding of lung field: Secondary | ICD-10-CM | POA: Diagnosis not present

## 2016-10-28 NOTE — Telephone Encounter (Signed)
called in tramadol rx.

## 2016-10-29 ENCOUNTER — Ambulatory Visit
Admission: RE | Admit: 2016-10-29 | Discharge: 2016-10-29 | Disposition: A | Discharge status: Home / Self Care | Payer: BLUE CROSS/BLUE SHIELD | Source: Ambulatory Visit | Attending: Radiation Oncology | Admitting: Radiation Oncology

## 2016-10-29 DIAGNOSIS — R918 Other nonspecific abnormal finding of lung field: Secondary | ICD-10-CM | POA: Diagnosis not present

## 2016-11-01 ENCOUNTER — Telehealth: Payer: Self-pay

## 2016-11-01 ENCOUNTER — Ambulatory Visit
Admission: RE | Admit: 2016-11-01 | Discharge: 2016-11-01 | Disposition: A | Discharge status: Home / Self Care | Payer: BLUE CROSS/BLUE SHIELD | Source: Ambulatory Visit | Attending: Radiation Oncology | Admitting: Radiation Oncology

## 2016-11-01 ENCOUNTER — Ambulatory Visit (HOSPITAL_BASED_OUTPATIENT_CLINIC_OR_DEPARTMENT_OTHER): Payer: BLUE CROSS/BLUE SHIELD

## 2016-11-01 ENCOUNTER — Other Ambulatory Visit (HOSPITAL_BASED_OUTPATIENT_CLINIC_OR_DEPARTMENT_OTHER): Payer: BLUE CROSS/BLUE SHIELD

## 2016-11-01 VITALS — BP 146/90 | HR 81 | Temp 97.8°F | Resp 18

## 2016-11-01 DIAGNOSIS — C3431 Malignant neoplasm of lower lobe, right bronchus or lung: Secondary | ICD-10-CM

## 2016-11-01 DIAGNOSIS — C3491 Malignant neoplasm of unspecified part of right bronchus or lung: Secondary | ICD-10-CM

## 2016-11-01 DIAGNOSIS — C771 Secondary and unspecified malignant neoplasm of intrathoracic lymph nodes: Secondary | ICD-10-CM | POA: Diagnosis not present

## 2016-11-01 DIAGNOSIS — R918 Other nonspecific abnormal finding of lung field: Secondary | ICD-10-CM | POA: Diagnosis not present

## 2016-11-01 DIAGNOSIS — Z5111 Encounter for antineoplastic chemotherapy: Secondary | ICD-10-CM | POA: Diagnosis not present

## 2016-11-01 LAB — CBC WITH DIFFERENTIAL/PLATELET
BASO%: 0.8 % (ref 0.0–2.0)
Basophils Absolute: 0 10*3/uL (ref 0.0–0.1)
EOS%: 4.5 % (ref 0.0–7.0)
Eosinophils Absolute: 0.2 10*3/uL (ref 0.0–0.5)
HCT: 38.2 % — ABNORMAL LOW (ref 38.4–49.9)
HGB: 12.8 g/dL — ABNORMAL LOW (ref 13.0–17.1)
LYMPH%: 15.2 % (ref 14.0–49.0)
MCH: 30.8 pg (ref 27.2–33.4)
MCHC: 33.5 g/dL (ref 32.0–36.0)
MCV: 91.8 fL (ref 79.3–98.0)
MONO#: 0.3 10*3/uL (ref 0.1–0.9)
MONO%: 7.8 % (ref 0.0–14.0)
NEUT#: 2.8 10*3/uL (ref 1.5–6.5)
NEUT%: 71.7 % (ref 39.0–75.0)
Platelets: 178 10*3/uL (ref 140–400)
RBC: 4.16 10*6/uL — ABNORMAL LOW (ref 4.20–5.82)
RDW: 15.2 % — ABNORMAL HIGH (ref 11.0–14.6)
WBC: 4 10*3/uL (ref 4.0–10.3)
lymph#: 0.6 10*3/uL — ABNORMAL LOW (ref 0.9–3.3)

## 2016-11-01 LAB — COMPREHENSIVE METABOLIC PANEL
ALT: 103 U/L — ABNORMAL HIGH (ref 0–55)
AST: 163 U/L — ABNORMAL HIGH (ref 5–34)
Albumin: 3.3 g/dL — ABNORMAL LOW (ref 3.5–5.0)
Alkaline Phosphatase: 171 U/L — ABNORMAL HIGH (ref 40–150)
Anion Gap: 9 mEq/L (ref 3–11)
BUN: 25.2 mg/dL (ref 7.0–26.0)
CO2: 26 mEq/L (ref 22–29)
Calcium: 9.1 mg/dL (ref 8.4–10.4)
Chloride: 105 mEq/L (ref 98–109)
Creatinine: 1.4 mg/dL — ABNORMAL HIGH (ref 0.7–1.3)
EGFR: 62 mL/min/{1.73_m2} — ABNORMAL LOW (ref >90)
Glucose: 76 mg/dl (ref 70–140)
Potassium: 4.7 mEq/L (ref 3.5–5.1)
Sodium: 140 mEq/L (ref 136–145)
Total Bilirubin: 0.32 mg/dL (ref 0.20–1.20)
Total Protein: 8.2 g/dL (ref 6.4–8.3)

## 2016-11-01 MED ORDER — FAMOTIDINE IN NACL 20-0.9 MG/50ML-% IV SOLN
20.0000 mg | Freq: Once | INTRAVENOUS | Status: AC
  Administered 2016-11-01: 20 mg via INTRAVENOUS

## 2016-11-01 MED ORDER — SODIUM CHLORIDE 0.9 % IV SOLN
210.0000 mg | Freq: Once | INTRAVENOUS | Status: AC
  Administered 2016-11-01: 210 mg via INTRAVENOUS
  Filled 2016-11-01: qty 21

## 2016-11-01 MED ORDER — SODIUM CHLORIDE 0.9 % IV SOLN
20.0000 mg | Freq: Once | INTRAVENOUS | Status: AC
  Administered 2016-11-01: 20 mg via INTRAVENOUS
  Filled 2016-11-01: qty 2

## 2016-11-01 MED ORDER — SODIUM CHLORIDE 0.9 % IV SOLN
Freq: Once | INTRAVENOUS | Status: AC
  Administered 2016-11-01: 15:00:00 via INTRAVENOUS

## 2016-11-01 MED ORDER — FAMOTIDINE IN NACL 20-0.9 MG/50ML-% IV SOLN
INTRAVENOUS | Status: AC
  Filled 2016-11-01: qty 50

## 2016-11-01 MED ORDER — DIPHENHYDRAMINE HCL 50 MG/ML IJ SOLN
50.0000 mg | Freq: Once | INTRAMUSCULAR | Status: AC
  Administered 2016-11-01: 50 mg via INTRAVENOUS

## 2016-11-01 MED ORDER — PACLITAXEL CHEMO INJECTION 300 MG/50ML
45.0000 mg/m2 | Freq: Once | INTRAVENOUS | Status: AC
  Administered 2016-11-01: 108 mg via INTRAVENOUS
  Filled 2016-11-01: qty 18

## 2016-11-01 MED ORDER — PALONOSETRON HCL INJECTION 0.25 MG/5ML
0.2500 mg | Freq: Once | INTRAVENOUS | Status: AC
  Administered 2016-11-01: 0.25 mg via INTRAVENOUS

## 2016-11-01 MED ORDER — PALONOSETRON HCL INJECTION 0.25 MG/5ML
INTRAVENOUS | Status: AC
  Filled 2016-11-01: qty 5

## 2016-11-01 MED ORDER — DIPHENHYDRAMINE HCL 50 MG/ML IJ SOLN
INTRAMUSCULAR | Status: AC
  Filled 2016-11-01: qty 1

## 2016-11-01 NOTE — Patient Instructions (Signed)
[facility] Discharge Instructions for Patients Receiving Chemotherapy  Today you received the following chemotherapy agents paclitaxel (Taxol) and carboplatin (Paraplatin)  To help prevent nausea and vomiting after your treatment, we encourage you to take your nausea medication as directed by your doctor.   If you develop nausea and vomiting that is not controlled by your nausea medication, call the clinic.   BELOW ARE SYMPTOMS THAT SHOULD BE REPORTED IMMEDIATELY:  *FEVER GREATER THAN 100.5 F  *CHILLS WITH OR WITHOUT FEVER  NAUSEA AND VOMITING THAT IS NOT CONTROLLED WITH YOUR NAUSEA MEDICATION  *UNUSUAL SHORTNESS OF BREATH  *UNUSUAL BRUISING OR BLEEDING  TENDERNESS IN MOUTH AND THROAT WITH OR WITHOUT PRESENCE OF ULCERS  *URINARY PROBLEMS  *BOWEL PROBLEMS  UNUSUAL RASH Items with * indicate a potential emergency and should be followed up as soon as possible.  Feel free to call the clinic you have any questions or concerns. The clinic phone number is [phone].  Please show the [facility] at check-in to the Emergency Department and triage nurse.  Palonosetron Injection What is this medicine? PALONOSETRON (pal oh NOE se tron) is used to prevent nausea and vomiting caused by chemotherapy. It also helps prevent delayed nausea and vomiting that may occur a few days after your treatment. This medicine may be used for other purposes; ask your health care provider or pharmacist if you have questions. COMMON BRAND NAME(S): Aloxi What should I tell my health care provider before I take this medicine? They need to know if you have any of these conditions: -an unusual or allergic reaction to palonosetron, dolasetron, granisetron, ondansetron, other medicines, foods, dyes, or preservatives -pregnant or trying to get pregnant -breast-feeding How should I use this medicine? This medicine is for infusion into a vein. It is given by a health care professional in a  hospital or clinic setting. Talk to your pediatrician regarding the use of this medicine in children. While this drug may be prescribed for children as young as 1 month for selected conditions, precautions do apply. Overdosage: If you think you have taken too much of this medicine contact a poison control center or emergency room at once. NOTE: This medicine is only for you. Do not share this medicine with others. What if I miss a dose? This does not apply. What may interact with this medicine? -certain medicines for depression, anxiety, or psychotic disturbances -fentanyl -linezolid -MAOIs like Carbex, Eldepryl, Marplan, Nardil, and Parnate -methylene blue (injected into a vein) -tramadol This list may not describe all possible interactions. Give your health care provider a list of all the medicines, herbs, non-prescription drugs, or dietary supplements you use. Also tell them if you smoke, drink alcohol, or use illegal drugs. Some items may interact with your medicine. What should I watch for while using this medicine? Your condition will be monitored carefully while you are receiving this medicine. What side effects may I notice from receiving this medicine? Side effects that you should report to your doctor or health care professional as soon as possible: -allergic reactions like skin rash, itching or hives, swelling of the face, lips, or tongue -breathing problems -confusion -dizziness -fast, irregular heartbeat -fever and chills -loss of balance or coordination -seizures -sweating -swelling of the hands and feet -tremors -unusually weak or tired Side effects that usually do not require medical attention (report to your doctor or health care professional if they continue or are bothersome): -constipation or diarrhea -headache This list may not describe all possible  side effects. Call your doctor for medical advice about side effects. You may report side effects to FDA at  1-800-FDA-1088. Where should I keep my medicine? This drug is given in a hospital or clinic and will not be stored at home. NOTE: This sheet is a summary. It may not cover all possible information. If you have questions about this medicine, talk to your doctor, pharmacist, or health care provider.  2018 Elsevier/Gold Standard (2013-04-27 10:38:36)  Diphenhydramine injection What is this medicine? DIPHENHYDRAMINE (dye fen HYE dra meen) is an antihistamine. It is used to treat the symptoms of an allergic reaction and motion sickness. It is also used to treat Parkinson's disease. This medicine may be used for other purposes; ask your health care provider or pharmacist if you have questions. COMMON BRAND NAME(S): Benadryl What should I tell my health care provider before I take this medicine? They need to know if you have any of these conditions: -asthma or lung disease -glaucoma -high blood pressure or heart disease -liver disease -pain or difficulty passing urine -prostate trouble -ulcers or other stomach problems -an unusual or allergic reaction to diphenhydramine, antihistamines, other medicines foods, dyes, or preservatives -pregnant or trying to get pregnant -breast-feeding How should I use this medicine? This medicine is for injection into a vein or a muscle. It is usually given by a health care professional in a hospital or clinic setting. If you get this medicine at home, you will be taught how to prepare and give this medicine. Use exactly as directed. Take your medicine at regular intervals. Do not take your medicine more often than directed. It is important that you put your used needles and syringes in a special sharps container. Do not put them in a trash can. If you do not have a sharps container, call your pharmacist or healthcare provider to get one. Talk to your pediatrician regarding the use of this medicine in children. While this drug may be prescribed for selected  conditions, precautions do apply. This medicine is not approved for use in newborns and premature babies. Patients over 55 years old may have a stronger reaction and need a smaller dose. Overdosage: If you think you have taken too much of this medicine contact a poison control center or emergency room at once. NOTE: This medicine is only for you. Do not share this medicine with others. What if I miss a dose? If you miss a dose, take it as soon as you can. If it is almost time for your next dose, take only that dose. Do not take double or extra doses. What may interact with this medicine? Do not take this medicine with any of the following medications: -MAOIs like Carbex, Eldepryl, Marplan, Nardil, and Parnate This medicine may also interact with the following medications: -alcohol -barbiturates, like phenobarbital -medicines for bladder spasm like oxybutynin, tolterodine -medicines for blood pressure -medicines for depression, anxiety, or psychotic disturbances -medicines for movement abnormalities or Parkinson's disease -medicines for sleep -other medicines for cold, cough or allergy -some medicines for the stomach like chlordiazepoxide, dicyclomine This list may not describe all possible interactions. Give your health care provider a list of all the medicines, herbs, non-prescription drugs, or dietary supplements you use. Also tell them if you smoke, drink alcohol, or use illegal drugs. Some items may interact with your medicine. What should I watch for while using this medicine? Your condition will be monitored carefully while you are receiving this medicine. Tell your doctor or healthcare professional if  your symptoms do not start to get better or if they get worse. You may get drowsy or dizzy. Do not drive, use machinery, or do anything that needs mental alertness until you know how this medicine affects you. Do not stand or sit up quickly, especially if you are an older patient. This  reduces the risk of dizzy or fainting spells. Alcohol may interfere with the effect of this medicine. Avoid alcoholic drinks. Your mouth may get dry. Chewing sugarless gum or sucking hard candy, and drinking plenty of water may help. Contact your doctor if the problem does not go away or is severe. What side effects may I notice from receiving this medicine? Side effects that you should report to your doctor or health care professional as soon as possible: -allergic reactions like skin rash, itching or hives, swelling of the face, lips, or tongue -breathing problems -changes in vision -chills -confused, agitated, nervous -irregular or fast heartbeat -low blood pressure -seizures -tremor -trouble passing urine -unusual bleeding or bruising -unusually weak or tired Side effects that usually do not require medical attention (report to your doctor or health care professional if they continue or are bothersome): -constipation, diarrhea -drowsy -headache -loss of appetite -stomach upset, vomiting -sweating -thick mucous This list may not describe all possible side effects. Call your doctor for medical advice about side effects. You may report side effects to FDA at 1-800-FDA-1088. Where should I keep my medicine? Keep out of the reach of children. If you are using this medicine at home, you will be instructed on how to store this medicine. Throw away any unused medicine after the expiration date on the label. NOTE: This sheet is a summary. It may not cover all possible information. If you have questions about this medicine, talk to your doctor, pharmacist, or health care provider.  2018 Elsevier/Gold Standard (2007-10-10 14:28:35)  Famotidine injection What is this medicine? FAMOTIDINE (fa MOE ti deen) is a type of antihistamine that blocks the release of stomach acid. It is used to treat stomach or intestinal ulcers. It can relieve ulcer pain and discomfort, and the heartburn from acid  reflux. This medicine may be used for other purposes; ask your health care provider or pharmacist if you have questions. COMMON BRAND NAME(S): Pepcid What should I tell my health care provider before I take this medicine? They need to know if you have any of these conditions: -kidney or liver disease -an unusual or allergic reaction to famotidine, other medicines, foods, dyes, or preservatives -pregnant or trying to get pregnant -breast-feeding How should I use this medicine? This medicine is for infusion into a vein. It is given by a health care professional in a hospital or clinic setting. Talk to your pediatrician regarding the use of this medicine in children. Special care may be needed. Overdosage: If you think you have taken too much of this medicine contact a poison control center or emergency room at once. NOTE: This medicine is only for you. Do not share this medicine with others. What if I miss a dose? This does not apply. What may interact with this medicine? -delavirdine -itraconazole -ketoconazole This list may not describe all possible interactions. Give your health care provider a list of all the medicines, herbs, non-prescription drugs, or dietary supplements you use. Also tell them if you smoke, drink alcohol, or use illegal drugs. Some items may interact with your medicine. What should I watch for while using this medicine? Tell your doctor or health care professional if  your condition does not start to get better or gets worse. Do not take with aspirin, ibuprofen, or other antiinflammatory medicines. These can aggravate your condition. Do not smoke cigarettes or drink alcohol. These increase irritation in your stomach and can increase the time it will take for ulcers to heal. Cigarettes and alcohol can also worsen acid reflux or heartburn. If you get black, tarry stools or vomit up what looks like coffee grounds, call your doctor or health care professional at once. You  may have a bleeding ulcer. What side effects may I notice from receiving this medicine? Side effects that you should report to your doctor or health care professional as soon as possible: -allergic reactions like skin rash, itching or hives, swelling of the face, lips, or tongue -agitation, nervousness -confusion -hallucinations Side effects that usually do not require medical attention (report to your doctor or health care professional if they continue or are bothersome): -constipation -diarrhea -dizziness -headache This list may not describe all possible side effects. Call your doctor for medical advice about side effects. You may report side effects to FDA at 1-800-FDA-1088. Where should I keep my medicine? This medicine is given in a hospital or clinic. You will not be given this medicine to store at home. NOTE: This sheet is a summary. It may not cover all possible information. If you have questions about this medicine, talk to your doctor, pharmacist, or health care provider.  2018 Elsevier/Gold Standard (2007-10-25 13:24:51)  Dexamethasone injection What is this medicine? DEXAMETHASONE (dex a METH a sone) is a corticosteroid. It is used to treat inflammation of the skin, joints, lungs, and other organs. Common conditions treated include asthma, allergies, and arthritis. It is also used for other conditions, like blood disorders and diseases of the adrenal glands. This medicine may be used for other purposes; ask your health care provider or pharmacist if you have questions. COMMON BRAND NAME(S): Decadron, DoubleDex, Simplist Dexamethasone, Solurex What should I tell my health care provider before I take this medicine? They need to know if you have any of these conditions: -blood clotting problems -Cushing's syndrome -diabetes -glaucoma -heart problems or disease -high blood pressure -infection like herpes, measles, tuberculosis, or chickenpox -kidney disease -liver  disease -mental problems -myasthenia gravis -osteoporosis -previous heart attack -seizures -stomach, ulcer or intestine disease including colitis and diverticulitis -thyroid problem -an unusual or allergic reaction to dexamethasone, corticosteroids, other medicines, lactose, foods, dyes, or preservatives -pregnant or trying to get pregnant -breast-feeding How should I use this medicine? This medicine is for injection into a muscle, joint, lesion, soft tissue, or vein. It is given by a health care professional in a hospital or clinic setting. Talk to your pediatrician regarding the use of this medicine in children. Special care may be needed. Overdosage: If you think you have taken too much of this medicine contact a poison control center or emergency room at once. NOTE: This medicine is only for you. Do not share this medicine with others. What if I miss a dose? This may not apply. If you are having a series of injections over a prolonged period, try not to miss an appointment. Call your doctor or health care professional to reschedule if you are unable to keep an appointment. What may interact with this medicine? Do not take this medicine with any of the following medications: -mifepristone, RU-486 -vaccines This medicine may also interact with the following medications: -amphotericin B -antibiotics like clarithromycin, erythromycin, and troleandomycin -aspirin and aspirin-like drugs -barbiturates like  phenobarbital -carbamazepine -cholestyramine -cholinesterase inhibitors like donepezil, galantamine, rivastigmine, and tacrine -cyclosporine -digoxin -diuretics -ephedrine -male hormones, like estrogens or progestins and birth control pills -indinavir -isoniazid -ketoconazole -medicines for diabetes -medicines that improve muscle tone or strength for conditions like myasthenia gravis -NSAIDs, medicines for pain and inflammation, like ibuprofen or  naproxen -phenytoin -rifampin -thalidomide -warfarin This list may not describe all possible interactions. Give your health care provider a list of all the medicines, herbs, non-prescription drugs, or dietary supplements you use. Also tell them if you smoke, drink alcohol, or use illegal drugs. Some items may interact with your medicine. What should I watch for while using this medicine? Your condition will be monitored carefully while you are receiving this medicine. If you are taking this medicine for a long time, carry an identification card with your name and address, the type and dose of your medicine, and your doctor's name and address. This medicine may increase your risk of getting an infection. Stay away from people who are sick. Tell your doctor or health care professional if you are around anyone with measles or chickenpox. Talk to your health care provider before you get any vaccines that you take this medicine. If you are going to have surgery, tell your doctor or health care professional that you have taken this medicine within the last twelve months. Ask your doctor or health care professional about your diet. You may need to lower the amount of salt you eat. The medicine can increase your blood sugar. If you are a diabetic check with your doctor if you need help adjusting the dose of your diabetic medicine. What side effects may I notice from receiving this medicine? Side effects that you should report to your doctor or health care professional as soon as possible: -allergic reactions like skin rash, itching or hives, swelling of the face, lips, or tongue -black or tarry stools -change in the amount of urine -changes in vision -confusion, excitement, restlessness, a false sense of well-being -fever, sore throat, sneezing, cough, or other signs of infection, wounds that will not heal -hallucinations -increased thirst -mental depression, mood swings, mistaken feelings of self  importance or of being mistreated -pain in hips, back, ribs, arms, shoulders, or legs -pain, redness, or irritation at the injection site -redness, blistering, peeling or loosening of the skin, including inside the mouth -rounding out of face -swelling of feet or lower legs -unusual bleeding or bruising -unusual tired or weak -wounds that do not heal Side effects that usually do not require medical attention (report to your doctor or health care professional if they continue or are bothersome): -diarrhea or constipation -change in taste -headache -nausea, vomiting -skin problems, acne, thin and shiny skin -touble sleeping -unusual growth of hair on the face or body -weight gain This list may not describe all possible side effects. Call your doctor for medical advice about side effects. You may report side effects to FDA at 1-800-FDA-1088. Where should I keep my medicine? This drug is given in a hospital or clinic and will not be stored at home. NOTE: This sheet is a summary. It may not cover all possible information. If you have questions about this medicine, talk to your doctor, pharmacist, or health care provider.  2018 Elsevier/Gold Standard (2007-10-12 14:04:12)  Paclitaxel injection What is this medicine? PACLITAXEL (PAK li TAX el) is a chemotherapy drug. It targets fast dividing cells, like cancer cells, and causes these cells to die. This medicine is used to treat ovarian cancer,  breast cancer, and other cancers. This medicine may be used for other purposes; ask your health care provider or pharmacist if you have questions. COMMON BRAND NAME(S): Onxol, Taxol What should I tell my health care provider before I take this medicine? They need to know if you have any of these conditions: -blood disorders -irregular heartbeat -infection (especially a virus infection such as chickenpox, cold sores, or herpes) -liver disease -previous or ongoing radiation therapy -an unusual or  allergic reaction to paclitaxel, alcohol, polyoxyethylated castor oil, other chemotherapy agents, other medicines, foods, dyes, or preservatives -pregnant or trying to get pregnant -breast-feeding How should I use this medicine? This drug is given as an infusion into a vein. It is administered in a hospital or clinic by a specially trained health care professional. Talk to your pediatrician regarding the use of this medicine in children. Special care may be needed. Overdosage: If you think you have taken too much of this medicine contact a poison control center or emergency room at once. NOTE: This medicine is only for you. Do not share this medicine with others. What if I miss a dose? It is important not to miss your dose. Call your doctor or health care professional if you are unable to keep an appointment. What may interact with this medicine? Do not take this medicine with any of the following medications: -disulfiram -metronidazole This medicine may also interact with the following medications: -cyclosporine -diazepam -ketoconazole -medicines to increase blood counts like filgrastim, pegfilgrastim, sargramostim -other chemotherapy drugs like cisplatin, doxorubicin, epirubicin, etoposide, teniposide, vincristine -quinidine -testosterone -vaccines -verapamil Talk to your doctor or health care professional before taking any of these medicines: -acetaminophen -aspirin -ibuprofen -ketoprofen -naproxen This list may not describe all possible interactions. Give your health care provider a list of all the medicines, herbs, non-prescription drugs, or dietary supplements you use. Also tell them if you smoke, drink alcohol, or use illegal drugs. Some items may interact with your medicine. What should I watch for while using this medicine? Your condition will be monitored carefully while you are receiving this medicine. You will need important blood work done while you are taking this  medicine. This medicine can cause serious allergic reactions. To reduce your risk you will need to take other medicine(s) before treatment with this medicine. If you experience allergic reactions like skin rash, itching or hives, swelling of the face, lips, or tongue, tell your doctor or health care professional right away. In some cases, you may be given additional medicines to help with side effects. Follow all directions for their use. This drug may make you feel generally unwell. This is not uncommon, as chemotherapy can affect healthy cells as well as cancer cells. Report any side effects. Continue your course of treatment even though you feel ill unless your doctor tells you to stop. Call your doctor or health care professional for advice if you get a fever, chills or sore throat, or other symptoms of a cold or flu. Do not treat yourself. This drug decreases your body's ability to fight infections. Try to avoid being around people who are sick. This medicine may increase your risk to bruise or bleed. Call your doctor or health care professional if you notice any unusual bleeding. Be careful brushing and flossing your teeth or using a toothpick because you may get an infection or bleed more easily. If you have any dental work done, tell your dentist you are receiving this medicine. Avoid taking products that contain aspirin, acetaminophen, ibuprofen,  naproxen, or ketoprofen unless instructed by your doctor. These medicines may hide a fever. Do not become pregnant while taking this medicine. Women should inform their doctor if they wish to become pregnant or think they might be pregnant. There is a potential for serious side effects to an unborn child. Talk to your health care professional or pharmacist for more information. Do not breast-feed an infant while taking this medicine. Men are advised not to father a child while receiving this medicine. This product may contain alcohol. Ask your pharmacist  or healthcare provider if this medicine contains alcohol. Be sure to tell all healthcare providers you are taking this medicine. Certain medicines, like metronidazole and disulfiram, can cause an unpleasant reaction when taken with alcohol. The reaction includes flushing, headache, nausea, vomiting, sweating, and increased thirst. The reaction can last from 30 minutes to several hours. What side effects may I notice from receiving this medicine? Side effects that you should report to your doctor or health care professional as soon as possible: -allergic reactions like skin rash, itching or hives, swelling of the face, lips, or tongue -low blood counts - This drug may decrease the number of white blood cells, red blood cells and platelets. You may be at increased risk for infections and bleeding. -signs of infection - fever or chills, cough, sore throat, pain or difficulty passing urine -signs of decreased platelets or bleeding - bruising, pinpoint red spots on the skin, black, tarry stools, nosebleeds -signs of decreased red blood cells - unusually weak or tired, fainting spells, lightheadedness -breathing problems -chest pain -high or low blood pressure -mouth sores -nausea and vomiting -pain, swelling, redness or irritation at the injection site -pain, tingling, numbness in the hands or feet -slow or irregular heartbeat -swelling of the ankle, feet, hands Side effects that usually do not require medical attention (report to your doctor or health care professional if they continue or are bothersome): -bone pain -complete hair loss including hair on your head, underarms, pubic hair, eyebrows, and eyelashes -changes in the color of fingernails -diarrhea -loosening of the fingernails -loss of appetite -muscle or joint pain -red flush to skin -sweating This list may not describe all possible side effects. Call your doctor for medical advice about side effects. You may report side effects to  FDA at 1-800-FDA-1088. Where should I keep my medicine? This drug is given in a hospital or clinic and will not be stored at home. NOTE: This sheet is a summary. It may not cover all possible information. If you have questions about this medicine, talk to your doctor, pharmacist, or health care provider.  2018 Elsevier/Gold Standard (2015-04-22 19:58:00)  Carboplatin injection What is this medicine? CARBOPLATIN (KAR boe pla tin) is a chemotherapy drug. It targets fast dividing cells, like cancer cells, and causes these cells to die. This medicine is used to treat ovarian cancer and many other cancers. This medicine may be used for other purposes; ask your health care provider or pharmacist if you have questions. COMMON BRAND NAME(S): Paraplatin What should I tell my health care provider before I take this medicine? They need to know if you have any of these conditions: -blood disorders -hearing problems -kidney disease -recent or ongoing radiation therapy -an unusual or allergic reaction to carboplatin, cisplatin, other chemotherapy, other medicines, foods, dyes, or preservatives -pregnant or trying to get pregnant -breast-feeding How should I use this medicine? This drug is usually given as an infusion into a vein. It is administered in a hospital  or clinic by a specially trained health care professional. Talk to your pediatrician regarding the use of this medicine in children. Special care may be needed. Overdosage: If you think you have taken too much of this medicine contact a poison control center or emergency room at once. NOTE: This medicine is only for you. Do not share this medicine with others. What if I miss a dose? It is important not to miss a dose. Call your doctor or health care professional if you are unable to keep an appointment. What may interact with this medicine? -medicines for seizures -medicines to increase blood counts like filgrastim, pegfilgrastim,  sargramostim -some antibiotics like amikacin, gentamicin, neomycin, streptomycin, tobramycin -vaccines Talk to your doctor or health care professional before taking any of these medicines: -acetaminophen -aspirin -ibuprofen -ketoprofen -naproxen This list may not describe all possible interactions. Give your health care provider a list of all the medicines, herbs, non-prescription drugs, or dietary supplements you use. Also tell them if you smoke, drink alcohol, or use illegal drugs. Some items may interact with your medicine. What should I watch for while using this medicine? Your condition will be monitored carefully while you are receiving this medicine. You will need important blood work done while you are taking this medicine. This drug may make you feel generally unwell. This is not uncommon, as chemotherapy can affect healthy cells as well as cancer cells. Report any side effects. Continue your course of treatment even though you feel ill unless your doctor tells you to stop. In some cases, you may be given additional medicines to help with side effects. Follow all directions for their use. Call your doctor or health care professional for advice if you get a fever, chills or sore throat, or other symptoms of a cold or flu. Do not treat yourself. This drug decreases your body's ability to fight infections. Try to avoid being around people who are sick. This medicine may increase your risk to bruise or bleed. Call your doctor or health care professional if you notice any unusual bleeding. Be careful brushing and flossing your teeth or using a toothpick because you may get an infection or bleed more easily. If you have any dental work done, tell your dentist you are receiving this medicine. Avoid taking products that contain aspirin, acetaminophen, ibuprofen, naproxen, or ketoprofen unless instructed by your doctor. These medicines may hide a fever. Do not become pregnant while taking this  medicine. Women should inform their doctor if they wish to become pregnant or think they might be pregnant. There is a potential for serious side effects to an unborn child. Talk to your health care professional or pharmacist for more information. Do not breast-feed an infant while taking this medicine. What side effects may I notice from receiving this medicine? Side effects that you should report to your doctor or health care professional as soon as possible: -allergic reactions like skin rash, itching or hives, swelling of the face, lips, or tongue -signs of infection - fever or chills, cough, sore throat, pain or difficulty passing urine -signs of decreased platelets or bleeding - bruising, pinpoint red spots on the skin, black, tarry stools, nosebleeds -signs of decreased red blood cells - unusually weak or tired, fainting spells, lightheadedness -breathing problems -changes in hearing -changes in vision -chest pain -high blood pressure -low blood counts - This drug may decrease the number of white blood cells, red blood cells and platelets. You may be at increased risk for infections and bleeding. -  nausea and vomiting -pain, swelling, redness or irritation at the injection site -pain, tingling, numbness in the hands or feet -problems with balance, talking, walking -trouble passing urine or change in the amount of urine Side effects that usually do not require medical attention (report to your doctor or health care professional if they continue or are bothersome): -hair loss -loss of appetite -metallic taste in the mouth or changes in taste This list may not describe all possible side effects. Call your doctor for medical advice about side effects. You may report side effects to FDA at 1-800-FDA-1088. Where should I keep my medicine? This drug is given in a hospital or clinic and will not be stored at home. NOTE: This sheet is a summary. It may not cover all possible information. If you  have questions about this medicine, talk to your doctor, pharmacist, or health care provider.  2018 Elsevier/Gold Standard (2007-09-26 14:38:05)

## 2016-11-01 NOTE — Telephone Encounter (Signed)
[name] from [facility] claim verification called to confirm tx and dx. Information given.

## 2016-11-01 NOTE — Progress Notes (Signed)
Desk RN spoke with MD All labs, cbc and Cmet reviewed. Verbal order ok to treat with Liver functions elevated. Desk RN here to speak with pt. Pharmacy notified.

## 2016-11-02 ENCOUNTER — Ambulatory Visit: Payer: BLUE CROSS/BLUE SHIELD

## 2016-11-02 ENCOUNTER — Ambulatory Visit: Payer: BLUE CROSS/BLUE SHIELD | Admitting: Internal Medicine

## 2016-11-02 ENCOUNTER — Ambulatory Visit
Admission: RE | Admit: 2016-11-02 | Discharge: 2016-11-02 | Disposition: A | Discharge status: Home / Self Care | Payer: BLUE CROSS/BLUE SHIELD | Source: Ambulatory Visit | Attending: Radiation Oncology | Admitting: Radiation Oncology

## 2016-11-02 ENCOUNTER — Other Ambulatory Visit: Payer: BLUE CROSS/BLUE SHIELD

## 2016-11-02 DIAGNOSIS — R918 Other nonspecific abnormal finding of lung field: Secondary | ICD-10-CM | POA: Diagnosis not present

## 2016-11-03 ENCOUNTER — Ambulatory Visit
Admission: RE | Admit: 2016-11-03 | Discharge: 2016-11-03 | Disposition: A | Discharge status: Home / Self Care | Payer: BLUE CROSS/BLUE SHIELD | Source: Ambulatory Visit | Attending: Radiation Oncology | Admitting: Radiation Oncology

## 2016-11-03 DIAGNOSIS — R918 Other nonspecific abnormal finding of lung field: Secondary | ICD-10-CM | POA: Diagnosis not present

## 2016-11-04 ENCOUNTER — Ambulatory Visit
Admission: RE | Admit: 2016-11-04 | Discharge: 2016-11-04 | Disposition: A | Discharge status: Home / Self Care | Payer: BLUE CROSS/BLUE SHIELD | Source: Ambulatory Visit | Attending: Radiation Oncology | Admitting: Radiation Oncology

## 2016-11-04 DIAGNOSIS — R918 Other nonspecific abnormal finding of lung field: Secondary | ICD-10-CM | POA: Diagnosis not present

## 2016-11-05 ENCOUNTER — Ambulatory Visit
Admission: RE | Admit: 2016-11-05 | Discharge: 2016-11-05 | Disposition: A | Discharge status: Home / Self Care | Payer: BLUE CROSS/BLUE SHIELD | Source: Ambulatory Visit | Attending: Radiation Oncology | Admitting: Radiation Oncology

## 2016-11-05 ENCOUNTER — Other Ambulatory Visit: Payer: Self-pay | Admitting: Radiation Oncology

## 2016-11-05 DIAGNOSIS — R918 Other nonspecific abnormal finding of lung field: Secondary | ICD-10-CM | POA: Diagnosis not present

## 2016-11-05 MED ORDER — SUCRALFATE 1 G PO TABS: 1.0000 g | ORAL_TABLET | Freq: Four times a day (QID) | ORAL | 2 refills | Status: DC

## 2016-11-08 ENCOUNTER — Ambulatory Visit (HOSPITAL_BASED_OUTPATIENT_CLINIC_OR_DEPARTMENT_OTHER): Payer: BLUE CROSS/BLUE SHIELD

## 2016-11-08 ENCOUNTER — Ambulatory Visit
Admission: RE | Admit: 2016-11-08 | Discharge: 2016-11-08 | Disposition: A | Discharge status: Home / Self Care | Payer: BLUE CROSS/BLUE SHIELD | Source: Ambulatory Visit | Attending: Radiation Oncology | Admitting: Radiation Oncology

## 2016-11-08 VITALS — BP 134/90 | HR 87 | Temp 98.5°F | Resp 16

## 2016-11-08 DIAGNOSIS — C771 Secondary and unspecified malignant neoplasm of intrathoracic lymph nodes: Secondary | ICD-10-CM | POA: Diagnosis not present

## 2016-11-08 DIAGNOSIS — C3431 Malignant neoplasm of lower lobe, right bronchus or lung: Secondary | ICD-10-CM

## 2016-11-08 DIAGNOSIS — R918 Other nonspecific abnormal finding of lung field: Secondary | ICD-10-CM | POA: Diagnosis not present

## 2016-11-08 DIAGNOSIS — C3491 Malignant neoplasm of unspecified part of right bronchus or lung: Secondary | ICD-10-CM

## 2016-11-08 DIAGNOSIS — Z5111 Encounter for antineoplastic chemotherapy: Secondary | ICD-10-CM | POA: Diagnosis not present

## 2016-11-08 LAB — COMPREHENSIVE METABOLIC PANEL
ALT: 55 U/L (ref 0–55)
AST: 34 U/L (ref 5–34)
Albumin: 3.3 g/dL — ABNORMAL LOW (ref 3.5–5.0)
Alkaline Phosphatase: 132 U/L (ref 40–150)
Anion Gap: 10 mEq/L (ref 3–11)
BUN: 13.7 mg/dL (ref 7.0–26.0)
CO2: 24 mEq/L (ref 22–29)
Calcium: 9.2 mg/dL (ref 8.4–10.4)
Chloride: 105 mEq/L (ref 98–109)
Creatinine: 1 mg/dL (ref 0.7–1.3)
EGFR: >90 mL/min/{1.73_m2} (ref >90)
Glucose: 102 mg/dl (ref 70–140)
Potassium: 3.7 mEq/L (ref 3.5–5.1)
Sodium: 139 mEq/L (ref 136–145)
Total Bilirubin: 0.39 mg/dL (ref 0.20–1.20)
Total Protein: 7.7 g/dL (ref 6.4–8.3)

## 2016-11-08 LAB — CBC WITH DIFFERENTIAL/PLATELET
BASO%: 0.6 % (ref 0.0–2.0)
Basophils Absolute: 0 10*3/uL (ref 0.0–0.1)
EOS%: 2.8 % (ref 0.0–7.0)
Eosinophils Absolute: 0.1 10*3/uL (ref 0.0–0.5)
HCT: 36.5 % — ABNORMAL LOW (ref 38.4–49.9)
HGB: 12.3 g/dL — ABNORMAL LOW (ref 13.0–17.1)
LYMPH%: 8.7 % — ABNORMAL LOW (ref 14.0–49.0)
MCH: 31.5 pg (ref 27.2–33.4)
MCHC: 33.6 g/dL (ref 32.0–36.0)
MCV: 93.7 fL (ref 79.3–98.0)
MONO#: 0.3 10*3/uL (ref 0.1–0.9)
MONO%: 10 % (ref 0.0–14.0)
NEUT#: 2.4 10*3/uL (ref 1.5–6.5)
NEUT%: 77.9 % — ABNORMAL HIGH (ref 39.0–75.0)
Platelets: 153 10*3/uL (ref 140–400)
RBC: 3.9 10*6/uL — ABNORMAL LOW (ref 4.20–5.82)
RDW: 15.5 % — ABNORMAL HIGH (ref 11.0–14.6)
WBC: 3 10*3/uL — ABNORMAL LOW (ref 4.0–10.3)
lymph#: 0.3 10*3/uL — ABNORMAL LOW (ref 0.9–3.3)

## 2016-11-08 MED ORDER — DIPHENHYDRAMINE HCL 50 MG/ML IJ SOLN
INTRAMUSCULAR | Status: AC
  Filled 2016-11-08: qty 1

## 2016-11-08 MED ORDER — PALONOSETRON HCL INJECTION 0.25 MG/5ML
INTRAVENOUS | Status: AC
  Filled 2016-11-08: qty 5

## 2016-11-08 MED ORDER — PALONOSETRON HCL INJECTION 0.25 MG/5ML
0.2500 mg | Freq: Once | INTRAVENOUS | Status: AC
  Administered 2016-11-08: 0.25 mg via INTRAVENOUS

## 2016-11-08 MED ORDER — FAMOTIDINE IN NACL 20-0.9 MG/50ML-% IV SOLN
INTRAVENOUS | Status: AC
  Filled 2016-11-08: qty 50

## 2016-11-08 MED ORDER — SODIUM CHLORIDE 0.9 % IV SOLN
256.0000 mg | Freq: Once | INTRAVENOUS | Status: AC
  Administered 2016-11-08: 260 mg via INTRAVENOUS
  Filled 2016-11-08: qty 26

## 2016-11-08 MED ORDER — DEXTROSE 5 % IV SOLN
45.0000 mg/m2 | Freq: Once | INTRAVENOUS | Status: AC
  Administered 2016-11-08: 108 mg via INTRAVENOUS
  Filled 2016-11-08: qty 18

## 2016-11-08 MED ORDER — DIPHENHYDRAMINE HCL 50 MG/ML IJ SOLN
50.0000 mg | Freq: Once | INTRAMUSCULAR | Status: AC
  Administered 2016-11-08: 50 mg via INTRAVENOUS

## 2016-11-08 MED ORDER — SODIUM CHLORIDE 0.9 % IV SOLN
20.0000 mg | Freq: Once | INTRAVENOUS | Status: AC
  Administered 2016-11-08: 20 mg via INTRAVENOUS
  Filled 2016-11-08: qty 2

## 2016-11-08 MED ORDER — SODIUM CHLORIDE 0.9 % IV SOLN
Freq: Once | INTRAVENOUS | Status: AC
  Administered 2016-11-08: 13:00:00 via INTRAVENOUS

## 2016-11-08 MED ORDER — FAMOTIDINE IN NACL 20-0.9 MG/50ML-% IV SOLN
20.0000 mg | Freq: Once | INTRAVENOUS | Status: AC
  Administered 2016-11-08: 20 mg via INTRAVENOUS

## 2016-11-08 NOTE — Patient Instructions (Signed)
[facility] Discharge Instructions for Patients Receiving Chemotherapy  Today you received the following chemotherapy agents Taxol and Carboplatin. To help prevent nausea and vomiting after your treatment, we encourage you to take your nausea medication as directed.  If you develop nausea and vomiting that is not controlled by your nausea medication, call the clinic.   BELOW ARE SYMPTOMS THAT SHOULD BE REPORTED IMMEDIATELY:  *FEVER GREATER THAN 100.5 F  *CHILLS WITH OR WITHOUT FEVER  NAUSEA AND VOMITING THAT IS NOT CONTROLLED WITH YOUR NAUSEA MEDICATION  *UNUSUAL SHORTNESS OF BREATH  *UNUSUAL BRUISING OR BLEEDING  TENDERNESS IN MOUTH AND THROAT WITH OR WITHOUT PRESENCE OF ULCERS  *URINARY PROBLEMS  *BOWEL PROBLEMS  UNUSUAL RASH Items with * indicate a potential emergency and should be followed up as soon as possible.  Feel free to call the clinic you have any questions or concerns. The clinic phone number is [phone].  Please show the CHEMO ALERT CARD at check-in to the Emergency Department and triage nurse.    

## 2016-11-09 ENCOUNTER — Telehealth: Payer: Self-pay | Admitting: Internal Medicine

## 2016-11-09 ENCOUNTER — Ambulatory Visit: Payer: BLUE CROSS/BLUE SHIELD

## 2016-11-09 ENCOUNTER — Encounter: Payer: Self-pay | Admitting: Internal Medicine

## 2016-11-09 ENCOUNTER — Ambulatory Visit (HOSPITAL_BASED_OUTPATIENT_CLINIC_OR_DEPARTMENT_OTHER): Payer: BLUE CROSS/BLUE SHIELD | Admitting: Internal Medicine

## 2016-11-09 ENCOUNTER — Ambulatory Visit
Admission: RE | Admit: 2016-11-09 | Discharge: 2016-11-09 | Disposition: A | Discharge status: Home / Self Care | Payer: BLUE CROSS/BLUE SHIELD | Source: Ambulatory Visit | Attending: Radiation Oncology | Admitting: Radiation Oncology

## 2016-11-09 ENCOUNTER — Other Ambulatory Visit: Payer: BLUE CROSS/BLUE SHIELD

## 2016-11-09 VITALS — BP 144/84 | HR 75 | Temp 98.0°F | Resp 20 | Ht 74.0 in | Wt 237.4 lb

## 2016-11-09 DIAGNOSIS — I10 Essential (primary) hypertension: Secondary | ICD-10-CM

## 2016-11-09 DIAGNOSIS — C3431 Malignant neoplasm of lower lobe, right bronchus or lung: Secondary | ICD-10-CM | POA: Diagnosis not present

## 2016-11-09 DIAGNOSIS — C771 Secondary and unspecified malignant neoplasm of intrathoracic lymph nodes: Secondary | ICD-10-CM

## 2016-11-09 DIAGNOSIS — Z5111 Encounter for antineoplastic chemotherapy: Secondary | ICD-10-CM

## 2016-11-09 DIAGNOSIS — C3491 Malignant neoplasm of unspecified part of right bronchus or lung: Secondary | ICD-10-CM

## 2016-11-09 DIAGNOSIS — R918 Other nonspecific abnormal finding of lung field: Secondary | ICD-10-CM | POA: Diagnosis not present
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
## 2016-11-09 NOTE — Telephone Encounter (Signed)
Appts already scheduled per 5/8 los - til the end of treatment plan. - no additional appts needed.

## 2016-11-10 ENCOUNTER — Ambulatory Visit
Admission: RE | Admit: 2016-11-10 | Discharge: 2016-11-10 | Disposition: A | Discharge status: Home / Self Care | Payer: BLUE CROSS/BLUE SHIELD | Source: Ambulatory Visit | Attending: Radiation Oncology | Admitting: Radiation Oncology

## 2016-11-10 DIAGNOSIS — R918 Other nonspecific abnormal finding of lung field: Secondary | ICD-10-CM | POA: Diagnosis not present

## 2016-11-11 ENCOUNTER — Ambulatory Visit
Admission: RE | Admit: 2016-11-11 | Discharge: 2016-11-11 | Disposition: A | Discharge status: Home / Self Care | Payer: BLUE CROSS/BLUE SHIELD | Source: Ambulatory Visit | Attending: Radiation Oncology | Admitting: Radiation Oncology

## 2016-11-11 DIAGNOSIS — R918 Other nonspecific abnormal finding of lung field: Secondary | ICD-10-CM | POA: Diagnosis not present

## 2016-11-12 ENCOUNTER — Ambulatory Visit
Admission: RE | Admit: 2016-11-12 | Discharge: 2016-11-12 | Disposition: A | Discharge status: Home / Self Care | Payer: BLUE CROSS/BLUE SHIELD | Source: Ambulatory Visit | Attending: Radiation Oncology | Admitting: Radiation Oncology

## 2016-11-12 DIAGNOSIS — R918 Other nonspecific abnormal finding of lung field: Secondary | ICD-10-CM | POA: Diagnosis not present

## 2016-11-15 ENCOUNTER — Ambulatory Visit
Admission: RE | Admit: 2016-11-15 | Discharge: 2016-11-15 | Disposition: A | Discharge status: Home / Self Care | Payer: BLUE CROSS/BLUE SHIELD | Source: Ambulatory Visit | Attending: Radiation Oncology | Admitting: Radiation Oncology

## 2016-11-15 ENCOUNTER — Ambulatory Visit (HOSPITAL_BASED_OUTPATIENT_CLINIC_OR_DEPARTMENT_OTHER): Payer: BLUE CROSS/BLUE SHIELD

## 2016-11-15 ENCOUNTER — Other Ambulatory Visit (HOSPITAL_BASED_OUTPATIENT_CLINIC_OR_DEPARTMENT_OTHER): Payer: BLUE CROSS/BLUE SHIELD

## 2016-11-15 VITALS — BP 132/81 | HR 77 | Temp 97.7°F | Resp 18

## 2016-11-15 DIAGNOSIS — C3431 Malignant neoplasm of lower lobe, right bronchus or lung: Secondary | ICD-10-CM

## 2016-11-15 DIAGNOSIS — C3491 Malignant neoplasm of unspecified part of right bronchus or lung: Secondary | ICD-10-CM

## 2016-11-15 DIAGNOSIS — Z5111 Encounter for antineoplastic chemotherapy: Secondary | ICD-10-CM | POA: Diagnosis not present

## 2016-11-15 DIAGNOSIS — R918 Other nonspecific abnormal finding of lung field: Secondary | ICD-10-CM | POA: Diagnosis not present

## 2016-11-15 DIAGNOSIS — C771 Secondary and unspecified malignant neoplasm of intrathoracic lymph nodes: Secondary | ICD-10-CM

## 2016-11-15 LAB — CBC WITH DIFFERENTIAL/PLATELET
BASO%: 0.6 % (ref 0.0–2.0)
Basophils Absolute: 0 10*3/uL (ref 0.0–0.1)
EOS%: 1.5 % (ref 0.0–7.0)
Eosinophils Absolute: 0.1 10*3/uL (ref 0.0–0.5)
HCT: 37.2 % — ABNORMAL LOW (ref 38.4–49.9)
HGB: 12.5 g/dL — ABNORMAL LOW (ref 13.0–17.1)
LYMPH%: 12.6 % — ABNORMAL LOW (ref 14.0–49.0)
MCH: 31.5 pg (ref 27.2–33.4)
MCHC: 33.6 g/dL (ref 32.0–36.0)
MCV: 93.7 fL (ref 79.3–98.0)
MONO#: 0.3 10*3/uL (ref 0.1–0.9)
MONO%: 9.5 % (ref 0.0–14.0)
NEUT#: 2.5 10*3/uL (ref 1.5–6.5)
NEUT%: 75.8 % — ABNORMAL HIGH (ref 39.0–75.0)
Platelets: 115 10*3/uL — ABNORMAL LOW (ref 140–400)
RBC: 3.97 10*6/uL — ABNORMAL LOW (ref 4.20–5.82)
RDW: 15.6 % — ABNORMAL HIGH (ref 11.0–14.6)
WBC: 3.3 10*3/uL — ABNORMAL LOW (ref 4.0–10.3)
lymph#: 0.4 10*3/uL — ABNORMAL LOW (ref 0.9–3.3)

## 2016-11-15 LAB — COMPREHENSIVE METABOLIC PANEL
ALT: 22 U/L (ref 0–55)
AST: 20 U/L (ref 5–34)
Albumin: 3.4 g/dL — ABNORMAL LOW (ref 3.5–5.0)
Alkaline Phosphatase: 115 U/L (ref 40–150)
Anion Gap: 9 mEq/L (ref 3–11)
BUN: 13.6 mg/dL (ref 7.0–26.0)
CO2: 24 mEq/L (ref 22–29)
Calcium: 9.5 mg/dL (ref 8.4–10.4)
Chloride: 105 mEq/L (ref 98–109)
Creatinine: 0.9 mg/dL (ref 0.7–1.3)
EGFR: >90 mL/min/{1.73_m2} (ref >90)
Glucose: 88 mg/dl (ref 70–140)
Potassium: 4 mEq/L (ref 3.5–5.1)
Sodium: 139 mEq/L (ref 136–145)
Total Bilirubin: 0.37 mg/dL (ref 0.20–1.20)
Total Protein: 7.9 g/dL (ref 6.4–8.3)

## 2016-11-15 MED ORDER — PALONOSETRON HCL INJECTION 0.25 MG/5ML
0.2500 mg | Freq: Once | INTRAVENOUS | Status: AC
Start: 2016-11-15 — End: 2016-11-15
  Administered 2016-11-15: 0.25 mg via INTRAVENOUS

## 2016-11-15 MED ORDER — FAMOTIDINE IN NACL 20-0.9 MG/50ML-% IV SOLN
INTRAVENOUS | Status: AC
  Filled 2016-11-15: qty 50

## 2016-11-15 MED ORDER — SODIUM CHLORIDE 0.9 % IV SOLN
260.0000 mg | Freq: Once | INTRAVENOUS | Status: AC
  Administered 2016-11-15: 260 mg via INTRAVENOUS
  Filled 2016-11-15: qty 26

## 2016-11-15 MED ORDER — PALONOSETRON HCL INJECTION 0.25 MG/5ML
INTRAVENOUS | Status: AC
  Filled 2016-11-15: qty 5

## 2016-11-15 MED ORDER — SODIUM CHLORIDE 0.9 % IV SOLN
Freq: Once | INTRAVENOUS | Status: AC
  Administered 2016-11-15: 12:00:00 via INTRAVENOUS

## 2016-11-15 MED ORDER — SODIUM CHLORIDE 0.9 % IV SOLN
50.0000 mg | Freq: Once | INTRAVENOUS | Status: AC
  Administered 2016-11-15: 50 mg via INTRAVENOUS
  Filled 2016-11-15: qty 1

## 2016-11-15 MED ORDER — DEXAMETHASONE SODIUM PHOSPHATE 100 MG/10ML IJ SOLN
20.0000 mg | Freq: Once | INTRAMUSCULAR | Status: AC
  Administered 2016-11-15: 20 mg via INTRAVENOUS
  Filled 2016-11-15: qty 2

## 2016-11-15 MED ORDER — DIPHENHYDRAMINE HCL 50 MG/ML IJ SOLN: 50.0000 mg | Freq: Once | INTRAMUSCULAR | Status: DC

## 2016-11-15 MED ORDER — PACLITAXEL CHEMO INJECTION 300 MG/50ML
45.0000 mg/m2 | Freq: Once | INTRAVENOUS | Status: AC
  Administered 2016-11-15: 108 mg via INTRAVENOUS
  Filled 2016-11-15: qty 18

## 2016-11-15 MED ORDER — FAMOTIDINE IN NACL 20-0.9 MG/50ML-% IV SOLN
20.0000 mg | Freq: Once | INTRAVENOUS | Status: AC
  Administered 2016-11-15: 20 mg via INTRAVENOUS

## 2016-11-15 NOTE — Patient Instructions (Signed)
[facility] Discharge Instructions for Patients Receiving Chemotherapy  Today you received the following chemotherapy agents Taxol/Carboplatin To help prevent nausea and vomiting after your treatment, we encourage you to take your nausea medication as prescribed.   If you develop nausea and vomiting that is not controlled by your nausea medication, call the clinic.   BELOW ARE SYMPTOMS THAT SHOULD BE REPORTED IMMEDIATELY:  *FEVER GREATER THAN 100.5 F  *CHILLS WITH OR WITHOUT FEVER  NAUSEA AND VOMITING THAT IS NOT CONTROLLED WITH YOUR NAUSEA MEDICATION  *UNUSUAL SHORTNESS OF BREATH  *UNUSUAL BRUISING OR BLEEDING  TENDERNESS IN MOUTH AND THROAT WITH OR WITHOUT PRESENCE OF ULCERS  *URINARY PROBLEMS  *BOWEL PROBLEMS  UNUSUAL RASH Items with * indicate a potential emergency and should be followed up as soon as possible.  Feel free to call the clinic you have any questions or concerns. The clinic phone number is [phone].  Please show the CHEMO ALERT CARD at check-in to the Emergency Department and triage nurse.   

## 2016-11-16 ENCOUNTER — Ambulatory Visit
Admission: RE | Admit: 2016-11-16 | Discharge: 2016-11-16 | Disposition: A | Discharge status: Home / Self Care | Payer: BLUE CROSS/BLUE SHIELD | Source: Ambulatory Visit | Attending: Radiation Oncology | Admitting: Radiation Oncology

## 2016-11-16 DIAGNOSIS — R918 Other nonspecific abnormal finding of lung field: Secondary | ICD-10-CM | POA: Diagnosis not present

## 2016-11-17 ENCOUNTER — Ambulatory Visit
Admission: RE | Admit: 2016-11-17 | Discharge: 2016-11-17 | Disposition: A | Discharge status: Home / Self Care | Payer: BLUE CROSS/BLUE SHIELD | Source: Ambulatory Visit | Attending: Radiation Oncology | Admitting: Radiation Oncology

## 2016-11-17 DIAGNOSIS — R918 Other nonspecific abnormal finding of lung field: Secondary | ICD-10-CM | POA: Diagnosis not present

## 2016-11-18 ENCOUNTER — Ambulatory Visit
Admission: RE | Admit: 2016-11-18 | Discharge: 2016-11-18 | Disposition: A | Discharge status: Home / Self Care | Payer: BLUE CROSS/BLUE SHIELD | Source: Ambulatory Visit | Attending: Radiation Oncology | Admitting: Radiation Oncology

## 2016-11-18 DIAGNOSIS — R918 Other nonspecific abnormal finding of lung field: Secondary | ICD-10-CM | POA: Diagnosis not present

## 2016-11-19 ENCOUNTER — Ambulatory Visit
Admission: RE | Admit: 2016-11-19 | Discharge: 2016-11-19 | Disposition: A | Discharge status: Home / Self Care | Payer: BLUE CROSS/BLUE SHIELD | Source: Ambulatory Visit | Attending: Radiation Oncology | Admitting: Radiation Oncology

## 2016-11-19 DIAGNOSIS — R918 Other nonspecific abnormal finding of lung field: Secondary | ICD-10-CM | POA: Diagnosis not present

## 2016-11-20 ENCOUNTER — Other Ambulatory Visit: Payer: Self-pay | Admitting: Internal Medicine

## 2016-11-20 DIAGNOSIS — R918 Other nonspecific abnormal finding of lung field: Secondary | ICD-10-CM

## 2016-11-21 ENCOUNTER — Other Ambulatory Visit: Payer: Self-pay | Admitting: Hematology

## 2016-11-21 DIAGNOSIS — R918 Other nonspecific abnormal finding of lung field: Secondary | ICD-10-CM

## 2016-11-21 MED ORDER — TRAMADOL HCL 50 MG PO TABS: 50.0000 mg | ORAL_TABLET | Freq: Two times a day (BID) | ORAL | 0 refills | Status: DC

## 2016-11-21 NOTE — Telephone Encounter (Signed)
Pt called to refill his Tramadol. His regular pharmacy is closed now, refill of tramadol #30, no refill, was called in to [facility] in [place] for pt.  Truitt Merle  11/21/2016

## 2016-11-22 ENCOUNTER — Ambulatory Visit
Admission: RE | Admit: 2016-11-22 | Discharge: 2016-11-22 | Disposition: A | Discharge status: Home / Self Care | Payer: BLUE CROSS/BLUE SHIELD | Source: Ambulatory Visit | Attending: Radiation Oncology | Admitting: Radiation Oncology

## 2016-11-22 ENCOUNTER — Ambulatory Visit (HOSPITAL_BASED_OUTPATIENT_CLINIC_OR_DEPARTMENT_OTHER): Payer: BLUE CROSS/BLUE SHIELD

## 2016-11-22 ENCOUNTER — Other Ambulatory Visit (HOSPITAL_BASED_OUTPATIENT_CLINIC_OR_DEPARTMENT_OTHER): Payer: BLUE CROSS/BLUE SHIELD

## 2016-11-22 VITALS — BP 123/82 | HR 86 | Temp 98.2°F | Resp 17

## 2016-11-22 DIAGNOSIS — R918 Other nonspecific abnormal finding of lung field: Secondary | ICD-10-CM | POA: Diagnosis not present

## 2016-11-22 DIAGNOSIS — C3491 Malignant neoplasm of unspecified part of right bronchus or lung: Secondary | ICD-10-CM

## 2016-11-22 DIAGNOSIS — C771 Secondary and unspecified malignant neoplasm of intrathoracic lymph nodes: Secondary | ICD-10-CM

## 2016-11-22 DIAGNOSIS — C3431 Malignant neoplasm of lower lobe, right bronchus or lung: Secondary | ICD-10-CM | POA: Diagnosis not present

## 2016-11-22 DIAGNOSIS — Z5111 Encounter for antineoplastic chemotherapy: Secondary | ICD-10-CM

## 2016-11-22 LAB — COMPREHENSIVE METABOLIC PANEL
ALT: 25 U/L (ref 0–55)
AST: 19 U/L (ref 5–34)
Albumin: 3.4 g/dL — ABNORMAL LOW (ref 3.5–5.0)
Alkaline Phosphatase: 120 U/L (ref 40–150)
Anion Gap: 10 mEq/L (ref 3–11)
BUN: 8.6 mg/dL (ref 7.0–26.0)
CO2: 24 mEq/L (ref 22–29)
Calcium: 9.5 mg/dL (ref 8.4–10.4)
Chloride: 105 mEq/L (ref 98–109)
Creatinine: 1 mg/dL (ref 0.7–1.3)
EGFR: 90 mL/min/{1.73_m2} — ABNORMAL LOW (ref >90)
Glucose: 87 mg/dl (ref 70–140)
Potassium: 4.1 mEq/L (ref 3.5–5.1)
Sodium: 139 mEq/L (ref 136–145)
Total Bilirubin: 0.43 mg/dL (ref 0.20–1.20)
Total Protein: 7.7 g/dL (ref 6.4–8.3)

## 2016-11-22 LAB — CBC WITH DIFFERENTIAL/PLATELET
BASO%: 0.3 % (ref 0.0–2.0)
Basophils Absolute: 0 10*3/uL (ref 0.0–0.1)
EOS%: 2.4 % (ref 0.0–7.0)
Eosinophils Absolute: 0.1 10*3/uL (ref 0.0–0.5)
HCT: 38.2 % — ABNORMAL LOW (ref 38.4–49.9)
HGB: 12.8 g/dL — ABNORMAL LOW (ref 13.0–17.1)
LYMPH%: 10 % — ABNORMAL LOW (ref 14.0–49.0)
MCH: 31.6 pg (ref 27.2–33.4)
MCHC: 33.5 g/dL (ref 32.0–36.0)
MCV: 94.3 fL (ref 79.3–98.0)
MONO#: 0.5 10*3/uL (ref 0.1–0.9)
MONO%: 14.2 % — ABNORMAL HIGH (ref 0.0–14.0)
NEUT#: 2.4 10*3/uL (ref 1.5–6.5)
NEUT%: 73.1 % (ref 39.0–75.0)
Platelets: 116 10*3/uL — ABNORMAL LOW (ref 140–400)
RBC: 4.05 10*6/uL — ABNORMAL LOW (ref 4.20–5.82)
RDW: 15.1 % — ABNORMAL HIGH (ref 11.0–14.6)
WBC: 3.3 10*3/uL — ABNORMAL LOW (ref 4.0–10.3)
lymph#: 0.3 10*3/uL — ABNORMAL LOW (ref 0.9–3.3)

## 2016-11-22 MED ORDER — FAMOTIDINE IN NACL 20-0.9 MG/50ML-% IV SOLN
INTRAVENOUS | Status: AC
  Filled 2016-11-22: qty 50

## 2016-11-22 MED ORDER — DEXTROSE 5 % IV SOLN
45.0000 mg/m2 | Freq: Once | INTRAVENOUS | Status: AC
  Administered 2016-11-22: 108 mg via INTRAVENOUS
  Filled 2016-11-22: qty 18

## 2016-11-22 MED ORDER — DIPHENHYDRAMINE HCL 50 MG/ML IJ SOLN
INTRAMUSCULAR | Status: AC
  Filled 2016-11-22: qty 1

## 2016-11-22 MED ORDER — SODIUM CHLORIDE 0.9 % IV SOLN
256.0000 mg | Freq: Once | INTRAVENOUS | Status: AC
  Administered 2016-11-22: 260 mg via INTRAVENOUS
  Filled 2016-11-22: qty 26

## 2016-11-22 MED ORDER — SODIUM CHLORIDE 0.9 % IV SOLN
Freq: Once | INTRAVENOUS | Status: AC
  Administered 2016-11-22: 13:00:00 via INTRAVENOUS

## 2016-11-22 MED ORDER — PALONOSETRON HCL INJECTION 0.25 MG/5ML
INTRAVENOUS | Status: AC
  Filled 2016-11-22: qty 5

## 2016-11-22 MED ORDER — PALONOSETRON HCL INJECTION 0.25 MG/5ML
0.2500 mg | Freq: Once | INTRAVENOUS | Status: AC
  Administered 2016-11-22: 0.25 mg via INTRAVENOUS

## 2016-11-22 MED ORDER — FAMOTIDINE IN NACL 20-0.9 MG/50ML-% IV SOLN
20.0000 mg | Freq: Once | INTRAVENOUS | Status: AC
  Administered 2016-11-22: 20 mg via INTRAVENOUS

## 2016-11-22 MED ORDER — SODIUM CHLORIDE 0.9 % IV SOLN
20.0000 mg | Freq: Once | INTRAVENOUS | Status: AC
  Administered 2016-11-22: 20 mg via INTRAVENOUS
  Filled 2016-11-22: qty 2

## 2016-11-22 MED ORDER — SODIUM CHLORIDE 0.9 % IV SOLN
50.0000 mg | Freq: Once | INTRAVENOUS | Status: AC
  Administered 2016-11-22: 50 mg via INTRAVENOUS
  Filled 2016-11-22: qty 1

## 2016-11-22 NOTE — Patient Instructions (Signed)
[facility] Discharge Instructions for Patients Receiving Chemotherapy  Today you received the following chemotherapy agents Taxol and Carboplatin. To help prevent nausea and vomiting after your treatment, we encourage you to take your nausea medication as directed.  If you develop nausea and vomiting that is not controlled by your nausea medication, call the clinic.   BELOW ARE SYMPTOMS THAT SHOULD BE REPORTED IMMEDIATELY:  *FEVER GREATER THAN 100.5 F  *CHILLS WITH OR WITHOUT FEVER  NAUSEA AND VOMITING THAT IS NOT CONTROLLED WITH YOUR NAUSEA MEDICATION  *UNUSUAL SHORTNESS OF BREATH  *UNUSUAL BRUISING OR BLEEDING  TENDERNESS IN MOUTH AND THROAT WITH OR WITHOUT PRESENCE OF ULCERS  *URINARY PROBLEMS  *BOWEL PROBLEMS  UNUSUAL RASH Items with * indicate a potential emergency and should be followed up as soon as possible.  Feel free to call the clinic you have any questions or concerns. The clinic phone number is [phone].  Please show the CHEMO ALERT CARD at check-in to the Emergency Department and triage nurse.    

## 2016-11-23 ENCOUNTER — Encounter: Payer: Self-pay | Admitting: Internal Medicine

## 2016-11-23 ENCOUNTER — Ambulatory Visit
Admission: RE | Admit: 2016-11-23 | Discharge: 2016-11-23 | Disposition: A | Discharge status: Home / Self Care | Payer: BLUE CROSS/BLUE SHIELD | Source: Ambulatory Visit | Attending: Radiation Oncology | Admitting: Radiation Oncology

## 2016-11-23 ENCOUNTER — Ambulatory Visit (HOSPITAL_BASED_OUTPATIENT_CLINIC_OR_DEPARTMENT_OTHER): Payer: BLUE CROSS/BLUE SHIELD | Admitting: Internal Medicine

## 2016-11-23 ENCOUNTER — Telehealth: Payer: Self-pay | Admitting: Internal Medicine

## 2016-11-23 VITALS — BP 140/87 | HR 80 | Temp 98.0°F | Resp 18 | Ht 74.0 in | Wt 233.1 lb

## 2016-11-23 DIAGNOSIS — E119 Type 2 diabetes mellitus without complications: Secondary | ICD-10-CM

## 2016-11-23 DIAGNOSIS — C3491 Malignant neoplasm of unspecified part of right bronchus or lung: Secondary | ICD-10-CM

## 2016-11-23 DIAGNOSIS — C3431 Malignant neoplasm of lower lobe, right bronchus or lung: Secondary | ICD-10-CM

## 2016-11-23 DIAGNOSIS — R918 Other nonspecific abnormal finding of lung field: Secondary | ICD-10-CM | POA: Diagnosis not present

## 2016-11-23 DIAGNOSIS — C771 Secondary and unspecified malignant neoplasm of intrathoracic lymph nodes: Secondary | ICD-10-CM | POA: Diagnosis not present

## 2016-11-23 DIAGNOSIS — R1310 Dysphagia, unspecified: Secondary | ICD-10-CM | POA: Diagnosis not present

## 2016-11-23 DIAGNOSIS — I10 Essential (primary) hypertension: Secondary | ICD-10-CM

## 2016-11-23 DIAGNOSIS — Z5111 Encounter for antineoplastic chemotherapy: Secondary | ICD-10-CM
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
## 2016-11-23 NOTE — Telephone Encounter (Signed)
Gave patient AVS and calender per 5/22 los - central radiology to contact patient with CT schedule.

## 2016-11-24 ENCOUNTER — Ambulatory Visit
Admission: RE | Admit: 2016-11-24 | Discharge: 2016-11-24 | Disposition: A | Discharge status: Home / Self Care | Payer: BLUE CROSS/BLUE SHIELD | Source: Ambulatory Visit | Attending: Radiation Oncology | Admitting: Radiation Oncology

## 2016-11-24 DIAGNOSIS — R918 Other nonspecific abnormal finding of lung field: Secondary | ICD-10-CM | POA: Diagnosis not present

## 2016-11-25 ENCOUNTER — Ambulatory Visit
Admission: RE | Admit: 2016-11-25 | Discharge: 2016-11-25 | Disposition: A | Discharge status: Home / Self Care | Payer: BLUE CROSS/BLUE SHIELD | Source: Ambulatory Visit | Attending: Radiation Oncology | Admitting: Radiation Oncology

## 2016-11-25 DIAGNOSIS — R918 Other nonspecific abnormal finding of lung field: Secondary | ICD-10-CM | POA: Diagnosis not present

## 2016-11-26 ENCOUNTER — Ambulatory Visit
Admission: RE | Admit: 2016-11-26 | Discharge: 2016-11-26 | Disposition: A | Discharge status: Home / Self Care | Payer: BLUE CROSS/BLUE SHIELD | Source: Ambulatory Visit | Attending: Radiation Oncology | Admitting: Radiation Oncology

## 2016-11-26 ENCOUNTER — Encounter: Payer: Self-pay | Admitting: Radiation Oncology

## 2016-11-26 DIAGNOSIS — R918 Other nonspecific abnormal finding of lung field: Secondary | ICD-10-CM | POA: Diagnosis not present

## 2016-11-30 ENCOUNTER — Other Ambulatory Visit: Payer: BLUE CROSS/BLUE SHIELD

## 2016-11-30 ENCOUNTER — Ambulatory Visit: Payer: BLUE CROSS/BLUE SHIELD

## 2016-12-01 ENCOUNTER — Other Ambulatory Visit: Payer: Self-pay | Admitting: Internal Medicine

## 2016-12-01 ENCOUNTER — Ambulatory Visit: Payer: BLUE CROSS/BLUE SHIELD

## 2016-12-01 DIAGNOSIS — R918 Other nonspecific abnormal finding of lung field: Secondary | ICD-10-CM

## 2016-12-02 ENCOUNTER — Ambulatory Visit: Payer: BLUE CROSS/BLUE SHIELD

## 2016-12-02 ENCOUNTER — Encounter: Payer: Self-pay | Admitting: Medical Oncology

## 2016-12-02 NOTE — Plan of Care (Signed)
Tramadol called to pharmacy.

## 2016-12-06 ENCOUNTER — Other Ambulatory Visit: Payer: BLUE CROSS/BLUE SHIELD

## 2016-12-06 ENCOUNTER — Ambulatory Visit: Payer: BLUE CROSS/BLUE SHIELD | Admitting: Internal Medicine

## 2016-12-06 ENCOUNTER — Ambulatory Visit: Payer: BLUE CROSS/BLUE SHIELD

## 2016-12-07 NOTE — Progress Notes (Signed)
  Radiation Oncology         [phone] ________________________________  Name: [name] MRN: [id number]  Date: 11/26/2016  DOB: [birth year]  End of Treatment Note  Diagnosis:   Stage IIIA (T2a,N2, M0) NSCLC, squamous cell carcinoma, of the right lower lobe lung in addition to mediastinal and left cervical lymphadenopathy. PDL 1 expression 90%.  Indication for treatment:  Curative with concurrent chemotherapy  Radiation treatment dates:   10/18/16 - 11/26/16  Site/dose:   Right Lung: 60 Gy in 30 fractions  Beams/energy:   3D // 6X Photon  Narrative: The patient tolerated radiation treatment relatively well. The patient had difficulty swallowing and used Carafate QID for this, a loss of appetite, weight loss, a productive cough with white sputum, and dry skin in the treatment field that was itchy for which he used biafine cream.  Plan: The patient has completed radiation treatment. The patient will return to radiation oncology clinic for routine followup in one month. I advised them to call or return sooner if they have any questions or concerns related to their recovery or treatment.  ------------------------------------------------  Jodelle Gross, MD, PhD  This document serves as a record of services personally performed by Kyung Rudd, MD. It was created on his behalf by Darcus Austin, a trained medical scribe. The creation of this record is based on the scribe's personal observations and the provider's statements to them. This document has been checked and approved by the attending provider.

## 2016-12-17 ENCOUNTER — Other Ambulatory Visit (HOSPITAL_BASED_OUTPATIENT_CLINIC_OR_DEPARTMENT_OTHER): Payer: BLUE CROSS/BLUE SHIELD

## 2016-12-17 ENCOUNTER — Ambulatory Visit (HOSPITAL_COMMUNITY)
Admission: RE | Admit: 2016-12-17 | Discharge: 2016-12-17 | Disposition: A | Discharge status: Home / Self Care | Payer: BLUE CROSS/BLUE SHIELD | Source: Ambulatory Visit | Attending: Internal Medicine | Admitting: Internal Medicine

## 2016-12-17 DIAGNOSIS — R918 Other nonspecific abnormal finding of lung field: Secondary | ICD-10-CM | POA: Diagnosis not present

## 2016-12-17 DIAGNOSIS — N200 Calculus of kidney: Secondary | ICD-10-CM | POA: Diagnosis not present

## 2016-12-17 DIAGNOSIS — C3491 Malignant neoplasm of unspecified part of right bronchus or lung: Secondary | ICD-10-CM

## 2016-12-17 DIAGNOSIS — C3431 Malignant neoplasm of lower lobe, right bronchus or lung: Secondary | ICD-10-CM | POA: Insufficient documentation

## 2016-12-17 DIAGNOSIS — J439 Emphysema, unspecified: Secondary | ICD-10-CM | POA: Diagnosis not present

## 2016-12-17 DIAGNOSIS — Z5111 Encounter for antineoplastic chemotherapy: Secondary | ICD-10-CM

## 2016-12-17 DIAGNOSIS — I10 Essential (primary) hypertension: Secondary | ICD-10-CM

## 2016-12-17 DIAGNOSIS — I700 Atherosclerosis of aorta: Secondary | ICD-10-CM | POA: Insufficient documentation

## 2016-12-17 DIAGNOSIS — R590 Localized enlarged lymph nodes: Secondary | ICD-10-CM | POA: Insufficient documentation

## 2016-12-17 LAB — COMPREHENSIVE METABOLIC PANEL
ALT: 15 U/L (ref 0–55)
AST: 21 U/L (ref 5–34)
Albumin: 3.2 g/dL — ABNORMAL LOW (ref 3.5–5.0)
Alkaline Phosphatase: 134 U/L (ref 40–150)
Anion Gap: 8 mEq/L (ref 3–11)
BUN: 11.4 mg/dL (ref 7.0–26.0)
CO2: 25 mEq/L (ref 22–29)
Calcium: 9.7 mg/dL (ref 8.4–10.4)
Chloride: 105 mEq/L (ref 98–109)
Creatinine: 1.1 mg/dL (ref 0.7–1.3)
EGFR: 85 mL/min/{1.73_m2} — ABNORMAL LOW (ref >90)
Glucose: 99 mg/dl (ref 70–140)
Potassium: 4.2 mEq/L (ref 3.5–5.1)
Sodium: 138 mEq/L (ref 136–145)
Total Bilirubin: 0.45 mg/dL (ref 0.20–1.20)
Total Protein: 7.9 g/dL (ref 6.4–8.3)

## 2016-12-17 LAB — CBC WITH DIFFERENTIAL/PLATELET
BASO%: 0.8 % (ref 0.0–2.0)
Basophils Absolute: 0 10*3/uL (ref 0.0–0.1)
EOS%: 3.3 % (ref 0.0–7.0)
Eosinophils Absolute: 0.1 10*3/uL (ref 0.0–0.5)
HCT: 35.3 % — ABNORMAL LOW (ref 38.4–49.9)
HGB: 12 g/dL — ABNORMAL LOW (ref 13.0–17.1)
LYMPH%: 41.4 % (ref 14.0–49.0)
MCH: 31.4 pg (ref 27.2–33.4)
MCHC: 34 g/dL (ref 32.0–36.0)
MCV: 92.4 fL (ref 79.3–98.0)
MONO#: 0.5 10*3/uL (ref 0.1–0.9)
MONO%: 14.2 % — ABNORMAL HIGH (ref 0.0–14.0)
NEUT#: 1.5 10*3/uL (ref 1.5–6.5)
NEUT%: 40.3 % (ref 39.0–75.0)
Platelets: 148 10*3/uL (ref 140–400)
RBC: 3.82 10*6/uL — ABNORMAL LOW (ref 4.20–5.82)
RDW: 14.8 % — ABNORMAL HIGH (ref 11.0–14.6)
WBC: 3.6 10*3/uL — ABNORMAL LOW (ref 4.0–10.3)
lymph#: 1.5 10*3/uL (ref 0.9–3.3)

## 2016-12-17 MED ORDER — IOPAMIDOL (ISOVUE-300) INJECTION 61%
INTRAVENOUS | Status: AC
  Filled 2016-12-17: qty 75

## 2016-12-17 MED ORDER — IOPAMIDOL (ISOVUE-300) INJECTION 61%
75.0000 mL | Freq: Once | INTRAVENOUS | Status: AC | PRN
  Administered 2016-12-17: 75 mL via INTRAVENOUS

## 2016-12-21 ENCOUNTER — Telehealth: Payer: Self-pay | Admitting: Internal Medicine

## 2016-12-21 ENCOUNTER — Ambulatory Visit (HOSPITAL_BASED_OUTPATIENT_CLINIC_OR_DEPARTMENT_OTHER): Payer: BLUE CROSS/BLUE SHIELD | Admitting: Internal Medicine

## 2016-12-21 ENCOUNTER — Encounter: Payer: Self-pay | Admitting: Internal Medicine

## 2016-12-21 VITALS — BP 102/65 | HR 93 | Temp 98.0°F | Resp 20 | Ht 74.0 in | Wt 218.2 lb

## 2016-12-21 DIAGNOSIS — R1310 Dysphagia, unspecified: Secondary | ICD-10-CM

## 2016-12-21 DIAGNOSIS — C3431 Malignant neoplasm of lower lobe, right bronchus or lung: Secondary | ICD-10-CM | POA: Diagnosis not present

## 2016-12-21 DIAGNOSIS — C3491 Malignant neoplasm of unspecified part of right bronchus or lung: Secondary | ICD-10-CM

## 2016-12-21 DIAGNOSIS — Z5112 Encounter for antineoplastic immunotherapy: Secondary | ICD-10-CM

## 2016-12-21 DIAGNOSIS — R5382 Chronic fatigue, unspecified: Secondary | ICD-10-CM | POA: Insufficient documentation

## 2016-12-21 DIAGNOSIS — Z7189 Other specified counseling: Secondary | ICD-10-CM

## 2016-12-21 HISTORY — DX: Encounter for antineoplastic immunotherapy: Z51.12

## 2016-12-21 NOTE — Telephone Encounter (Signed)
Appointments scheduled per 12/21/16 los. Patient was given a copy of  The AVS report and appointment schedule, per 12/21/16 los.
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
## 2016-12-21 NOTE — Progress Notes (Signed)
DISCONTINUE ON PATHWAY REGIMEN - Non-Small Cell Lung     Administer weekly:     Paclitaxel      Carboplatin   **Always confirm dose/schedule in your pharmacy ordering system**    REASON: Continuation Of Treatment PRIOR TREATMENT: [id number]: Carboplatin AUC=2 + Paclitaxel 45 mg/m2 Weekly During Radiation TREATMENT RESPONSE: Partial Response (PR)  START ON PATHWAY REGIMEN - Non-Small Cell Lung     A cycle is every 14 days:     Durvalumab   **Always confirm dose/schedule in your pharmacy ordering system**    Patient Characteristics: Stage III - Unresectable, PS = 0, 1 AJCC T Category: T2a Current Disease Status: No Distant Mets or Local Recurrence AJCC N Category: N2 AJCC M Category: M0 AJCC 8 Stage Grouping: IIIA Performance Status: PS = 0, 1 Intent of Therapy: Curative Intent, Discussed with Patient

## 2016-12-23 ENCOUNTER — Other Ambulatory Visit: Payer: Self-pay | Admitting: Internal Medicine

## 2016-12-23 DIAGNOSIS — Z5111 Encounter for antineoplastic chemotherapy: Secondary | ICD-10-CM

## 2016-12-31 ENCOUNTER — Other Ambulatory Visit: Payer: Self-pay | Admitting: Medical Oncology

## 2016-12-31 ENCOUNTER — Telehealth: Payer: Self-pay

## 2016-12-31 DIAGNOSIS — R918 Other nonspecific abnormal finding of lung field: Secondary | ICD-10-CM

## 2016-12-31 MED ORDER — TRAMADOL HCL 50 MG PO TABS: 50.0000 mg | ORAL_TABLET | Freq: Two times a day (BID) | ORAL | 0 refills | Status: DC | PRN

## 2016-12-31 NOTE — Telephone Encounter (Signed)
Tramadol was phoned in to [facility] on [address]. He wanted it called into [facility] on [address] [facility] [address]. Cancelled the one and phoned in to [facility].

## 2017-01-04 ENCOUNTER — Ambulatory Visit
Admission: RE | Admit: 2017-01-04 | Discharge: 2017-01-04 | Disposition: A | Discharge status: Home / Self Care | Payer: BLUE CROSS/BLUE SHIELD | Source: Ambulatory Visit | Attending: Radiation Oncology | Admitting: Radiation Oncology

## 2017-01-04 ENCOUNTER — Encounter: Payer: Self-pay | Admitting: Radiation Oncology

## 2017-01-04 VITALS — BP 114/75 | HR 105 | Temp 97.8°F | Resp 20 | Ht 74.0 in | Wt 213.0 lb

## 2017-01-04 DIAGNOSIS — K208 Other esophagitis: Secondary | ICD-10-CM | POA: Insufficient documentation

## 2017-01-04 DIAGNOSIS — C3491 Malignant neoplasm of unspecified part of right bronchus or lung: Secondary | ICD-10-CM | POA: Diagnosis present

## 2017-01-04 DIAGNOSIS — C3431 Malignant neoplasm of lower lobe, right bronchus or lung: Secondary | ICD-10-CM | POA: Insufficient documentation

## 2017-01-04 DIAGNOSIS — R590 Localized enlarged lymph nodes: Secondary | ICD-10-CM | POA: Insufficient documentation

## 2017-01-04 DIAGNOSIS — Z7984 Long term (current) use of oral hypoglycemic drugs: Secondary | ICD-10-CM | POA: Diagnosis not present

## 2017-01-04 DIAGNOSIS — Z79899 Other long term (current) drug therapy: Secondary | ICD-10-CM | POA: Insufficient documentation

## 2017-01-04 DIAGNOSIS — Z5112 Encounter for antineoplastic immunotherapy: Secondary | ICD-10-CM | POA: Diagnosis not present

## 2017-01-04 MED ORDER — FIRST-DUKES MOUTHWASH MT SUSP
OROMUCOSAL | 0 refills | Status: DC
Start: 2017-01-04 — End: 2017-01-20

## 2017-01-05 NOTE — Progress Notes (Signed)
Radiation Oncology         [phone] ________________________________  Name: [name] MRN: [id number]  Date: 01/04/2017  DOB: [birth year]  Post Treatment Note  CC: [name], MD  Curt Bears, MD  Diagnosis:  Stage IIIA (T2a,N2, M0) NSCLC, squamous cell carcinoma, of the right lower lobe lung in addition to mediastinal and left cervical lymphadenopathy. PDL 1 expression 90%.   Interval Since Last Radiation: 5  weeks   10/18/16 - 11/26/16: Right Lung: 60 Gy in 30 fractions  Narrative:  [name] is a pleasant 61 y.o. gentleman diagnosed with NSCLC, squamous cell of the right lower lobe, and has subsequently completed concurrent chemotherapy and radiotherapy. He is now going to move forward with immunotherapy beginning Friday of this week. He tolerated radiotherapy well with moderate esophagitis. He comes today for post treatmtent follow up.   On review of systems, the patient states he is feeling well and trying to increase his oral intake. He continues though to have some discomfort in the esophagus with swallowing and reports that it feels as though he gets food stuck. He denies any sharp pain in the chest or upper neck with this, and denies any plaques of his mouth and tongue. No other complaints are verbalized.  ALLERGIES:  is allergic to ace inhibitors and lisinopril.  Meds: Current Outpatient Prescriptions  Medication Sig Dispense Refill  . allopurinol (ZYLOPRIM) 300 MG tablet Take 1 tablet (300 mg total) by mouth daily. 90 tablet 1  . amLODipine (NORVASC) 10 MG tablet Take 1 tablet (10 mg total) by mouth daily. 90 tablet 1  . Blood Glucose Monitoring Suppl (BAYER CONTOUR NEXT MONITOR) w/Device KIT Use as directed 1 kit 0  . cetirizine (ZYRTEC) 10 MG tablet Take 1 tablet (10 mg total) by mouth daily. 90 tablet 1  . COLCRYS 0.6 MG tablet Take 2 tabs (1.62m) orally at the onset of a Gout attack, may repeat (0.696m if symptoms persist. 20 tablet 0  . emollient  (BIAFINE) cream Apply 1 application topically daily.    . fluticasone (FLONASE) 50 MCG/ACT nasal spray Place 1 spray into both nostrils daily. 16 g 0  . gabapentin (NEURONTIN) 300 MG capsule Take 1 capsule (300 mg total) by mouth 2 (two) times daily. 180 capsule 1  . glucose blood (BAYER CONTOUR NEXT TEST) test strip Use as instructed 100 each 12  . metFORMIN (GLUCOPHAGE) 1000 MG tablet TAKE 1 TABLET(1000 MG) BY MOUTH TWICE DAILY WITH A MEAL 180 tablet 1  . naproxen (NAPROSYN) 500 MG tablet TAKE 1 TABLET BY MOUTH  TWICE DAILY WITH A MEAL  0  . Nicotine Polacrilex (RA NICOTINE GUM MT) Use as directed 1 each in the mouth or throat 3 times/day as needed-between meals & bedtime.     . Omega-3 Fatty Acids (FISH OIL) 1000 MG CAPS Take 1 capsule by mouth daily.    . Marland Kitchenmeprazole (PRILOSEC) 20 MG capsule Take 1 capsule (20 mg total) by mouth daily. 90 capsule 1  . sucralfate (CARAFATE) 1 g tablet Take 1 tablet (1 g total) by mouth 4 (four) times daily. 120 tablet 2  . thiamine (VITAMIN B-1) 100 MG tablet Take 100 mg by mouth daily.    . traMADol (ULTRAM) 50 MG tablet Take 1 tablet (50 mg total) by mouth 2 (two) times daily as needed. for pain 30 tablet 0  . atorvastatin (LIPITOR) 20 MG tablet Take 1 tablet (20 mg total) by mouth daily. 90 tablet 1  . Diphenhyd-Hydrocort-Nystatin (FIRST-DUKES  MOUTHWASH) SUSP Duke's Magic Mouthwash Formula:  1. Nystatin Suspension, 100,000 u/ml, 30 mL. or Nysatin Powder 3 Million Units  2. Hydrocortisone 60 mg.  3. Diphenhydramine HCL Syrup  q.s. ad. 240 ml.  4.  Lidocaine 2% viscous 240 mL 0  . Insulin Pen Needle (PEN NEEDLES) 31G X 6 MM MISC Use lantus every night at hours of sleep (Patient not taking: Reported on 01/04/2017) 50 each 3  . prochlorperazine (COMPAZINE) 10 MG tablet TAKE 1 TABLET(10 MG) BY MOUTH EVERY 6 HOURS AS NEEDED FOR NAUSEA OR VOMITING (Patient not taking: Reported on 01/04/2017) 30 tablet 0  . varenicline (CHANTIX) 0.5 MG tablet Take 1 tablet (0.5 mg  total) by mouth 2 (two) times daily. (Patient not taking: Reported on 01/04/2017) 60 tablet 0   No current facility-administered medications for this encounter.     Physical Findings:  height is '6\' 2"'  (1.88 m) and weight is 213 lb (96.6 kg). His oral temperature is 97.8 F (36.6 C). His blood pressure is 114/75 and his pulse is 105 (abnormal). His respiration is 20 and oxygen saturation is 98%.  Pain Assessment Pain Score: 5  (Low back)/10 In general this is a well appearing African American male in no acute distress. He's alert and oriented x4 and appropriate throughout the examination. Cardiopulmonary assessment is negative for acute distress and he exhibits normal effort. No evidence of thrush is noted of the buccal mucosa.   Lab Findings: Lab Results  Component Value Date   WBC 3.6 (L) 12/17/2016   HGB 12.0 (L) 12/17/2016   HCT 35.3 (L) 12/17/2016   MCV 92.4 12/17/2016   PLT 148 12/17/2016     Radiographic Findings: Ct Chest W Contrast  Result Date: 12/17/2016 CLINICAL DATA:  Stage III lung cancer, dysphagia. Chemo on scratch the chemotherapy and radiation therapy discontinued 3-4 weeks ago. EXAM: CT CHEST WITH CONTRAST TECHNIQUE: Multidetector CT imaging of the chest was performed during intravenous contrast administration. CONTRAST:  6m ISOVUE-300 IOPAMIDOL (ISOVUE-300) INJECTION 61% COMPARISON:  PET 08/23/2016 and CT chest 07/22/2016. FINDINGS: Cardiovascular: Atherosclerotic calcification of the arterial vasculature. Heart size normal. No pericardial effusion. Mediastinum/Nodes: Calcified mediastinal and right hilar lymph nodes. Noncalcified mediastinal lymph nodes measure up to 1.1 cm in the left paratracheal station, stable. Noncalcified right hilar lymph nodes measure up to 1.3 cm, previously 1.7 cm. Subcarinal adenopathy measures 2.3 cm, previously 1.8 cm. Esophagus is mildly dilated and the midportion appears slightly thickened. Tiny hiatal hernia. Lungs/Pleura: Paraseptal and  centrilobular emphysema. Cavitary mass in the right lower lobe measures 3.0 x 3.3 cm, likely stable in size, with overall decrease in associated wall thickening. Mild ground-glass in the medial right hemithorax is likely radiation related. 10 mm nodular density in the posterior segment right upper lobe (series 7, image 57) is new. No pleural fluid. Airway is unremarkable. Upper Abdomen: Visualized portions of the liver, gallbladder and adrenal glands are unremarkable. Low-attenuation lesion in the right kidney measures 3.4 cm and is likely a cyst although definitive characterization is limited without precontrast imaging. A stone is seen in the left kidney. Visualized portions of the spleen, pancreas, stomach and bowel are grossly unremarkable the exception of a tiny hiatal hernia. Upper abdominal lymph nodes are not enlarged by CT size criteria. Musculoskeletal: Degenerative changes in the spine. No worrisome lytic or sclerotic lesions. IMPRESSION: 1. Posttreatment changes in the right hemithorax. Cavitary mass in the right lower lobe is stable in size but the degree of wall thickening has decreased, indicative of positive  treatment response. 2. 10 mm nodular density in the posterior segment right upper lobe is new and may be infectious or inflammatory in etiology. Continued attention on followup exams is warranted. 3. Mixed response of mediastinal and right hilar lymph nodes. 4.  Aortic atherosclerosis (ICD10-170.0). 5.  Emphysema (ICD10-J43.9). 6. Left renal stone. Electronically Signed   By: Lorin Picket M.D.   On: 12/17/2016 13:18    Impression/Plan: 1. Stage IIIA (T2a,N2, M0) NSCLC, squamous cell carcinoma, of the right lower lobe lung in addition to mediastinal and left cervical lymphadenopathy. PDL 1 expression 90%. The patient appears to be doing well overall since completing radiation and is proceeding with immunotherapy as consolidation. He will contact us if he's not feeling any better with the  addition for MMW for his esophagitis, and as needed moving forward.  2. Radiation esophagitis. The patient will try MMW with lidocaine. He is given precautions regarding aspiration with this, and will also continue carafate. He will follow up with [place] if his symptoms do not improve in the next week or two.     Carola Rhine, PAC

## 2017-01-07 ENCOUNTER — Ambulatory Visit (HOSPITAL_BASED_OUTPATIENT_CLINIC_OR_DEPARTMENT_OTHER): Payer: BLUE CROSS/BLUE SHIELD

## 2017-01-07 ENCOUNTER — Other Ambulatory Visit (HOSPITAL_BASED_OUTPATIENT_CLINIC_OR_DEPARTMENT_OTHER): Payer: BLUE CROSS/BLUE SHIELD

## 2017-01-07 VITALS — BP 123/85 | HR 93 | Temp 98.2°F | Resp 17

## 2017-01-07 DIAGNOSIS — C3431 Malignant neoplasm of lower lobe, right bronchus or lung: Secondary | ICD-10-CM | POA: Diagnosis not present

## 2017-01-07 DIAGNOSIS — Z79899 Other long term (current) drug therapy: Secondary | ICD-10-CM | POA: Diagnosis not present

## 2017-01-07 DIAGNOSIS — R5382 Chronic fatigue, unspecified: Secondary | ICD-10-CM

## 2017-01-07 DIAGNOSIS — C771 Secondary and unspecified malignant neoplasm of intrathoracic lymph nodes: Secondary | ICD-10-CM | POA: Diagnosis not present

## 2017-01-07 DIAGNOSIS — C3491 Malignant neoplasm of unspecified part of right bronchus or lung: Secondary | ICD-10-CM

## 2017-01-07 DIAGNOSIS — Z5112 Encounter for antineoplastic immunotherapy: Secondary | ICD-10-CM

## 2017-01-07 LAB — CBC WITH DIFFERENTIAL/PLATELET
BASO%: 0.5 % (ref 0.0–2.0)
Basophils Absolute: 0 10*3/uL (ref 0.0–0.1)
EOS%: 4.5 % (ref 0.0–7.0)
Eosinophils Absolute: 0.3 10*3/uL (ref 0.0–0.5)
HCT: 38.8 % (ref 38.4–49.9)
HGB: 13 g/dL (ref 13.0–17.1)
LYMPH%: 19 % (ref 14.0–49.0)
MCH: 31 pg (ref 27.2–33.4)
MCHC: 33.5 g/dL (ref 32.0–36.0)
MCV: 92.6 fL (ref 79.3–98.0)
MONO#: 0.6 10*3/uL (ref 0.1–0.9)
MONO%: 10.4 % (ref 0.0–14.0)
NEUT#: 3.8 10*3/uL (ref 1.5–6.5)
NEUT%: 65.6 % (ref 39.0–75.0)
Platelets: 240 10*3/uL (ref 140–400)
RBC: 4.19 10*6/uL — ABNORMAL LOW (ref 4.20–5.82)
RDW: 15.7 % — ABNORMAL HIGH (ref 11.0–14.6)
WBC: 5.9 10*3/uL (ref 4.0–10.3)
lymph#: 1.1 10*3/uL (ref 0.9–3.3)

## 2017-01-07 LAB — COMPREHENSIVE METABOLIC PANEL
ALT: 14 U/L (ref 0–55)
AST: 18 U/L (ref 5–34)
Albumin: 3.1 g/dL — ABNORMAL LOW (ref 3.5–5.0)
Alkaline Phosphatase: 156 U/L — ABNORMAL HIGH (ref 40–150)
Anion Gap: 9 mEq/L (ref 3–11)
BUN: 8.2 mg/dL (ref 7.0–26.0)
CO2: 22 mEq/L (ref 22–29)
Calcium: 10.1 mg/dL (ref 8.4–10.4)
Chloride: 102 mEq/L (ref 98–109)
Creatinine: 1.1 mg/dL (ref 0.7–1.3)
EGFR: 83 mL/min/{1.73_m2} — ABNORMAL LOW (ref >90)
Glucose: 109 mg/dl (ref 70–140)
Potassium: 4 mEq/L (ref 3.5–5.1)
Sodium: 133 mEq/L — ABNORMAL LOW (ref 136–145)
Total Bilirubin: 0.56 mg/dL (ref 0.20–1.20)
Total Protein: 8.5 g/dL — ABNORMAL HIGH (ref 6.4–8.3)

## 2017-01-07 LAB — TSH: TSH: 1.147 m(IU)/L (ref 0.320–4.118)

## 2017-01-07 MED ORDER — SODIUM CHLORIDE 0.9 % IV SOLN
10.0000 mg/kg | Freq: Once | INTRAVENOUS | Status: AC
  Administered 2017-01-07: 1000 mg via INTRAVENOUS
  Filled 2017-01-07: qty 20

## 2017-01-07 MED ORDER — SODIUM CHLORIDE 0.9 % IV SOLN
Freq: Once | INTRAVENOUS | Status: AC
  Administered 2017-01-07: 15:00:00 via INTRAVENOUS

## 2017-01-07 NOTE — Patient Instructions (Addendum)
[facility] Discharge Instructions for Patients Receiving Chemotherapy  Today you received the following chemotherapy agents Imfinzi  To help prevent nausea and vomiting after your treatment, we encourage you to take your nausea medication   If you develop nausea and vomiting that is not controlled by your nausea medication, call the clinic.   BELOW ARE SYMPTOMS THAT SHOULD BE REPORTED IMMEDIATELY:  *FEVER GREATER THAN 100.5 F  *CHILLS WITH OR WITHOUT FEVER  NAUSEA AND VOMITING THAT IS NOT CONTROLLED WITH YOUR NAUSEA MEDICATION  *UNUSUAL SHORTNESS OF BREATH  *UNUSUAL BRUISING OR BLEEDING  TENDERNESS IN MOUTH AND THROAT WITH OR WITHOUT PRESENCE OF ULCERS  *URINARY PROBLEMS  *BOWEL PROBLEMS  UNUSUAL RASH Items with * indicate a potential emergency and should be followed up as soon as possible.  Feel free to call the clinic you have any questions or concerns. The clinic phone number is [phone].  Please show the [facility] at check-in to the Emergency Department and triage nurse.  \ Durvalumab injection What is this medicine? DURVALUMAB (dur VAL ue mab) is a monoclonal antibody. It is used to treat urothelial cancer. This medicine may be used for other purposes; ask your health care provider or pharmacist if you have questions. COMMON BRAND NAME(S): IMFINZI What should I tell my health care provider before I take this medicine? They need to know if you have any of these conditions: -diabetes -immune system problems -infection -inflammatory bowel disease -kidney disease -liver disease -lung or breathing disease -lupus -organ transplant -stomach or intestine problems -thyroid disease -an unusual or allergic reaction to durvalumab, other medicines, foods, dyes, or preservatives -pregnant or trying to get pregnant -breast-feeding How should I use this medicine? This medicine is for infusion into a vein. It is given by a health care  professional in a hospital or clinic setting. A special MedGuide will be given to you before each treatment. Be sure to read this information carefully each time. Talk to your pediatrician regarding the use of this medicine in children. Special care may be needed. Overdosage: If you think you have taken too much of this medicine contact a poison control center or emergency room at once. NOTE: This medicine is only for you. Do not share this medicine with others. What if I miss a dose? It is important not to miss your dose. Call your doctor or health care professional if you are unable to keep an appointment. What may interact with this medicine? Interactions have not been studied. This list may not describe all possible interactions. Give your health care provider a list of all the medicines, herbs, non-prescription drugs, or dietary supplements you use. Also tell them if you smoke, drink alcohol, or use illegal drugs. Some items may interact with your medicine. What should I watch for while using this medicine? This drug may make you feel generally unwell. Continue your course of treatment even though you feel ill unless your doctor tells you to stop. You may need blood work done while you are taking this medicine. Do not become pregnant while taking this medicine or for 3 months after stopping it. Women should inform their doctor if they wish to become pregnant or think they might be pregnant. There is a potential for serious side effects to an unborn child. Talk to your health care professional or pharmacist for more information. Do not breast-feed an infant while taking this medicine or for 3 months after stopping it. What side effects may I notice  from receiving this medicine? Side effects that you should report to your doctor or health care professional as soon as possible: -allergic reactions like skin rash, itching or hives, swelling of the face, lips, or tongue -black, tarry stools -bloody  or watery diarrhea -breathing problems -change in emotions or moods -change in sex drive -changes in vision -chest pain or chest tightness -chills -confusion -cough -facial flushing -fever -headache -signs and symptoms of high blood sugar such as dizziness; dry mouth; dry skin; fruity breath; nausea; stomach pain; increased hunger or thirst; increased urination -signs and symptoms of liver injury like dark yellow or brown urine; general ill feeling or flu-like symptoms; light-colored stools; loss of appetite; nausea; right upper belly pain; unusually weak or tired; yellowing of the eyes or skin -stomach pain -trouble passing urine or change in the amount of urine -weight gain or weight loss Side effects that usually do not require medical attention (report these to your doctor or health care professional if they continue or are bothersome): -bone pain -constipation -loss of appetite -muscle pain -nausea -swelling of the ankles, feet, hands -tiredness This list may not describe all possible side effects. Call your doctor for medical advice about side effects. You may report side effects to FDA at 1-800-FDA-1088. Where should I keep my medicine? This drug is given in a hospital or clinic and will not be stored at home. NOTE: This sheet is a summary. It may not cover all possible information. If you have questions about this medicine, talk to your doctor, pharmacist, or health care provider.  2018 Elsevier/Gold Standard (2016-01-23 15:50:36)

## 2017-01-20 ENCOUNTER — Ambulatory Visit (HOSPITAL_BASED_OUTPATIENT_CLINIC_OR_DEPARTMENT_OTHER): Payer: BLUE CROSS/BLUE SHIELD

## 2017-01-20 ENCOUNTER — Other Ambulatory Visit: Payer: Self-pay | Admitting: Internal Medicine

## 2017-01-20 ENCOUNTER — Encounter: Payer: Self-pay | Admitting: Internal Medicine

## 2017-01-20 ENCOUNTER — Telehealth: Payer: Self-pay | Admitting: Internal Medicine

## 2017-01-20 ENCOUNTER — Other Ambulatory Visit (HOSPITAL_BASED_OUTPATIENT_CLINIC_OR_DEPARTMENT_OTHER): Payer: BLUE CROSS/BLUE SHIELD

## 2017-01-20 ENCOUNTER — Ambulatory Visit (HOSPITAL_BASED_OUTPATIENT_CLINIC_OR_DEPARTMENT_OTHER): Payer: BLUE CROSS/BLUE SHIELD | Admitting: Internal Medicine

## 2017-01-20 DIAGNOSIS — C771 Secondary and unspecified malignant neoplasm of intrathoracic lymph nodes: Secondary | ICD-10-CM | POA: Diagnosis not present

## 2017-01-20 DIAGNOSIS — R918 Other nonspecific abnormal finding of lung field: Secondary | ICD-10-CM

## 2017-01-20 DIAGNOSIS — C3491 Malignant neoplasm of unspecified part of right bronchus or lung: Secondary | ICD-10-CM

## 2017-01-20 DIAGNOSIS — Z5112 Encounter for antineoplastic immunotherapy: Secondary | ICD-10-CM

## 2017-01-20 DIAGNOSIS — C3431 Malignant neoplasm of lower lobe, right bronchus or lung: Secondary | ICD-10-CM

## 2017-01-20 LAB — COMPREHENSIVE METABOLIC PANEL
ALT: 8 U/L (ref 0–55)
AST: 14 U/L (ref 5–34)
Albumin: 3 g/dL — ABNORMAL LOW (ref 3.5–5.0)
Alkaline Phosphatase: 172 U/L — ABNORMAL HIGH (ref 40–150)
Anion Gap: 11 mEq/L (ref 3–11)
BUN: 17.3 mg/dL (ref 7.0–26.0)
CO2: 24 mEq/L (ref 22–29)
Calcium: 9.8 mg/dL (ref 8.4–10.4)
Chloride: 102 mEq/L (ref 98–109)
Creatinine: 1.1 mg/dL (ref 0.7–1.3)
EGFR: 82 mL/min/{1.73_m2} — ABNORMAL LOW (ref >90)
Glucose: 93 mg/dl (ref 70–140)
Potassium: 3.6 mEq/L (ref 3.5–5.1)
Sodium: 137 mEq/L (ref 136–145)
Total Bilirubin: 0.64 mg/dL (ref 0.20–1.20)
Total Protein: 8.4 g/dL — ABNORMAL HIGH (ref 6.4–8.3)

## 2017-01-20 LAB — CBC WITH DIFFERENTIAL/PLATELET
BASO%: 0.7 % (ref 0.0–2.0)
Basophils Absolute: 0 10*3/uL (ref 0.0–0.1)
EOS%: 4.5 % (ref 0.0–7.0)
Eosinophils Absolute: 0.3 10*3/uL (ref 0.0–0.5)
HCT: 40.7 % (ref 38.4–49.9)
HGB: 13.4 g/dL (ref 13.0–17.1)
LYMPH%: 23.9 % (ref 14.0–49.0)
MCH: 30.6 pg (ref 27.2–33.4)
MCHC: 32.9 g/dL (ref 32.0–36.0)
MCV: 92.9 fL (ref 79.3–98.0)
MONO#: 0.6 10*3/uL (ref 0.1–0.9)
MONO%: 9.8 % (ref 0.0–14.0)
NEUT#: 3.6 10*3/uL (ref 1.5–6.5)
NEUT%: 61.1 % (ref 39.0–75.0)
Platelets: 240 10*3/uL (ref 140–400)
RBC: 4.38 10*6/uL (ref 4.20–5.82)
RDW: 16.3 % — ABNORMAL HIGH (ref 11.0–14.6)
WBC: 5.9 10*3/uL (ref 4.0–10.3)
lymph#: 1.4 10*3/uL (ref 0.9–3.3)

## 2017-01-20 MED ORDER — SODIUM CHLORIDE 0.9 % IV SOLN
Freq: Once | INTRAVENOUS | Status: AC
  Administered 2017-01-20: 13:00:00 via INTRAVENOUS

## 2017-01-20 MED ORDER — TRAMADOL HCL 50 MG PO TABS: 50.0000 mg | ORAL_TABLET | Freq: Two times a day (BID) | ORAL | 0 refills | Status: DC | PRN

## 2017-01-20 MED ORDER — FIRST-DUKES MOUTHWASH MT SUSP: OROMUCOSAL | 0 refills | Status: DC

## 2017-01-20 MED ORDER — SODIUM CHLORIDE 0.9% FLUSH
10.0000 mL | INTRAVENOUS | Status: DC | PRN
  Filled 2017-01-20: qty 10

## 2017-01-20 MED ORDER — SODIUM CHLORIDE 0.9 % IV SOLN
10.0000 mg/kg | Freq: Once | INTRAVENOUS | Status: AC
  Administered 2017-01-20: 1000 mg via INTRAVENOUS
  Filled 2017-01-20: qty 20

## 2017-01-20 MED ORDER — HEPARIN SOD (PORK) LOCK FLUSH 100 UNIT/ML IV SOLN
500.0000 [IU] | Freq: Once | INTRAVENOUS | Status: DC | PRN
  Filled 2017-01-20: qty 5
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
## 2017-01-20 NOTE — Patient Instructions (Signed)
[facility] Discharge Instructions for Patients Receiving Chemotherapy  Today you received the following chemotherapy agents Imfinzi  To help prevent nausea and vomiting after your treatment, we encourage you to take your nausea medication   If you develop nausea and vomiting that is not controlled by your nausea medication, call the clinic.   BELOW ARE SYMPTOMS THAT SHOULD BE REPORTED IMMEDIATELY:  *FEVER GREATER THAN 100.5 F  *CHILLS WITH OR WITHOUT FEVER  NAUSEA AND VOMITING THAT IS NOT CONTROLLED WITH YOUR NAUSEA MEDICATION  *UNUSUAL SHORTNESS OF BREATH  *UNUSUAL BRUISING OR BLEEDING  TENDERNESS IN MOUTH AND THROAT WITH OR WITHOUT PRESENCE OF ULCERS  *URINARY PROBLEMS  *BOWEL PROBLEMS  UNUSUAL RASH Items with * indicate a potential emergency and should be followed up as soon as possible.  Feel free to call the clinic you have any questions or concerns. The clinic phone number is [phone].  Please show the [place] at check-in to the Emergency Department and triage nurse.  \ Durvalumab injection What is this medicine? DURVALUMAB (dur VAL ue mab) is a monoclonal antibody. It is used to treat urothelial cancer. This medicine may be used for other purposes; ask your health care provider or pharmacist if you have questions. COMMON BRAND NAME(S): IMFINZI What should I tell my health care provider before I take this medicine? They need to know if you have any of these conditions: -diabetes -immune system problems -infection -inflammatory bowel disease -kidney disease -liver disease -lung or breathing disease -lupus -organ transplant -stomach or intestine problems -thyroid disease -an unusual or allergic reaction to durvalumab, other medicines, foods, dyes, or preservatives -pregnant or trying to get pregnant -breast-feeding How should I use this medicine? This medicine is for infusion into a vein. It is given by a health care  professional in a hospital or clinic setting. A special MedGuide will be given to you before each treatment. Be sure to read this information carefully each time. Talk to your pediatrician regarding the use of this medicine in children. Special care may be needed. Overdosage: If you think you have taken too much of this medicine contact a poison control center or emergency room at once. NOTE: This medicine is only for you. Do not share this medicine with others. What if I miss a dose? It is important not to miss your dose. Call your doctor or health care professional if you are unable to keep an appointment. What may interact with this medicine? Interactions have not been studied. This list may not describe all possible interactions. Give your health care provider a list of all the medicines, herbs, non-prescription drugs, or dietary supplements you use. Also tell them if you smoke, drink alcohol, or use illegal drugs. Some items may interact with your medicine. What should I watch for while using this medicine? This drug may make you feel generally unwell. Continue your course of treatment even though you feel ill unless your doctor tells you to stop. You may need blood work done while you are taking this medicine. Do not become pregnant while taking this medicine or for 3 months after stopping it. Women should inform their doctor if they wish to become pregnant or think they might be pregnant. There is a potential for serious side effects to an unborn child. Talk to your health care professional or pharmacist for more information. Do not breast-feed an infant while taking this medicine or for 3 months after stopping it. What side effects may I notice  from receiving this medicine? Side effects that you should report to your doctor or health care professional as soon as possible: -allergic reactions like skin rash, itching or hives, swelling of the face, lips, or tongue -black, tarry stools -bloody  or watery diarrhea -breathing problems -change in emotions or moods -change in sex drive -changes in vision -chest pain or chest tightness -chills -confusion -cough -facial flushing -fever -headache -signs and symptoms of high blood sugar such as dizziness; dry mouth; dry skin; fruity breath; nausea; stomach pain; increased hunger or thirst; increased urination -signs and symptoms of liver injury like dark yellow or brown urine; general ill feeling or flu-like symptoms; light-colored stools; loss of appetite; nausea; right upper belly pain; unusually weak or tired; yellowing of the eyes or skin -stomach pain -trouble passing urine or change in the amount of urine -weight gain or weight loss Side effects that usually do not require medical attention (report these to your doctor or health care professional if they continue or are bothersome): -bone pain -constipation -loss of appetite -muscle pain -nausea -swelling of the ankles, feet, hands -tiredness This list may not describe all possible side effects. Call your doctor for medical advice about side effects. You may report side effects to FDA at 1-800-FDA-1088. Where should I keep my medicine? This drug is given in a hospital or clinic and will not be stored at home. NOTE: This sheet is a summary. It may not cover all possible information. If you have questions about this medicine, talk to your doctor, pharmacist, or health care provider.  2018 Elsevier/Gold Standard (2016-01-23 15:50:36)

## 2017-01-20 NOTE — Telephone Encounter (Signed)
Scheduled appt per 7/19 los- Gave patient AVS and calender per los.

## 2017-02-03 ENCOUNTER — Ambulatory Visit (HOSPITAL_BASED_OUTPATIENT_CLINIC_OR_DEPARTMENT_OTHER): Payer: BLUE CROSS/BLUE SHIELD | Admitting: Internal Medicine

## 2017-02-03 ENCOUNTER — Telehealth: Payer: Self-pay | Admitting: Internal Medicine

## 2017-02-03 ENCOUNTER — Other Ambulatory Visit (HOSPITAL_BASED_OUTPATIENT_CLINIC_OR_DEPARTMENT_OTHER): Payer: BLUE CROSS/BLUE SHIELD

## 2017-02-03 ENCOUNTER — Ambulatory Visit (HOSPITAL_BASED_OUTPATIENT_CLINIC_OR_DEPARTMENT_OTHER): Payer: BLUE CROSS/BLUE SHIELD

## 2017-02-03 ENCOUNTER — Encounter: Payer: Self-pay | Admitting: Internal Medicine

## 2017-02-03 VITALS — BP 123/80 | HR 91 | Temp 98.3°F | Resp 18 | Ht 74.0 in | Wt 207.8 lb

## 2017-02-03 DIAGNOSIS — C771 Secondary and unspecified malignant neoplasm of intrathoracic lymph nodes: Secondary | ICD-10-CM

## 2017-02-03 DIAGNOSIS — Z5112 Encounter for antineoplastic immunotherapy: Secondary | ICD-10-CM

## 2017-02-03 DIAGNOSIS — R5382 Chronic fatigue, unspecified: Secondary | ICD-10-CM

## 2017-02-03 DIAGNOSIS — C3491 Malignant neoplasm of unspecified part of right bronchus or lung: Secondary | ICD-10-CM

## 2017-02-03 DIAGNOSIS — C3431 Malignant neoplasm of lower lobe, right bronchus or lung: Secondary | ICD-10-CM | POA: Diagnosis not present

## 2017-02-03 LAB — CBC WITH DIFFERENTIAL/PLATELET
BASO%: 0.9 % (ref 0.0–2.0)
Basophils Absolute: 0 10*3/uL (ref 0.0–0.1)
EOS%: 4.7 % (ref 0.0–7.0)
Eosinophils Absolute: 0.2 10*3/uL (ref 0.0–0.5)
HCT: 38.3 % — ABNORMAL LOW (ref 38.4–49.9)
HGB: 12.6 g/dL — ABNORMAL LOW (ref 13.0–17.1)
LYMPH%: 23.7 % (ref 14.0–49.0)
MCH: 30.2 pg (ref 27.2–33.4)
MCHC: 32.9 g/dL (ref 32.0–36.0)
MCV: 91.8 fL (ref 79.3–98.0)
MONO#: 0.4 10*3/uL (ref 0.1–0.9)
MONO%: 8.5 % (ref 0.0–14.0)
NEUT#: 2.9 10*3/uL (ref 1.5–6.5)
NEUT%: 62.2 % (ref 39.0–75.0)
Platelets: 220 10*3/uL (ref 140–400)
RBC: 4.17 10*6/uL — ABNORMAL LOW (ref 4.20–5.82)
RDW: 14.8 % — ABNORMAL HIGH (ref 11.0–14.6)
WBC: 4.7 10*3/uL (ref 4.0–10.3)
lymph#: 1.1 10*3/uL (ref 0.9–3.3)

## 2017-02-03 LAB — COMPREHENSIVE METABOLIC PANEL
ALT: 13 U/L (ref 0–55)
AST: 20 U/L (ref 5–34)
Albumin: 3.1 g/dL — ABNORMAL LOW (ref 3.5–5.0)
Alkaline Phosphatase: 167 U/L — ABNORMAL HIGH (ref 40–150)
Anion Gap: 10 mEq/L (ref 3–11)
BUN: 14.2 mg/dL (ref 7.0–26.0)
CO2: 22 mEq/L (ref 22–29)
Calcium: 9.9 mg/dL (ref 8.4–10.4)
Chloride: 104 mEq/L (ref 98–109)
Creatinine: 1.2 mg/dL (ref 0.7–1.3)
EGFR: 77 mL/min/{1.73_m2} — ABNORMAL LOW (ref >90)
Glucose: 119 mg/dl (ref 70–140)
Potassium: 3.7 mEq/L (ref 3.5–5.1)
Sodium: 136 mEq/L (ref 136–145)
Total Bilirubin: 0.49 mg/dL (ref 0.20–1.20)
Total Protein: 8.7 g/dL — ABNORMAL HIGH (ref 6.4–8.3)

## 2017-02-03 LAB — TSH: TSH: 0.951 m(IU)/L (ref 0.320–4.118)

## 2017-02-03 MED ORDER — SODIUM CHLORIDE 0.9 % IV SOLN
Freq: Once | INTRAVENOUS | Status: AC
Start: 2017-02-03 — End: 2017-02-03
  Administered 2017-02-03: 13:00:00 via INTRAVENOUS

## 2017-02-03 MED ORDER — SODIUM CHLORIDE 0.9 % IV SOLN
10.0000 mg/kg | Freq: Once | INTRAVENOUS | Status: AC
  Administered 2017-02-03: 1000 mg via INTRAVENOUS
  Filled 2017-02-03: qty 20

## 2017-02-03 NOTE — Patient Instructions (Signed)
[facility] Discharge Instructions for Patients Receiving Chemotherapy  Today you received the following chemotherapy agents Imfinzi To help prevent nausea and vomiting after your treatment, we encourage you to take your nausea medication as prescribed.  If you develop nausea and vomiting that is not controlled by your nausea medication, call the clinic.   BELOW ARE SYMPTOMS THAT SHOULD BE REPORTED IMMEDIATELY:  *FEVER GREATER THAN 100.5 F  *CHILLS WITH OR WITHOUT FEVER  NAUSEA AND VOMITING THAT IS NOT CONTROLLED WITH YOUR NAUSEA MEDICATION  *UNUSUAL SHORTNESS OF BREATH  *UNUSUAL BRUISING OR BLEEDING  TENDERNESS IN MOUTH AND THROAT WITH OR WITHOUT PRESENCE OF ULCERS  *URINARY PROBLEMS  *BOWEL PROBLEMS  UNUSUAL RASH Items with * indicate a potential emergency and should be followed up as soon as possible.  Feel free to call the clinic you have any questions or concerns. The clinic phone number is [phone].  Please show the [facility] at check-in to the Emergency Department and triage nurse.
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
## 2017-02-03 NOTE — Telephone Encounter (Signed)
Gave patient avs form and calendar for August and September.

## 2017-02-04 ENCOUNTER — Telehealth: Payer: Self-pay | Admitting: Internal Medicine

## 2017-02-04 NOTE — Telephone Encounter (Signed)
Per 8/2 los his next 3 rounds of infusions and appointments have been scheduled.

## 2017-02-17 ENCOUNTER — Ambulatory Visit (HOSPITAL_BASED_OUTPATIENT_CLINIC_OR_DEPARTMENT_OTHER): Payer: BLUE CROSS/BLUE SHIELD | Admitting: Oncology

## 2017-02-17 ENCOUNTER — Telehealth: Payer: Self-pay | Admitting: Oncology

## 2017-02-17 ENCOUNTER — Encounter: Payer: Self-pay | Admitting: Oncology

## 2017-02-17 ENCOUNTER — Other Ambulatory Visit (HOSPITAL_BASED_OUTPATIENT_CLINIC_OR_DEPARTMENT_OTHER): Payer: BLUE CROSS/BLUE SHIELD

## 2017-02-17 ENCOUNTER — Ambulatory Visit (HOSPITAL_BASED_OUTPATIENT_CLINIC_OR_DEPARTMENT_OTHER): Payer: BLUE CROSS/BLUE SHIELD

## 2017-02-17 VITALS — BP 121/82 | HR 91 | Temp 98.0°F | Resp 18 | Wt 212.5 lb

## 2017-02-17 DIAGNOSIS — Z5112 Encounter for antineoplastic immunotherapy: Secondary | ICD-10-CM

## 2017-02-17 DIAGNOSIS — E876 Hypokalemia: Secondary | ICD-10-CM | POA: Diagnosis not present

## 2017-02-17 DIAGNOSIS — C3491 Malignant neoplasm of unspecified part of right bronchus or lung: Secondary | ICD-10-CM

## 2017-02-17 DIAGNOSIS — C3431 Malignant neoplasm of lower lobe, right bronchus or lung: Secondary | ICD-10-CM

## 2017-02-17 DIAGNOSIS — C771 Secondary and unspecified malignant neoplasm of intrathoracic lymph nodes: Secondary | ICD-10-CM

## 2017-02-17 LAB — CBC WITH DIFFERENTIAL/PLATELET
BASO%: 0.9 % (ref 0.0–2.0)
Basophils Absolute: 0 10*3/uL (ref 0.0–0.1)
EOS%: 7.7 % — ABNORMAL HIGH (ref 0.0–7.0)
Eosinophils Absolute: 0.4 10*3/uL (ref 0.0–0.5)
HCT: 41.3 % (ref 38.4–49.9)
HGB: 13.5 g/dL (ref 13.0–17.1)
LYMPH%: 24.9 % (ref 14.0–49.0)
MCH: 29.8 pg (ref 27.2–33.4)
MCHC: 32.8 g/dL (ref 32.0–36.0)
MCV: 91 fL (ref 79.3–98.0)
MONO#: 0.4 10*3/uL (ref 0.1–0.9)
MONO%: 8.1 % (ref 0.0–14.0)
NEUT#: 2.7 10*3/uL (ref 1.5–6.5)
NEUT%: 58.4 % (ref 39.0–75.0)
Platelets: 208 10*3/uL (ref 140–400)
RBC: 4.54 10*6/uL (ref 4.20–5.82)
RDW: 16.1 % — ABNORMAL HIGH (ref 11.0–14.6)
WBC: 4.7 10*3/uL (ref 4.0–10.3)
lymph#: 1.2 10*3/uL (ref 0.9–3.3)

## 2017-02-17 LAB — COMPREHENSIVE METABOLIC PANEL
ALT: 16 U/L (ref 0–55)
AST: 23 U/L (ref 5–34)
Albumin: 3.2 g/dL — ABNORMAL LOW (ref 3.5–5.0)
Alkaline Phosphatase: 155 U/L — ABNORMAL HIGH (ref 40–150)
Anion Gap: 9 mEq/L (ref 3–11)
BUN: 14 mg/dL (ref 7.0–26.0)
CO2: 22 mEq/L (ref 22–29)
Calcium: 9.7 mg/dL (ref 8.4–10.4)
Chloride: 107 mEq/L (ref 98–109)
Creatinine: 1 mg/dL (ref 0.7–1.3)
EGFR: >90 mL/min/{1.73_m2} (ref >90)
Glucose: 142 mg/dl — ABNORMAL HIGH (ref 70–140)
Potassium: 3.3 mEq/L — ABNORMAL LOW (ref 3.5–5.1)
Sodium: 137 mEq/L (ref 136–145)
Total Bilirubin: 0.77 mg/dL (ref 0.20–1.20)
Total Protein: 8.6 g/dL — ABNORMAL HIGH (ref 6.4–8.3)

## 2017-02-17 MED ORDER — SODIUM CHLORIDE 0.9 % IV SOLN
Freq: Once | INTRAVENOUS | Status: AC
  Administered 2017-02-17: 12:00:00 via INTRAVENOUS

## 2017-02-17 MED ORDER — POTASSIUM CHLORIDE CRYS ER 20 MEQ PO TBCR: 20.0000 meq | EXTENDED_RELEASE_TABLET | Freq: Every day | ORAL | 0 refills | Status: DC

## 2017-02-17 MED ORDER — DURVALUMAB 500 MG/10ML IV SOLN
10.0000 mg/kg | Freq: Once | INTRAVENOUS | Status: AC
  Administered 2017-02-17: 1000 mg via INTRAVENOUS
  Filled 2017-02-17: qty 20

## 2017-02-17 NOTE — Patient Instructions (Signed)
[facility] Discharge Instructions for Patients Receiving Chemotherapy  Today you received the following chemotherapy agents Imfinzi  To help prevent nausea and vomiting after your treatment, we encourage you to take your nausea medication   If you develop nausea and vomiting that is not controlled by your nausea medication, call the clinic.   BELOW ARE SYMPTOMS THAT SHOULD BE REPORTED IMMEDIATELY:  *FEVER GREATER THAN 100.5 F  *CHILLS WITH OR WITHOUT FEVER  NAUSEA AND VOMITING THAT IS NOT CONTROLLED WITH YOUR NAUSEA MEDICATION  *UNUSUAL SHORTNESS OF BREATH  *UNUSUAL BRUISING OR BLEEDING  TENDERNESS IN MOUTH AND THROAT WITH OR WITHOUT PRESENCE OF ULCERS  *URINARY PROBLEMS  *BOWEL PROBLEMS  UNUSUAL RASH Items with * indicate a potential emergency and should be followed up as soon as possible.  Feel free to call the clinic you have any questions or concerns. The clinic phone number is [phone].  Please show the [facility] at check-in to the Emergency Department and triage nurse.  \ Durvalumab injection What is this medicine? DURVALUMAB (dur VAL ue mab) is a monoclonal antibody. It is used to treat urothelial cancer. This medicine may be used for other purposes; ask your health care provider or pharmacist if you have questions. COMMON BRAND NAME(S): IMFINZI What should I tell my health care provider before I take this medicine? They need to know if you have any of these conditions: -diabetes -immune system problems -infection -inflammatory bowel disease -kidney disease -liver disease -lung or breathing disease -lupus -organ transplant -stomach or intestine problems -thyroid disease -an unusual or allergic reaction to durvalumab, other medicines, foods, dyes, or preservatives -pregnant or trying to get pregnant -breast-feeding How should I use this medicine? This medicine is for infusion into a vein. It is given by a health care  professional in a hospital or clinic setting. A special MedGuide will be given to you before each treatment. Be sure to read this information carefully each time. Talk to your pediatrician regarding the use of this medicine in children. Special care may be needed. Overdosage: If you think you have taken too much of this medicine contact a poison control center or emergency room at once. NOTE: This medicine is only for you. Do not share this medicine with others. What if I miss a dose? It is important not to miss your dose. Call your doctor or health care professional if you are unable to keep an appointment. What may interact with this medicine? Interactions have not been studied. This list may not describe all possible interactions. Give your health care provider a list of all the medicines, herbs, non-prescription drugs, or dietary supplements you use. Also tell them if you smoke, drink alcohol, or use illegal drugs. Some items may interact with your medicine. What should I watch for while using this medicine? This drug may make you feel generally unwell. Continue your course of treatment even though you feel ill unless your doctor tells you to stop. You may need blood work done while you are taking this medicine. Do not become pregnant while taking this medicine or for 3 months after stopping it. Women should inform their doctor if they wish to become pregnant or think they might be pregnant. There is a potential for serious side effects to an unborn child. Talk to your health care professional or pharmacist for more information. Do not breast-feed an infant while taking this medicine or for 3 months after stopping it. What side effects may I notice  from receiving this medicine? Side effects that you should report to your doctor or health care professional as soon as possible: -allergic reactions like skin rash, itching or hives, swelling of the face, lips, or tongue -black, tarry stools -bloody  or watery diarrhea -breathing problems -change in emotions or moods -change in sex drive -changes in vision -chest pain or chest tightness -chills -confusion -cough -facial flushing -fever -headache -signs and symptoms of high blood sugar such as dizziness; dry mouth; dry skin; fruity breath; nausea; stomach pain; increased hunger or thirst; increased urination -signs and symptoms of liver injury like dark yellow or brown urine; general ill feeling or flu-like symptoms; light-colored stools; loss of appetite; nausea; right upper belly pain; unusually weak or tired; yellowing of the eyes or skin -stomach pain -trouble passing urine or change in the amount of urine -weight gain or weight loss Side effects that usually do not require medical attention (report these to your doctor or health care professional if they continue or are bothersome): -bone pain -constipation -loss of appetite -muscle pain -nausea -swelling of the ankles, feet, hands -tiredness This list may not describe all possible side effects. Call your doctor for medical advice about side effects. You may report side effects to FDA at 1-800-FDA-1088. Where should I keep my medicine? This drug is given in a hospital or clinic and will not be stored at home. NOTE: This sheet is a summary. It may not cover all possible information. If you have questions about this medicine, talk to your doctor, pharmacist, or health care provider.  2018 Elsevier/Gold Standard (2016-01-23 15:50:36)

## 2017-02-17 NOTE — Assessment & Plan Note (Signed)
This is a very pleasant 61 year old African-American male with a stage IIIa non-small cell lung cancer, squamous cell carcinoma status post course of concurrent chemoradiation with weekly carboplatin and paclitaxel for 5 cycles with partial response. He is currently undergoing treatment with consolidation immunotherapy with Imfinzi (Durvalumab) status post 4 cycles. The patient continues to tolerate his treatment well with no significant adverse effects.  The patient was seen with Dr. Julien Nordmann. Recommend for him to proceed with cycle #4 today as a scheduled. Follow-up in 2 weeks for evaluation before starting cycle #5.  For pain management, the patient will continue his current treatment with tramadol.

## 2017-02-17 NOTE — Telephone Encounter (Signed)
No los per 8/16

## 2017-02-17 NOTE — Assessment & Plan Note (Signed)
Potassium level is low today at 3.3. Prescription for K-Dur 20 milliequivalents daily or one week sent to his local pharmacy. We will recheck his potassium level at his next visit.
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
## 2017-03-03 ENCOUNTER — Encounter: Payer: Self-pay | Admitting: Internal Medicine

## 2017-03-03 ENCOUNTER — Telehealth: Payer: Self-pay | Admitting: Internal Medicine

## 2017-03-03 ENCOUNTER — Other Ambulatory Visit (HOSPITAL_BASED_OUTPATIENT_CLINIC_OR_DEPARTMENT_OTHER): Payer: BLUE CROSS/BLUE SHIELD

## 2017-03-03 ENCOUNTER — Ambulatory Visit (HOSPITAL_BASED_OUTPATIENT_CLINIC_OR_DEPARTMENT_OTHER): Payer: BLUE CROSS/BLUE SHIELD

## 2017-03-03 ENCOUNTER — Ambulatory Visit (HOSPITAL_BASED_OUTPATIENT_CLINIC_OR_DEPARTMENT_OTHER): Payer: BLUE CROSS/BLUE SHIELD | Admitting: Internal Medicine

## 2017-03-03 ENCOUNTER — Other Ambulatory Visit: Payer: Self-pay | Admitting: Internal Medicine

## 2017-03-03 VITALS — BP 110/74 | HR 88 | Temp 98.7°F | Resp 18 | Wt 217.2 lb

## 2017-03-03 DIAGNOSIS — C3431 Malignant neoplasm of lower lobe, right bronchus or lung: Secondary | ICD-10-CM

## 2017-03-03 DIAGNOSIS — I10 Essential (primary) hypertension: Secondary | ICD-10-CM

## 2017-03-03 DIAGNOSIS — R5382 Chronic fatigue, unspecified: Secondary | ICD-10-CM

## 2017-03-03 DIAGNOSIS — Z5112 Encounter for antineoplastic immunotherapy: Secondary | ICD-10-CM

## 2017-03-03 DIAGNOSIS — C771 Secondary and unspecified malignant neoplasm of intrathoracic lymph nodes: Secondary | ICD-10-CM

## 2017-03-03 DIAGNOSIS — C3491 Malignant neoplasm of unspecified part of right bronchus or lung: Secondary | ICD-10-CM

## 2017-03-03 LAB — COMPREHENSIVE METABOLIC PANEL
ALT: 17 U/L (ref 0–55)
AST: 26 U/L (ref 5–34)
Albumin: 3.2 g/dL — ABNORMAL LOW (ref 3.5–5.0)
Alkaline Phosphatase: 167 U/L — ABNORMAL HIGH (ref 40–150)
Anion Gap: 10 mEq/L (ref 3–11)
BUN: 17.6 mg/dL (ref 7.0–26.0)
CO2: 22 mEq/L (ref 22–29)
Calcium: 9.6 mg/dL (ref 8.4–10.4)
Chloride: 106 mEq/L (ref 98–109)
Creatinine: 1.2 mg/dL (ref 0.7–1.3)
EGFR: 75 mL/min/{1.73_m2} — ABNORMAL LOW (ref >90)
Glucose: 163 mg/dl — ABNORMAL HIGH (ref 70–140)
Potassium: 3.3 mEq/L — ABNORMAL LOW (ref 3.5–5.1)
Sodium: 137 mEq/L (ref 136–145)
Total Bilirubin: 0.42 mg/dL (ref 0.20–1.20)
Total Protein: 8.7 g/dL — ABNORMAL HIGH (ref 6.4–8.3)

## 2017-03-03 LAB — CBC WITH DIFFERENTIAL/PLATELET
BASO%: 1 % (ref 0.0–2.0)
Basophils Absolute: 0 10*3/uL (ref 0.0–0.1)
EOS%: 7.2 % — ABNORMAL HIGH (ref 0.0–7.0)
Eosinophils Absolute: 0.3 10*3/uL (ref 0.0–0.5)
HCT: 38 % — ABNORMAL LOW (ref 38.4–49.9)
HGB: 12.5 g/dL — ABNORMAL LOW (ref 13.0–17.1)
LYMPH%: 21.1 % (ref 14.0–49.0)
MCH: 29.7 pg (ref 27.2–33.4)
MCHC: 33 g/dL (ref 32.0–36.0)
MCV: 90.2 fL (ref 79.3–98.0)
MONO#: 0.4 10*3/uL (ref 0.1–0.9)
MONO%: 9.7 % (ref 0.0–14.0)
NEUT#: 2.7 10*3/uL (ref 1.5–6.5)
NEUT%: 61 % (ref 39.0–75.0)
Platelets: 171 10*3/uL (ref 140–400)
RBC: 4.22 10*6/uL (ref 4.20–5.82)
RDW: 16.3 % — ABNORMAL HIGH (ref 11.0–14.6)
WBC: 4.5 10*3/uL (ref 4.0–10.3)
lymph#: 0.9 10*3/uL (ref 0.9–3.3)

## 2017-03-03 LAB — TSH: TSH: 2.073 m(IU)/L (ref 0.320–4.118)

## 2017-03-03 MED ORDER — DURVALUMAB 500 MG/10ML IV SOLN
10.0000 mg/kg | Freq: Once | INTRAVENOUS | Status: AC
  Administered 2017-03-03: 1000 mg via INTRAVENOUS
  Filled 2017-03-03: qty 20

## 2017-03-03 MED ORDER — SODIUM CHLORIDE 0.9 % IV SOLN
Freq: Once | INTRAVENOUS | Status: AC
  Administered 2017-03-03: 12:00:00 via INTRAVENOUS

## 2017-03-03 NOTE — Patient Instructions (Signed)
[facility] Discharge Instructions for Patients Receiving Chemotherapy  Today you received the following chemotherapy agents imfinzi  To help prevent nausea and vomiting after your treatment, we encourage you to take your nausea medication as directed.   If you develop nausea and vomiting that is not controlled by your nausea medication, call the clinic.   BELOW ARE SYMPTOMS THAT SHOULD BE REPORTED IMMEDIATELY:  *FEVER GREATER THAN 100.5 F  *CHILLS WITH OR WITHOUT FEVER  NAUSEA AND VOMITING THAT IS NOT CONTROLLED WITH YOUR NAUSEA MEDICATION  *UNUSUAL SHORTNESS OF BREATH  *UNUSUAL BRUISING OR BLEEDING  TENDERNESS IN MOUTH AND THROAT WITH OR WITHOUT PRESENCE OF ULCERS  *URINARY PROBLEMS  *BOWEL PROBLEMS  UNUSUAL RASH Items with * indicate a potential emergency and should be followed up as soon as possible.  Feel free to call the clinic you have any questions or concerns. The clinic phone number is [phone].  Please show the [facility] at check-in to the Emergency Department and triage nurse.

## 2017-03-03 NOTE — Telephone Encounter (Signed)
Scheduled appt per 8/30 los - Gave patient AVS and calender per 8/30 los -
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
## 2017-03-12 ENCOUNTER — Emergency Department (HOSPITAL_COMMUNITY)
Admission: EM | Admit: 2017-03-12 | Discharge: 2017-03-12 | Disposition: A | Discharge status: Home / Self Care | Payer: BLUE CROSS/BLUE SHIELD | Attending: Emergency Medicine | Admitting: Emergency Medicine

## 2017-03-12 ENCOUNTER — Encounter (HOSPITAL_COMMUNITY): Payer: Self-pay

## 2017-03-12 DIAGNOSIS — R21 Rash and other nonspecific skin eruption: Secondary | ICD-10-CM | POA: Diagnosis present

## 2017-03-12 DIAGNOSIS — C3431 Malignant neoplasm of lower lobe, right bronchus or lung: Secondary | ICD-10-CM | POA: Insufficient documentation

## 2017-03-12 DIAGNOSIS — L249 Irritant contact dermatitis, unspecified cause: Secondary | ICD-10-CM | POA: Insufficient documentation

## 2017-03-12 DIAGNOSIS — Z87891 Personal history of nicotine dependence: Secondary | ICD-10-CM | POA: Diagnosis not present

## 2017-03-12 DIAGNOSIS — Z794 Long term (current) use of insulin: Secondary | ICD-10-CM | POA: Insufficient documentation

## 2017-03-12 DIAGNOSIS — Z79899 Other long term (current) drug therapy: Secondary | ICD-10-CM | POA: Insufficient documentation

## 2017-03-12 DIAGNOSIS — E119 Type 2 diabetes mellitus without complications: Secondary | ICD-10-CM | POA: Diagnosis not present

## 2017-03-12 DIAGNOSIS — I10 Essential (primary) hypertension: Secondary | ICD-10-CM | POA: Insufficient documentation

## 2017-03-12 LAB — COMPREHENSIVE METABOLIC PANEL
ALT: 20 U/L (ref 17–63)
AST: 25 U/L (ref 15–41)
Albumin: 3.5 g/dL (ref 3.5–5.0)
Alkaline Phosphatase: 166 U/L — ABNORMAL HIGH (ref 38–126)
Anion gap: 9 (ref 5–15)
BUN: 14 mg/dL (ref 6–20)
CO2: 24 mmol/L (ref 22–32)
Calcium: 9.1 mg/dL (ref 8.9–10.3)
Chloride: 100 mmol/L — ABNORMAL LOW (ref 101–111)
Creatinine, Ser: 1.07 mg/dL (ref 0.61–1.24)
GFR calc Af Amer: >60 mL/min (ref >60)
GFR calc non Af Amer: >60 mL/min (ref >60)
Glucose, Bld: 97 mg/dL (ref 65–99)
Potassium: 3.9 mmol/L (ref 3.5–5.1)
Sodium: 133 mmol/L — ABNORMAL LOW (ref 135–145)
Total Bilirubin: 0.7 mg/dL (ref 0.3–1.2)
Total Protein: 8.9 g/dL — ABNORMAL HIGH (ref 6.5–8.1)

## 2017-03-12 LAB — CBC
HCT: 37.6 % — ABNORMAL LOW (ref 39.0–52.0)
Hemoglobin: 12.9 g/dL — ABNORMAL LOW (ref 13.0–17.0)
MCH: 30.1 pg (ref 26.0–34.0)
MCHC: 34.3 g/dL (ref 30.0–36.0)
MCV: 87.6 fL (ref 78.0–100.0)
Platelets: 213 10*3/uL (ref 150–400)
RBC: 4.29 MIL/uL (ref 4.22–5.81)
RDW: 15.3 % (ref 11.5–15.5)
WBC: 5.8 10*3/uL (ref 4.0–10.5)

## 2017-03-12 MED ORDER — HYDROXYZINE HCL 25 MG PO TABS
25.0000 mg | ORAL_TABLET | Freq: Once | ORAL | Status: AC
  Administered 2017-03-12: 25 mg via ORAL
  Filled 2017-03-12: qty 1

## 2017-03-12 MED ORDER — HYDROXYZINE HCL 25 MG PO TABS: 25.0000 mg | ORAL_TABLET | Freq: Four times a day (QID) | ORAL | 0 refills | Status: DC

## 2017-03-12 MED ORDER — TRIAMCINOLONE ACETONIDE 0.1 % EX CREA: 1.0000 "application " | TOPICAL_CREAM | Freq: Two times a day (BID) | CUTANEOUS | 0 refills | Status: DC

## 2017-03-12 NOTE — Discharge Instructions (Signed)
Hydroxyzine can be taken once every 6 hours for itching. Please use caution with driving after taking this medication because it can make you sleepy. Triamcinolone cream to be applied to the rash up to 2 times per day. If you will be out in her yard doing yard work, please wear long sleeve pants because this cream can interact with sunlight and cause discoloration of the skin.  Please call Dr. Worthy Flank office on Monday morning and let him know you were seen today in the Emergency Department for a rash and let him know if it is improving with the two medications I prescribed for you.   If you develop any orsening symptoms, including a fever, if the skin gets hot or if the top layer of skin starts to heal, or if you develop any new symptoms, please return to the emergency department for reevaluation.

## 2017-03-12 NOTE — ED Notes (Signed)
Bed: WTR6 Expected date:  Expected time:  Means of arrival:  Comments: 

## 2017-03-12 NOTE — ED Triage Notes (Signed)
Pt with rash for past week.  All over arms, neck and back.  Itches.  No new meds or detergents.  No new foods.
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
## 2017-03-12 NOTE — ED Notes (Signed)
ED Provider at bedside. 

## 2017-03-14 ENCOUNTER — Telehealth: Payer: Self-pay | Admitting: Medical Oncology

## 2017-03-14 NOTE — Telephone Encounter (Signed)
Returned call . Pt treated for rash with atarax and cream, I told him these meds should not interfere with his immunotherapy.

## 2017-03-17 ENCOUNTER — Telehealth: Payer: Self-pay | Admitting: Internal Medicine

## 2017-03-17 ENCOUNTER — Encounter: Payer: Self-pay | Admitting: Internal Medicine

## 2017-03-17 ENCOUNTER — Ambulatory Visit: Payer: BLUE CROSS/BLUE SHIELD

## 2017-03-17 ENCOUNTER — Ambulatory Visit (HOSPITAL_BASED_OUTPATIENT_CLINIC_OR_DEPARTMENT_OTHER): Payer: BLUE CROSS/BLUE SHIELD | Admitting: Internal Medicine

## 2017-03-17 ENCOUNTER — Other Ambulatory Visit (HOSPITAL_BASED_OUTPATIENT_CLINIC_OR_DEPARTMENT_OTHER): Payer: BLUE CROSS/BLUE SHIELD

## 2017-03-17 VITALS — BP 117/72 | HR 98 | Temp 98.6°F | Resp 18 | Ht 74.0 in | Wt 219.7 lb

## 2017-03-17 DIAGNOSIS — Z5111 Encounter for antineoplastic chemotherapy: Secondary | ICD-10-CM

## 2017-03-17 DIAGNOSIS — C3491 Malignant neoplasm of unspecified part of right bronchus or lung: Secondary | ICD-10-CM

## 2017-03-17 DIAGNOSIS — C771 Secondary and unspecified malignant neoplasm of intrathoracic lymph nodes: Secondary | ICD-10-CM | POA: Diagnosis not present

## 2017-03-17 DIAGNOSIS — C3431 Malignant neoplasm of lower lobe, right bronchus or lung: Secondary | ICD-10-CM | POA: Diagnosis not present

## 2017-03-17 DIAGNOSIS — I10 Essential (primary) hypertension: Secondary | ICD-10-CM

## 2017-03-17 DIAGNOSIS — Z5112 Encounter for antineoplastic immunotherapy: Secondary | ICD-10-CM

## 2017-03-17 DIAGNOSIS — L270 Generalized skin eruption due to drugs and medicaments taken internally: Secondary | ICD-10-CM | POA: Diagnosis not present

## 2017-03-17 HISTORY — DX: Generalized skin eruption due to drugs and medicaments taken internally: L27.0

## 2017-03-17 LAB — CBC WITH DIFFERENTIAL/PLATELET
BASO%: 1.1 % (ref 0.0–2.0)
Basophils Absolute: 0.1 10*3/uL (ref 0.0–0.1)
EOS%: 6.4 % (ref 0.0–7.0)
Eosinophils Absolute: 0.4 10*3/uL (ref 0.0–0.5)
HCT: 36.4 % — ABNORMAL LOW (ref 38.4–49.9)
HGB: 12.1 g/dL — ABNORMAL LOW (ref 13.0–17.1)
LYMPH%: 14.4 % (ref 14.0–49.0)
MCH: 29.4 pg (ref 27.2–33.4)
MCHC: 33.3 g/dL (ref 32.0–36.0)
MCV: 88.4 fL (ref 79.3–98.0)
MONO#: 0.6 10*3/uL (ref 0.1–0.9)
MONO%: 10 % (ref 0.0–14.0)
NEUT#: 4 10*3/uL (ref 1.5–6.5)
NEUT%: 68.1 % (ref 39.0–75.0)
Platelets: 263 10*3/uL (ref 140–400)
RBC: 4.12 10*6/uL — ABNORMAL LOW (ref 4.20–5.82)
RDW: 15.9 % — ABNORMAL HIGH (ref 11.0–14.6)
WBC: 5.9 10*3/uL (ref 4.0–10.3)
lymph#: 0.8 10*3/uL — ABNORMAL LOW (ref 0.9–3.3)

## 2017-03-17 LAB — COMPREHENSIVE METABOLIC PANEL
ALT: 42 U/L (ref 0–55)
AST: 38 U/L — ABNORMAL HIGH (ref 5–34)
Albumin: 3 g/dL — ABNORMAL LOW (ref 3.5–5.0)
Alkaline Phosphatase: 261 U/L — ABNORMAL HIGH (ref 40–150)
Anion Gap: 11 mEq/L (ref 3–11)
BUN: 12 mg/dL (ref 7.0–26.0)
CO2: 20 mEq/L — ABNORMAL LOW (ref 22–29)
Calcium: 10 mg/dL (ref 8.4–10.4)
Chloride: 104 mEq/L (ref 98–109)
Creatinine: 1.1 mg/dL (ref 0.7–1.3)
EGFR: 79 mL/min/{1.73_m2} — ABNORMAL LOW (ref >90)
Glucose: 136 mg/dl (ref 70–140)
Potassium: 3.9 mEq/L (ref 3.5–5.1)
Sodium: 135 mEq/L — ABNORMAL LOW (ref 136–145)
Total Bilirubin: 0.51 mg/dL (ref 0.20–1.20)
Total Protein: 9 g/dL — ABNORMAL HIGH (ref 6.4–8.3)

## 2017-03-17 MED ORDER — PREDNISONE 20 MG PO TABS: ORAL_TABLET | ORAL | 0 refills | Status: DC

## 2017-03-17 NOTE — Telephone Encounter (Signed)
Gave avs and calendar for september
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
## 2017-03-25 ENCOUNTER — Ambulatory Visit (HOSPITAL_COMMUNITY)
Admission: RE | Admit: 2017-03-25 | Discharge: 2017-03-25 | Disposition: A | Discharge status: Home / Self Care | Payer: BLUE CROSS/BLUE SHIELD | Source: Ambulatory Visit | Attending: Internal Medicine | Admitting: Internal Medicine

## 2017-03-25 ENCOUNTER — Other Ambulatory Visit: Payer: Self-pay | Admitting: Family Medicine

## 2017-03-25 DIAGNOSIS — J90 Pleural effusion, not elsewhere classified: Secondary | ICD-10-CM | POA: Diagnosis not present

## 2017-03-25 DIAGNOSIS — N289 Disorder of kidney and ureter, unspecified: Secondary | ICD-10-CM | POA: Insufficient documentation

## 2017-03-25 DIAGNOSIS — C3491 Malignant neoplasm of unspecified part of right bronchus or lung: Secondary | ICD-10-CM | POA: Insufficient documentation

## 2017-03-25 DIAGNOSIS — Z5112 Encounter for antineoplastic immunotherapy: Secondary | ICD-10-CM | POA: Diagnosis present

## 2017-03-25 DIAGNOSIS — L270 Generalized skin eruption due to drugs and medicaments taken internally: Secondary | ICD-10-CM | POA: Diagnosis present

## 2017-03-25 DIAGNOSIS — J439 Emphysema, unspecified: Secondary | ICD-10-CM | POA: Insufficient documentation

## 2017-03-25 DIAGNOSIS — I700 Atherosclerosis of aorta: Secondary | ICD-10-CM | POA: Diagnosis not present

## 2017-03-25 DIAGNOSIS — R0981 Nasal congestion: Secondary | ICD-10-CM

## 2017-03-25 MED ORDER — IOPAMIDOL (ISOVUE-300) INJECTION 61%
INTRAVENOUS | Status: AC
  Filled 2017-03-25: qty 75

## 2017-03-25 MED ORDER — IOPAMIDOL (ISOVUE-300) INJECTION 61%
75.0000 mL | Freq: Once | INTRAVENOUS | Status: AC | PRN
  Administered 2017-03-25: 75 mL via INTRAVENOUS

## 2017-03-26 ENCOUNTER — Other Ambulatory Visit: Payer: Self-pay | Admitting: Family Medicine

## 2017-03-26 DIAGNOSIS — M1A9XX0 Chronic gout, unspecified, without tophus (tophi): Secondary | ICD-10-CM

## 2017-03-30 ENCOUNTER — Ambulatory Visit (HOSPITAL_BASED_OUTPATIENT_CLINIC_OR_DEPARTMENT_OTHER): Payer: BLUE CROSS/BLUE SHIELD | Admitting: Oncology

## 2017-03-30 ENCOUNTER — Encounter: Payer: Self-pay | Admitting: Oncology

## 2017-03-30 ENCOUNTER — Other Ambulatory Visit (HOSPITAL_BASED_OUTPATIENT_CLINIC_OR_DEPARTMENT_OTHER): Payer: BLUE CROSS/BLUE SHIELD

## 2017-03-30 ENCOUNTER — Ambulatory Visit (HOSPITAL_BASED_OUTPATIENT_CLINIC_OR_DEPARTMENT_OTHER): Payer: BLUE CROSS/BLUE SHIELD

## 2017-03-30 VITALS — BP 121/83 | HR 86 | Temp 98.1°F | Resp 18 | Ht 74.0 in | Wt 224.9 lb

## 2017-03-30 DIAGNOSIS — C3431 Malignant neoplasm of lower lobe, right bronchus or lung: Secondary | ICD-10-CM

## 2017-03-30 DIAGNOSIS — C3491 Malignant neoplasm of unspecified part of right bronchus or lung: Secondary | ICD-10-CM

## 2017-03-30 DIAGNOSIS — R21 Rash and other nonspecific skin eruption: Secondary | ICD-10-CM

## 2017-03-30 DIAGNOSIS — Z23 Encounter for immunization: Secondary | ICD-10-CM | POA: Diagnosis not present

## 2017-03-30 DIAGNOSIS — Z5111 Encounter for antineoplastic chemotherapy: Secondary | ICD-10-CM

## 2017-03-30 DIAGNOSIS — Z5112 Encounter for antineoplastic immunotherapy: Secondary | ICD-10-CM

## 2017-03-30 DIAGNOSIS — C771 Secondary and unspecified malignant neoplasm of intrathoracic lymph nodes: Secondary | ICD-10-CM | POA: Diagnosis not present

## 2017-03-30 DIAGNOSIS — L270 Generalized skin eruption due to drugs and medicaments taken internally: Secondary | ICD-10-CM

## 2017-03-30 LAB — CBC WITH DIFFERENTIAL/PLATELET
BASO%: 0.3 % (ref 0.0–2.0)
Basophils Absolute: 0 10*3/uL (ref 0.0–0.1)
EOS%: 0.3 % (ref 0.0–7.0)
Eosinophils Absolute: 0 10*3/uL (ref 0.0–0.5)
HCT: 37.8 % — ABNORMAL LOW (ref 38.4–49.9)
HGB: 12.5 g/dL — ABNORMAL LOW (ref 13.0–17.1)
LYMPH%: 14.6 % (ref 14.0–49.0)
MCH: 28.5 pg (ref 27.2–33.4)
MCHC: 33.2 g/dL (ref 32.0–36.0)
MCV: 86 fL (ref 79.3–98.0)
MONO#: 1 10*3/uL — ABNORMAL HIGH (ref 0.1–0.9)
MONO%: 9.9 % (ref 0.0–14.0)
NEUT#: 7.2 10*3/uL — ABNORMAL HIGH (ref 1.5–6.5)
NEUT%: 74.9 % (ref 39.0–75.0)
Platelets: 270 10*3/uL (ref 140–400)
RBC: 4.39 10*6/uL (ref 4.20–5.82)
RDW: 16.1 % — ABNORMAL HIGH (ref 11.0–14.6)
WBC: 9.6 10*3/uL (ref 4.0–10.3)
lymph#: 1.4 10*3/uL (ref 0.9–3.3)

## 2017-03-30 LAB — COMPREHENSIVE METABOLIC PANEL
ALT: 25 U/L (ref 0–55)
AST: 26 U/L (ref 5–34)
Albumin: 2.8 g/dL — ABNORMAL LOW (ref 3.5–5.0)
Alkaline Phosphatase: 143 U/L (ref 40–150)
Anion Gap: 10 mEq/L (ref 3–11)
BUN: 26 mg/dL (ref 7.0–26.0)
CO2: 26 mEq/L (ref 22–29)
Calcium: 9.9 mg/dL (ref 8.4–10.4)
Chloride: 103 mEq/L (ref 98–109)
Creatinine: 1.2 mg/dL (ref 0.7–1.3)
EGFR: 76 mL/min/{1.73_m2} — ABNORMAL LOW (ref >90)
Glucose: 106 mg/dl (ref 70–140)
Potassium: 3.7 mEq/L (ref 3.5–5.1)
Sodium: 139 mEq/L (ref 136–145)
Total Bilirubin: 0.35 mg/dL (ref 0.20–1.20)
Total Protein: 8.3 g/dL (ref 6.4–8.3)

## 2017-03-30 LAB — TECHNOLOGIST REVIEW

## 2017-03-30 MED ORDER — DURVALUMAB 500 MG/10ML IV SOLN
10.0000 mg/kg | Freq: Once | INTRAVENOUS | Status: AC
  Administered 2017-03-30: 1000 mg via INTRAVENOUS
  Filled 2017-03-30: qty 20

## 2017-03-30 MED ORDER — SODIUM CHLORIDE 0.9 % IV SOLN
Freq: Once | INTRAVENOUS | Status: AC
  Administered 2017-03-30: 11:00:00 via INTRAVENOUS

## 2017-03-30 MED ORDER — INFLUENZA VAC SPLIT QUAD 0.5 ML IM SUSY
0.5000 mL | PREFILLED_SYRINGE | Freq: Once | INTRAMUSCULAR | Status: AC
  Administered 2017-03-30: 0.5 mL via INTRAMUSCULAR
  Filled 2017-03-30: qty 0.5

## 2017-03-30 NOTE — Assessment & Plan Note (Signed)
This is a very pleasant 62 year old African-American male with a stage IIIa non-small cell lung cancer, squamous cell carcinoma status post course of concurrent chemoradiation with weekly carboplatin and paclitaxel for 5 cycles with partial response. He is currently undergoing treatment with consolidation immunotherapy with Imfinzi (Durvalumab) status post 5 cycles. He tolerated the last cycle of his treatment fairly well except for development of skin rash questionable to be drug-induced. Treatment was placed on hold and he was given Prednisone for his rash.   The patient was seen with Dr. Julien Nordmann. CT scan results reviewed with the patient. Explained that CT showed some lymph nodes are smaller, but other areas have enlarged. It is unclear if this represents inflammation vs. disease progression. The patient was given the option to continue treatment with Imfinzi vs. changing to systemic chemotherapy. The patient prefers to stay on [name] for now. He will proceed with cycle #6 of Imfinzi today as scheduled. Will monitor CT scan closely. Plan is to get another CT scan after 4 more cycles (CT scan to be done after cycle #9).  Patient will let us know if his rash worsens prior to his next visit.   The patient will return in 2 weeks for evaluation prior to cycle #7.  He was advised to call immediately if he has any concerning symptoms in the interval. The patient voices understanding of current disease status and treatment options and is in agreement with the current care plan. All questions were answered. The patient knows to call the clinic with any problems, questions or concerns. We can certainly see the patient much sooner if necessary.

## 2017-03-30 NOTE — Patient Instructions (Signed)
[facility] Discharge Instructions for Patients Receiving Chemotherapy  Today you received the following chemotherapy agents Imfinze  To help prevent nausea and vomiting after your treatment, we encourage you to take your nausea medication as directed.   If you develop nausea and vomiting that is not controlled by your nausea medication, call the clinic.   BELOW ARE SYMPTOMS THAT SHOULD BE REPORTED IMMEDIATELY:  *FEVER GREATER THAN 100.5 F  *CHILLS WITH OR WITHOUT FEVER  NAUSEA AND VOMITING THAT IS NOT CONTROLLED WITH YOUR NAUSEA MEDICATION  *UNUSUAL SHORTNESS OF BREATH  *UNUSUAL BRUISING OR BLEEDING  TENDERNESS IN MOUTH AND THROAT WITH OR WITHOUT PRESENCE OF ULCERS  *URINARY PROBLEMS  *BOWEL PROBLEMS  UNUSUAL RASH Items with * indicate a potential emergency and should be followed up as soon as possible.  Feel free to call the clinic should you have any questions or concerns. The clinic phone number is [phone].  Please show the [facility] at check-in to the Emergency Department and triage nurse.
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
## 2017-03-31 ENCOUNTER — Telehealth: Payer: Self-pay | Admitting: Internal Medicine

## 2017-03-31 NOTE — Telephone Encounter (Signed)
Spoke with patient regarding appts that were added per 9/26 los

## 2017-04-14 ENCOUNTER — Other Ambulatory Visit (HOSPITAL_BASED_OUTPATIENT_CLINIC_OR_DEPARTMENT_OTHER): Payer: BLUE CROSS/BLUE SHIELD

## 2017-04-14 ENCOUNTER — Encounter: Payer: Self-pay | Admitting: Internal Medicine

## 2017-04-14 ENCOUNTER — Ambulatory Visit (HOSPITAL_BASED_OUTPATIENT_CLINIC_OR_DEPARTMENT_OTHER): Payer: BLUE CROSS/BLUE SHIELD

## 2017-04-14 ENCOUNTER — Ambulatory Visit (HOSPITAL_BASED_OUTPATIENT_CLINIC_OR_DEPARTMENT_OTHER): Payer: BLUE CROSS/BLUE SHIELD | Admitting: Internal Medicine

## 2017-04-14 VITALS — BP 126/87 | HR 98 | Temp 98.4°F | Resp 18 | Ht 74.0 in | Wt 223.6 lb

## 2017-04-14 DIAGNOSIS — C3491 Malignant neoplasm of unspecified part of right bronchus or lung: Secondary | ICD-10-CM

## 2017-04-14 DIAGNOSIS — C3431 Malignant neoplasm of lower lobe, right bronchus or lung: Secondary | ICD-10-CM

## 2017-04-14 DIAGNOSIS — C771 Secondary and unspecified malignant neoplasm of intrathoracic lymph nodes: Secondary | ICD-10-CM | POA: Diagnosis not present

## 2017-04-14 DIAGNOSIS — R5382 Chronic fatigue, unspecified: Secondary | ICD-10-CM

## 2017-04-14 DIAGNOSIS — Z5112 Encounter for antineoplastic immunotherapy: Secondary | ICD-10-CM | POA: Diagnosis not present

## 2017-04-14 DIAGNOSIS — I10 Essential (primary) hypertension: Secondary | ICD-10-CM | POA: Diagnosis not present

## 2017-04-14 LAB — COMPREHENSIVE METABOLIC PANEL
ALT: 36 U/L (ref 0–55)
AST: 28 U/L (ref 5–34)
Albumin: 3.1 g/dL — ABNORMAL LOW (ref 3.5–5.0)
Alkaline Phosphatase: 289 U/L — ABNORMAL HIGH (ref 40–150)
Anion Gap: 10 mEq/L (ref 3–11)
BUN: 16.2 mg/dL (ref 7.0–26.0)
CO2: 22 mEq/L (ref 22–29)
Calcium: 9.9 mg/dL (ref 8.4–10.4)
Chloride: 107 mEq/L (ref 98–109)
Creatinine: 1.3 mg/dL (ref 0.7–1.3)
EGFR: >60 mL/min/{1.73_m2} (ref >60)
Glucose: 134 mg/dl (ref 70–140)
Potassium: 3.6 mEq/L (ref 3.5–5.1)
Sodium: 139 mEq/L (ref 136–145)
Total Bilirubin: 0.71 mg/dL (ref 0.20–1.20)
Total Protein: 9.1 g/dL — ABNORMAL HIGH (ref 6.4–8.3)

## 2017-04-14 LAB — CBC WITH DIFFERENTIAL/PLATELET
BASO%: 0.6 % (ref 0.0–2.0)
Basophils Absolute: 0 10*3/uL (ref 0.0–0.1)
EOS%: 2.2 % (ref 0.0–7.0)
Eosinophils Absolute: 0.2 10*3/uL (ref 0.0–0.5)
HCT: 36.3 % — ABNORMAL LOW (ref 38.4–49.9)
HGB: 11.9 g/dL — ABNORMAL LOW (ref 13.0–17.1)
LYMPH%: 12.5 % — ABNORMAL LOW (ref 14.0–49.0)
MCH: 28.3 pg (ref 27.2–33.4)
MCHC: 32.7 g/dL (ref 32.0–36.0)
MCV: 86.6 fL (ref 79.3–98.0)
MONO#: 0.5 10*3/uL (ref 0.1–0.9)
MONO%: 7.5 % (ref 0.0–14.0)
NEUT#: 5.7 10*3/uL (ref 1.5–6.5)
NEUT%: 77.2 % — ABNORMAL HIGH (ref 39.0–75.0)
Platelets: 254 10*3/uL (ref 140–400)
RBC: 4.19 10*6/uL — ABNORMAL LOW (ref 4.20–5.82)
RDW: 17.3 % — ABNORMAL HIGH (ref 11.0–14.6)
WBC: 7.3 10*3/uL (ref 4.0–10.3)
lymph#: 0.9 10*3/uL (ref 0.9–3.3)

## 2017-04-14 LAB — TSH: TSH: 1.645 m(IU)/L (ref 0.320–4.118)

## 2017-04-14 MED ORDER — SODIUM CHLORIDE 0.9 % IV SOLN
Freq: Once | INTRAVENOUS | Status: AC
  Administered 2017-04-14: 12:00:00 via INTRAVENOUS

## 2017-04-14 MED ORDER — SODIUM CHLORIDE 0.9 % IV SOLN
10.0000 mg/kg | Freq: Once | INTRAVENOUS | Status: AC
  Administered 2017-04-14: 1000 mg via INTRAVENOUS
  Filled 2017-04-14: qty 20

## 2017-04-14 NOTE — Patient Instructions (Signed)
[facility] Discharge Instructions for Patients Receiving Chemotherapy  Today you received the following chemotherapy agents: Imfinzi.  To help prevent nausea and vomiting after your treatment, we encourage you to take your nausea medication as directed.   If you develop nausea and vomiting that is not controlled by your nausea medication, call the clinic.   BELOW ARE SYMPTOMS THAT SHOULD BE REPORTED IMMEDIATELY:  *FEVER GREATER THAN 100.5 F  *CHILLS WITH OR WITHOUT FEVER  NAUSEA AND VOMITING THAT IS NOT CONTROLLED WITH YOUR NAUSEA MEDICATION  *UNUSUAL SHORTNESS OF BREATH  *UNUSUAL BRUISING OR BLEEDING  TENDERNESS IN MOUTH AND THROAT WITH OR WITHOUT PRESENCE OF ULCERS  *URINARY PROBLEMS  *BOWEL PROBLEMS  UNUSUAL RASH Items with * indicate a potential emergency and should be followed up as soon as possible.  Feel free to call the clinic should you have any questions or concerns. The clinic phone number is [phone].  Please show the CHEMO ALERT CARD at check-in to the Emergency Department and triage nurse.   
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
## 2017-04-27 ENCOUNTER — Other Ambulatory Visit: Payer: Self-pay | Admitting: Family Medicine

## 2017-04-27 DIAGNOSIS — K30 Functional dyspepsia: Secondary | ICD-10-CM

## 2017-04-27 DIAGNOSIS — I10 Essential (primary) hypertension: Secondary | ICD-10-CM

## 2017-04-28 ENCOUNTER — Ambulatory Visit (HOSPITAL_BASED_OUTPATIENT_CLINIC_OR_DEPARTMENT_OTHER): Payer: BLUE CROSS/BLUE SHIELD | Admitting: Oncology

## 2017-04-28 ENCOUNTER — Ambulatory Visit (HOSPITAL_BASED_OUTPATIENT_CLINIC_OR_DEPARTMENT_OTHER): Payer: BLUE CROSS/BLUE SHIELD

## 2017-04-28 ENCOUNTER — Other Ambulatory Visit (HOSPITAL_BASED_OUTPATIENT_CLINIC_OR_DEPARTMENT_OTHER): Payer: BLUE CROSS/BLUE SHIELD

## 2017-04-28 VITALS — BP 127/81 | HR 84 | Temp 97.7°F | Resp 18 | Ht 74.0 in | Wt 225.8 lb

## 2017-04-28 DIAGNOSIS — C3431 Malignant neoplasm of lower lobe, right bronchus or lung: Secondary | ICD-10-CM | POA: Diagnosis not present

## 2017-04-28 DIAGNOSIS — C771 Secondary and unspecified malignant neoplasm of intrathoracic lymph nodes: Secondary | ICD-10-CM

## 2017-04-28 DIAGNOSIS — Z5112 Encounter for antineoplastic immunotherapy: Secondary | ICD-10-CM

## 2017-04-28 DIAGNOSIS — C3491 Malignant neoplasm of unspecified part of right bronchus or lung: Secondary | ICD-10-CM

## 2017-04-28 LAB — CBC WITH DIFFERENTIAL/PLATELET
BASO%: 1 % (ref 0.0–2.0)
Basophils Absolute: 0 10*3/uL (ref 0.0–0.1)
EOS%: 3.5 % (ref 0.0–7.0)
Eosinophils Absolute: 0.2 10*3/uL (ref 0.0–0.5)
HCT: 36.4 % — ABNORMAL LOW (ref 38.4–49.9)
HGB: 12 g/dL — ABNORMAL LOW (ref 13.0–17.1)
LYMPH%: 21.5 % (ref 14.0–49.0)
MCH: 28.5 pg (ref 27.2–33.4)
MCHC: 32.9 g/dL (ref 32.0–36.0)
MCV: 86.7 fL (ref 79.3–98.0)
MONO#: 0.4 10*3/uL (ref 0.1–0.9)
MONO%: 8.7 % (ref 0.0–14.0)
NEUT#: 3.1 10*3/uL (ref 1.5–6.5)
NEUT%: 65.3 % (ref 39.0–75.0)
Platelets: 254 10*3/uL (ref 140–400)
RBC: 4.2 10*6/uL (ref 4.20–5.82)
RDW: 17.4 % — ABNORMAL HIGH (ref 11.0–14.6)
WBC: 4.8 10*3/uL (ref 4.0–10.3)
lymph#: 1 10*3/uL (ref 0.9–3.3)

## 2017-04-28 LAB — COMPREHENSIVE METABOLIC PANEL WITH GFR
ALT: 13 U/L (ref 0–55)
AST: 18 U/L (ref 5–34)
Albumin: 3.3 g/dL — ABNORMAL LOW (ref 3.5–5.0)
Alkaline Phosphatase: 149 U/L (ref 40–150)
Anion Gap: 11 meq/L (ref 3–11)
BUN: 18 mg/dL (ref 7.0–26.0)
CO2: 23 meq/L (ref 22–29)
Calcium: 9.1 mg/dL (ref 8.4–10.4)
Chloride: 105 meq/L (ref 98–109)
Creatinine: 1.1 mg/dL (ref 0.7–1.3)
EGFR: >60 ml/min/1.73 m2
Glucose: 134 mg/dL (ref 70–140)
Potassium: 3.5 meq/L (ref 3.5–5.1)
Sodium: 139 meq/L (ref 136–145)
Total Bilirubin: 0.51 mg/dL (ref 0.20–1.20)
Total Protein: 8.9 g/dL — ABNORMAL HIGH (ref 6.4–8.3)

## 2017-04-28 MED ORDER — SODIUM CHLORIDE 0.9 % IV SOLN
10.0000 mg/kg | Freq: Once | INTRAVENOUS | Status: AC
  Administered 2017-04-28: 1000 mg via INTRAVENOUS
  Filled 2017-04-28: qty 20

## 2017-04-28 MED ORDER — SODIUM CHLORIDE 0.9 % IV SOLN
Freq: Once | INTRAVENOUS | Status: AC
  Administered 2017-04-28: 11:00:00 via INTRAVENOUS

## 2017-04-28 NOTE — Patient Instructions (Signed)
[facility] Discharge Instructions for Patients Receiving Chemotherapy  Today you received the following chemotherapy agents Durvalumab  To help prevent nausea and vomiting after your treatment, we encourage you to take your nausea medication as prescribed.  If you develop nausea and vomiting that is not controlled by your nausea medication, call the clinic.   BELOW ARE SYMPTOMS THAT SHOULD BE REPORTED IMMEDIATELY:  *FEVER GREATER THAN 100.5 F  *CHILLS WITH OR WITHOUT FEVER  NAUSEA AND VOMITING THAT IS NOT CONTROLLED WITH YOUR NAUSEA MEDICATION  *UNUSUAL SHORTNESS OF BREATH  *UNUSUAL BRUISING OR BLEEDING  TENDERNESS IN MOUTH AND THROAT WITH OR WITHOUT PRESENCE OF ULCERS  *URINARY PROBLEMS  *BOWEL PROBLEMS  UNUSUAL RASH Items with * indicate a potential emergency and should be followed up as soon as possible.  Feel free to call the clinic should you have any questions or concerns. The clinic phone number is [phone].  Please show the [facility] at check-in to the Emergency Department and triage nurse.

## 2017-04-29 ENCOUNTER — Encounter: Payer: Self-pay | Admitting: Oncology

## 2017-04-29 NOTE — Assessment & Plan Note (Signed)
This is a very pleasant 61 years old African-American male with a stage IIIa non-small cell lung cancer, squamous cell carcinoma status post course of concurrent chemoradiation with weekly carboplatin and paclitaxel for 5 cycles with partial response. He is currently undergoing treatment with consolidation immunotherapy with Imfinzi (Durvalumab) status post 7 cycles. The patient continues to tolerate his treatment with Imfinzi (Durvalumab) fairly well. I recommended for him to proceed with cycle #8 today as scheduled. Follow-up visit will be in 2 weeks for evaluation before starting cycle #9.  We discussed that he can get Flonase over-the-counter to help with his congestion and tickling in his throat. He will let us know if this worsens or he develops any fevers.  The patient was advised to call immediately if he has any concerning symptoms in the interval. The patient voices understanding of current disease status and treatment options and is in agreement with the current care plan. All questions were answered. The patient knows to call the clinic with any problems, questions or concerns. We can certainly see the patient much sooner if necessary.
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
## 2017-04-30 ENCOUNTER — Other Ambulatory Visit: Payer: Self-pay | Admitting: Family Medicine

## 2017-04-30 DIAGNOSIS — E7800 Pure hypercholesterolemia, unspecified: Secondary | ICD-10-CM

## 2017-05-02 ENCOUNTER — Telehealth: Payer: Self-pay | Admitting: Oncology

## 2017-05-02 ENCOUNTER — Telehealth: Payer: Self-pay | Admitting: Internal Medicine

## 2017-05-02 NOTE — Telephone Encounter (Signed)
Diane (nurse) forwarded a message to my phone from the patient.  Called and spoke with patient on 05/02/2017 at 11:45 am regarding an extension on his FMLA.  Informed patient that we needed to receive new forms from his employer so that the doctor could fill them out with new dates.  The last forms we have approved him for FMLA through 04/01/2017.

## 2017-05-02 NOTE — Telephone Encounter (Signed)
Scheduled appt per 10/24 los - patient to get updated schedule next visit.

## 2017-05-12 ENCOUNTER — Telehealth: Payer: Self-pay | Admitting: Internal Medicine

## 2017-05-12 ENCOUNTER — Other Ambulatory Visit (HOSPITAL_BASED_OUTPATIENT_CLINIC_OR_DEPARTMENT_OTHER): Payer: BLUE CROSS/BLUE SHIELD

## 2017-05-12 ENCOUNTER — Encounter: Payer: Self-pay | Admitting: *Deleted

## 2017-05-12 ENCOUNTER — Ambulatory Visit (HOSPITAL_BASED_OUTPATIENT_CLINIC_OR_DEPARTMENT_OTHER): Payer: BLUE CROSS/BLUE SHIELD

## 2017-05-12 ENCOUNTER — Ambulatory Visit (HOSPITAL_BASED_OUTPATIENT_CLINIC_OR_DEPARTMENT_OTHER): Payer: BLUE CROSS/BLUE SHIELD | Admitting: Internal Medicine

## 2017-05-12 ENCOUNTER — Other Ambulatory Visit: Payer: Self-pay | Admitting: *Deleted

## 2017-05-12 ENCOUNTER — Encounter: Payer: Self-pay | Admitting: Internal Medicine

## 2017-05-12 VITALS — BP 141/85 | HR 85 | Temp 97.2°F | Resp 18 | Ht 74.0 in | Wt 229.5 lb

## 2017-05-12 DIAGNOSIS — C3491 Malignant neoplasm of unspecified part of right bronchus or lung: Secondary | ICD-10-CM

## 2017-05-12 DIAGNOSIS — Z79899 Other long term (current) drug therapy: Secondary | ICD-10-CM | POA: Diagnosis not present

## 2017-05-12 DIAGNOSIS — C3431 Malignant neoplasm of lower lobe, right bronchus or lung: Secondary | ICD-10-CM | POA: Diagnosis not present

## 2017-05-12 DIAGNOSIS — C771 Secondary and unspecified malignant neoplasm of intrathoracic lymph nodes: Secondary | ICD-10-CM | POA: Diagnosis not present

## 2017-05-12 DIAGNOSIS — R5382 Chronic fatigue, unspecified: Secondary | ICD-10-CM

## 2017-05-12 DIAGNOSIS — Z5112 Encounter for antineoplastic immunotherapy: Secondary | ICD-10-CM

## 2017-05-12 LAB — CBC WITH DIFFERENTIAL/PLATELET
BASO%: 3 % — ABNORMAL HIGH (ref 0.0–2.0)
Basophils Absolute: 0.1 10*3/uL (ref 0.0–0.1)
EOS%: 5.3 % (ref 0.0–7.0)
Eosinophils Absolute: 0.2 10*3/uL (ref 0.0–0.5)
HCT: 37.3 % — ABNORMAL LOW (ref 38.4–49.9)
HGB: 12.6 g/dL — ABNORMAL LOW (ref 13.0–17.1)
LYMPH%: 18.7 % (ref 14.0–49.0)
MCH: 29.6 pg (ref 27.2–33.4)
MCHC: 33.7 g/dL (ref 32.0–36.0)
MCV: 87.6 fL (ref 79.3–98.0)
MONO#: 0.4 10*3/uL (ref 0.1–0.9)
MONO%: 8.6 % (ref 0.0–14.0)
NEUT#: 3 10*3/uL (ref 1.5–6.5)
NEUT%: 64.4 % (ref 39.0–75.0)
Platelets: 214 10*3/uL (ref 140–400)
RBC: 4.26 10*6/uL (ref 4.20–5.82)
RDW: 18.5 % — ABNORMAL HIGH (ref 11.0–14.6)
WBC: 4.7 10*3/uL (ref 4.0–10.3)
lymph#: 0.9 10*3/uL (ref 0.9–3.3)

## 2017-05-12 LAB — COMPREHENSIVE METABOLIC PANEL
ALT: 11 U/L (ref 0–55)
AST: 19 U/L (ref 5–34)
Albumin: 3.3 g/dL — ABNORMAL LOW (ref 3.5–5.0)
Alkaline Phosphatase: 131 U/L (ref 40–150)
Anion Gap: 12 mEq/L — ABNORMAL HIGH (ref 3–11)
BUN: 19 mg/dL (ref 7.0–26.0)
CO2: 20 mEq/L — ABNORMAL LOW (ref 22–29)
Calcium: 9 mg/dL (ref 8.4–10.4)
Chloride: 108 mEq/L (ref 98–109)
Creatinine: 1 mg/dL (ref 0.7–1.3)
EGFR: >60 mL/min/{1.73_m2} (ref >60)
Glucose: 83 mg/dl (ref 70–140)
Potassium: 3.9 mEq/L (ref 3.5–5.1)
Sodium: 139 mEq/L (ref 136–145)
Total Bilirubin: 0.51 mg/dL (ref 0.20–1.20)
Total Protein: 8.7 g/dL — ABNORMAL HIGH (ref 6.4–8.3)

## 2017-05-12 LAB — TSH: TSH: 0.786 m(IU)/L (ref 0.320–4.118)

## 2017-05-12 MED ORDER — DURVALUMAB 500 MG/10ML IV SOLN
10.0000 mg/kg | Freq: Once | INTRAVENOUS | Status: AC
  Administered 2017-05-12: 1000 mg via INTRAVENOUS
  Filled 2017-05-12: qty 20

## 2017-05-12 MED ORDER — SODIUM CHLORIDE 0.9 % IV SOLN
Freq: Once | INTRAVENOUS | Status: AC
  Administered 2017-05-12: 11:00:00 via INTRAVENOUS

## 2017-05-12 NOTE — Patient Instructions (Signed)
[facility] Discharge Instructions for Patients Receiving Chemotherapy  Today you received the following chemotherapy agents Imfinzi  To help prevent nausea and vomiting after your treatment, we encourage you to take your nausea medication as directeed   If you develop nausea and vomiting that is not controlled by your nausea medication, call the clinic.   BELOW ARE SYMPTOMS THAT SHOULD BE REPORTED IMMEDIATELY:  *FEVER GREATER THAN 100.5 F  *CHILLS WITH OR WITHOUT FEVER  NAUSEA AND VOMITING THAT IS NOT CONTROLLED WITH YOUR NAUSEA MEDICATION  *UNUSUAL SHORTNESS OF BREATH  *UNUSUAL BRUISING OR BLEEDING  TENDERNESS IN MOUTH AND THROAT WITH OR WITHOUT PRESENCE OF ULCERS  *URINARY PROBLEMS  *BOWEL PROBLEMS  UNUSUAL RASH Items with * indicate a potential emergency and should be followed up as soon as possible.  Feel free to call the clinic should you have any questions or concerns. The clinic phone number is [phone].  Please show the [place] at check-in to the Emergency Department and triage nurse.

## 2017-05-12 NOTE — Telephone Encounter (Signed)
Scheduled appt per 11/8 los - Gave patient AVS and calender per los.
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
## 2017-05-25 ENCOUNTER — Ambulatory Visit (HOSPITAL_BASED_OUTPATIENT_CLINIC_OR_DEPARTMENT_OTHER): Payer: BLUE CROSS/BLUE SHIELD

## 2017-05-25 ENCOUNTER — Encounter: Payer: Self-pay | Admitting: Oncology

## 2017-05-25 ENCOUNTER — Other Ambulatory Visit (HOSPITAL_BASED_OUTPATIENT_CLINIC_OR_DEPARTMENT_OTHER): Payer: BLUE CROSS/BLUE SHIELD

## 2017-05-25 ENCOUNTER — Ambulatory Visit (HOSPITAL_BASED_OUTPATIENT_CLINIC_OR_DEPARTMENT_OTHER): Payer: BLUE CROSS/BLUE SHIELD | Admitting: Oncology

## 2017-05-25 VITALS — BP 128/83 | HR 95 | Temp 97.8°F | Resp 18 | Wt 225.0 lb

## 2017-05-25 DIAGNOSIS — C771 Secondary and unspecified malignant neoplasm of intrathoracic lymph nodes: Secondary | ICD-10-CM | POA: Diagnosis not present

## 2017-05-25 DIAGNOSIS — C3431 Malignant neoplasm of lower lobe, right bronchus or lung: Secondary | ICD-10-CM | POA: Diagnosis not present

## 2017-05-25 DIAGNOSIS — Z5112 Encounter for antineoplastic immunotherapy: Secondary | ICD-10-CM

## 2017-05-25 DIAGNOSIS — C3491 Malignant neoplasm of unspecified part of right bronchus or lung: Secondary | ICD-10-CM

## 2017-05-25 DIAGNOSIS — F17200 Nicotine dependence, unspecified, uncomplicated: Secondary | ICD-10-CM | POA: Diagnosis not present

## 2017-05-25 LAB — CBC WITH DIFFERENTIAL/PLATELET
BASO%: 0.9 % (ref 0.0–2.0)
Basophils Absolute: 0 10*3/uL (ref 0.0–0.1)
EOS%: 4.3 % (ref 0.0–7.0)
Eosinophils Absolute: 0.2 10*3/uL (ref 0.0–0.5)
HCT: 37.5 % — ABNORMAL LOW (ref 38.4–49.9)
HGB: 12.3 g/dL — ABNORMAL LOW (ref 13.0–17.1)
LYMPH%: 28 % (ref 14.0–49.0)
MCH: 29.2 pg (ref 27.2–33.4)
MCHC: 32.8 g/dL (ref 32.0–36.0)
MCV: 89.1 fL (ref 79.3–98.0)
MONO#: 0.4 10*3/uL (ref 0.1–0.9)
MONO%: 8.7 % (ref 0.0–14.0)
NEUT#: 2.7 10*3/uL (ref 1.5–6.5)
NEUT%: 58.1 % (ref 39.0–75.0)
Platelets: 179 10*3/uL (ref 140–400)
RBC: 4.21 10*6/uL (ref 4.20–5.82)
RDW: 16.5 % — ABNORMAL HIGH (ref 11.0–14.6)
WBC: 4.6 10*3/uL (ref 4.0–10.3)
lymph#: 1.3 10*3/uL (ref 0.9–3.3)

## 2017-05-25 LAB — COMPREHENSIVE METABOLIC PANEL
ALT: 18 U/L (ref 0–55)
AST: 24 U/L (ref 5–34)
Albumin: 3.7 g/dL (ref 3.5–5.0)
Alkaline Phosphatase: 132 U/L (ref 40–150)
Anion Gap: 11 mEq/L (ref 3–11)
BUN: 28.6 mg/dL — ABNORMAL HIGH (ref 7.0–26.0)
CO2: 21 mEq/L — ABNORMAL LOW (ref 22–29)
Calcium: 9.4 mg/dL (ref 8.4–10.4)
Chloride: 104 mEq/L (ref 98–109)
Creatinine: 1.4 mg/dL — ABNORMAL HIGH (ref 0.7–1.3)
EGFR: >60 mL/min/{1.73_m2} (ref >60)
Glucose: 91 mg/dl (ref 70–140)
Potassium: 3.6 mEq/L (ref 3.5–5.1)
Sodium: 136 mEq/L (ref 136–145)
Total Bilirubin: 0.89 mg/dL (ref 0.20–1.20)
Total Protein: 9.1 g/dL — ABNORMAL HIGH (ref 6.4–8.3)

## 2017-05-25 LAB — RESEARCH LABS

## 2017-05-25 MED ORDER — VARENICLINE TARTRATE 0.5 MG X 11 & 1 MG X 42 PO MISC: ORAL | 0 refills | Status: DC

## 2017-05-25 MED ORDER — SODIUM CHLORIDE 0.9 % IV SOLN
Freq: Once | INTRAVENOUS | Status: AC
Start: 2017-05-25 — End: 2017-05-25
  Administered 2017-05-25: 12:00:00 via INTRAVENOUS

## 2017-05-25 MED ORDER — SODIUM CHLORIDE 0.9 % IV SOLN
10.0000 mg/kg | Freq: Once | INTRAVENOUS | Status: AC
  Administered 2017-05-25: 1000 mg via INTRAVENOUS
  Filled 2017-05-25: qty 20

## 2017-05-25 MED ORDER — VARENICLINE TARTRATE 1 MG PO TABS: 1.0000 mg | ORAL_TABLET | Freq: Two times a day (BID) | ORAL | 1 refills | Status: DC
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
## 2017-05-25 NOTE — Patient Instructions (Signed)
[facility] Discharge Instructions for Patients Receiving Chemotherapy  Today you received the following chemotherapy agents: Imfinzi.  To help prevent nausea and vomiting after your treatment, we encourage you to take your nausea medication as directed.   If you develop nausea and vomiting that is not controlled by your nausea medication, call the clinic.   BELOW ARE SYMPTOMS THAT SHOULD BE REPORTED IMMEDIATELY:  *FEVER GREATER THAN 100.5 F  *CHILLS WITH OR WITHOUT FEVER  NAUSEA AND VOMITING THAT IS NOT CONTROLLED WITH YOUR NAUSEA MEDICATION  *UNUSUAL SHORTNESS OF BREATH  *UNUSUAL BRUISING OR BLEEDING  TENDERNESS IN MOUTH AND THROAT WITH OR WITHOUT PRESENCE OF ULCERS  *URINARY PROBLEMS  *BOWEL PROBLEMS  UNUSUAL RASH Items with * indicate a potential emergency and should be followed up as soon as possible.  Feel free to call the clinic should you have any questions or concerns. The clinic phone number is [phone].  Please show the CHEMO ALERT CARD at check-in to the Emergency Department and triage nurse.   

## 2017-05-25 NOTE — Assessment & Plan Note (Addendum)
This is a very pleasant 61 years old African-American male with a stage IIIa non-small cell lung cancer, squamous cell carcinoma status post course of concurrent chemoradiation with weekly carboplatin and paclitaxel for 5 cycles with partial response. He is currently undergoing treatment with consolidation immunotherapy with Imfinzi (Durvalumab) status post 9 cycles. The patient tolerated the last cycle of his treatment with Imfinzi (Durvalumab) fairly well. I recommended for him to proceed with cycle #10 today as a scheduled. I will see him back for follow-up visit in 2 weeks for evaluation before the next cycle of his treatment.  For smoking cessation, he was given a prescription for Chantix starter pack with the continuing dose to start after the first month.  Prescriptions are written which he will bring to the pharmacy.  Side effects of this medication reviewed with the patient including depression and other neuropsychiatric disorders.  The patient has been on his medication in the past and is aware of the side effects.  He was advised to call immediately if he has any concerning symptoms in the interval. The patient voices understanding of current disease status and treatment options and is in agreement with the current care plan. All questions were answered. The patient knows to call the clinic with any problems, questions or concerns. We can certainly see the patient much sooner if necessary.

## 2017-06-09 ENCOUNTER — Encounter: Payer: Self-pay | Admitting: Internal Medicine

## 2017-06-09 ENCOUNTER — Ambulatory Visit (HOSPITAL_BASED_OUTPATIENT_CLINIC_OR_DEPARTMENT_OTHER): Payer: BLUE CROSS/BLUE SHIELD

## 2017-06-09 ENCOUNTER — Other Ambulatory Visit (HOSPITAL_BASED_OUTPATIENT_CLINIC_OR_DEPARTMENT_OTHER): Payer: BLUE CROSS/BLUE SHIELD

## 2017-06-09 ENCOUNTER — Telehealth: Payer: Self-pay | Admitting: Internal Medicine

## 2017-06-09 ENCOUNTER — Telehealth: Payer: Self-pay | Admitting: Medical Oncology

## 2017-06-09 ENCOUNTER — Ambulatory Visit (HOSPITAL_BASED_OUTPATIENT_CLINIC_OR_DEPARTMENT_OTHER): Payer: BLUE CROSS/BLUE SHIELD | Admitting: Internal Medicine

## 2017-06-09 VITALS — BP 136/91 | HR 94 | Temp 98.6°F | Resp 18 | Ht 74.0 in | Wt 225.1 lb

## 2017-06-09 DIAGNOSIS — C3431 Malignant neoplasm of lower lobe, right bronchus or lung: Secondary | ICD-10-CM

## 2017-06-09 DIAGNOSIS — Z5112 Encounter for antineoplastic immunotherapy: Secondary | ICD-10-CM

## 2017-06-09 DIAGNOSIS — C3491 Malignant neoplasm of unspecified part of right bronchus or lung: Secondary | ICD-10-CM

## 2017-06-09 DIAGNOSIS — C771 Secondary and unspecified malignant neoplasm of intrathoracic lymph nodes: Secondary | ICD-10-CM

## 2017-06-09 DIAGNOSIS — R5382 Chronic fatigue, unspecified: Secondary | ICD-10-CM

## 2017-06-09 LAB — COMPREHENSIVE METABOLIC PANEL
ALT: 18 U/L (ref 0–55)
AST: 30 U/L (ref 5–34)
Albumin: 3.7 g/dL (ref 3.5–5.0)
Alkaline Phosphatase: 126 U/L (ref 40–150)
Anion Gap: 13 mEq/L — ABNORMAL HIGH (ref 3–11)
BUN: 16.9 mg/dL (ref 7.0–26.0)
CO2: 20 mEq/L — ABNORMAL LOW (ref 22–29)
Calcium: 9.2 mg/dL (ref 8.4–10.4)
Chloride: 105 mEq/L (ref 98–109)
Creatinine: 1.2 mg/dL (ref 0.7–1.3)
EGFR: >60 mL/min/{1.73_m2} (ref >60)
Glucose: 99 mg/dl (ref 70–140)
Potassium: 4 mEq/L (ref 3.5–5.1)
Sodium: 139 mEq/L (ref 136–145)
Total Bilirubin: 0.44 mg/dL (ref 0.20–1.20)
Total Protein: 8.9 g/dL — ABNORMAL HIGH (ref 6.4–8.3)

## 2017-06-09 LAB — CBC WITH DIFFERENTIAL/PLATELET
BASO%: 1 % (ref 0.0–2.0)
Basophils Absolute: 0.1 10*3/uL (ref 0.0–0.1)
EOS%: 7.2 % — ABNORMAL HIGH (ref 0.0–7.0)
Eosinophils Absolute: 0.4 10*3/uL (ref 0.0–0.5)
HCT: 38.7 % (ref 38.4–49.9)
HGB: 12.9 g/dL — ABNORMAL LOW (ref 13.0–17.1)
LYMPH%: 27.4 % (ref 14.0–49.0)
MCH: 29.8 pg (ref 27.2–33.4)
MCHC: 33.3 g/dL (ref 32.0–36.0)
MCV: 89.4 fL (ref 79.3–98.0)
MONO#: 0.4 10*3/uL (ref 0.1–0.9)
MONO%: 8.2 % (ref 0.0–14.0)
NEUT#: 2.7 10*3/uL (ref 1.5–6.5)
NEUT%: 56.2 % (ref 39.0–75.0)
Platelets: 181 10*3/uL (ref 140–400)
RBC: 4.33 10*6/uL (ref 4.20–5.82)
RDW: 16.1 % — ABNORMAL HIGH (ref 11.0–14.6)
WBC: 4.9 10*3/uL (ref 4.0–10.3)
lymph#: 1.3 10*3/uL (ref 0.9–3.3)

## 2017-06-09 LAB — TSH: TSH: 1.135 m(IU)/L (ref 0.320–4.118)

## 2017-06-09 MED ORDER — SODIUM CHLORIDE 0.9 % IV SOLN
Freq: Once | INTRAVENOUS | Status: AC
  Administered 2017-06-09: 10:00:00 via INTRAVENOUS

## 2017-06-09 MED ORDER — DURVALUMAB 500 MG/10ML IV SOLN
10.0000 mg/kg | Freq: Once | INTRAVENOUS | Status: AC
  Administered 2017-06-09: 1000 mg via INTRAVENOUS
  Filled 2017-06-09: qty 20

## 2017-06-09 NOTE — Patient Instructions (Signed)
[facility] Discharge Instructions for Patients Receiving Chemotherapy  Today you received the following chemotherapy agents: Imfinzi.  To help prevent nausea and vomiting after your treatment, we encourage you to take your nausea medication as directed.   If you develop nausea and vomiting that is not controlled by your nausea medication, call the clinic.   BELOW ARE SYMPTOMS THAT SHOULD BE REPORTED IMMEDIATELY:  *FEVER GREATER THAN 100.5 F  *CHILLS WITH OR WITHOUT FEVER  NAUSEA AND VOMITING THAT IS NOT CONTROLLED WITH YOUR NAUSEA MEDICATION  *UNUSUAL SHORTNESS OF BREATH  *UNUSUAL BRUISING OR BLEEDING  TENDERNESS IN MOUTH AND THROAT WITH OR WITHOUT PRESENCE OF ULCERS  *URINARY PROBLEMS  *BOWEL PROBLEMS  UNUSUAL RASH Items with * indicate a potential emergency and should be followed up as soon as possible.  Feel free to call the clinic should you have any questions or concerns. The clinic phone number is [phone].  Please show the CHEMO ALERT CARD at check-in to the Emergency Department and triage nurse.   

## 2017-06-09 NOTE — Telephone Encounter (Signed)
LM for her to call back to see what she needs for pts employment benefits.

## 2017-06-09 NOTE — Telephone Encounter (Signed)
Gave avs and calendar for December and January 2019 °
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
## 2017-06-09 NOTE — Addendum Note (Signed)
Addended by: Murvin Natal on: 06/09/2017 09:53 AM   Modules accepted: Orders

## 2017-06-14 ENCOUNTER — Other Ambulatory Visit: Payer: Self-pay | Admitting: Internal Medicine

## 2017-06-14 ENCOUNTER — Other Ambulatory Visit: Payer: Self-pay | Admitting: Family Medicine

## 2017-06-14 DIAGNOSIS — E7800 Pure hypercholesterolemia, unspecified: Secondary | ICD-10-CM

## 2017-06-14 DIAGNOSIS — M1A9XX0 Chronic gout, unspecified, without tophus (tophi): Secondary | ICD-10-CM

## 2017-06-14 DIAGNOSIS — K30 Functional dyspepsia: Secondary | ICD-10-CM

## 2017-06-23 ENCOUNTER — Encounter: Payer: Self-pay | Admitting: Internal Medicine

## 2017-06-23 ENCOUNTER — Other Ambulatory Visit (HOSPITAL_BASED_OUTPATIENT_CLINIC_OR_DEPARTMENT_OTHER): Payer: BLUE CROSS/BLUE SHIELD

## 2017-06-23 ENCOUNTER — Telehealth: Payer: Self-pay | Admitting: Internal Medicine

## 2017-06-23 ENCOUNTER — Ambulatory Visit (HOSPITAL_BASED_OUTPATIENT_CLINIC_OR_DEPARTMENT_OTHER): Payer: BLUE CROSS/BLUE SHIELD

## 2017-06-23 ENCOUNTER — Ambulatory Visit (HOSPITAL_BASED_OUTPATIENT_CLINIC_OR_DEPARTMENT_OTHER): Payer: BLUE CROSS/BLUE SHIELD | Admitting: Internal Medicine

## 2017-06-23 DIAGNOSIS — C771 Secondary and unspecified malignant neoplasm of intrathoracic lymph nodes: Secondary | ICD-10-CM | POA: Diagnosis not present

## 2017-06-23 DIAGNOSIS — C3431 Malignant neoplasm of lower lobe, right bronchus or lung: Secondary | ICD-10-CM | POA: Diagnosis not present

## 2017-06-23 DIAGNOSIS — Z5112 Encounter for antineoplastic immunotherapy: Secondary | ICD-10-CM | POA: Diagnosis not present

## 2017-06-23 DIAGNOSIS — C3490 Malignant neoplasm of unspecified part of unspecified bronchus or lung: Secondary | ICD-10-CM

## 2017-06-23 DIAGNOSIS — C3491 Malignant neoplasm of unspecified part of right bronchus or lung: Secondary | ICD-10-CM

## 2017-06-23 LAB — COMPREHENSIVE METABOLIC PANEL
ALT: 9 U/L (ref 0–55)
AST: 18 U/L (ref 5–34)
Albumin: 3.4 g/dL — ABNORMAL LOW (ref 3.5–5.0)
Alkaline Phosphatase: 134 U/L (ref 40–150)
Anion Gap: 11 mEq/L (ref 3–11)
BUN: 21.8 mg/dL (ref 7.0–26.0)
CO2: 22 mEq/L (ref 22–29)
Calcium: 9.2 mg/dL (ref 8.4–10.4)
Chloride: 104 mEq/L (ref 98–109)
Creatinine: 1.1 mg/dL (ref 0.7–1.3)
EGFR: >60 mL/min/{1.73_m2} (ref >60)
Glucose: 143 mg/dl — ABNORMAL HIGH (ref 70–140)
Potassium: 3.2 mEq/L — ABNORMAL LOW (ref 3.5–5.1)
Sodium: 137 mEq/L (ref 136–145)
Total Bilirubin: 0.44 mg/dL (ref 0.20–1.20)
Total Protein: 8.4 g/dL — ABNORMAL HIGH (ref 6.4–8.3)

## 2017-06-23 LAB — CBC WITH DIFFERENTIAL/PLATELET
BASO%: 0.9 % (ref 0.0–2.0)
Basophils Absolute: 0 10*3/uL (ref 0.0–0.1)
EOS%: 6.4 % (ref 0.0–7.0)
Eosinophils Absolute: 0.3 10*3/uL (ref 0.0–0.5)
HCT: 38.7 % (ref 38.4–49.9)
HGB: 12.8 g/dL — ABNORMAL LOW (ref 13.0–17.1)
LYMPH%: 22.3 % (ref 14.0–49.0)
MCH: 30 pg (ref 27.2–33.4)
MCHC: 33.2 g/dL (ref 32.0–36.0)
MCV: 90.6 fL (ref 79.3–98.0)
MONO#: 0.4 10*3/uL (ref 0.1–0.9)
MONO%: 10.8 % (ref 0.0–14.0)
NEUT#: 2.4 10*3/uL (ref 1.5–6.5)
NEUT%: 59.6 % (ref 39.0–75.0)
Platelets: 181 10*3/uL (ref 140–400)
RBC: 4.27 10*6/uL (ref 4.20–5.82)
RDW: 16 % — ABNORMAL HIGH (ref 11.0–14.6)
WBC: 4 10*3/uL (ref 4.0–10.3)
lymph#: 0.9 10*3/uL (ref 0.9–3.3)

## 2017-06-23 MED ORDER — SODIUM CHLORIDE 0.9 % IV SOLN
Freq: Once | INTRAVENOUS | Status: AC
  Administered 2017-06-23: 10:00:00 via INTRAVENOUS

## 2017-06-23 MED ORDER — SODIUM CHLORIDE 0.9 % IV SOLN
10.0000 mg/kg | Freq: Once | INTRAVENOUS | Status: AC
  Administered 2017-06-23: 1000 mg via INTRAVENOUS
  Filled 2017-06-23: qty 20

## 2017-06-23 NOTE — Telephone Encounter (Signed)
Scheduled appt per 12/20 los - Gave patient AVS and calender per los.  
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
## 2017-06-23 NOTE — Patient Instructions (Signed)
[facility] Discharge Instructions for Patients Receiving Chemotherapy  Today you received the following chemotherapy agents: Imfinzi.  To help prevent nausea and vomiting after your treatment, we encourage you to take your nausea medication as directed.   If you develop nausea and vomiting that is not controlled by your nausea medication, call the clinic.   BELOW ARE SYMPTOMS THAT SHOULD BE REPORTED IMMEDIATELY:  *FEVER GREATER THAN 100.5 F  *CHILLS WITH OR WITHOUT FEVER  NAUSEA AND VOMITING THAT IS NOT CONTROLLED WITH YOUR NAUSEA MEDICATION  *UNUSUAL SHORTNESS OF BREATH  *UNUSUAL BRUISING OR BLEEDING  TENDERNESS IN MOUTH AND THROAT WITH OR WITHOUT PRESENCE OF ULCERS  *URINARY PROBLEMS  *BOWEL PROBLEMS  UNUSUAL RASH Items with * indicate a potential emergency and should be followed up as soon as possible.  Feel free to call the clinic should you have any questions or concerns. The clinic phone number is [phone].  Please show the CHEMO ALERT CARD at check-in to the Emergency Department and triage nurse.   

## 2017-06-27 ENCOUNTER — Other Ambulatory Visit: Payer: Self-pay | Admitting: Family Medicine

## 2017-06-27 DIAGNOSIS — I10 Essential (primary) hypertension: Secondary | ICD-10-CM

## 2017-07-06 ENCOUNTER — Ambulatory Visit (HOSPITAL_COMMUNITY): Payer: BLUE CROSS/BLUE SHIELD

## 2017-07-06 ENCOUNTER — Encounter (HOSPITAL_COMMUNITY): Payer: Self-pay

## 2017-07-07 ENCOUNTER — Encounter (HOSPITAL_COMMUNITY): Payer: Self-pay

## 2017-07-07 ENCOUNTER — Other Ambulatory Visit (HOSPITAL_BASED_OUTPATIENT_CLINIC_OR_DEPARTMENT_OTHER): Payer: BLUE CROSS/BLUE SHIELD

## 2017-07-07 ENCOUNTER — Telehealth: Payer: Self-pay | Admitting: Internal Medicine

## 2017-07-07 ENCOUNTER — Telehealth: Payer: Self-pay | Admitting: Medical Oncology

## 2017-07-07 ENCOUNTER — Encounter: Payer: Self-pay | Admitting: Internal Medicine

## 2017-07-07 ENCOUNTER — Ambulatory Visit (HOSPITAL_BASED_OUTPATIENT_CLINIC_OR_DEPARTMENT_OTHER): Payer: BLUE CROSS/BLUE SHIELD

## 2017-07-07 ENCOUNTER — Ambulatory Visit (HOSPITAL_BASED_OUTPATIENT_CLINIC_OR_DEPARTMENT_OTHER): Payer: BLUE CROSS/BLUE SHIELD | Admitting: Internal Medicine

## 2017-07-07 ENCOUNTER — Ambulatory Visit (HOSPITAL_COMMUNITY)
Admission: RE | Admit: 2017-07-07 | Discharge: 2017-07-07 | Disposition: A | Discharge status: Home / Self Care | Payer: BLUE CROSS/BLUE SHIELD | Source: Ambulatory Visit | Attending: Internal Medicine | Admitting: Internal Medicine

## 2017-07-07 VITALS — BP 118/80 | HR 87 | Temp 97.5°F | Resp 18 | Ht 74.0 in | Wt 228.2 lb

## 2017-07-07 DIAGNOSIS — Z5112 Encounter for antineoplastic immunotherapy: Secondary | ICD-10-CM

## 2017-07-07 DIAGNOSIS — I700 Atherosclerosis of aorta: Secondary | ICD-10-CM | POA: Diagnosis not present

## 2017-07-07 DIAGNOSIS — J439 Emphysema, unspecified: Secondary | ICD-10-CM | POA: Insufficient documentation

## 2017-07-07 DIAGNOSIS — K7460 Unspecified cirrhosis of liver: Secondary | ICD-10-CM | POA: Diagnosis not present

## 2017-07-07 DIAGNOSIS — R5382 Chronic fatigue, unspecified: Secondary | ICD-10-CM

## 2017-07-07 DIAGNOSIS — C3431 Malignant neoplasm of lower lobe, right bronchus or lung: Secondary | ICD-10-CM

## 2017-07-07 DIAGNOSIS — C771 Secondary and unspecified malignant neoplasm of intrathoracic lymph nodes: Secondary | ICD-10-CM | POA: Diagnosis not present

## 2017-07-07 DIAGNOSIS — C3490 Malignant neoplasm of unspecified part of unspecified bronchus or lung: Secondary | ICD-10-CM | POA: Insufficient documentation

## 2017-07-07 DIAGNOSIS — C3491 Malignant neoplasm of unspecified part of right bronchus or lung: Secondary | ICD-10-CM

## 2017-07-07 LAB — COMPREHENSIVE METABOLIC PANEL
ALT: 13 U/L (ref 0–55)
AST: 20 U/L (ref 5–34)
Albumin: 3.5 g/dL (ref 3.5–5.0)
Alkaline Phosphatase: 120 U/L (ref 40–150)
Anion Gap: 12 mEq/L — ABNORMAL HIGH (ref 3–11)
BUN: 17 mg/dL (ref 7.0–26.0)
CO2: 21 mEq/L — ABNORMAL LOW (ref 22–29)
Calcium: 8.9 mg/dL (ref 8.4–10.4)
Chloride: 104 mEq/L (ref 98–109)
Creatinine: 1.2 mg/dL (ref 0.7–1.3)
EGFR: >60 mL/min/{1.73_m2} (ref >60)
Glucose: 154 mg/dl — ABNORMAL HIGH (ref 70–140)
Potassium: 3.4 mEq/L — ABNORMAL LOW (ref 3.5–5.1)
Sodium: 137 mEq/L (ref 136–145)
Total Bilirubin: 0.41 mg/dL (ref 0.20–1.20)
Total Protein: 8.3 g/dL (ref 6.4–8.3)

## 2017-07-07 LAB — CBC WITH DIFFERENTIAL/PLATELET
BASO%: 0.7 % (ref 0.0–2.0)
Basophils Absolute: 0 10*3/uL (ref 0.0–0.1)
EOS%: 8.8 % — ABNORMAL HIGH (ref 0.0–7.0)
Eosinophils Absolute: 0.4 10*3/uL (ref 0.0–0.5)
HCT: 37.5 % — ABNORMAL LOW (ref 38.4–49.9)
HGB: 12.4 g/dL — ABNORMAL LOW (ref 13.0–17.1)
LYMPH%: 24.8 % (ref 14.0–49.0)
MCH: 30 pg (ref 27.2–33.4)
MCHC: 33.1 g/dL (ref 32.0–36.0)
MCV: 90.8 fL (ref 79.3–98.0)
MONO#: 0.5 10*3/uL (ref 0.1–0.9)
MONO%: 11.8 % (ref 0.0–14.0)
NEUT#: 2.2 10*3/uL (ref 1.5–6.5)
NEUT%: 53.9 % (ref 39.0–75.0)
Platelets: 149 10*3/uL (ref 140–400)
RBC: 4.13 10*6/uL — ABNORMAL LOW (ref 4.20–5.82)
RDW: 15.3 % — ABNORMAL HIGH (ref 11.0–14.6)
WBC: 4.1 10*3/uL (ref 4.0–10.3)
lymph#: 1 10*3/uL (ref 0.9–3.3)

## 2017-07-07 LAB — TSH: TSH: 2.995 m(IU)/L (ref 0.320–4.118)

## 2017-07-07 MED ORDER — IOPAMIDOL (ISOVUE-300) INJECTION 61%
INTRAVENOUS | Status: AC
  Filled 2017-07-07: qty 75

## 2017-07-07 MED ORDER — IOPAMIDOL (ISOVUE-300) INJECTION 61%
75.0000 mL | Freq: Once | INTRAVENOUS | Status: AC | PRN
  Administered 2017-07-07: 75 mL via INTRAVENOUS

## 2017-07-07 MED ORDER — SODIUM CHLORIDE 0.9 % IV SOLN
Freq: Once | INTRAVENOUS | Status: AC
  Administered 2017-07-07: 10:00:00 via INTRAVENOUS

## 2017-07-07 MED ORDER — SODIUM CHLORIDE 0.9 % IV SOLN
10.0000 mg/kg | Freq: Once | INTRAVENOUS | Status: AC
  Administered 2017-07-07: 1000 mg via INTRAVENOUS
  Filled 2017-07-07: qty 20

## 2017-07-07 NOTE — Telephone Encounter (Signed)
I answered daughter's question.
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
## 2017-07-07 NOTE — Telephone Encounter (Signed)
Needs tx dates from June to dec. Called back and left information on her VM and requested a call back.

## 2017-07-07 NOTE — Patient Instructions (Signed)
[facility] Discharge Instructions for Patients Receiving Chemotherapy  Today you received the following chemotherapy agents Durvalumab.   To help prevent nausea and vomiting after your treatment, we encourage you to take your nausea medication as prescribed.  If you develop nausea and vomiting that is not controlled by your nausea medication, call the clinic.   BELOW ARE SYMPTOMS THAT SHOULD BE REPORTED IMMEDIATELY:  *FEVER GREATER THAN 100.5 F  *CHILLS WITH OR WITHOUT FEVER  NAUSEA AND VOMITING THAT IS NOT CONTROLLED WITH YOUR NAUSEA MEDICATION  *UNUSUAL SHORTNESS OF BREATH  *UNUSUAL BRUISING OR BLEEDING  TENDERNESS IN MOUTH AND THROAT WITH OR WITHOUT PRESENCE OF ULCERS  *URINARY PROBLEMS  *BOWEL PROBLEMS  UNUSUAL RASH Items with * indicate a potential emergency and should be followed up as soon as possible.  Feel free to call the clinic should you have any questions or concerns. The clinic phone number is [phone].  Please show the [facility] at check-in to the Emergency Department and triage nurse.

## 2017-07-07 NOTE — Telephone Encounter (Signed)
Scheduled appt per 1/3 los - Patient aware of appts added did not want AVS or calender printed.

## 2017-07-08 ENCOUNTER — Encounter: Payer: Self-pay | Admitting: Family Medicine

## 2017-07-08 ENCOUNTER — Ambulatory Visit: Payer: BLUE CROSS/BLUE SHIELD | Attending: Family Medicine | Admitting: Family Medicine

## 2017-07-08 VITALS — BP 126/80 | HR 103 | Temp 97.9°F | Ht 74.0 in | Wt 227.0 lb

## 2017-07-08 DIAGNOSIS — E1065 Type 1 diabetes mellitus with hyperglycemia: Secondary | ICD-10-CM | POA: Diagnosis not present

## 2017-07-08 DIAGNOSIS — M1A9XX0 Chronic gout, unspecified, without tophus (tophi): Secondary | ICD-10-CM

## 2017-07-08 DIAGNOSIS — E108 Type 1 diabetes mellitus with unspecified complications: Secondary | ICD-10-CM

## 2017-07-08 DIAGNOSIS — I10 Essential (primary) hypertension: Secondary | ICD-10-CM | POA: Diagnosis not present

## 2017-07-08 DIAGNOSIS — Z794 Long term (current) use of insulin: Secondary | ICD-10-CM | POA: Diagnosis not present

## 2017-07-08 DIAGNOSIS — Z923 Personal history of irradiation: Secondary | ICD-10-CM | POA: Insufficient documentation

## 2017-07-08 DIAGNOSIS — K219 Gastro-esophageal reflux disease without esophagitis: Secondary | ICD-10-CM | POA: Diagnosis not present

## 2017-07-08 DIAGNOSIS — R0981 Nasal congestion: Secondary | ICD-10-CM

## 2017-07-08 DIAGNOSIS — Z79899 Other long term (current) drug therapy: Secondary | ICD-10-CM | POA: Insufficient documentation

## 2017-07-08 DIAGNOSIS — Z9221 Personal history of antineoplastic chemotherapy: Secondary | ICD-10-CM | POA: Insufficient documentation

## 2017-07-08 DIAGNOSIS — C3491 Malignant neoplasm of unspecified part of right bronchus or lung: Secondary | ICD-10-CM

## 2017-07-08 DIAGNOSIS — M25562 Pain in left knee: Secondary | ICD-10-CM

## 2017-07-08 DIAGNOSIS — E1149 Type 2 diabetes mellitus with other diabetic neurological complication: Secondary | ICD-10-CM

## 2017-07-08 DIAGNOSIS — E1169 Type 2 diabetes mellitus with other specified complication: Secondary | ICD-10-CM

## 2017-07-08 DIAGNOSIS — E7800 Pure hypercholesterolemia, unspecified: Secondary | ICD-10-CM

## 2017-07-08 DIAGNOSIS — E1140 Type 2 diabetes mellitus with diabetic neuropathy, unspecified: Secondary | ICD-10-CM | POA: Insufficient documentation

## 2017-07-08 DIAGNOSIS — E119 Type 2 diabetes mellitus without complications: Secondary | ICD-10-CM | POA: Diagnosis present

## 2017-07-08 LAB — GLUCOSE, POCT (MANUAL RESULT ENTRY): POC Glucose: 115 mg/dl — AB (ref 70–99)

## 2017-07-08 LAB — POCT GLYCOSYLATED HEMOGLOBIN (HGB A1C): Hemoglobin A1C: 5.1

## 2017-07-08 MED ORDER — OMEPRAZOLE 20 MG PO CPDR: 20.0000 mg | DELAYED_RELEASE_CAPSULE | Freq: Every day | ORAL | 1 refills | Status: DC

## 2017-07-08 MED ORDER — AMLODIPINE BESYLATE 10 MG PO TABS: ORAL_TABLET | ORAL | 1 refills | Status: DC

## 2017-07-08 MED ORDER — ATORVASTATIN CALCIUM 20 MG PO TABS: ORAL_TABLET | ORAL | 1 refills | Status: DC

## 2017-07-08 MED ORDER — ALLOPURINOL 300 MG PO TABS: ORAL_TABLET | ORAL | 1 refills | Status: DC

## 2017-07-08 MED ORDER — METFORMIN HCL 500 MG PO TABS: 500.0000 mg | ORAL_TABLET | Freq: Every day | ORAL | 1 refills | Status: DC

## 2017-07-08 MED ORDER — CETIRIZINE HCL 10 MG PO TABS
ORAL_TABLET | ORAL | 1 refills | Status: DC
Start: 2017-07-08 — End: 2018-03-07

## 2017-07-08 MED ORDER — GABAPENTIN 300 MG PO CAPS: 300.0000 mg | ORAL_CAPSULE | Freq: Two times a day (BID) | ORAL | 1 refills | Status: DC
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
## 2017-07-08 NOTE — Patient Instructions (Signed)
Diabetes Mellitus and Nutrition When you have diabetes (diabetes mellitus), it is very important to have healthy eating habits because your blood sugar (glucose) levels are greatly affected by what you eat and drink. Eating healthy foods in the appropriate amounts, at about the same times every day, can help you:  Control your blood glucose.  Lower your risk of heart disease.  Improve your blood pressure.  Reach or maintain a healthy weight.  Every person with diabetes is different, and each person has different needs for a meal plan. Your health care provider may recommend that you work with a diet and nutrition specialist (dietitian) to make a meal plan that is best for you. Your meal plan may vary depending on factors such as:  The calories you need.  The medicines you take.  Your weight.  Your blood glucose, blood pressure, and cholesterol levels.  Your activity level.  Other health conditions you have, such as heart or kidney disease.  How do carbohydrates affect me? Carbohydrates affect your blood glucose level more than any other type of food. Eating carbohydrates naturally increases the amount of glucose in your blood. Carbohydrate counting is a method for keeping track of how many carbohydrates you eat. Counting carbohydrates is important to keep your blood glucose at a healthy level, especially if you use insulin or take certain oral diabetes medicines. It is important to know how many carbohydrates you can safely have in each meal. This is different for every person. Your dietitian can help you calculate how many carbohydrates you should have at each meal and for snack. Foods that contain carbohydrates include:  Bread, cereal, rice, pasta, and crackers.  Potatoes and corn.  Peas, beans, and lentils.  Milk and yogurt.  Fruit and juice.  Desserts, such as cakes, cookies, ice cream, and candy.  How does alcohol affect me? Alcohol can cause a sudden decrease in blood  glucose (hypoglycemia), especially if you use insulin or take certain oral diabetes medicines. Hypoglycemia can be a life-threatening condition. Symptoms of hypoglycemia (sleepiness, dizziness, and confusion) are similar to symptoms of having too much alcohol. If your health care provider says that alcohol is safe for you, follow these guidelines:  Limit alcohol intake to no more than 1 drink per day for nonpregnant women and 2 drinks per day for men. One drink equals 12 oz of beer, 5 oz of wine, or 1 oz of hard liquor.  Do not drink on an empty stomach.  Keep yourself hydrated with water, diet soda, or unsweetened iced tea.  Keep in mind that regular soda, juice, and other mixers may contain a lot of sugar and must be counted as carbohydrates.  What are tips for following this plan? Reading food labels  Start by checking the serving size on the label. The amount of calories, carbohydrates, fats, and other nutrients listed on the label are based on one serving of the food. Many foods contain more than one serving per package.  Check the total grams (g) of carbohydrates in one serving. You can calculate the number of servings of carbohydrates in one serving by dividing the total carbohydrates by 15. For example, if a food has 30 g of total carbohydrates, it would be equal to 2 servings of carbohydrates.  Check the number of grams (g) of saturated and trans fats in one serving. Choose foods that have low or no amount of these fats.  Check the number of milligrams (mg) of sodium in one serving. Most people   should limit total sodium intake to less than 2,300 mg per day.  Always check the nutrition information of foods labeled as "low-fat" or "nonfat". These foods may be higher in added sugar or refined carbohydrates and should be avoided.  Talk to your dietitian to identify your daily goals for nutrients listed on the label. Shopping  Avoid buying canned, premade, or processed foods. These  foods tend to be high in fat, sodium, and added sugar.  Shop around the outside edge of the grocery store. This includes fresh fruits and vegetables, bulk grains, fresh meats, and fresh dairy. Cooking  Use low-heat cooking methods, such as baking, instead of high-heat cooking methods like deep frying.  Cook using healthy oils, such as olive, canola, or sunflower oil.  Avoid cooking with butter, cream, or high-fat meats. Meal planning  Eat meals and snacks regularly, preferably at the same times every day. Avoid going long periods of time without eating.  Eat foods high in fiber, such as fresh fruits, vegetables, beans, and whole grains. Talk to your dietitian about how many servings of carbohydrates you can eat at each meal.  Eat 4-6 ounces of lean protein each day, such as lean meat, chicken, fish, eggs, or tofu. 1 ounce is equal to 1 ounce of meat, chicken, or fish, 1 egg, or 1/4 cup of tofu.  Eat some foods each day that contain healthy fats, such as avocado, nuts, seeds, and fish. Lifestyle   Check your blood glucose regularly.  Exercise at least 30 minutes 5 or more days each week, or as told by your health care provider.  Take medicines as told by your health care provider.  Do not use any products that contain nicotine or tobacco, such as cigarettes and e-cigarettes. If you need help quitting, ask your health care provider.  Work with a counselor or diabetes educator to identify strategies to manage stress and any emotional and social challenges. What are some questions to ask my health care provider?  Do I need to meet with a diabetes educator?  Do I need to meet with a dietitian?  What number can I call if I have questions?  When are the best times to check my blood glucose? Where to find more information:  American Diabetes Association: diabetes.org/food-and-fitness/food  Academy of Nutrition and Dietetics:  www.eatright.org/resources/health/diseases-and-conditions/diabetes  National Institute of Diabetes and Digestive and Kidney Diseases (NIH): www.niddk.nih.gov/health-information/diabetes/overview/diet-eating-physical-activity Summary  A healthy meal plan will help you control your blood glucose and maintain a healthy lifestyle.  Working with a diet and nutrition specialist (dietitian) can help you make a meal plan that is best for you.  Keep in mind that carbohydrates and alcohol have immediate effects on your blood glucose levels. It is important to count carbohydrates and to use alcohol carefully. This information is not intended to replace advice given to you by your health care provider. Make sure you discuss any questions you have with your health care provider. Document Released: 03/18/2005 Document Revised: 07/26/2016 Document Reviewed: 07/26/2016 Elsevier Interactive Patient Education  2018 Elsevier Inc.  

## 2017-07-21 ENCOUNTER — Encounter: Payer: Self-pay | Admitting: Internal Medicine

## 2017-07-21 ENCOUNTER — Inpatient Hospital Stay: Payer: BLUE CROSS/BLUE SHIELD

## 2017-07-21 ENCOUNTER — Inpatient Hospital Stay: Payer: BLUE CROSS/BLUE SHIELD | Attending: Internal Medicine | Admitting: Internal Medicine

## 2017-07-21 VITALS — BP 129/80 | HR 104 | Temp 97.7°F | Resp 18 | Ht 74.0 in | Wt 228.4 lb

## 2017-07-21 VITALS — HR 98

## 2017-07-21 DIAGNOSIS — C3431 Malignant neoplasm of lower lobe, right bronchus or lung: Secondary | ICD-10-CM | POA: Diagnosis present

## 2017-07-21 DIAGNOSIS — I10 Essential (primary) hypertension: Secondary | ICD-10-CM | POA: Diagnosis not present

## 2017-07-21 DIAGNOSIS — C3491 Malignant neoplasm of unspecified part of right bronchus or lung: Secondary | ICD-10-CM

## 2017-07-21 DIAGNOSIS — Z79899 Other long term (current) drug therapy: Secondary | ICD-10-CM | POA: Diagnosis not present

## 2017-07-21 DIAGNOSIS — Z5112 Encounter for antineoplastic immunotherapy: Secondary | ICD-10-CM | POA: Diagnosis not present

## 2017-07-21 DIAGNOSIS — E119 Type 2 diabetes mellitus without complications: Secondary | ICD-10-CM | POA: Diagnosis not present

## 2017-07-21 DIAGNOSIS — C771 Secondary and unspecified malignant neoplasm of intrathoracic lymph nodes: Secondary | ICD-10-CM | POA: Diagnosis not present

## 2017-07-21 LAB — CBC WITH DIFFERENTIAL/PLATELET
Basophils Absolute: 0.1 10*3/uL (ref 0.0–0.1)
Basophils Relative: 2 %
Eosinophils Absolute: 0.3 10*3/uL (ref 0.0–0.5)
Eosinophils Relative: 7 %
HCT: 39.8 % (ref 38.4–49.9)
Hemoglobin: 13.3 g/dL (ref 13.0–17.1)
Lymphocytes Relative: 29 %
Lymphs Abs: 1.2 10*3/uL (ref 0.9–3.3)
MCH: 30.4 pg (ref 27.2–33.4)
MCHC: 33.4 g/dL (ref 32.0–36.0)
MCV: 90.9 fL (ref 79.3–98.0)
Monocytes Absolute: 0.3 10*3/uL (ref 0.1–0.9)
Monocytes Relative: 7 %
Neutro Abs: 2.2 10*3/uL (ref 1.5–6.5)
Neutrophils Relative %: 55 %
Platelets: 203 10*3/uL (ref 140–400)
RBC: 4.38 MIL/uL (ref 4.20–5.82)
RDW: 15.3 % (ref 11.0–15.6)
WBC: 4 10*3/uL (ref 4.0–10.3)

## 2017-07-21 LAB — COMPREHENSIVE METABOLIC PANEL
ALT: 14 U/L (ref 0–55)
AST: 22 U/L (ref 5–34)
Albumin: 3.9 g/dL (ref 3.5–5.0)
Alkaline Phosphatase: 117 U/L (ref 40–150)
Anion gap: 15 — ABNORMAL HIGH (ref 3–11)
BUN: 23 mg/dL (ref 7–26)
CO2: 18 mmol/L — ABNORMAL LOW (ref 22–29)
Calcium: 9.3 mg/dL (ref 8.4–10.4)
Chloride: 106 mmol/L (ref 98–109)
Creatinine, Ser: 1.33 mg/dL — ABNORMAL HIGH (ref 0.70–1.30)
GFR calc Af Amer: >60 mL/min (ref >60)
GFR calc non Af Amer: 55 mL/min — ABNORMAL LOW (ref >60)
Glucose, Bld: 80 mg/dL (ref 70–140)
Potassium: 4 mmol/L (ref 3.5–5.1)
Sodium: 139 mmol/L (ref 136–145)
Total Bilirubin: 0.4 mg/dL (ref 0.2–1.2)
Total Protein: 9 g/dL — ABNORMAL HIGH (ref 6.4–8.3)

## 2017-07-21 MED ORDER — SODIUM CHLORIDE 0.9 % IV SOLN
10.0000 mg/kg | Freq: Once | INTRAVENOUS | Status: AC
  Administered 2017-07-21: 1000 mg via INTRAVENOUS
  Filled 2017-07-21: qty 20

## 2017-07-21 MED ORDER — SODIUM CHLORIDE 0.9 % IV SOLN
Freq: Once | INTRAVENOUS | Status: AC
  Administered 2017-07-21: 11:00:00 via INTRAVENOUS
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
## 2017-07-21 NOTE — Patient Instructions (Signed)
[facility] Discharge Instructions for Patients Receiving Chemotherapy  Today you received the following chemotherapy agents: Imfinzi.  To help prevent nausea and vomiting after your treatment, we encourage you to take your nausea medication as directed.   If you develop nausea and vomiting that is not controlled by your nausea medication, call the clinic.   BELOW ARE SYMPTOMS THAT SHOULD BE REPORTED IMMEDIATELY:  *FEVER GREATER THAN 100.5 F  *CHILLS WITH OR WITHOUT FEVER  NAUSEA AND VOMITING THAT IS NOT CONTROLLED WITH YOUR NAUSEA MEDICATION  *UNUSUAL SHORTNESS OF BREATH  *UNUSUAL BRUISING OR BLEEDING  TENDERNESS IN MOUTH AND THROAT WITH OR WITHOUT PRESENCE OF ULCERS  *URINARY PROBLEMS  *BOWEL PROBLEMS  UNUSUAL RASH Items with * indicate a potential emergency and should be followed up as soon as possible.  Feel free to call the clinic should you have any questions or concerns. The clinic phone number is [phone].  Please show the CHEMO ALERT CARD at check-in to the Emergency Department and triage nurse.   

## 2017-08-04 ENCOUNTER — Inpatient Hospital Stay: Payer: BLUE CROSS/BLUE SHIELD

## 2017-08-04 ENCOUNTER — Telehealth: Payer: Self-pay | Admitting: Internal Medicine

## 2017-08-04 ENCOUNTER — Encounter: Payer: Self-pay | Admitting: Internal Medicine

## 2017-08-04 ENCOUNTER — Inpatient Hospital Stay (HOSPITAL_BASED_OUTPATIENT_CLINIC_OR_DEPARTMENT_OTHER): Payer: BLUE CROSS/BLUE SHIELD | Admitting: Internal Medicine

## 2017-08-04 VITALS — BP 121/87 | HR 99 | Temp 97.9°F | Resp 18 | Ht 74.0 in | Wt 228.4 lb

## 2017-08-04 DIAGNOSIS — E119 Type 2 diabetes mellitus without complications: Secondary | ICD-10-CM

## 2017-08-04 DIAGNOSIS — I10 Essential (primary) hypertension: Secondary | ICD-10-CM | POA: Diagnosis not present

## 2017-08-04 DIAGNOSIS — C3491 Malignant neoplasm of unspecified part of right bronchus or lung: Secondary | ICD-10-CM

## 2017-08-04 DIAGNOSIS — C3431 Malignant neoplasm of lower lobe, right bronchus or lung: Secondary | ICD-10-CM | POA: Diagnosis not present

## 2017-08-04 DIAGNOSIS — Z79899 Other long term (current) drug therapy: Secondary | ICD-10-CM

## 2017-08-04 DIAGNOSIS — C771 Secondary and unspecified malignant neoplasm of intrathoracic lymph nodes: Secondary | ICD-10-CM

## 2017-08-04 DIAGNOSIS — R5382 Chronic fatigue, unspecified: Secondary | ICD-10-CM

## 2017-08-04 DIAGNOSIS — Z5112 Encounter for antineoplastic immunotherapy: Secondary | ICD-10-CM

## 2017-08-04 LAB — CBC WITH DIFFERENTIAL/PLATELET
Basophils Absolute: 0.1 10*3/uL (ref 0.0–0.1)
Basophils Relative: 1 %
Eosinophils Absolute: 0.3 10*3/uL (ref 0.0–0.5)
Eosinophils Relative: 6 %
HCT: 39.3 % (ref 38.4–49.9)
Hemoglobin: 12.9 g/dL — ABNORMAL LOW (ref 13.0–17.1)
Lymphocytes Relative: 20 %
Lymphs Abs: 0.9 10*3/uL (ref 0.9–3.3)
MCH: 29.8 pg (ref 27.2–33.4)
MCHC: 32.9 g/dL (ref 32.0–36.0)
MCV: 90.8 fL (ref 79.3–98.0)
Monocytes Absolute: 0.5 10*3/uL (ref 0.1–0.9)
Monocytes Relative: 10 %
Neutro Abs: 2.9 10*3/uL (ref 1.5–6.5)
Neutrophils Relative %: 63 %
Platelets: 209 10*3/uL (ref 140–400)
RBC: 4.33 MIL/uL (ref 4.20–5.82)
RDW: 16 % — ABNORMAL HIGH (ref 11.0–14.6)
WBC: 4.6 10*3/uL (ref 4.0–10.3)

## 2017-08-04 LAB — COMPREHENSIVE METABOLIC PANEL
ALT: 16 U/L (ref 0–55)
AST: 28 U/L (ref 5–34)
Albumin: 3.7 g/dL (ref 3.5–5.0)
Alkaline Phosphatase: 122 U/L (ref 40–150)
Anion gap: 11 (ref 3–11)
BUN: 19 mg/dL (ref 7–26)
CO2: 23 mmol/L (ref 22–29)
Calcium: 9 mg/dL (ref 8.4–10.4)
Chloride: 106 mmol/L (ref 98–109)
Creatinine, Ser: 1.42 mg/dL — ABNORMAL HIGH (ref 0.70–1.30)
GFR calc Af Amer: 59 mL/min — ABNORMAL LOW (ref >60)
GFR calc non Af Amer: 51 mL/min — ABNORMAL LOW (ref >60)
Glucose, Bld: 107 mg/dL (ref 70–140)
Potassium: 3.5 mmol/L (ref 3.5–5.1)
Sodium: 140 mmol/L (ref 136–145)
Total Bilirubin: 0.8 mg/dL (ref 0.2–1.2)
Total Protein: 8.7 g/dL — ABNORMAL HIGH (ref 6.4–8.3)

## 2017-08-04 LAB — TSH: TSH: 3.525 u[IU]/mL (ref 0.320–4.118)

## 2017-08-04 MED ORDER — SODIUM CHLORIDE 0.9 % IV SOLN
10.0000 mg/kg | Freq: Once | INTRAVENOUS | Status: AC
  Administered 2017-08-04: 1000 mg via INTRAVENOUS
  Filled 2017-08-04: qty 20

## 2017-08-04 MED ORDER — SODIUM CHLORIDE 0.9 % IV SOLN
Freq: Once | INTRAVENOUS | Status: AC
  Administered 2017-08-04: 10:00:00 via INTRAVENOUS

## 2017-08-04 NOTE — Telephone Encounter (Signed)
Patient scheduled and AVS/Calendar given
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
## 2017-08-18 ENCOUNTER — Telehealth: Payer: Self-pay | Admitting: Internal Medicine

## 2017-08-18 ENCOUNTER — Inpatient Hospital Stay: Payer: BLUE CROSS/BLUE SHIELD

## 2017-08-18 ENCOUNTER — Encounter: Payer: Self-pay | Admitting: Oncology

## 2017-08-18 ENCOUNTER — Inpatient Hospital Stay: Payer: BLUE CROSS/BLUE SHIELD | Attending: Internal Medicine

## 2017-08-18 ENCOUNTER — Inpatient Hospital Stay (HOSPITAL_BASED_OUTPATIENT_CLINIC_OR_DEPARTMENT_OTHER): Payer: BLUE CROSS/BLUE SHIELD | Admitting: Oncology

## 2017-08-18 VITALS — BP 121/78 | HR 107 | Temp 98.5°F | Resp 20 | Ht 74.0 in | Wt 227.0 lb

## 2017-08-18 DIAGNOSIS — E119 Type 2 diabetes mellitus without complications: Secondary | ICD-10-CM | POA: Diagnosis not present

## 2017-08-18 DIAGNOSIS — Z5112 Encounter for antineoplastic immunotherapy: Secondary | ICD-10-CM | POA: Diagnosis not present

## 2017-08-18 DIAGNOSIS — I10 Essential (primary) hypertension: Secondary | ICD-10-CM

## 2017-08-18 DIAGNOSIS — C3431 Malignant neoplasm of lower lobe, right bronchus or lung: Secondary | ICD-10-CM

## 2017-08-18 DIAGNOSIS — C3491 Malignant neoplasm of unspecified part of right bronchus or lung: Secondary | ICD-10-CM

## 2017-08-18 DIAGNOSIS — Z794 Long term (current) use of insulin: Secondary | ICD-10-CM | POA: Diagnosis not present

## 2017-08-18 DIAGNOSIS — Z79899 Other long term (current) drug therapy: Secondary | ICD-10-CM

## 2017-08-18 LAB — CBC WITH DIFFERENTIAL/PLATELET
Basophils Absolute: 0.1 10*3/uL (ref 0.0–0.1)
Basophils Relative: 1 %
Eosinophils Absolute: 0.3 10*3/uL (ref 0.0–0.5)
Eosinophils Relative: 7 %
HCT: 37 % — ABNORMAL LOW (ref 38.4–49.9)
Hemoglobin: 12.3 g/dL — ABNORMAL LOW (ref 13.0–17.1)
Lymphocytes Relative: 27 %
Lymphs Abs: 1.1 10*3/uL (ref 0.9–3.3)
MCH: 29.9 pg (ref 27.2–33.4)
MCHC: 33.2 g/dL (ref 32.0–36.0)
MCV: 89.8 fL (ref 79.3–98.0)
Monocytes Absolute: 0.4 10*3/uL (ref 0.1–0.9)
Monocytes Relative: 11 %
Neutro Abs: 2.2 10*3/uL (ref 1.5–6.5)
Neutrophils Relative %: 54 %
Platelets: 220 10*3/uL (ref 140–400)
RBC: 4.12 MIL/uL — ABNORMAL LOW (ref 4.20–5.82)
RDW: 15.3 % — ABNORMAL HIGH (ref 11.0–14.6)
WBC: 4.1 10*3/uL (ref 4.0–10.3)

## 2017-08-18 LAB — COMPREHENSIVE METABOLIC PANEL
ALT: 17 U/L (ref 0–55)
AST: 21 U/L (ref 5–34)
Albumin: 3.4 g/dL — ABNORMAL LOW (ref 3.5–5.0)
Alkaline Phosphatase: 145 U/L (ref 40–150)
Anion gap: 13 — ABNORMAL HIGH (ref 3–11)
BUN: 21 mg/dL (ref 7–26)
CO2: 22 mmol/L (ref 22–29)
Calcium: 9.1 mg/dL (ref 8.4–10.4)
Chloride: 104 mmol/L (ref 98–109)
Creatinine, Ser: 1.41 mg/dL — ABNORMAL HIGH (ref 0.70–1.30)
GFR calc Af Amer: 60 mL/min — ABNORMAL LOW (ref >60)
GFR calc non Af Amer: 52 mL/min — ABNORMAL LOW (ref >60)
Glucose, Bld: 107 mg/dL (ref 70–140)
Potassium: 3.8 mmol/L (ref 3.5–5.1)
Sodium: 139 mmol/L (ref 136–145)
Total Bilirubin: 0.4 mg/dL (ref 0.2–1.2)
Total Protein: 8.4 g/dL — ABNORMAL HIGH (ref 6.4–8.3)

## 2017-08-18 MED ORDER — SODIUM CHLORIDE 0.9 % IV SOLN
10.0000 mg/kg | Freq: Once | INTRAVENOUS | Status: AC
  Administered 2017-08-18: 1000 mg via INTRAVENOUS
  Filled 2017-08-18: qty 20

## 2017-08-18 MED ORDER — SODIUM CHLORIDE 0.9 % IV SOLN
Freq: Once | INTRAVENOUS | Status: AC
  Administered 2017-08-18: 10:00:00 via INTRAVENOUS

## 2017-08-18 NOTE — Assessment & Plan Note (Signed)
This is a very pleasant 63 year old African-American male with a stage IIIa non-small cell lung cancer, squamous cell carcinoma status post course of concurrent chemoradiation with weekly carboplatin and paclitaxel for 5 cycles with partial response. He is currently undergoing treatment with consolidation immunotherapy with Imfinzi (Durvalumab) status post 15 cycles. He continues to tolerate his treatment fairly well with no concerning complaints. I recommended for him to proceed with cycle #16 today as a scheduled. I will see the patient back for follow-up visit in 2 weeks for evaluation before the next cycle of his treatment. He was advised to call immediately if he has any concerning symptoms in the interval. The patient voices understanding of current disease status and treatment options and is in agreement with the current care plan. All questions were answered. The patient knows to call the clinic with any problems, questions or concerns. We can certainly see the patient much sooner if necessary.

## 2017-08-18 NOTE — Patient Instructions (Signed)
[facility] Discharge Instructions for Patients Receiving Chemotherapy  Today you received the following chemotherapy agent: Imfinzi.  To help prevent nausea and vomiting after your treatment, we encourage you to take your nausea medication as directed.  If you develop nausea and vomiting that is not controlled by your nausea medication, call the clinic.   BELOW ARE SYMPTOMS THAT SHOULD BE REPORTED IMMEDIATELY:  *FEVER GREATER THAN 100.5 F  *CHILLS WITH OR WITHOUT FEVER  NAUSEA AND VOMITING THAT IS NOT CONTROLLED WITH YOUR NAUSEA MEDICATION  *UNUSUAL SHORTNESS OF BREATH  *UNUSUAL BRUISING OR BLEEDING  TENDERNESS IN MOUTH AND THROAT WITH OR WITHOUT PRESENCE OF ULCERS  *URINARY PROBLEMS  *BOWEL PROBLEMS  UNUSUAL RASH Items with * indicate a potential emergency and should be followed up as soon as possible.  Feel free to call the clinic should you have any questions or concerns. The clinic phone number is [phone].  Please show the [facility] at check-in to the Emergency Department and triage nurse.

## 2017-08-18 NOTE — Telephone Encounter (Signed)
Scheduled appt per 2/14 los - patient to get an  Updated schedule in the treatment area.
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
## 2017-09-01 ENCOUNTER — Inpatient Hospital Stay (HOSPITAL_BASED_OUTPATIENT_CLINIC_OR_DEPARTMENT_OTHER): Payer: BLUE CROSS/BLUE SHIELD | Admitting: Oncology

## 2017-09-01 ENCOUNTER — Telehealth: Payer: Self-pay

## 2017-09-01 ENCOUNTER — Encounter: Payer: Self-pay | Admitting: Oncology

## 2017-09-01 ENCOUNTER — Inpatient Hospital Stay: Payer: BLUE CROSS/BLUE SHIELD

## 2017-09-01 VITALS — BP 113/84 | HR 91 | Temp 98.0°F | Resp 18 | Ht 74.0 in | Wt 227.1 lb

## 2017-09-01 DIAGNOSIS — C771 Secondary and unspecified malignant neoplasm of intrathoracic lymph nodes: Secondary | ICD-10-CM

## 2017-09-01 DIAGNOSIS — C3491 Malignant neoplasm of unspecified part of right bronchus or lung: Secondary | ICD-10-CM

## 2017-09-01 DIAGNOSIS — C3431 Malignant neoplasm of lower lobe, right bronchus or lung: Secondary | ICD-10-CM

## 2017-09-01 DIAGNOSIS — L299 Pruritus, unspecified: Secondary | ICD-10-CM | POA: Diagnosis not present

## 2017-09-01 DIAGNOSIS — R5382 Chronic fatigue, unspecified: Secondary | ICD-10-CM

## 2017-09-01 DIAGNOSIS — Z5112 Encounter for antineoplastic immunotherapy: Secondary | ICD-10-CM

## 2017-09-01 LAB — CBC WITH DIFFERENTIAL/PLATELET
Basophils Absolute: 0.1 10*3/uL (ref 0.0–0.1)
Basophils Relative: 1 %
Eosinophils Absolute: 0.2 10*3/uL (ref 0.0–0.5)
Eosinophils Relative: 5 %
HCT: 37.3 % — ABNORMAL LOW (ref 38.4–49.9)
Hemoglobin: 12.6 g/dL — ABNORMAL LOW (ref 13.0–17.1)
Lymphocytes Relative: 22 %
Lymphs Abs: 1 10*3/uL (ref 0.9–3.3)
MCH: 30.4 pg (ref 27.2–33.4)
MCHC: 33.8 g/dL (ref 32.0–36.0)
MCV: 90.1 fL (ref 79.3–98.0)
Monocytes Absolute: 0.4 10*3/uL (ref 0.1–0.9)
Monocytes Relative: 9 %
Neutro Abs: 2.8 10*3/uL (ref 1.5–6.5)
Neutrophils Relative %: 63 %
Platelets: 196 10*3/uL (ref 140–400)
RBC: 4.14 MIL/uL — ABNORMAL LOW (ref 4.20–5.82)
RDW: 16.6 % — ABNORMAL HIGH (ref 11.0–14.6)
WBC: 4.5 10*3/uL (ref 4.0–10.3)

## 2017-09-01 LAB — COMPREHENSIVE METABOLIC PANEL
ALT: 18 U/L (ref 0–55)
AST: 28 U/L (ref 5–34)
Albumin: 3.5 g/dL (ref 3.5–5.0)
Alkaline Phosphatase: 126 U/L (ref 40–150)
Anion gap: 11 (ref 3–11)
BUN: 26 mg/dL (ref 7–26)
CO2: 21 mmol/L — ABNORMAL LOW (ref 22–29)
Calcium: 9.3 mg/dL (ref 8.4–10.4)
Chloride: 105 mmol/L (ref 98–109)
Creatinine, Ser: 1.48 mg/dL — ABNORMAL HIGH (ref 0.70–1.30)
GFR calc Af Amer: 56 mL/min — ABNORMAL LOW (ref >60)
GFR calc non Af Amer: 49 mL/min — ABNORMAL LOW (ref >60)
Glucose, Bld: 106 mg/dL (ref 70–140)
Potassium: 3.7 mmol/L (ref 3.5–5.1)
Sodium: 137 mmol/L (ref 136–145)
Total Bilirubin: 0.8 mg/dL (ref 0.2–1.2)
Total Protein: 8.6 g/dL — ABNORMAL HIGH (ref 6.4–8.3)

## 2017-09-01 LAB — TSH: TSH: 2.234 u[IU]/mL (ref 0.320–4.118)

## 2017-09-01 MED ORDER — SODIUM CHLORIDE 0.9 % IV SOLN
Freq: Once | INTRAVENOUS | Status: AC
  Administered 2017-09-01: 10:00:00 via INTRAVENOUS

## 2017-09-01 MED ORDER — SODIUM CHLORIDE 0.9 % IV SOLN
10.0000 mg/kg | Freq: Once | INTRAVENOUS | Status: AC
  Administered 2017-09-01: 1000 mg via INTRAVENOUS
  Filled 2017-09-01: qty 20

## 2017-09-01 NOTE — Telephone Encounter (Signed)
Printed avs and calender of upcoming appointment, and added 2 more treatments. Per 2/28 los

## 2017-09-01 NOTE — Patient Instructions (Signed)
[facility] Discharge Instructions for Patients Receiving Chemotherapy  Today you received the following chemotherapy agent: Imfinzi.  To help prevent nausea and vomiting after your treatment, we encourage you to take your nausea medication as directed.  If you develop nausea and vomiting that is not controlled by your nausea medication, call the clinic.   BELOW ARE SYMPTOMS THAT SHOULD BE REPORTED IMMEDIATELY:  *FEVER GREATER THAN 100.5 F  *CHILLS WITH OR WITHOUT FEVER  NAUSEA AND VOMITING THAT IS NOT CONTROLLED WITH YOUR NAUSEA MEDICATION  *UNUSUAL SHORTNESS OF BREATH  *UNUSUAL BRUISING OR BLEEDING  TENDERNESS IN MOUTH AND THROAT WITH OR WITHOUT PRESENCE OF ULCERS  *URINARY PROBLEMS  *BOWEL PROBLEMS  UNUSUAL RASH Items with * indicate a potential emergency and should be followed up as soon as possible.  Feel free to call the clinic should you have any questions or concerns. The clinic phone number is [phone].  Please show the [facility] at check-in to the Emergency Department and triage nurse.
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
## 2017-09-01 NOTE — Assessment & Plan Note (Signed)
This is a very pleasant 62 year old African-American male with a stage IIIa non-small cell lung cancer, squamous cell carcinoma status post course of concurrent chemoradiation with weekly carboplatin and paclitaxel for 5 cycles with partial response. He is currently undergoing treatment with consolidation immunotherapy with Imfinzi (Durvalumab) status post 16cycles. He continues to tolerate his treatment fairly well with no concerning complaints. I recommended for him to proceed with cycle #17 today as a scheduled.  For his itching and dry skin on his back, I have recommended that he use moisturizer 1-2 times a day.  He may also use topical hydrocortisone for the itching.  If itching is not relieved with hydrocortisone, he may use over-the-counter Zyrtec or Claritin.  I will see the patient back for follow-up visit in 2 weeks for evaluation before the next cycle of his treatment. He was advised to call immediately if he has any concerning symptoms in the interval. The patient voices understanding of current disease status and treatment options and is in agreement with the current care plan. All questions were answered. The patient knows to call the clinic with any problems, questions or concerns. We can certainly see the patient much sooner if necessary.

## 2017-09-09 ENCOUNTER — Telehealth: Payer: Self-pay | Admitting: Medical Oncology

## 2017-09-09 ENCOUNTER — Telehealth: Payer: Self-pay

## 2017-09-09 NOTE — Telephone Encounter (Signed)
Received a call from the patient's daughter Andee Poles) that patient told her he passed out last night. Per daughter, patient stated he is ok and did not hit his head and no other injuries apparent. Made Mikey Bussing, NP aware. Per Erasmo Downer, episode not likely related to chemotherapy. Patient to follow up with PCP in regards to diabetes management and cardiac medications. Patient's daughter instructed to take patient to Urgent Care or ED if any episodes occur over the weekend. Patient's daughter-Danielle will call the office with any further questions or concerns.

## 2017-09-09 NOTE — Telephone Encounter (Signed)
ERR

## 2017-09-15 ENCOUNTER — Telehealth: Payer: Self-pay | Admitting: Oncology

## 2017-09-15 ENCOUNTER — Inpatient Hospital Stay (HOSPITAL_BASED_OUTPATIENT_CLINIC_OR_DEPARTMENT_OTHER): Payer: BLUE CROSS/BLUE SHIELD | Admitting: Oncology

## 2017-09-15 ENCOUNTER — Inpatient Hospital Stay: Payer: BLUE CROSS/BLUE SHIELD | Attending: Internal Medicine

## 2017-09-15 ENCOUNTER — Inpatient Hospital Stay: Payer: BLUE CROSS/BLUE SHIELD

## 2017-09-15 ENCOUNTER — Encounter: Payer: Self-pay | Admitting: Oncology

## 2017-09-15 VITALS — HR 98

## 2017-09-15 VITALS — BP 129/94 | HR 109 | Temp 98.0°F | Resp 20 | Ht 74.0 in | Wt 230.7 lb

## 2017-09-15 DIAGNOSIS — C771 Secondary and unspecified malignant neoplasm of intrathoracic lymph nodes: Secondary | ICD-10-CM

## 2017-09-15 DIAGNOSIS — Z79899 Other long term (current) drug therapy: Secondary | ICD-10-CM | POA: Diagnosis not present

## 2017-09-15 DIAGNOSIS — R55 Syncope and collapse: Secondary | ICD-10-CM | POA: Insufficient documentation

## 2017-09-15 DIAGNOSIS — C3491 Malignant neoplasm of unspecified part of right bronchus or lung: Secondary | ICD-10-CM

## 2017-09-15 DIAGNOSIS — I10 Essential (primary) hypertension: Secondary | ICD-10-CM

## 2017-09-15 DIAGNOSIS — E119 Type 2 diabetes mellitus without complications: Secondary | ICD-10-CM | POA: Diagnosis not present

## 2017-09-15 DIAGNOSIS — C3431 Malignant neoplasm of lower lobe, right bronchus or lung: Secondary | ICD-10-CM

## 2017-09-15 DIAGNOSIS — Z5112 Encounter for antineoplastic immunotherapy: Secondary | ICD-10-CM

## 2017-09-15 DIAGNOSIS — M109 Gout, unspecified: Secondary | ICD-10-CM | POA: Insufficient documentation

## 2017-09-15 LAB — COMPREHENSIVE METABOLIC PANEL
ALT: 26 U/L (ref 0–55)
AST: 32 U/L (ref 5–34)
Albumin: 3.5 g/dL (ref 3.5–5.0)
Alkaline Phosphatase: 134 U/L (ref 40–150)
Anion gap: 14 — ABNORMAL HIGH (ref 3–11)
BUN: 24 mg/dL (ref 7–26)
CO2: 21 mmol/L — ABNORMAL LOW (ref 22–29)
Calcium: 9 mg/dL (ref 8.4–10.4)
Chloride: 105 mmol/L (ref 98–109)
Creatinine, Ser: 1.33 mg/dL — ABNORMAL HIGH (ref 0.70–1.30)
GFR calc Af Amer: >60 mL/min (ref >60)
GFR calc non Af Amer: 55 mL/min — ABNORMAL LOW (ref >60)
Glucose, Bld: 77 mg/dL (ref 70–140)
Potassium: 3.6 mmol/L (ref 3.5–5.1)
Sodium: 140 mmol/L (ref 136–145)
Total Bilirubin: 0.4 mg/dL (ref 0.2–1.2)
Total Protein: 8.8 g/dL — ABNORMAL HIGH (ref 6.4–8.3)

## 2017-09-15 LAB — CBC WITH DIFFERENTIAL/PLATELET
Basophils Absolute: 0 10*3/uL (ref 0.0–0.1)
Basophils Relative: 1 %
Eosinophils Absolute: 0.4 10*3/uL (ref 0.0–0.5)
Eosinophils Relative: 7 %
HCT: 34.2 % — ABNORMAL LOW (ref 38.4–49.9)
Hemoglobin: 11.6 g/dL — ABNORMAL LOW (ref 13.0–17.1)
Lymphocytes Relative: 27 %
Lymphs Abs: 1.3 10*3/uL (ref 0.9–3.3)
MCH: 30.9 pg (ref 27.2–33.4)
MCHC: 33.9 g/dL (ref 32.0–36.0)
MCV: 91 fL (ref 79.3–98.0)
Monocytes Absolute: 0.4 10*3/uL (ref 0.1–0.9)
Monocytes Relative: 8 %
Neutro Abs: 2.8 10*3/uL (ref 1.5–6.5)
Neutrophils Relative %: 57 %
Platelets: 186 10*3/uL (ref 140–400)
RBC: 3.76 MIL/uL — ABNORMAL LOW (ref 4.20–5.82)
RDW: 16.8 % — ABNORMAL HIGH (ref 11.0–14.6)
WBC: 4.9 10*3/uL (ref 4.0–10.3)

## 2017-09-15 MED ORDER — SODIUM CHLORIDE 0.9 % IV SOLN
Freq: Once | INTRAVENOUS | Status: AC
  Administered 2017-09-15: 10:00:00 via INTRAVENOUS

## 2017-09-15 MED ORDER — DURVALUMAB 500 MG/10ML IV SOLN
10.0000 mg/kg | Freq: Once | INTRAVENOUS | Status: AC
  Administered 2017-09-15: 1000 mg via INTRAVENOUS
  Filled 2017-09-15: qty 20
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
## 2017-09-15 NOTE — Patient Instructions (Signed)
[facility] Discharge Instructions for Patients Receiving Chemotherapy  Today you received the following chemotherapy agents Durvalumab.   To help prevent nausea and vomiting after your treatment, we encourage you to take your nausea medication as prescribed.  If you develop nausea and vomiting that is not controlled by your nausea medication, call the clinic.   BELOW ARE SYMPTOMS THAT SHOULD BE REPORTED IMMEDIATELY:  *FEVER GREATER THAN 100.5 F  *CHILLS WITH OR WITHOUT FEVER  NAUSEA AND VOMITING THAT IS NOT CONTROLLED WITH YOUR NAUSEA MEDICATION  *UNUSUAL SHORTNESS OF BREATH  *UNUSUAL BRUISING OR BLEEDING  TENDERNESS IN MOUTH AND THROAT WITH OR WITHOUT PRESENCE OF ULCERS  *URINARY PROBLEMS  *BOWEL PROBLEMS  UNUSUAL RASH Items with * indicate a potential emergency and should be followed up as soon as possible.  Feel free to call the clinic should you have any questions or concerns. The clinic phone number is [phone].  Please show the [place] at check-in to the Emergency Department and triage nurse.

## 2017-09-15 NOTE — Telephone Encounter (Signed)
appts already scheduled per 3/14 los - no additional appts to add.
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
## 2017-09-27 ENCOUNTER — Encounter (HOSPITAL_COMMUNITY): Payer: Self-pay

## 2017-09-27 ENCOUNTER — Ambulatory Visit (HOSPITAL_COMMUNITY)
Admission: RE | Admit: 2017-09-27 | Discharge: 2017-09-27 | Disposition: A | Discharge status: Home / Self Care | Payer: BLUE CROSS/BLUE SHIELD | Source: Ambulatory Visit | Attending: Oncology | Admitting: Oncology

## 2017-09-27 DIAGNOSIS — C3491 Malignant neoplasm of unspecified part of right bronchus or lung: Secondary | ICD-10-CM | POA: Diagnosis not present

## 2017-09-27 DIAGNOSIS — I700 Atherosclerosis of aorta: Secondary | ICD-10-CM | POA: Insufficient documentation

## 2017-09-27 DIAGNOSIS — J439 Emphysema, unspecified: Secondary | ICD-10-CM | POA: Diagnosis not present

## 2017-09-27 DIAGNOSIS — K7460 Unspecified cirrhosis of liver: Secondary | ICD-10-CM | POA: Diagnosis not present

## 2017-09-27 DIAGNOSIS — R55 Syncope and collapse: Secondary | ICD-10-CM

## 2017-09-27 MED ORDER — IOPAMIDOL (ISOVUE-300) INJECTION 61%: 75.0000 mL | Freq: Once | INTRAVENOUS | Status: DC | PRN

## 2017-09-27 MED ORDER — IOPAMIDOL (ISOVUE-300) INJECTION 61%
INTRAVENOUS | Status: AC
  Administered 2017-09-27: 75 mL via INTRAVENOUS
  Filled 2017-09-27: qty 75

## 2017-09-29 ENCOUNTER — Telehealth: Payer: Self-pay | Admitting: Internal Medicine

## 2017-09-29 ENCOUNTER — Inpatient Hospital Stay (HOSPITAL_BASED_OUTPATIENT_CLINIC_OR_DEPARTMENT_OTHER): Payer: BLUE CROSS/BLUE SHIELD | Admitting: Internal Medicine

## 2017-09-29 ENCOUNTER — Inpatient Hospital Stay: Payer: BLUE CROSS/BLUE SHIELD

## 2017-09-29 ENCOUNTER — Encounter: Payer: Self-pay | Admitting: Internal Medicine

## 2017-09-29 VITALS — BP 133/83 | HR 94 | Temp 97.5°F | Resp 18 | Ht 74.0 in | Wt 228.9 lb

## 2017-09-29 DIAGNOSIS — C771 Secondary and unspecified malignant neoplasm of intrathoracic lymph nodes: Secondary | ICD-10-CM | POA: Diagnosis not present

## 2017-09-29 DIAGNOSIS — C3431 Malignant neoplasm of lower lobe, right bronchus or lung: Secondary | ICD-10-CM

## 2017-09-29 DIAGNOSIS — R55 Syncope and collapse: Secondary | ICD-10-CM | POA: Diagnosis not present

## 2017-09-29 DIAGNOSIS — C3491 Malignant neoplasm of unspecified part of right bronchus or lung: Secondary | ICD-10-CM

## 2017-09-29 DIAGNOSIS — I10 Essential (primary) hypertension: Secondary | ICD-10-CM

## 2017-09-29 DIAGNOSIS — R5382 Chronic fatigue, unspecified: Secondary | ICD-10-CM

## 2017-09-29 DIAGNOSIS — Z5112 Encounter for antineoplastic immunotherapy: Secondary | ICD-10-CM

## 2017-09-29 LAB — CBC WITH DIFFERENTIAL/PLATELET
Basophils Absolute: 0.1 10*3/uL (ref 0.0–0.1)
Basophils Relative: 1 %
Eosinophils Absolute: 0.4 10*3/uL (ref 0.0–0.5)
Eosinophils Relative: 9 %
HCT: 39.1 % (ref 38.4–49.9)
Hemoglobin: 13 g/dL (ref 13.0–17.1)
Lymphocytes Relative: 35 %
Lymphs Abs: 1.6 10*3/uL (ref 0.9–3.3)
MCH: 31.1 pg (ref 27.2–33.4)
MCHC: 33.3 g/dL (ref 32.0–36.0)
MCV: 93.6 fL (ref 79.3–98.0)
Monocytes Absolute: 0.3 10*3/uL (ref 0.1–0.9)
Monocytes Relative: 6 %
Neutro Abs: 2.3 10*3/uL (ref 1.5–6.5)
Neutrophils Relative %: 49 %
Platelets: 206 10*3/uL (ref 140–400)
RBC: 4.17 MIL/uL — ABNORMAL LOW (ref 4.20–5.82)
RDW: 17.6 % — ABNORMAL HIGH (ref 11.0–14.6)
WBC: 4.7 10*3/uL (ref 4.0–10.3)

## 2017-09-29 LAB — COMPREHENSIVE METABOLIC PANEL
ALT: 16 U/L (ref 0–55)
AST: 33 U/L (ref 5–34)
Albumin: 3.7 g/dL (ref 3.5–5.0)
Alkaline Phosphatase: 123 U/L (ref 40–150)
Anion gap: 14 — ABNORMAL HIGH (ref 3–11)
BUN: 34 mg/dL — ABNORMAL HIGH (ref 7–26)
CO2: 20 mmol/L — ABNORMAL LOW (ref 22–29)
Calcium: 9.3 mg/dL (ref 8.4–10.4)
Chloride: 107 mmol/L (ref 98–109)
Creatinine, Ser: 1.35 mg/dL — ABNORMAL HIGH (ref 0.70–1.30)
GFR calc Af Amer: >60 mL/min (ref >60)
GFR calc non Af Amer: 54 mL/min — ABNORMAL LOW (ref >60)
Glucose, Bld: 79 mg/dL (ref 70–140)
Potassium: 3.9 mmol/L (ref 3.5–5.1)
Sodium: 141 mmol/L (ref 136–145)
Total Bilirubin: 0.5 mg/dL (ref 0.2–1.2)
Total Protein: 8.8 g/dL — ABNORMAL HIGH (ref 6.4–8.3)

## 2017-09-29 LAB — TSH: TSH: 1.044 u[IU]/mL (ref 0.320–4.118)

## 2017-09-29 MED ORDER — SODIUM CHLORIDE 0.9 % IV SOLN
10.0000 mg/kg | Freq: Once | INTRAVENOUS | Status: AC
  Administered 2017-09-29: 1000 mg via INTRAVENOUS
  Filled 2017-09-29: qty 20

## 2017-09-29 MED ORDER — SODIUM CHLORIDE 0.9 % IV SOLN
Freq: Once | INTRAVENOUS | Status: AC
  Administered 2017-09-29: 12:00:00 via INTRAVENOUS

## 2017-09-29 NOTE — Telephone Encounter (Signed)
Appointments scheduled AVS/Calendar printed per 3/28 los
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
## 2017-10-13 ENCOUNTER — Inpatient Hospital Stay (HOSPITAL_BASED_OUTPATIENT_CLINIC_OR_DEPARTMENT_OTHER): Payer: BLUE CROSS/BLUE SHIELD | Admitting: Internal Medicine

## 2017-10-13 ENCOUNTER — Encounter: Payer: Self-pay | Admitting: Internal Medicine

## 2017-10-13 ENCOUNTER — Telehealth: Payer: Self-pay | Admitting: Internal Medicine

## 2017-10-13 ENCOUNTER — Inpatient Hospital Stay: Payer: BLUE CROSS/BLUE SHIELD

## 2017-10-13 ENCOUNTER — Encounter: Payer: Self-pay | Admitting: *Deleted

## 2017-10-13 ENCOUNTER — Inpatient Hospital Stay: Payer: BLUE CROSS/BLUE SHIELD | Attending: Internal Medicine

## 2017-10-13 VITALS — BP 119/77 | HR 100 | Temp 98.2°F | Resp 18 | Ht 74.0 in | Wt 226.3 lb

## 2017-10-13 DIAGNOSIS — I10 Essential (primary) hypertension: Secondary | ICD-10-CM | POA: Insufficient documentation

## 2017-10-13 DIAGNOSIS — C3431 Malignant neoplasm of lower lobe, right bronchus or lung: Secondary | ICD-10-CM

## 2017-10-13 DIAGNOSIS — C771 Secondary and unspecified malignant neoplasm of intrathoracic lymph nodes: Secondary | ICD-10-CM | POA: Insufficient documentation

## 2017-10-13 DIAGNOSIS — Z5112 Encounter for antineoplastic immunotherapy: Secondary | ICD-10-CM

## 2017-10-13 DIAGNOSIS — C3491 Malignant neoplasm of unspecified part of right bronchus or lung: Secondary | ICD-10-CM

## 2017-10-13 DIAGNOSIS — Z79899 Other long term (current) drug therapy: Secondary | ICD-10-CM | POA: Diagnosis not present

## 2017-10-13 DIAGNOSIS — E119 Type 2 diabetes mellitus without complications: Secondary | ICD-10-CM

## 2017-10-13 LAB — CBC WITH DIFFERENTIAL/PLATELET
Basophils Absolute: 0.1 10*3/uL (ref 0.0–0.1)
Basophils Relative: 1 %
Eosinophils Absolute: 0.2 10*3/uL (ref 0.0–0.5)
Eosinophils Relative: 5 %
HCT: 37.4 % — ABNORMAL LOW (ref 38.4–49.9)
Hemoglobin: 12.6 g/dL — ABNORMAL LOW (ref 13.0–17.1)
Lymphocytes Relative: 41 %
Lymphs Abs: 1.7 10*3/uL (ref 0.9–3.3)
MCH: 31.7 pg (ref 27.2–33.4)
MCHC: 33.7 g/dL (ref 32.0–36.0)
MCV: 94 fL (ref 79.3–98.0)
Monocytes Absolute: 0.2 10*3/uL (ref 0.1–0.9)
Monocytes Relative: 4 %
Neutro Abs: 2 10*3/uL (ref 1.5–6.5)
Neutrophils Relative %: 49 %
Platelets: 185 10*3/uL (ref 140–400)
RBC: 3.98 MIL/uL — ABNORMAL LOW (ref 4.20–5.82)
RDW: 16.1 % — ABNORMAL HIGH (ref 11.0–14.6)
WBC: 4.2 10*3/uL (ref 4.0–10.3)

## 2017-10-13 LAB — COMPREHENSIVE METABOLIC PANEL
ALT: 39 U/L (ref 0–55)
AST: 66 U/L — ABNORMAL HIGH (ref 5–34)
Albumin: 3.7 g/dL (ref 3.5–5.0)
Alkaline Phosphatase: 155 U/L — ABNORMAL HIGH (ref 40–150)
Anion gap: 17 — ABNORMAL HIGH (ref 3–11)
BUN: 32 mg/dL — ABNORMAL HIGH (ref 7–26)
CO2: 19 mmol/L — ABNORMAL LOW (ref 22–29)
Calcium: 8.8 mg/dL (ref 8.4–10.4)
Chloride: 103 mmol/L (ref 98–109)
Creatinine, Ser: 1.44 mg/dL — ABNORMAL HIGH (ref 0.70–1.30)
GFR calc Af Amer: 58 mL/min — ABNORMAL LOW (ref >60)
GFR calc non Af Amer: 50 mL/min — ABNORMAL LOW (ref >60)
Glucose, Bld: 68 mg/dL — ABNORMAL LOW (ref 70–140)
Potassium: 3.3 mmol/L — ABNORMAL LOW (ref 3.5–5.1)
Sodium: 139 mmol/L (ref 136–145)
Total Bilirubin: 0.6 mg/dL (ref 0.2–1.2)
Total Protein: 9.2 g/dL — ABNORMAL HIGH (ref 6.4–8.3)

## 2017-10-13 MED ORDER — SODIUM CHLORIDE 0.9 % IV SOLN
10.0000 mg/kg | Freq: Once | INTRAVENOUS | Status: AC
  Administered 2017-10-13: 1000 mg via INTRAVENOUS
  Filled 2017-10-13: qty 20

## 2017-10-13 MED ORDER — SODIUM CHLORIDE 0.9 % IV SOLN
Freq: Once | INTRAVENOUS | Status: AC
  Administered 2017-10-13: 09:00:00 via INTRAVENOUS

## 2017-10-13 NOTE — Telephone Encounter (Signed)
Appt scheduled AVS/Calendar printed per 4/11 los
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
## 2017-10-13 NOTE — Patient Instructions (Addendum)
[facility] Discharge Instructions for Patients Receiving Chemotherapy  Today you received the following chemotherapy agent: Imfinzi.  To help prevent nausea and vomiting after your treatment, we encourage you to take your nausea medication as directed.  If you develop nausea and vomiting that is not controlled by your nausea medication, call the clinic.   BELOW ARE SYMPTOMS THAT SHOULD BE REPORTED IMMEDIATELY:  *FEVER GREATER THAN 100.5 F  *CHILLS WITH OR WITHOUT FEVER  NAUSEA AND VOMITING THAT IS NOT CONTROLLED WITH YOUR NAUSEA MEDICATION  *UNUSUAL SHORTNESS OF BREATH  *UNUSUAL BRUISING OR BLEEDING  TENDERNESS IN MOUTH AND THROAT WITH OR WITHOUT PRESENCE OF ULCERS  *URINARY PROBLEMS  *BOWEL PROBLEMS  UNUSUAL RASH Items with * indicate a potential emergency and should be followed up as soon as possible.  Feel free to call the clinic should you have any questions or concerns. The clinic phone number is [phone].  Please show the [facility] at check-in to the Emergency Department and triage nurse.   Hypokalemia (Low potassium) Hypokalemia means that the amount of potassium in the blood is lower than normal.Potassium is a chemical that helps regulate the amount of fluid in the body (electrolyte). It also stimulates muscle tightening (contraction) and helps nerves work properly.Normally, most of the body's potassium is inside of cells, and only a very small amount is in the blood. Because the amount in the blood is so small, minor changes to potassium levels in the blood can be life-threatening. What are the causes? This condition may be caused by:  Antibiotic medicine.  Diarrhea or vomiting. Taking too much of a medicine that helps you have a bowel movement (laxative) can cause diarrhea and lead to hypokalemia.  Chronic kidney disease (CKD).  Medicines that help the body get rid of excess fluid (diuretics).  Eating disorders, such as  bulimia.  Low magnesium levels in the body.  Sweating a lot.  What are the signs or symptoms? Symptoms of this condition include:  Weakness.  Constipation.  Fatigue.  Muscle cramps.  Mental confusion.  Skipped heartbeats or irregular heartbeat (palpitations).  Tingling or numbness.  How is this diagnosed? This condition is diagnosed with a blood test. How is this treated? Hypokalemia can be treated by taking potassium supplements by mouth or adjusting the medicines that you take. Treatment may also include eating more foods that contain a lot of potassium. If your potassium level is very low, you may need to get potassium through an IV tube in one of your veins and be monitored in the hospital. Follow these instructions at home:  Take over-the-counter and prescription medicines only as told by your health care provider. This includes vitamins and supplements.  Eat a healthy diet. A healthy diet includes fresh fruits and vegetables, whole grains, healthy fats, and lean proteins.  If instructed, eat more foods that contain a lot of potassium, such as: ? Nuts, such as peanuts and pistachios. ? Seeds, such as sunflower seeds and pumpkin seeds. ? Peas, lentils, and lima beans. ? Whole grain and bran cereals and breads. ? Fresh fruits and vegetables, such as apricots, avocado, bananas, cantaloupe, kiwi, oranges, tomatoes, asparagus, and potatoes. ? Orange juice. ? Tomato juice. ? Red meats. ? Yogurt.  Keep all follow-up visits as told by your health care provider. This is important. Contact a health care provider if:  You have weakness that gets worse.  You feel your heart pounding or racing.  You vomit.  You have diarrhea.  You have diabetes (diabetes mellitus) and you have trouble keeping your blood sugar (glucose) in your target range. Get help right away if:  You have chest pain.  You have shortness of breath.  You have vomiting or diarrhea that lasts for  more than 2 days.  You faint. This information is not intended to replace advice given to you by your health care provider. Make sure you discuss any questions you have with your health care provider. Document Released: 06/21/2005 Document Revised: 02/07/2016 Document Reviewed: 02/07/2016 Elsevier Interactive Patient Education  2018 [employer].

## 2017-10-13 NOTE — Progress Notes (Signed)
Pt potassium 3.3 today, [employer] engineer for  Dr Julien Nordmann aware. Pt to increase oral intake of potassium. Pt verbalized understanding.

## 2017-10-13 NOTE — Progress Notes (Signed)
Oncology Nurse Navigator Documentation  Oncology Nurse Navigator Flowsheets 10/13/2017  Navigator Location [facility]  Navigator Encounter Type Clinic/MDC/I spoke with patient today.  He is doing well on cycle 20 of IO therapy.  I did identify barrier to care with education. I educated him on treatment plan.  He verbalized understanding.   Patient Visit Type MedOnc  Treatment Phase Treatment  Barriers/Navigation Needs Education  Education Other  Interventions Education  Education Method Verbal  Acuity Level 1  Time Spent with Patient 30

## 2017-10-17 ENCOUNTER — Telehealth: Payer: Self-pay | Admitting: Medical Oncology

## 2017-10-17 ENCOUNTER — Telehealth: Payer: Self-pay | Admitting: *Deleted

## 2017-10-17 ENCOUNTER — Other Ambulatory Visit: Payer: Self-pay | Admitting: Medical Oncology

## 2017-10-17 DIAGNOSIS — E876 Hypokalemia: Secondary | ICD-10-CM

## 2017-10-17 MED ORDER — POTASSIUM CHLORIDE CRYS ER 20 MEQ PO TBCR: 20.0000 meq | EXTENDED_RELEASE_TABLET | Freq: Every day | ORAL | 0 refills | Status: DC

## 2017-10-17 NOTE — Telephone Encounter (Signed)
"  Calling in reference to medication order last week in reference to potassium [facility] at [place]. Has not yet received."

## 2017-10-17 NOTE — Telephone Encounter (Signed)
Per Julien Nordmann KDur called in to pharmacy and pt instructed to call back if cramps persist.

## 2017-10-26 NOTE — Assessment & Plan Note (Addendum)
This is a very pleasant 61 year old African-American male with a stage IIIa non-small cell lung cancer, squamous cell carcinoma status post course of concurrent chemoradiation with weekly carboplatin and paclitaxel for 5 cycles with partial response. He is currently undergoing treatment with consolidation immunotherapy with Imfinzi (Durvalumab) status post 20 cycles.  He continues to tolerate this treatment very well. I recommended for the patient to proceed with cycle #21 today as a scheduled. Okay to treat with ANC of 1.4 and creatinine of 1.45.  AST remains slightly elevated at 49 but has improved.  Okay to proceed with Infinzi as scheduled. He will come back for follow-up visit in 2 weeks for evaluation before the next cycle of his treatment.  The patient voices understanding of current disease status and treatment options and is in agreement with the current care plan. All questions were answered. The patient knows to call the clinic with any problems, questions or concerns. We can certainly see the patient much sooner if necessary.
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
## 2017-10-27 ENCOUNTER — Inpatient Hospital Stay (HOSPITAL_BASED_OUTPATIENT_CLINIC_OR_DEPARTMENT_OTHER): Payer: BLUE CROSS/BLUE SHIELD | Admitting: Oncology

## 2017-10-27 ENCOUNTER — Encounter: Payer: Self-pay | Admitting: Oncology

## 2017-10-27 ENCOUNTER — Other Ambulatory Visit: Payer: Self-pay

## 2017-10-27 ENCOUNTER — Inpatient Hospital Stay: Payer: BLUE CROSS/BLUE SHIELD

## 2017-10-27 ENCOUNTER — Telehealth: Payer: Self-pay | Admitting: Internal Medicine

## 2017-10-27 VITALS — BP 119/83 | HR 94 | Temp 97.9°F | Resp 18 | Ht 74.0 in | Wt 225.1 lb

## 2017-10-27 DIAGNOSIS — Z79899 Other long term (current) drug therapy: Secondary | ICD-10-CM

## 2017-10-27 DIAGNOSIS — C3431 Malignant neoplasm of lower lobe, right bronchus or lung: Secondary | ICD-10-CM | POA: Diagnosis not present

## 2017-10-27 DIAGNOSIS — C771 Secondary and unspecified malignant neoplasm of intrathoracic lymph nodes: Secondary | ICD-10-CM | POA: Diagnosis not present

## 2017-10-27 DIAGNOSIS — C3491 Malignant neoplasm of unspecified part of right bronchus or lung: Secondary | ICD-10-CM

## 2017-10-27 DIAGNOSIS — R5382 Chronic fatigue, unspecified: Secondary | ICD-10-CM

## 2017-10-27 DIAGNOSIS — Z5112 Encounter for antineoplastic immunotherapy: Secondary | ICD-10-CM

## 2017-10-27 LAB — COMPREHENSIVE METABOLIC PANEL
ALT: 20 U/L (ref 0–55)
AST: 49 U/L — ABNORMAL HIGH (ref 5–34)
Albumin: 3.6 g/dL (ref 3.5–5.0)
Alkaline Phosphatase: 116 U/L (ref 40–150)
Anion gap: 15 — ABNORMAL HIGH (ref 3–11)
BUN: 25 mg/dL (ref 7–26)
CO2: 21 mmol/L — ABNORMAL LOW (ref 22–29)
Calcium: 8.1 mg/dL — ABNORMAL LOW (ref 8.4–10.4)
Chloride: 107 mmol/L (ref 98–109)
Creatinine, Ser: 1.45 mg/dL — ABNORMAL HIGH (ref 0.70–1.30)
GFR calc Af Amer: 58 mL/min — ABNORMAL LOW (ref >60)
GFR calc non Af Amer: 50 mL/min — ABNORMAL LOW (ref >60)
Glucose, Bld: 70 mg/dL (ref 70–140)
Potassium: 3.7 mmol/L (ref 3.5–5.1)
Sodium: 143 mmol/L (ref 136–145)
Total Bilirubin: 0.5 mg/dL (ref 0.2–1.2)
Total Protein: 8.5 g/dL — ABNORMAL HIGH (ref 6.4–8.3)

## 2017-10-27 LAB — CBC WITH DIFFERENTIAL/PLATELET
Basophils Absolute: 0.1 10*3/uL (ref 0.0–0.1)
Basophils Relative: 2 %
Eosinophils Absolute: 0.3 10*3/uL (ref 0.0–0.5)
Eosinophils Relative: 7 %
HCT: 35.5 % — ABNORMAL LOW (ref 38.4–49.9)
Hemoglobin: 11.9 g/dL — ABNORMAL LOW (ref 13.0–17.1)
Lymphocytes Relative: 44 %
Lymphs Abs: 1.5 10*3/uL (ref 0.9–3.3)
MCH: 32.1 pg (ref 27.2–33.4)
MCHC: 33.5 g/dL (ref 32.0–36.0)
MCV: 95.7 fL (ref 79.3–98.0)
Monocytes Absolute: 0.3 10*3/uL (ref 0.1–0.9)
Monocytes Relative: 7 %
Neutro Abs: 1.4 10*3/uL — ABNORMAL LOW (ref 1.5–6.5)
Neutrophils Relative %: 40 %
Platelets: 170 10*3/uL (ref 140–400)
RBC: 3.71 MIL/uL — ABNORMAL LOW (ref 4.20–5.82)
RDW: 15.4 % — ABNORMAL HIGH (ref 11.0–14.6)
WBC: 3.5 10*3/uL — ABNORMAL LOW (ref 4.0–10.3)

## 2017-10-27 LAB — TSH: TSH: 2.638 u[IU]/mL (ref 0.320–4.118)

## 2017-10-27 MED ORDER — SODIUM CHLORIDE 0.9 % IV SOLN
10.0000 mg/kg | Freq: Once | INTRAVENOUS | Status: AC
  Administered 2017-10-27: 1000 mg via INTRAVENOUS
  Filled 2017-10-27: qty 20

## 2017-10-27 MED ORDER — SODIUM CHLORIDE 0.9 % IV SOLN
Freq: Once | INTRAVENOUS | Status: AC
  Administered 2017-10-27: 10:00:00 via INTRAVENOUS

## 2017-10-27 NOTE — Patient Instructions (Addendum)
[facility] Discharge Instructions for Patients Receiving Chemotherapy  Today you received the following chemotherapy agent: Imfinzi.  To help prevent nausea and vomiting after your treatment, we encourage you to take your nausea medication as directed.  If you develop nausea and vomiting that is not controlled by your nausea medication, call the clinic.   BELOW ARE SYMPTOMS THAT SHOULD BE REPORTED IMMEDIATELY:  *FEVER GREATER THAN 100.5 F  *CHILLS WITH OR WITHOUT FEVER  NAUSEA AND VOMITING THAT IS NOT CONTROLLED WITH YOUR NAUSEA MEDICATION  *UNUSUAL SHORTNESS OF BREATH  *UNUSUAL BRUISING OR BLEEDING  TENDERNESS IN MOUTH AND THROAT WITH OR WITHOUT PRESENCE OF ULCERS  *URINARY PROBLEMS  *BOWEL PROBLEMS  UNUSUAL RASH Items with * indicate a potential emergency and should be followed up as soon as possible.  Feel free to call the clinic should you have any questions or concerns. The clinic phone number is [phone].  Please show the [place] at check-in to the Emergency Department and triage nurse.

## 2017-10-27 NOTE — Telephone Encounter (Signed)
Appointments scheduled AVS/Calendar printed per 4/25 los

## 2017-11-10 ENCOUNTER — Inpatient Hospital Stay: Payer: BLUE CROSS/BLUE SHIELD

## 2017-11-10 ENCOUNTER — Other Ambulatory Visit: Payer: Self-pay

## 2017-11-10 ENCOUNTER — Inpatient Hospital Stay: Payer: BLUE CROSS/BLUE SHIELD | Attending: Internal Medicine | Admitting: Oncology

## 2017-11-10 ENCOUNTER — Encounter: Payer: Self-pay | Admitting: Oncology

## 2017-11-10 VITALS — BP 134/94 | HR 94 | Temp 97.8°F | Resp 18 | Ht 74.0 in | Wt 226.8 lb

## 2017-11-10 DIAGNOSIS — Z794 Long term (current) use of insulin: Secondary | ICD-10-CM | POA: Diagnosis not present

## 2017-11-10 DIAGNOSIS — I10 Essential (primary) hypertension: Secondary | ICD-10-CM | POA: Insufficient documentation

## 2017-11-10 DIAGNOSIS — Z5112 Encounter for antineoplastic immunotherapy: Secondary | ICD-10-CM

## 2017-11-10 DIAGNOSIS — C3431 Malignant neoplasm of lower lobe, right bronchus or lung: Secondary | ICD-10-CM

## 2017-11-10 DIAGNOSIS — C771 Secondary and unspecified malignant neoplasm of intrathoracic lymph nodes: Secondary | ICD-10-CM | POA: Diagnosis not present

## 2017-11-10 DIAGNOSIS — Z79899 Other long term (current) drug therapy: Secondary | ICD-10-CM | POA: Insufficient documentation

## 2017-11-10 DIAGNOSIS — C3491 Malignant neoplasm of unspecified part of right bronchus or lung: Secondary | ICD-10-CM

## 2017-11-10 DIAGNOSIS — E119 Type 2 diabetes mellitus without complications: Secondary | ICD-10-CM

## 2017-11-10 DIAGNOSIS — R21 Rash and other nonspecific skin eruption: Secondary | ICD-10-CM | POA: Insufficient documentation

## 2017-11-10 LAB — CBC WITH DIFFERENTIAL/PLATELET
Basophils Absolute: 0 10*3/uL (ref 0.0–0.1)
Basophils Relative: 1 %
Eosinophils Absolute: 0.3 10*3/uL (ref 0.0–0.5)
Eosinophils Relative: 8 %
HCT: 34.8 % — ABNORMAL LOW (ref 38.4–49.9)
Hemoglobin: 11.9 g/dL — ABNORMAL LOW (ref 13.0–17.1)
Lymphocytes Relative: 28 %
Lymphs Abs: 1 10*3/uL (ref 0.9–3.3)
MCH: 33.1 pg (ref 27.2–33.4)
MCHC: 34.1 g/dL (ref 32.0–36.0)
MCV: 97 fL (ref 79.3–98.0)
Monocytes Absolute: 0.3 10*3/uL (ref 0.1–0.9)
Monocytes Relative: 9 %
Neutro Abs: 2 10*3/uL (ref 1.5–6.5)
Neutrophils Relative %: 54 %
Platelets: 167 10*3/uL (ref 140–400)
RBC: 3.58 MIL/uL — ABNORMAL LOW (ref 4.20–5.82)
RDW: 15.4 % — ABNORMAL HIGH (ref 11.0–14.6)
WBC: 3.6 10*3/uL — ABNORMAL LOW (ref 4.0–10.3)

## 2017-11-10 LAB — COMPREHENSIVE METABOLIC PANEL
ALT: 20 U/L (ref 0–55)
AST: 28 U/L (ref 5–34)
Albumin: 3.7 g/dL (ref 3.5–5.0)
Alkaline Phosphatase: 140 U/L (ref 40–150)
Anion gap: 9 (ref 3–11)
BUN: 19 mg/dL (ref 7–26)
CO2: 24 mmol/L (ref 22–29)
Calcium: 8.9 mg/dL (ref 8.4–10.4)
Chloride: 107 mmol/L (ref 98–109)
Creatinine, Ser: 1.64 mg/dL — ABNORMAL HIGH (ref 0.70–1.30)
GFR calc Af Amer: 50 mL/min — ABNORMAL LOW (ref >60)
GFR calc non Af Amer: 43 mL/min — ABNORMAL LOW (ref >60)
Glucose, Bld: 102 mg/dL (ref 70–140)
Potassium: 3.7 mmol/L (ref 3.5–5.1)
Sodium: 140 mmol/L (ref 136–145)
Total Bilirubin: 0.4 mg/dL (ref 0.2–1.2)
Total Protein: 8.5 g/dL — ABNORMAL HIGH (ref 6.4–8.3)

## 2017-11-10 MED ORDER — SODIUM CHLORIDE 0.9 % IV SOLN
Freq: Once | INTRAVENOUS | Status: AC
  Administered 2017-11-10: 11:00:00 via INTRAVENOUS

## 2017-11-10 MED ORDER — SODIUM CHLORIDE 0.9 % IV SOLN
10.0000 mg/kg | Freq: Once | INTRAVENOUS | Status: AC
  Administered 2017-11-10: 1000 mg via INTRAVENOUS
  Filled 2017-11-10: qty 20

## 2017-11-10 NOTE — Patient Instructions (Signed)
[facility] Discharge Instructions for Patients Receiving Chemotherapy  Today you received the following chemotherapy agent: Imfinzi.  To help prevent nausea and vomiting after your treatment, we encourage you to take your nausea medication as directed.  If you develop nausea and vomiting that is not controlled by your nausea medication, call the clinic.   BELOW ARE SYMPTOMS THAT SHOULD BE REPORTED IMMEDIATELY:  *FEVER GREATER THAN 100.5 F  *CHILLS WITH OR WITHOUT FEVER  NAUSEA AND VOMITING THAT IS NOT CONTROLLED WITH YOUR NAUSEA MEDICATION  *UNUSUAL SHORTNESS OF BREATH  *UNUSUAL BRUISING OR BLEEDING  TENDERNESS IN MOUTH AND THROAT WITH OR WITHOUT PRESENCE OF ULCERS  *URINARY PROBLEMS  *BOWEL PROBLEMS  UNUSUAL RASH Items with * indicate a potential emergency and should be followed up as soon as possible.  Feel free to call the clinic should you have any questions or concerns. The clinic phone number is [phone].  Please show the [place] [facility] at check-in to the Emergency Department and triage nurse.

## 2017-11-10 NOTE — Progress Notes (Signed)
Per Dr Julien Nordmann ok to tx with elevated BP 134/94.

## 2017-11-10 NOTE — Progress Notes (Signed)
Per Dr Julien Nordmann ok to tx with creatinine of 1.64

## 2017-11-10 NOTE — Assessment & Plan Note (Addendum)
This is a very pleasant 62 year old African-American male with a stage IIIa non-small cell lung cancer, squamous cell carcinoma status post course of concurrent chemoradiation with weekly carboplatin and paclitaxel for 5 cycles with partial response. He is currently undergoing treatment with consolidation immunotherapy with Imfinzi (Durvalumab) status post 21cycles.He continues to tolerate this treatment very well. I recommended for the patient to proceed with cycle #22 today as a scheduled. Okay to treat creatinine of 1.64. Okay to proceed with Infinzi as scheduled. He will come back for follow-up visit in 2 weeks for evaluation before the next cycle of his treatment.  The patient has been summoned to jury duty on the date of his next treatment.  A note has been written to excuse the patient from jury duty on that date.  The patient voices understanding of current disease status and treatment options and is in agreement with the current care plan. All questions were answered. The patient knows to call the clinic with any problems, questions or concerns. We can certainly see the patient much sooner if necessary.
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
## 2017-11-11 ENCOUNTER — Telehealth: Payer: Self-pay | Admitting: *Deleted

## 2017-11-11 NOTE — Telephone Encounter (Signed)
Reviewed pt's call with [name] Downer, NP: Pt is not taking any narcotics. He is requesting to go back to work full time. 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

## 2017-11-11 NOTE — Telephone Encounter (Signed)
Call from pt requesting Dr. Julien [name] call to inform his boss that he is cleared to return to work. Requested he call back with a fax # to send the return to work note.

## 2017-11-11 NOTE — Telephone Encounter (Addendum)
Pt called back with fax # for his boss. [name] fax:[phone]. He is requesting a note to be faxed today so he may return to work on 5/13.

## 2017-11-14 ENCOUNTER — Encounter: Payer: Self-pay | Admitting: *Deleted

## 2017-11-14 ENCOUNTER — Encounter: Payer: Self-pay | Admitting: Oncology

## 2017-11-14 NOTE — Telephone Encounter (Signed)
Ok to return to work as tolerated.

## 2017-11-24 ENCOUNTER — Encounter: Payer: Self-pay | Admitting: Internal Medicine

## 2017-11-24 ENCOUNTER — Telehealth: Payer: Self-pay

## 2017-11-24 ENCOUNTER — Inpatient Hospital Stay: Payer: BLUE CROSS/BLUE SHIELD | Admitting: Internal Medicine

## 2017-11-24 ENCOUNTER — Inpatient Hospital Stay: Payer: BLUE CROSS/BLUE SHIELD

## 2017-11-24 VITALS — BP 134/91 | HR 82 | Temp 98.4°F | Resp 19 | Ht 74.0 in | Wt 228.4 lb

## 2017-11-24 DIAGNOSIS — C3491 Malignant neoplasm of unspecified part of right bronchus or lung: Secondary | ICD-10-CM

## 2017-11-24 DIAGNOSIS — C771 Secondary and unspecified malignant neoplasm of intrathoracic lymph nodes: Secondary | ICD-10-CM

## 2017-11-24 DIAGNOSIS — I10 Essential (primary) hypertension: Secondary | ICD-10-CM

## 2017-11-24 DIAGNOSIS — Z5112 Encounter for antineoplastic immunotherapy: Secondary | ICD-10-CM

## 2017-11-24 DIAGNOSIS — C3431 Malignant neoplasm of lower lobe, right bronchus or lung: Secondary | ICD-10-CM

## 2017-11-24 DIAGNOSIS — Z79899 Other long term (current) drug therapy: Secondary | ICD-10-CM

## 2017-11-24 DIAGNOSIS — R21 Rash and other nonspecific skin eruption: Secondary | ICD-10-CM | POA: Diagnosis not present

## 2017-11-24 LAB — COMPREHENSIVE METABOLIC PANEL
ALT: 24 U/L (ref 0–55)
AST: 35 U/L — ABNORMAL HIGH (ref 5–34)
Albumin: 3.7 g/dL (ref 3.5–5.0)
Alkaline Phosphatase: 132 U/L (ref 40–150)
Anion gap: 10 (ref 3–11)
BUN: 27 mg/dL — ABNORMAL HIGH (ref 7–26)
CO2: 21 mmol/L — ABNORMAL LOW (ref 22–29)
Calcium: 8.1 mg/dL — ABNORMAL LOW (ref 8.4–10.4)
Chloride: 110 mmol/L — ABNORMAL HIGH (ref 98–109)
Creatinine, Ser: 1.27 mg/dL (ref 0.70–1.30)
GFR calc Af Amer: >60 mL/min (ref >60)
GFR calc non Af Amer: 58 mL/min — ABNORMAL LOW (ref >60)
Glucose, Bld: 85 mg/dL (ref 70–140)
Potassium: 3.6 mmol/L (ref 3.5–5.1)
Sodium: 141 mmol/L (ref 136–145)
Total Bilirubin: 0.4 mg/dL (ref 0.2–1.2)
Total Protein: 8.4 g/dL — ABNORMAL HIGH (ref 6.4–8.3)

## 2017-11-24 LAB — CBC WITH DIFFERENTIAL/PLATELET
Basophils Absolute: 0 10*3/uL (ref 0.0–0.1)
Basophils Relative: 1 %
Eosinophils Absolute: 0.2 10*3/uL (ref 0.0–0.5)
Eosinophils Relative: 6 %
HCT: 35.4 % — ABNORMAL LOW (ref 38.4–49.9)
Hemoglobin: 11.9 g/dL — ABNORMAL LOW (ref 13.0–17.1)
Lymphocytes Relative: 27 %
Lymphs Abs: 1 10*3/uL (ref 0.9–3.3)
MCH: 32.7 pg (ref 27.2–33.4)
MCHC: 33.6 g/dL (ref 32.0–36.0)
MCV: 97.3 fL (ref 79.3–98.0)
Monocytes Absolute: 0.3 10*3/uL (ref 0.1–0.9)
Monocytes Relative: 9 %
Neutro Abs: 2.2 10*3/uL (ref 1.5–6.5)
Neutrophils Relative %: 57 %
Platelets: 165 10*3/uL (ref 140–400)
RBC: 3.64 MIL/uL — ABNORMAL LOW (ref 4.20–5.82)
RDW: 14.3 % (ref 11.0–14.6)
WBC: 3.8 10*3/uL — ABNORMAL LOW (ref 4.0–10.3)

## 2017-11-24 LAB — TSH: TSH: 0.932 u[IU]/mL (ref 0.320–4.118)

## 2017-11-24 MED ORDER — SODIUM CHLORIDE 0.9 % IV SOLN
10.0000 mg/kg | Freq: Once | INTRAVENOUS | Status: AC
  Administered 2017-11-24: 1000 mg via INTRAVENOUS
  Filled 2017-11-24: qty 20

## 2017-11-24 MED ORDER — SODIUM CHLORIDE 0.9 % IV SOLN
Freq: Once | INTRAVENOUS | Status: AC
  Administered 2017-11-24: 12:00:00 via INTRAVENOUS

## 2017-11-24 NOTE — Patient Instructions (Signed)
[facility] Discharge Instructions for Patients Receiving Chemotherapy  Today you received the following chemotherapy agents: Imfinzi.  To help prevent nausea and vomiting after your treatment, we encourage you to take your nausea medication as directed.   If you develop nausea and vomiting that is not controlled by your nausea medication, call the clinic.   BELOW ARE SYMPTOMS THAT SHOULD BE REPORTED IMMEDIATELY:  *FEVER GREATER THAN 100.5 F  *CHILLS WITH OR WITHOUT FEVER  NAUSEA AND VOMITING THAT IS NOT CONTROLLED WITH YOUR NAUSEA MEDICATION  *UNUSUAL SHORTNESS OF BREATH  *UNUSUAL BRUISING OR BLEEDING  TENDERNESS IN MOUTH AND THROAT WITH OR WITHOUT PRESENCE OF ULCERS  *URINARY PROBLEMS  *BOWEL PROBLEMS  UNUSUAL RASH Items with * indicate a potential emergency and should be followed up as soon as possible.  Feel free to call the clinic should you have any questions or concerns. The clinic phone number is [phone].  Please show the CHEMO ALERT CARD at check-in to the Emergency Department and triage nurse.   

## 2017-11-24 NOTE — Telephone Encounter (Signed)
Patient was already scheduled for upcoming appointments (3 weeks out) printed avs and calender. Per 5/23 los
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
## 2017-12-08 ENCOUNTER — Encounter: Payer: Self-pay | Admitting: Internal Medicine

## 2017-12-08 ENCOUNTER — Inpatient Hospital Stay: Payer: BLUE CROSS/BLUE SHIELD

## 2017-12-08 ENCOUNTER — Inpatient Hospital Stay: Payer: BLUE CROSS/BLUE SHIELD | Attending: Internal Medicine

## 2017-12-08 ENCOUNTER — Inpatient Hospital Stay (HOSPITAL_BASED_OUTPATIENT_CLINIC_OR_DEPARTMENT_OTHER): Payer: BLUE CROSS/BLUE SHIELD | Admitting: Internal Medicine

## 2017-12-08 ENCOUNTER — Telehealth: Payer: Self-pay | Admitting: Internal Medicine

## 2017-12-08 VITALS — BP 122/87 | HR 86 | Temp 98.7°F | Resp 18 | Ht 74.0 in | Wt 226.0 lb

## 2017-12-08 DIAGNOSIS — F17210 Nicotine dependence, cigarettes, uncomplicated: Secondary | ICD-10-CM | POA: Insufficient documentation

## 2017-12-08 DIAGNOSIS — Z79899 Other long term (current) drug therapy: Secondary | ICD-10-CM

## 2017-12-08 DIAGNOSIS — C3431 Malignant neoplasm of lower lobe, right bronchus or lung: Secondary | ICD-10-CM

## 2017-12-08 DIAGNOSIS — I10 Essential (primary) hypertension: Secondary | ICD-10-CM | POA: Diagnosis not present

## 2017-12-08 DIAGNOSIS — Z5112 Encounter for antineoplastic immunotherapy: Secondary | ICD-10-CM | POA: Diagnosis not present

## 2017-12-08 DIAGNOSIS — Z716 Tobacco abuse counseling: Secondary | ICD-10-CM

## 2017-12-08 DIAGNOSIS — C3491 Malignant neoplasm of unspecified part of right bronchus or lung: Secondary | ICD-10-CM

## 2017-12-08 DIAGNOSIS — E119 Type 2 diabetes mellitus without complications: Secondary | ICD-10-CM | POA: Insufficient documentation

## 2017-12-08 LAB — CBC WITH DIFFERENTIAL/PLATELET
Basophils Absolute: 0 10*3/uL (ref 0.0–0.1)
Basophils Relative: 1 %
Eosinophils Absolute: 0.2 10*3/uL (ref 0.0–0.5)
Eosinophils Relative: 7 %
HCT: 36.7 % — ABNORMAL LOW (ref 38.4–49.9)
Hemoglobin: 12.2 g/dL — ABNORMAL LOW (ref 13.0–17.1)
Lymphocytes Relative: 29 %
Lymphs Abs: 0.9 10*3/uL (ref 0.9–3.3)
MCH: 32.7 pg (ref 27.2–33.4)
MCHC: 33.2 g/dL (ref 32.0–36.0)
MCV: 98.4 fL — ABNORMAL HIGH (ref 79.3–98.0)
Monocytes Absolute: 0.3 10*3/uL (ref 0.1–0.9)
Monocytes Relative: 9 %
Neutro Abs: 1.7 10*3/uL (ref 1.5–6.5)
Neutrophils Relative %: 54 %
Platelets: 193 10*3/uL (ref 140–400)
RBC: 3.73 MIL/uL — ABNORMAL LOW (ref 4.20–5.82)
RDW: 14.4 % (ref 11.0–14.6)
WBC: 3.1 10*3/uL — ABNORMAL LOW (ref 4.0–10.3)

## 2017-12-08 LAB — COMPREHENSIVE METABOLIC PANEL
ALT: 20 U/L (ref 0–55)
AST: 26 U/L (ref 5–34)
Albumin: 3.6 g/dL (ref 3.5–5.0)
Alkaline Phosphatase: 143 U/L (ref 40–150)
Anion gap: 10 (ref 3–11)
BUN: 15 mg/dL (ref 7–26)
CO2: 22 mmol/L (ref 22–29)
Calcium: 8.4 mg/dL (ref 8.4–10.4)
Chloride: 108 mmol/L (ref 98–109)
Creatinine, Ser: 1.06 mg/dL (ref 0.70–1.30)
GFR calc Af Amer: >60 mL/min (ref >60)
GFR calc non Af Amer: >60 mL/min (ref >60)
Glucose, Bld: 93 mg/dL (ref 70–140)
Potassium: 3.5 mmol/L (ref 3.5–5.1)
Sodium: 140 mmol/L (ref 136–145)
Total Bilirubin: 0.7 mg/dL (ref 0.2–1.2)
Total Protein: 8.5 g/dL — ABNORMAL HIGH (ref 6.4–8.3)

## 2017-12-08 LAB — TSH: TSH: 1.978 u[IU]/mL (ref 0.320–4.118)

## 2017-12-08 MED ORDER — SODIUM CHLORIDE 0.9 % IV SOLN
Freq: Once | INTRAVENOUS | Status: AC
  Administered 2017-12-08: 10:00:00 via INTRAVENOUS

## 2017-12-08 MED ORDER — SODIUM CHLORIDE 0.9 % IV SOLN
10.0000 mg/kg | Freq: Once | INTRAVENOUS | Status: AC
  Administered 2017-12-08: 1000 mg via INTRAVENOUS
  Filled 2017-12-08: qty 20

## 2017-12-08 MED ORDER — VARENICLINE TARTRATE 1 MG PO TABS: 1.0000 mg | ORAL_TABLET | Freq: Two times a day (BID) | ORAL | 1 refills | Status: DC

## 2017-12-08 NOTE — Patient Instructions (Signed)
[facility] Discharge Instructions for Patients Receiving Chemotherapy  Today you received the following chemotherapy agents:  Imfinzi  To help prevent nausea and vomiting after your treatment, we encourage you to take your nausea medication as prescribed.   If you develop nausea and vomiting that is not controlled by your nausea medication, call the clinic.   BELOW ARE SYMPTOMS THAT SHOULD BE REPORTED IMMEDIATELY:  *FEVER GREATER THAN 100.5 F  *CHILLS WITH OR WITHOUT FEVER  NAUSEA AND VOMITING THAT IS NOT CONTROLLED WITH YOUR NAUSEA MEDICATION  *UNUSUAL SHORTNESS OF BREATH  *UNUSUAL BRUISING OR BLEEDING  TENDERNESS IN MOUTH AND THROAT WITH OR WITHOUT PRESENCE OF ULCERS  *URINARY PROBLEMS  *BOWEL PROBLEMS  UNUSUAL RASH Items with * indicate a potential emergency and should be followed up as soon as possible.  Feel free to call the clinic should you have any questions or concerns. The clinic phone number is [phone].  Please show the [place] at check-in to the Emergency Department and triage nurse.

## 2017-12-08 NOTE — Telephone Encounter (Signed)
Scheduled appt per 6/6 los - gave patient AVS and calender per los.
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
## 2017-12-22 ENCOUNTER — Other Ambulatory Visit: Payer: Self-pay | Admitting: Oncology

## 2017-12-22 ENCOUNTER — Inpatient Hospital Stay: Payer: BLUE CROSS/BLUE SHIELD

## 2017-12-22 ENCOUNTER — Telehealth: Payer: Self-pay

## 2017-12-22 ENCOUNTER — Inpatient Hospital Stay (HOSPITAL_BASED_OUTPATIENT_CLINIC_OR_DEPARTMENT_OTHER): Payer: BLUE CROSS/BLUE SHIELD | Admitting: Internal Medicine

## 2017-12-22 ENCOUNTER — Encounter: Payer: Self-pay | Admitting: Internal Medicine

## 2017-12-22 VITALS — BP 127/85 | HR 89 | Temp 97.9°F | Resp 18 | Ht 74.0 in | Wt 221.8 lb

## 2017-12-22 DIAGNOSIS — F17210 Nicotine dependence, cigarettes, uncomplicated: Secondary | ICD-10-CM | POA: Diagnosis not present

## 2017-12-22 DIAGNOSIS — C3431 Malignant neoplasm of lower lobe, right bronchus or lung: Secondary | ICD-10-CM | POA: Diagnosis not present

## 2017-12-22 DIAGNOSIS — C771 Secondary and unspecified malignant neoplasm of intrathoracic lymph nodes: Secondary | ICD-10-CM

## 2017-12-22 DIAGNOSIS — Z5112 Encounter for antineoplastic immunotherapy: Secondary | ICD-10-CM

## 2017-12-22 DIAGNOSIS — Z79899 Other long term (current) drug therapy: Secondary | ICD-10-CM

## 2017-12-22 DIAGNOSIS — C3491 Malignant neoplasm of unspecified part of right bronchus or lung: Secondary | ICD-10-CM

## 2017-12-22 LAB — CBC WITH DIFFERENTIAL (CANCER CENTER ONLY)
Basophils Absolute: 0.1 10*3/uL (ref 0.0–0.1)
Basophils Relative: 1 %
Eosinophils Absolute: 0.3 10*3/uL (ref 0.0–0.5)
Eosinophils Relative: 8 %
HCT: 35.5 % — ABNORMAL LOW (ref 38.4–49.9)
Hemoglobin: 12 g/dL — ABNORMAL LOW (ref 13.0–17.1)
Lymphocytes Relative: 22 %
Lymphs Abs: 0.8 10*3/uL — ABNORMAL LOW (ref 0.9–3.3)
MCH: 33.1 pg (ref 27.2–33.4)
MCHC: 33.8 g/dL (ref 32.0–36.0)
MCV: 97.8 fL (ref 79.3–98.0)
Monocytes Absolute: 0.3 10*3/uL (ref 0.1–0.9)
Monocytes Relative: 9 %
Neutro Abs: 2.1 10*3/uL (ref 1.5–6.5)
Neutrophils Relative %: 60 %
Platelet Count: 170 10*3/uL (ref 140–400)
RBC: 3.63 MIL/uL — ABNORMAL LOW (ref 4.20–5.82)
RDW: 13.8 % (ref 11.0–14.6)
WBC Count: 3.5 10*3/uL — ABNORMAL LOW (ref 4.0–10.3)

## 2017-12-22 LAB — CMP (CANCER CENTER ONLY)
ALT: 25 U/L (ref 0–55)
AST: 41 U/L — ABNORMAL HIGH (ref 5–34)
Albumin: 3.6 g/dL (ref 3.5–5.0)
Alkaline Phosphatase: 160 U/L — ABNORMAL HIGH (ref 40–150)
Anion gap: 11 (ref 3–11)
BUN: 13 mg/dL (ref 7–26)
CO2: 25 mmol/L (ref 22–29)
Calcium: 9 mg/dL (ref 8.4–10.4)
Chloride: 104 mmol/L (ref 98–109)
Creatinine: 1.11 mg/dL (ref 0.70–1.30)
GFR, Est AFR Am: >60 mL/min (ref >60)
GFR, Estimated: >60 mL/min (ref >60)
Glucose, Bld: 97 mg/dL (ref 70–140)
Potassium: 3.2 mmol/L — ABNORMAL LOW (ref 3.5–5.1)
Sodium: 140 mmol/L (ref 136–145)
Total Bilirubin: 0.4 mg/dL (ref 0.2–1.2)
Total Protein: 8.8 g/dL — ABNORMAL HIGH (ref 6.4–8.3)

## 2017-12-22 MED ORDER — SODIUM CHLORIDE 0.9 % IV SOLN
Freq: Once | INTRAVENOUS | Status: AC
  Administered 2017-12-22: 12:00:00 via INTRAVENOUS

## 2017-12-22 MED ORDER — POTASSIUM CHLORIDE CRYS ER 20 MEQ PO TBCR: 20.0000 meq | EXTENDED_RELEASE_TABLET | Freq: Every day | ORAL | 0 refills | Status: DC

## 2017-12-22 MED ORDER — SODIUM CHLORIDE 0.9 % IV SOLN
10.0000 mg/kg | Freq: Once | INTRAVENOUS | Status: AC
  Administered 2017-12-22: 1000 mg via INTRAVENOUS
  Filled 2017-12-22: qty 20
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
## 2017-12-22 NOTE — Patient Instructions (Signed)
[facility] Discharge Instructions for Patients Receiving Chemotherapy  Today you received the following chemotherapy agents:  Imfinzi  To help prevent nausea and vomiting after your treatment, we encourage you to take your nausea medication as prescribed.   If you develop nausea and vomiting that is not controlled by your nausea medication, call the clinic.   BELOW ARE SYMPTOMS THAT SHOULD BE REPORTED IMMEDIATELY:  *FEVER GREATER THAN 100.5 F  *CHILLS WITH OR WITHOUT FEVER  NAUSEA AND VOMITING THAT IS NOT CONTROLLED WITH YOUR NAUSEA MEDICATION  *UNUSUAL SHORTNESS OF BREATH  *UNUSUAL BRUISING OR BLEEDING  TENDERNESS IN MOUTH AND THROAT WITH OR WITHOUT PRESENCE OF ULCERS  *URINARY PROBLEMS  *BOWEL PROBLEMS  UNUSUAL RASH Items with * indicate a potential emergency and should be followed up as soon as possible.  Feel free to call the clinic should you have any questions or concerns. The clinic phone number is [phone].  Please show the [place] at check-in to the Emergency Department and triage nurse.

## 2017-12-22 NOTE — Telephone Encounter (Signed)
Printed avs and calender of upcoming appointment. Per 6/20 los unable to schedule appointment on Thursday due to no est avail.

## 2017-12-26 ENCOUNTER — Telehealth: Payer: Self-pay | Admitting: *Deleted

## 2017-12-26 NOTE — Telephone Encounter (Signed)
Called lm for patient advising that his potassium was low and that a prescription was called into his pharmacy and that he need to take for the next 7 days and that at his next lab appt we will recheck to see if it has improved.  Advised to call if he should have any questions.

## 2018-01-06 ENCOUNTER — Inpatient Hospital Stay: Payer: BLUE CROSS/BLUE SHIELD | Attending: Internal Medicine

## 2018-01-06 ENCOUNTER — Inpatient Hospital Stay (HOSPITAL_BASED_OUTPATIENT_CLINIC_OR_DEPARTMENT_OTHER): Payer: BLUE CROSS/BLUE SHIELD | Admitting: Oncology

## 2018-01-06 ENCOUNTER — Inpatient Hospital Stay: Payer: BLUE CROSS/BLUE SHIELD

## 2018-01-06 ENCOUNTER — Telehealth: Payer: Self-pay | Admitting: Oncology

## 2018-01-06 ENCOUNTER — Encounter: Payer: Self-pay | Admitting: Oncology

## 2018-01-06 VITALS — BP 124/85 | HR 91 | Temp 98.1°F | Resp 18 | Ht 74.0 in | Wt 216.4 lb

## 2018-01-06 DIAGNOSIS — I10 Essential (primary) hypertension: Secondary | ICD-10-CM | POA: Insufficient documentation

## 2018-01-06 DIAGNOSIS — F17210 Nicotine dependence, cigarettes, uncomplicated: Secondary | ICD-10-CM | POA: Insufficient documentation

## 2018-01-06 DIAGNOSIS — C771 Secondary and unspecified malignant neoplasm of intrathoracic lymph nodes: Secondary | ICD-10-CM | POA: Diagnosis not present

## 2018-01-06 DIAGNOSIS — C3431 Malignant neoplasm of lower lobe, right bronchus or lung: Secondary | ICD-10-CM

## 2018-01-06 DIAGNOSIS — C3491 Malignant neoplasm of unspecified part of right bronchus or lung: Secondary | ICD-10-CM

## 2018-01-06 DIAGNOSIS — E119 Type 2 diabetes mellitus without complications: Secondary | ICD-10-CM | POA: Insufficient documentation

## 2018-01-06 DIAGNOSIS — Z716 Tobacco abuse counseling: Secondary | ICD-10-CM

## 2018-01-06 DIAGNOSIS — Z5112 Encounter for antineoplastic immunotherapy: Secondary | ICD-10-CM

## 2018-01-06 LAB — COMPREHENSIVE METABOLIC PANEL
ALT: 27 U/L (ref 0–44)
AST: 38 U/L (ref 15–41)
Albumin: 3.8 g/dL (ref 3.5–5.0)
Alkaline Phosphatase: 131 U/L — ABNORMAL HIGH (ref 38–126)
Anion gap: 15 (ref 5–15)
BUN: 35 mg/dL — ABNORMAL HIGH (ref 8–23)
CO2: 22 mmol/L (ref 22–32)
Calcium: 8.8 mg/dL — ABNORMAL LOW (ref 8.9–10.3)
Chloride: 100 mmol/L (ref 98–111)
Creatinine, Ser: 1.73 mg/dL — ABNORMAL HIGH (ref 0.61–1.24)
GFR calc Af Amer: 47 mL/min — ABNORMAL LOW (ref >60)
GFR calc non Af Amer: 40 mL/min — ABNORMAL LOW (ref >60)
Glucose, Bld: 86 mg/dL (ref 70–99)
Potassium: 3.9 mmol/L (ref 3.5–5.1)
Sodium: 137 mmol/L (ref 135–145)
Total Bilirubin: 0.6 mg/dL (ref 0.3–1.2)
Total Protein: 8.5 g/dL — ABNORMAL HIGH (ref 6.5–8.1)

## 2018-01-06 LAB — CBC WITH DIFFERENTIAL (CANCER CENTER ONLY)
Basophils Absolute: 0.1 10*3/uL (ref 0.0–0.1)
Basophils Relative: 2 %
Eosinophils Absolute: 0.1 10*3/uL (ref 0.0–0.5)
Eosinophils Relative: 3 %
HCT: 35.1 % — ABNORMAL LOW (ref 38.4–49.9)
Hemoglobin: 11.8 g/dL — ABNORMAL LOW (ref 13.0–17.1)
Lymphocytes Relative: 19 %
Lymphs Abs: 0.8 10*3/uL — ABNORMAL LOW (ref 0.9–3.3)
MCH: 33.2 pg (ref 27.2–33.4)
MCHC: 33.6 g/dL (ref 32.0–36.0)
MCV: 98.9 fL — ABNORMAL HIGH (ref 79.3–98.0)
Monocytes Absolute: 0.3 10*3/uL (ref 0.1–0.9)
Monocytes Relative: 7 %
Neutro Abs: 3 10*3/uL (ref 1.5–6.5)
Neutrophils Relative %: 69 %
Platelet Count: 186 10*3/uL (ref 140–400)
RBC: 3.55 MIL/uL — ABNORMAL LOW (ref 4.20–5.82)
RDW: 14.1 % (ref 11.0–14.6)
WBC Count: 4.4 10*3/uL (ref 4.0–10.3)

## 2018-01-06 LAB — TSH: TSH: 2.2 u[IU]/mL (ref 0.320–4.118)

## 2018-01-06 MED ORDER — SODIUM CHLORIDE 0.9 % IV SOLN
Freq: Once | INTRAVENOUS | Status: AC
  Administered 2018-01-06: 10:00:00 via INTRAVENOUS

## 2018-01-06 MED ORDER — SODIUM CHLORIDE 0.9 % IV SOLN
10.0000 mg/kg | Freq: Once | INTRAVENOUS | Status: AC
  Administered 2018-01-06: 1000 mg via INTRAVENOUS
  Filled 2018-01-06: qty 20
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
## 2018-01-06 NOTE — Patient Instructions (Signed)
[facility] Discharge Instructions for Patients Receiving Chemotherapy  Today you received the following chemotherapy agents: Imfinzi.  To help prevent nausea and vomiting after your treatment, we encourage you to take your nausea medication as directed.   If you develop nausea and vomiting that is not controlled by your nausea medication, call the clinic.   BELOW ARE SYMPTOMS THAT SHOULD BE REPORTED IMMEDIATELY:  *FEVER GREATER THAN 100.5 F  *CHILLS WITH OR WITHOUT FEVER  NAUSEA AND VOMITING THAT IS NOT CONTROLLED WITH YOUR NAUSEA MEDICATION  *UNUSUAL SHORTNESS OF BREATH  *UNUSUAL BRUISING OR BLEEDING  TENDERNESS IN MOUTH AND THROAT WITH OR WITHOUT PRESENCE OF ULCERS  *URINARY PROBLEMS  *BOWEL PROBLEMS  UNUSUAL RASH Items with * indicate a potential emergency and should be followed up as soon as possible.  Feel free to call the clinic should you have any questions or concerns. The clinic phone number is [phone].  Please show the CHEMO ALERT CARD at check-in to the Emergency Department and triage nurse.   

## 2018-01-06 NOTE — Progress Notes (Signed)
Ok to treat-Per Cyril Mourning it is okay to treat pt today with Durvalumab and creatinine of 1.73.

## 2018-01-06 NOTE — Assessment & Plan Note (Signed)
This is a very pleasant 62 year old African-American male with a stage IIIa non-small cell lung cancer, squamous cell carcinoma status post course of concurrent chemoradiation with weekly carboplatin and paclitaxel for 5 cycles with partial response. He is currently undergoing treatment with consolidation immunotherapy with Imfinzi (Durvalumab) status post 25 cycles.  The patient continues to tolerate this treatment well.  I recommended for him to proceed with cycle #26 today as scheduled.  This will be his final treatment with Imfinzi. The patient will have a restaging CT scan of the chest in approximately 2 weeks. He will follow-up 1 to 2 days after the CT scan to discuss the results.  For the smoking cessation, he will continue his treatment with Chantix.  He continues to smoke cigarettes intermittently.  He was advised to call immediately if he has any concerning symptoms in the interval. The patient voices understanding of current disease status and treatment options and is in agreement with the current care plan. All questions were answered. The patient knows to call the clinic with any problems, questions or concerns. We can certainly see the patient much sooner if necessary.

## 2018-01-06 NOTE — Telephone Encounter (Signed)
Scheduled appt per 7/5 los - gave patient aVS and calender per los.

## 2018-01-12 ENCOUNTER — Emergency Department (HOSPITAL_COMMUNITY)
Admission: EM | Admit: 2018-01-12 | Discharge: 2018-01-12 | Disposition: A | Discharge status: Home / Self Care | Payer: BLUE CROSS/BLUE SHIELD | Attending: Emergency Medicine | Admitting: Emergency Medicine

## 2018-01-12 ENCOUNTER — Other Ambulatory Visit: Payer: Self-pay

## 2018-01-12 ENCOUNTER — Encounter (HOSPITAL_COMMUNITY): Payer: Self-pay | Admitting: Emergency Medicine

## 2018-01-12 DIAGNOSIS — Z85118 Personal history of other malignant neoplasm of bronchus and lung: Secondary | ICD-10-CM | POA: Diagnosis not present

## 2018-01-12 DIAGNOSIS — E119 Type 2 diabetes mellitus without complications: Secondary | ICD-10-CM | POA: Diagnosis not present

## 2018-01-12 DIAGNOSIS — Z720 Tobacco use: Secondary | ICD-10-CM

## 2018-01-12 DIAGNOSIS — Z79899 Other long term (current) drug therapy: Secondary | ICD-10-CM | POA: Insufficient documentation

## 2018-01-12 DIAGNOSIS — K029 Dental caries, unspecified: Secondary | ICD-10-CM | POA: Insufficient documentation

## 2018-01-12 DIAGNOSIS — K0889 Other specified disorders of teeth and supporting structures: Secondary | ICD-10-CM | POA: Diagnosis present

## 2018-01-12 DIAGNOSIS — K047 Periapical abscess without sinus: Secondary | ICD-10-CM

## 2018-01-12 DIAGNOSIS — Z87891 Personal history of nicotine dependence: Secondary | ICD-10-CM | POA: Insufficient documentation

## 2018-01-12 DIAGNOSIS — I10 Essential (primary) hypertension: Secondary | ICD-10-CM | POA: Diagnosis not present

## 2018-01-12 MED ORDER — HYDROCODONE-ACETAMINOPHEN 5-325 MG PO TABS
1.0000 | ORAL_TABLET | Freq: Once | ORAL | Status: AC
  Administered 2018-01-12: 1 via ORAL
  Filled 2018-01-12: qty 1

## 2018-01-12 MED ORDER — PENICILLIN V POTASSIUM 500 MG PO TABS: 1000.0000 mg | ORAL_TABLET | Freq: Two times a day (BID) | ORAL | 0 refills | Status: DC

## 2018-01-12 MED ORDER — LIDOCAINE VISCOUS HCL 2 % MT SOLN: 15.0000 mL | OROMUCOSAL | 0 refills | Status: DC | PRN

## 2018-01-12 NOTE — Discharge Instructions (Signed)
Apply warm compresses to jaw throughout the day. Take antibiotic until finished. Use lidocaine swish to help with pain. Alternate between tylenol and motrin as needed for pain. Perform salt water swishes to help with pain/swelling. Use over the counter oragel as needed for additional relief. STOP SMOKING! Followup with a dentist is very important for ongoing evaluation and management of recurrent dental pain, call the dentist listed above in the next 24-48 hours to schedule ongoing dental care, or use the list below to find a dentist in the next 24-48 hours for ongoing management of your dental issue. Return to emergency department for emergent changing or worsening symptoms.

## 2018-01-12 NOTE — ED Triage Notes (Addendum)
Pt from home with c/o dental pain that began yesterday. Pt states he is currently trying to find a new dentist. Pt is undergoing chemo but will be finished with it on Friday. Pt has a pulse rate of 101 but is afebrile. Pt reports pain in upper left jaw. There appears to be dental decay in specified area.
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
## 2018-01-13 ENCOUNTER — Other Ambulatory Visit: Payer: Self-pay

## 2018-01-13 ENCOUNTER — Telehealth: Payer: Self-pay | Admitting: Family Medicine

## 2018-01-13 ENCOUNTER — Encounter (HOSPITAL_COMMUNITY): Payer: Self-pay | Admitting: *Deleted

## 2018-01-13 ENCOUNTER — Ambulatory Visit (HOSPITAL_COMMUNITY)
Admission: EM | Admit: 2018-01-13 | Discharge: 2018-01-13 | Disposition: A | Discharge status: Home / Self Care | Payer: BLUE CROSS/BLUE SHIELD | Attending: Family Medicine | Admitting: Family Medicine

## 2018-01-13 DIAGNOSIS — K0889 Other specified disorders of teeth and supporting structures: Secondary | ICD-10-CM | POA: Diagnosis not present

## 2018-01-13 DIAGNOSIS — K047 Periapical abscess without sinus: Secondary | ICD-10-CM

## 2018-01-13 DIAGNOSIS — R220 Localized swelling, mass and lump, head: Secondary | ICD-10-CM

## 2018-01-13 MED ORDER — HYDROCODONE-ACETAMINOPHEN 5-325 MG PO TABS
ORAL_TABLET | ORAL | Status: AC
  Filled 2018-01-13: qty 1

## 2018-01-13 MED ORDER — HYDROCODONE-ACETAMINOPHEN 7.5-325 MG PO TABS: 1.0000 | ORAL_TABLET | Freq: Four times a day (QID) | ORAL | 0 refills | Status: DC | PRN

## 2018-01-13 MED ORDER — CEFTRIAXONE SODIUM 250 MG IJ SOLR
250.0000 mg | Freq: Once | INTRAMUSCULAR | Status: AC
  Administered 2018-01-13: 250 mg via INTRAMUSCULAR

## 2018-01-13 MED ORDER — CEFTRIAXONE SODIUM 250 MG IJ SOLR
INTRAMUSCULAR | Status: AC
  Filled 2018-01-13: qty 250

## 2018-01-13 MED ORDER — HYDROCODONE-ACETAMINOPHEN 5-325 MG PO TABS
1.0000 | ORAL_TABLET | Freq: Once | ORAL | Status: AC
  Administered 2018-01-13: 1 via ORAL

## 2018-01-13 NOTE — Telephone Encounter (Signed)
After looking at the message, pt noted to be at the Urgent Care. Spoke to patient and he verified this to be correct. Pt advised that he could call on Monday morning to see if there were any no show appointments. He verbalized understanding

## 2018-01-13 NOTE — Telephone Encounter (Signed)
Patient stopped by the office today and requested for pain medication for his infected tooth, the patient stated that the tylenol is no help for the pain. Patient stated he has been taking meds as instructed from ED and it is not helping. Patient stated that his left jaw is now swollen twice the size is was yesterday.

## 2018-01-13 NOTE — ED Triage Notes (Signed)
C/o dental pain states he as several bad teeth. Seen in ED for same, states he can't find a dentist.

## 2018-01-13 NOTE — Discharge Instructions (Addendum)
You may take extra strength Tylenol or ibuprofen for mild pain. Continue taking penicillin 4 pills a day until gone Call for dental appointment  Take the hydrocodone As needed for severe pain. Hydrocodone can cause constipation.  Take with food.  Take the stool softener if needed Hydrocodone can cause drowsiness.  Do not take this and try to drive or operate machinery
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
## 2018-01-16 ENCOUNTER — Ambulatory Visit (HOSPITAL_COMMUNITY)
Admission: RE | Admit: 2018-01-16 | Discharge: 2018-01-16 | Disposition: A | Discharge status: Home / Self Care | Payer: BLUE CROSS/BLUE SHIELD | Source: Ambulatory Visit | Attending: Oncology | Admitting: Oncology

## 2018-01-16 ENCOUNTER — Encounter (HOSPITAL_COMMUNITY): Payer: Self-pay

## 2018-01-16 DIAGNOSIS — Z9889 Other specified postprocedural states: Secondary | ICD-10-CM | POA: Insufficient documentation

## 2018-01-16 DIAGNOSIS — J438 Other emphysema: Secondary | ICD-10-CM | POA: Insufficient documentation

## 2018-01-16 DIAGNOSIS — J8410 Pulmonary fibrosis, unspecified: Secondary | ICD-10-CM | POA: Insufficient documentation

## 2018-01-16 DIAGNOSIS — C3491 Malignant neoplasm of unspecified part of right bronchus or lung: Secondary | ICD-10-CM

## 2018-01-16 DIAGNOSIS — J432 Centrilobular emphysema: Secondary | ICD-10-CM | POA: Insufficient documentation

## 2018-01-16 DIAGNOSIS — I700 Atherosclerosis of aorta: Secondary | ICD-10-CM | POA: Diagnosis not present

## 2018-01-16 MED ORDER — IOHEXOL 300 MG/ML  SOLN
75.0000 mL | Freq: Once | INTRAMUSCULAR | Status: AC | PRN
  Administered 2018-01-16: 75 mL via INTRAVENOUS
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
## 2018-01-19 ENCOUNTER — Ambulatory Visit: Payer: BLUE CROSS/BLUE SHIELD | Admitting: Nurse Practitioner

## 2018-01-19 ENCOUNTER — Telehealth: Payer: Self-pay

## 2018-01-19 ENCOUNTER — Ambulatory Visit: Payer: BLUE CROSS/BLUE SHIELD

## 2018-01-19 ENCOUNTER — Other Ambulatory Visit: Payer: BLUE CROSS/BLUE SHIELD

## 2018-01-19 ENCOUNTER — Inpatient Hospital Stay: Payer: BLUE CROSS/BLUE SHIELD | Admitting: Nurse Practitioner

## 2018-01-19 ENCOUNTER — Inpatient Hospital Stay: Payer: BLUE CROSS/BLUE SHIELD

## 2018-01-19 ENCOUNTER — Encounter: Payer: Self-pay | Admitting: Nurse Practitioner

## 2018-01-19 VITALS — BP 106/80 | HR 100 | Temp 98.3°F | Resp 18 | Ht 74.0 in | Wt 219.6 lb

## 2018-01-19 DIAGNOSIS — C771 Secondary and unspecified malignant neoplasm of intrathoracic lymph nodes: Secondary | ICD-10-CM | POA: Diagnosis not present

## 2018-01-19 DIAGNOSIS — E119 Type 2 diabetes mellitus without complications: Secondary | ICD-10-CM

## 2018-01-19 DIAGNOSIS — C3491 Malignant neoplasm of unspecified part of right bronchus or lung: Secondary | ICD-10-CM

## 2018-01-19 DIAGNOSIS — F17210 Nicotine dependence, cigarettes, uncomplicated: Secondary | ICD-10-CM | POA: Diagnosis not present

## 2018-01-19 DIAGNOSIS — I10 Essential (primary) hypertension: Secondary | ICD-10-CM

## 2018-01-19 DIAGNOSIS — C3431 Malignant neoplasm of lower lobe, right bronchus or lung: Secondary | ICD-10-CM | POA: Diagnosis not present

## 2018-01-19 DIAGNOSIS — C3490 Malignant neoplasm of unspecified part of unspecified bronchus or lung: Secondary | ICD-10-CM

## 2018-01-19 LAB — CBC WITH DIFFERENTIAL (CANCER CENTER ONLY)
Basophils Absolute: 0.1 10*3/uL (ref 0.0–0.1)
Basophils Relative: 2 %
Eosinophils Absolute: 0.2 10*3/uL (ref 0.0–0.5)
Eosinophils Relative: 7 %
HCT: 35.2 % — ABNORMAL LOW (ref 38.4–49.9)
Hemoglobin: 11.6 g/dL — ABNORMAL LOW (ref 13.0–17.1)
Lymphocytes Relative: 38 %
Lymphs Abs: 1.2 10*3/uL (ref 0.9–3.3)
MCH: 32.2 pg (ref 27.2–33.4)
MCHC: 33 g/dL (ref 32.0–36.0)
MCV: 97.8 fL (ref 79.3–98.0)
Monocytes Absolute: 0.2 10*3/uL (ref 0.1–0.9)
Monocytes Relative: 8 %
Neutro Abs: 1.4 10*3/uL — ABNORMAL LOW (ref 1.5–6.5)
Neutrophils Relative %: 45 %
Platelet Count: 234 10*3/uL (ref 140–400)
RBC: 3.6 MIL/uL — ABNORMAL LOW (ref 4.20–5.82)
RDW: 14 % (ref 11.0–14.6)
WBC Count: 3.1 10*3/uL — ABNORMAL LOW (ref 4.0–10.3)

## 2018-01-19 LAB — CMP (CANCER CENTER ONLY)
ALT: 34 U/L (ref 0–44)
AST: 30 U/L (ref 15–41)
Albumin: 3.3 g/dL — ABNORMAL LOW (ref 3.5–5.0)
Alkaline Phosphatase: 166 U/L — ABNORMAL HIGH (ref 38–126)
Anion gap: 9 (ref 5–15)
BUN: 16 mg/dL (ref 8–23)
CO2: 25 mmol/L (ref 22–32)
Calcium: 9.1 mg/dL (ref 8.9–10.3)
Chloride: 107 mmol/L (ref 98–111)
Creatinine: 1.26 mg/dL — ABNORMAL HIGH (ref 0.61–1.24)
GFR, Est AFR Am: >60 mL/min (ref >60)
GFR, Estimated: 59 mL/min — ABNORMAL LOW (ref >60)
Glucose, Bld: 115 mg/dL — ABNORMAL HIGH (ref 70–99)
Potassium: 4 mmol/L (ref 3.5–5.1)
Sodium: 141 mmol/L (ref 135–145)
Total Bilirubin: 0.3 mg/dL (ref 0.3–1.2)
Total Protein: 8.3 g/dL — ABNORMAL HIGH (ref 6.5–8.1)

## 2018-01-19 NOTE — Telephone Encounter (Signed)
Printed avs and calender of upcoming appointment. Per 7/18 los 

## 2018-01-20 ENCOUNTER — Ambulatory Visit: Payer: BLUE CROSS/BLUE SHIELD

## 2018-01-20 ENCOUNTER — Other Ambulatory Visit: Payer: BLUE CROSS/BLUE SHIELD

## 2018-01-20 ENCOUNTER — Ambulatory Visit: Payer: BLUE CROSS/BLUE SHIELD | Admitting: Oncology

## 2018-02-02 ENCOUNTER — Ambulatory Visit: Payer: BLUE CROSS/BLUE SHIELD | Admitting: Internal Medicine

## 2018-02-02 ENCOUNTER — Ambulatory Visit: Payer: BLUE CROSS/BLUE SHIELD

## 2018-02-02 ENCOUNTER — Other Ambulatory Visit: Payer: BLUE CROSS/BLUE SHIELD

## 2018-02-06 ENCOUNTER — Other Ambulatory Visit: Payer: Self-pay | Admitting: Family Medicine

## 2018-02-06 DIAGNOSIS — K219 Gastro-esophageal reflux disease without esophagitis: Secondary | ICD-10-CM

## 2018-02-06 DIAGNOSIS — R0981 Nasal congestion: Secondary | ICD-10-CM

## 2018-02-13 ENCOUNTER — Other Ambulatory Visit: Payer: Self-pay | Admitting: Family Medicine

## 2018-02-13 DIAGNOSIS — E7800 Pure hypercholesterolemia, unspecified: Secondary | ICD-10-CM

## 2018-03-02 ENCOUNTER — Emergency Department (HOSPITAL_COMMUNITY)
Admission: EM | Admit: 2018-03-02 | Discharge: 2018-03-03 | Disposition: A | Discharge status: Home / Self Care | Payer: BLUE CROSS/BLUE SHIELD | Attending: Emergency Medicine | Admitting: Emergency Medicine

## 2018-03-02 ENCOUNTER — Encounter (HOSPITAL_COMMUNITY): Payer: Self-pay | Admitting: *Deleted

## 2018-03-02 ENCOUNTER — Other Ambulatory Visit: Payer: Self-pay

## 2018-03-02 ENCOUNTER — Emergency Department (HOSPITAL_COMMUNITY): Payer: BLUE CROSS/BLUE SHIELD

## 2018-03-02 DIAGNOSIS — N289 Disorder of kidney and ureter, unspecified: Secondary | ICD-10-CM | POA: Diagnosis not present

## 2018-03-02 DIAGNOSIS — E109 Type 1 diabetes mellitus without complications: Secondary | ICD-10-CM | POA: Diagnosis not present

## 2018-03-02 DIAGNOSIS — D0221 Carcinoma in situ of right bronchus and lung: Secondary | ICD-10-CM | POA: Diagnosis not present

## 2018-03-02 DIAGNOSIS — I10 Essential (primary) hypertension: Secondary | ICD-10-CM | POA: Diagnosis not present

## 2018-03-02 DIAGNOSIS — Z794 Long term (current) use of insulin: Secondary | ICD-10-CM | POA: Diagnosis not present

## 2018-03-02 DIAGNOSIS — Z87891 Personal history of nicotine dependence: Secondary | ICD-10-CM | POA: Diagnosis not present

## 2018-03-02 DIAGNOSIS — Z79899 Other long term (current) drug therapy: Secondary | ICD-10-CM | POA: Insufficient documentation

## 2018-03-02 DIAGNOSIS — K625 Hemorrhage of anus and rectum: Secondary | ICD-10-CM | POA: Insufficient documentation

## 2018-03-02 HISTORY — DX: Acute pancreatitis without necrosis or infection, unspecified: K85.90

## 2018-03-02 HISTORY — DX: Gastro-esophageal reflux disease without esophagitis: K21.9

## 2018-03-02 HISTORY — DX: Hemorrhage of anus and rectum: K62.5

## 2018-03-02 HISTORY — DX: Diverticulosis of intestine, part unspecified, without perforation or abscess without bleeding: K57.90

## 2018-03-02 LAB — TYPE AND SCREEN
ABO/RH(D): B POS
Antibody Screen: NEGATIVE

## 2018-03-02 LAB — CBC WITH DIFFERENTIAL/PLATELET
Basophils Absolute: 0.1 10*3/uL (ref 0.0–0.1)
Basophils Relative: 2 %
Eosinophils Absolute: 0.1 10*3/uL (ref 0.0–0.7)
Eosinophils Relative: 3 %
HCT: 40.2 % (ref 39.0–52.0)
Hemoglobin: 13.7 g/dL (ref 13.0–17.0)
Lymphocytes Relative: 38 %
Lymphs Abs: 1.5 10*3/uL (ref 0.7–4.0)
MCH: 32.3 pg (ref 26.0–34.0)
MCHC: 34.1 g/dL (ref 30.0–36.0)
MCV: 94.8 fL (ref 78.0–100.0)
Monocytes Absolute: 0.2 10*3/uL (ref 0.1–1.0)
Monocytes Relative: 4 %
Neutro Abs: 2.2 10*3/uL (ref 1.7–7.7)
Neutrophils Relative %: 53 %
Platelets: 260 10*3/uL (ref 150–400)
RBC: 4.24 MIL/uL (ref 4.22–5.81)
RDW: 14.4 % (ref 11.5–15.5)
WBC: 4 10*3/uL (ref 4.0–10.5)

## 2018-03-02 LAB — COMPREHENSIVE METABOLIC PANEL
ALT: 46 U/L — ABNORMAL HIGH (ref 0–44)
AST: 86 U/L — ABNORMAL HIGH (ref 15–41)
Albumin: 4 g/dL (ref 3.5–5.0)
Alkaline Phosphatase: 148 U/L — ABNORMAL HIGH (ref 38–126)
Anion gap: 15 (ref 5–15)
BUN: 33 mg/dL — ABNORMAL HIGH (ref 8–23)
CO2: 22 mmol/L (ref 22–32)
Calcium: 8.7 mg/dL — ABNORMAL LOW (ref 8.9–10.3)
Chloride: 105 mmol/L (ref 98–111)
Creatinine, Ser: 1.69 mg/dL — ABNORMAL HIGH (ref 0.61–1.24)
GFR calc Af Amer: 48 mL/min — ABNORMAL LOW (ref >60)
GFR calc non Af Amer: 41 mL/min — ABNORMAL LOW (ref >60)
Glucose, Bld: 87 mg/dL (ref 70–99)
Potassium: 3.9 mmol/L (ref 3.5–5.1)
Sodium: 142 mmol/L (ref 135–145)
Total Bilirubin: 0.8 mg/dL (ref 0.3–1.2)
Total Protein: 9.1 g/dL — ABNORMAL HIGH (ref 6.5–8.1)

## 2018-03-02 LAB — PROTIME-INR
INR: 1.13
Prothrombin Time: 14.4 seconds (ref 11.4–15.2)

## 2018-03-02 LAB — URINALYSIS, ROUTINE W REFLEX MICROSCOPIC
Bilirubin Urine: NEGATIVE
Glucose, UA: NEGATIVE mg/dL
Hgb urine dipstick: NEGATIVE
Ketones, ur: NEGATIVE mg/dL
Leukocytes, UA: NEGATIVE
Nitrite: NEGATIVE
Protein, ur: NEGATIVE mg/dL
Specific Gravity, Urine: 1.012 (ref 1.005–1.030)
pH: 5 (ref 5.0–8.0)

## 2018-03-02 LAB — POC OCCULT BLOOD, ED: Fecal Occult Bld: POSITIVE — AB

## 2018-03-02 MED ORDER — SODIUM CHLORIDE 0.9 % IV BOLUS
500.0000 mL | Freq: Once | INTRAVENOUS | Status: AC
  Administered 2018-03-03: 500 mL via INTRAVENOUS

## 2018-03-02 MED ORDER — SODIUM CHLORIDE 0.9 % IV SOLN
INTRAVENOUS | Status: DC
  Administered 2018-03-02: 22:00:00 via INTRAVENOUS

## 2018-03-02 MED ORDER — IOHEXOL 300 MG/ML  SOLN: 30.0000 mL | Freq: Once | INTRAMUSCULAR | Status: DC | PRN

## 2018-03-02 MED ORDER — SODIUM CHLORIDE 0.9 % IV BOLUS
500.0000 mL | Freq: Once | INTRAVENOUS | Status: AC
  Administered 2018-03-02: 500 mL via INTRAVENOUS

## 2018-03-02 NOTE — ED Triage Notes (Signed)
Pt reports bright red and dark red blood from the rectum on and off for about 3 days, heavier today. Pt denies pain/n/v.
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
## 2018-03-02 NOTE — ED Notes (Signed)
Pt given urinal.

## 2018-03-03 LAB — ABO/RH: ABO/RH(D): B POS

## 2018-03-03 MED ORDER — HYDROCORTISONE ACETATE 25 MG RE SUPP: 25.0000 mg | Freq: Two times a day (BID) | RECTAL | 0 refills | Status: DC

## 2018-03-03 MED ORDER — SODIUM CHLORIDE 0.9 % IV BOLUS
1000.0000 mL | Freq: Once | INTRAVENOUS | Status: AC
  Administered 2018-03-03: 1000 mL via INTRAVENOUS

## 2018-03-03 NOTE — ED Provider Notes (Signed)
Patient was left at change of shift to get results of his CT scan.  Patient states he has a history of hemorrhoid problems.  He states he had a colonoscopy several years ago and had a polyp removed.  He had a follow-up colonoscopy about 2 years ago at [facility] that was normal.  He states for the past 3 days he sometimes sees blood in the toilet and sometimes he can feel some wetness from around his rectum and he has some blood in his underwear.  He states when he wipes there might be a dark spot on the tissue.  He describes the blood is dark red, he states is not bright red not black.  He denies feeling dizziness or lightheadedness.  Patient is being treated by Dr. Earlie Server for lung cancer and states he finished his treatments at the end of July.  Patient is noted to be developing a renal insufficiency since his blood work in July.  He states he does not urinate much during the day but when he lays down at night he urinates a lot.  We discussed he may have a prostate problem.  We discussed his blood work results, he is not anemic, actually he is usually a little bit anemic.  He states he knows he has hemorrhoids.  Patient was sent home with [facility] suppositories and given GI referral to the gastroenterologist on-call.  He prefers to see a gastroenterologist locally rather than going back to [facility].  We discussed returning to the ED if he gets heavy bleeding or starts feeling symptomatic such as lightheadedness or dizziness or weakness.   Results for orders placed or performed during the hospital encounter of 03/02/18  Comprehensive metabolic panel  Result Value Ref Range   Sodium 142 135 - 145 mmol/L   Potassium 3.9 3.5 - 5.1 mmol/L   Chloride 105 98 - 111 mmol/L   CO2 22 22 - 32 mmol/L   Glucose, Bld 87 70 - 99 mg/dL   BUN 33 (H) 8 - 23 mg/dL   Creatinine, Ser 1.69 (H) 0.61 - 1.24 mg/dL   Calcium 8.7 (L) 8.9 - 10.3 mg/dL   Total Protein 9.1 (H) 6.5 - 8.1 g/dL   Albumin 4.0 3.5 - 5.0 g/dL    AST 86 (H) 15 - 41 U/L   ALT 46 (H) 0 - 44 U/L   Alkaline Phosphatase 148 (H) 38 - 126 U/L   Total Bilirubin 0.8 0.3 - 1.2 mg/dL   GFR calc non Af Amer 41 (L) >60 mL/min   GFR calc Af Amer 48 (L) >60 mL/min   Anion gap 15 5 - 15  CBC with Differential  Result Value Ref Range   WBC 4.0 4.0 - 10.5 K/uL   RBC 4.24 4.22 - 5.81 MIL/uL   Hemoglobin 13.7 13.0 - 17.0 g/dL   HCT 40.2 39.0 - 52.0 %   MCV 94.8 78.0 - 100.0 fL   MCH 32.3 26.0 - 34.0 pg   MCHC 34.1 30.0 - 36.0 g/dL   RDW 14.4 11.5 - 15.5 %   Platelets 260 150 - 400 K/uL   Neutrophils Relative % 53 %   Neutro Abs 2.2 1.7 - 7.7 K/uL   Lymphocytes Relative 38 %   Lymphs Abs 1.5 0.7 - 4.0 K/uL   Monocytes Relative 4 %   Monocytes Absolute 0.2 0.1 - 1.0 K/uL   Eosinophils Relative 3 %   Eosinophils Absolute 0.1 0.0 - 0.7 K/uL   Basophils Relative  2 %   Basophils Absolute 0.1 0.0 - 0.1 K/uL  Protime-INR  Result Value Ref Range   Prothrombin Time 14.4 11.4 - 15.2 seconds   INR 1.13   Urinalysis, Routine w reflex microscopic  Result Value Ref Range   Color, Urine YELLOW YELLOW   APPearance CLEAR CLEAR   Specific Gravity, Urine 1.012 1.005 - 1.030   pH 5.0 5.0 - 8.0   Glucose, UA NEGATIVE NEGATIVE mg/dL   Hgb urine dipstick NEGATIVE NEGATIVE   Bilirubin Urine NEGATIVE NEGATIVE   Ketones, ur NEGATIVE NEGATIVE mg/dL   Protein, ur NEGATIVE NEGATIVE mg/dL   Nitrite NEGATIVE NEGATIVE   Leukocytes, UA NEGATIVE NEGATIVE  POC occult blood, ED  Result Value Ref Range   Fecal Occult Bld POSITIVE (A) NEGATIVE  Type and screen [place]  Result Value Ref Range   ABO/RH(D) B POS    Antibody Screen NEG    Sample Expiration      03/05/2018 Performed at [facility] [address], [place], [place] [address]    Ct Abdomen Pelvis Wo Contrast  Result Date: 03/02/2018 CLINICAL DATA:  Rectal bleeding. History of pancreatitis. Lung cancer. EXAM: CT ABDOMEN AND PELVIS WITHOUT CONTRAST  TECHNIQUE: Multidetector CT imaging of the abdomen and pelvis was performed following the standard protocol without IV contrast. COMPARISON:  PET CT 08/23/2016.  Chest CT 01/16/2018 FINDINGS: Lower chest: Postradiation fibrosis again noted in the right middle lobe and right lower lobe, stable since most recent chest CT. No acute confluent airspace opacity or effusion. Heart is normal size. Hepatobiliary: Fatty infiltration of the liver. No focal abnormality or biliary ductal dilatation. Scattered punctate calcifications. Gallbladder unremarkable. Pancreas: No focal abnormality or ductal dilatation. Spleen: No focal abnormality. Normal size. Scattered calcifications. Adrenals/Urinary Tract: Right upper pole renal cyst again noted. Left nephrolithiasis, stable. No hydronephrosis. Urinary bladder and adrenal glands unremarkable. Stomach/Bowel: Descending colonic and sigmoid diverticulosis. No active diverticulitis. Stomach and small bowel decompressed. Vascular/Lymphatic: Aortic atherosclerosis. No enlarged abdominal or pelvic lymph nodes. Reproductive: No visible focal abnormality. Other: No free fluid or free air. Musculoskeletal: Degenerative changes in the lumbar spine. No acute or focal bony abnormality. IMPRESSION: Fatty infiltration of the liver. Old granulomatous disease in the spleen and liver. Left nephrolithiasis.  No hydronephrosis. Left colonic diverticulosis.  No active diverticulitis. Radiation fibrosis in the right middle lobe and right lower lobe. Electronically Signed   By: Rolm Baptise M.D.   On: 03/02/2018 23:55   Diagnoses that have been ruled out:  None  Diagnoses that are still under consideration:  None  Final diagnoses:  Rectal bleeding  Renal insufficiency    ED Discharge Orders         Ordered    hydrocortisone (ANUSOL-HC) 25 MG suppository  2 times daily     03/03/18 0121          Plan discharge  Rolland Porter, MD, Barbette Or, MD 03/03/18 [phone]

## 2018-03-03 NOTE — Discharge Instructions (Signed)
Use the suppositories as prescribed. Call Dr Ulyses Amor office to get an appointment to evaluate your rectal bleeding. He's a gastroenterologist. Return to the ED if you get abdominal pain, heavy rectal bleeding, you get dizziness, light-headedness or weakness.

## 2018-03-07 ENCOUNTER — Other Ambulatory Visit: Payer: Self-pay | Admitting: Family Medicine

## 2018-03-07 DIAGNOSIS — R0981 Nasal congestion: Secondary | ICD-10-CM

## 2018-03-07 DIAGNOSIS — E1169 Type 2 diabetes mellitus with other specified complication: Secondary | ICD-10-CM

## 2018-03-16 ENCOUNTER — Other Ambulatory Visit: Payer: Self-pay | Admitting: Family Medicine

## 2018-03-16 DIAGNOSIS — M1A9XX0 Chronic gout, unspecified, without tophus (tophi): Secondary | ICD-10-CM

## 2018-03-16 DIAGNOSIS — I10 Essential (primary) hypertension: Secondary | ICD-10-CM

## 2018-04-03 ENCOUNTER — Encounter (HOSPITAL_COMMUNITY): Payer: Self-pay | Admitting: Emergency Medicine

## 2018-04-03 ENCOUNTER — Ambulatory Visit (HOSPITAL_COMMUNITY)
Admission: EM | Admit: 2018-04-03 | Discharge: 2018-04-03 | Disposition: A | Discharge status: Home / Self Care | Payer: BLUE CROSS/BLUE SHIELD | Attending: Internal Medicine | Admitting: Internal Medicine

## 2018-04-03 ENCOUNTER — Telehealth: Payer: Self-pay | Admitting: *Deleted

## 2018-04-03 DIAGNOSIS — J209 Acute bronchitis, unspecified: Secondary | ICD-10-CM | POA: Diagnosis not present

## 2018-04-03 DIAGNOSIS — L409 Psoriasis, unspecified: Secondary | ICD-10-CM

## 2018-04-03 MED ORDER — IPRATROPIUM-ALBUTEROL 0.5-2.5 (3) MG/3ML IN SOLN
RESPIRATORY_TRACT | Status: AC
  Filled 2018-04-03: qty 3

## 2018-04-03 MED ORDER — TRIAMCINOLONE ACETONIDE 0.1 % EX CREA: TOPICAL_CREAM | CUTANEOUS | 0 refills | Status: AC

## 2018-04-03 MED ORDER — IPRATROPIUM-ALBUTEROL 0.5-2.5 (3) MG/3ML IN SOLN
3.0000 mL | Freq: Once | RESPIRATORY_TRACT | Status: AC
  Administered 2018-04-03: 3 mL via RESPIRATORY_TRACT

## 2018-04-03 MED ORDER — CLOBETASOL PROPIONATE 0.05 % EX SHAM: MEDICATED_SHAMPOO | CUTANEOUS | 0 refills | Status: DC

## 2018-04-03 MED ORDER — AZITHROMYCIN 250 MG PO TABS: ORAL_TABLET | ORAL | 0 refills | Status: AC

## 2018-04-03 NOTE — Discharge Instructions (Addendum)
1-Do not use the triamcinolone cream on your face, only from neck down for 7-10 days. Use cream moisturizer like Nivea and apply on your skin when out the shower, but leave some water beads on your skin to help it absorb better.  2- Take the Z-pack til finished, I believe you have bronchitis from your cold since your lungs were wheezing.  3- go see your family Dr in 7-10 days for recheck of your lungs and rash.
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
## 2018-04-03 NOTE — ED Triage Notes (Signed)
PT reports a rash that started weeks ago after taking clindamycin. Large circular patches on chest. Rash on scalp, under arms, and face.   PT is a lung cancer patient. He last had radiation 3 months ago.

## 2018-04-03 NOTE — Telephone Encounter (Signed)
Oncology Nurse Navigator Documentation  Oncology Nurse Navigator Flowsheets 04/03/2018  Navigator Location [facility]  Navigator Encounter Type Telephone/I called to check on [name] smoking cessation.  I spoke with him and I listened as he explained he is still smoking.  I asked to meet with him next time he is here at the cancer center to go over tips to help quit.  He was willing to discuss.  He will be seen on 04/20/18.   Telephone Outgoing Call  Barriers/Navigation Needs Education;Coordination of Care  Education Smoking cessation  Interventions Coordination of Care;Education  Coordination of Care Appts  Education Method Verbal  Acuity Level 1  Time Spent with Patient 30

## 2018-04-05 ENCOUNTER — Other Ambulatory Visit: Payer: Self-pay | Admitting: Family Medicine

## 2018-04-05 DIAGNOSIS — E7800 Pure hypercholesterolemia, unspecified: Secondary | ICD-10-CM

## 2018-04-06 ENCOUNTER — Other Ambulatory Visit: Payer: Self-pay | Admitting: Family Medicine

## 2018-04-06 DIAGNOSIS — E7800 Pure hypercholesterolemia, unspecified: Secondary | ICD-10-CM

## 2018-04-07 ENCOUNTER — Telehealth: Payer: Self-pay | Admitting: Internal Medicine

## 2018-04-07 NOTE — Telephone Encounter (Signed)
Scheduled appt per 10/4  sch message - pt is aware of appt date and time   

## 2018-04-13 ENCOUNTER — Encounter (HOSPITAL_COMMUNITY): Payer: Self-pay

## 2018-04-13 ENCOUNTER — Emergency Department (HOSPITAL_COMMUNITY)
Admission: EM | Admit: 2018-04-13 | Discharge: 2018-04-13 | Disposition: A | Discharge status: Home / Self Care | Payer: BLUE CROSS/BLUE SHIELD | Attending: Emergency Medicine | Admitting: Emergency Medicine

## 2018-04-13 DIAGNOSIS — D649 Anemia, unspecified: Secondary | ICD-10-CM | POA: Diagnosis not present

## 2018-04-13 DIAGNOSIS — Z7984 Long term (current) use of oral hypoglycemic drugs: Secondary | ICD-10-CM | POA: Diagnosis not present

## 2018-04-13 DIAGNOSIS — Z79899 Other long term (current) drug therapy: Secondary | ICD-10-CM | POA: Insufficient documentation

## 2018-04-13 DIAGNOSIS — Z87891 Personal history of nicotine dependence: Secondary | ICD-10-CM | POA: Diagnosis not present

## 2018-04-13 DIAGNOSIS — Z85828 Personal history of other malignant neoplasm of skin: Secondary | ICD-10-CM | POA: Diagnosis not present

## 2018-04-13 DIAGNOSIS — Z85118 Personal history of other malignant neoplasm of bronchus and lung: Secondary | ICD-10-CM | POA: Insufficient documentation

## 2018-04-13 DIAGNOSIS — I10 Essential (primary) hypertension: Secondary | ICD-10-CM | POA: Diagnosis not present

## 2018-04-13 DIAGNOSIS — K649 Unspecified hemorrhoids: Secondary | ICD-10-CM | POA: Insufficient documentation

## 2018-04-13 DIAGNOSIS — E119 Type 2 diabetes mellitus without complications: Secondary | ICD-10-CM | POA: Diagnosis not present

## 2018-04-13 DIAGNOSIS — K625 Hemorrhage of anus and rectum: Secondary | ICD-10-CM | POA: Diagnosis present

## 2018-04-13 LAB — COMPREHENSIVE METABOLIC PANEL
ALT: 16 U/L (ref 0–44)
AST: 28 U/L (ref 15–41)
Albumin: 3.4 g/dL — ABNORMAL LOW (ref 3.5–5.0)
Alkaline Phosphatase: 104 U/L (ref 38–126)
Anion gap: 15 (ref 5–15)
BUN: 24 mg/dL — ABNORMAL HIGH (ref 8–23)
CO2: 23 mmol/L (ref 22–32)
Calcium: 8.1 mg/dL — ABNORMAL LOW (ref 8.9–10.3)
Chloride: 105 mmol/L (ref 98–111)
Creatinine, Ser: 1.71 mg/dL — ABNORMAL HIGH (ref 0.61–1.24)
GFR calc Af Amer: 47 mL/min — ABNORMAL LOW (ref >60)
GFR calc non Af Amer: 41 mL/min — ABNORMAL LOW (ref >60)
Glucose, Bld: 79 mg/dL (ref 70–99)
Potassium: 3.5 mmol/L (ref 3.5–5.1)
Sodium: 143 mmol/L (ref 135–145)
Total Bilirubin: 0.4 mg/dL (ref 0.3–1.2)
Total Protein: 7.7 g/dL (ref 6.5–8.1)

## 2018-04-13 LAB — CBC
HCT: 33.5 % — ABNORMAL LOW (ref 39.0–52.0)
Hemoglobin: 10.9 g/dL — ABNORMAL LOW (ref 13.0–17.0)
MCH: 32 pg (ref 26.0–34.0)
MCHC: 32.5 g/dL (ref 30.0–36.0)
MCV: 98.2 fL (ref 80.0–100.0)
Platelets: 190 10*3/uL (ref 150–400)
RBC: 3.41 MIL/uL — ABNORMAL LOW (ref 4.22–5.81)
RDW: 14.7 % (ref 11.5–15.5)
WBC: 4.7 10*3/uL (ref 4.0–10.5)
nRBC: 0 % (ref 0.0–0.2)
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
## 2018-04-13 NOTE — ED Triage Notes (Signed)
Pt complains of rectal bleeding since last night, he states that he's had trouble with hemorroids and he's had two polyps removed and he has more in his colon. Pt describes the blood as bright red

## 2018-04-13 NOTE — Discharge Instructions (Signed)
Watch for worsening lightheadedness or dizziness.  Use stool softeners to help keep her stool soft.  Follow-up with GI and her primary care doctor may need to recheck your hemoglobin.

## 2018-04-18 ENCOUNTER — Inpatient Hospital Stay: Payer: BLUE CROSS/BLUE SHIELD | Attending: Internal Medicine

## 2018-04-18 ENCOUNTER — Inpatient Hospital Stay: Payer: BLUE CROSS/BLUE SHIELD

## 2018-04-18 ENCOUNTER — Ambulatory Visit (HOSPITAL_COMMUNITY)
Admission: RE | Admit: 2018-04-18 | Discharge: 2018-04-18 | Disposition: A | Discharge status: Home / Self Care | Payer: BLUE CROSS/BLUE SHIELD | Source: Ambulatory Visit | Attending: Nurse Practitioner | Admitting: Nurse Practitioner

## 2018-04-18 DIAGNOSIS — C3491 Malignant neoplasm of unspecified part of right bronchus or lung: Secondary | ICD-10-CM

## 2018-04-18 DIAGNOSIS — I10 Essential (primary) hypertension: Secondary | ICD-10-CM | POA: Diagnosis not present

## 2018-04-18 DIAGNOSIS — E119 Type 2 diabetes mellitus without complications: Secondary | ICD-10-CM | POA: Diagnosis not present

## 2018-04-18 DIAGNOSIS — Z79899 Other long term (current) drug therapy: Secondary | ICD-10-CM | POA: Diagnosis not present

## 2018-04-18 DIAGNOSIS — J439 Emphysema, unspecified: Secondary | ICD-10-CM | POA: Diagnosis not present

## 2018-04-18 DIAGNOSIS — C3431 Malignant neoplasm of lower lobe, right bronchus or lung: Secondary | ICD-10-CM | POA: Insufficient documentation

## 2018-04-18 DIAGNOSIS — F17200 Nicotine dependence, unspecified, uncomplicated: Secondary | ICD-10-CM | POA: Insufficient documentation

## 2018-04-18 DIAGNOSIS — C3490 Malignant neoplasm of unspecified part of unspecified bronchus or lung: Secondary | ICD-10-CM | POA: Diagnosis not present

## 2018-04-18 DIAGNOSIS — Z794 Long term (current) use of insulin: Secondary | ICD-10-CM | POA: Diagnosis not present

## 2018-04-18 DIAGNOSIS — C771 Secondary and unspecified malignant neoplasm of intrathoracic lymph nodes: Secondary | ICD-10-CM | POA: Insufficient documentation

## 2018-04-18 DIAGNOSIS — Z5112 Encounter for antineoplastic immunotherapy: Secondary | ICD-10-CM

## 2018-04-18 LAB — CBC WITH DIFFERENTIAL (CANCER CENTER ONLY)
Abs Immature Granulocytes: 0.02 10*3/uL (ref 0.00–0.07)
Basophils Absolute: 0 10*3/uL (ref 0.0–0.1)
Basophils Relative: 1 %
Eosinophils Absolute: 0.2 10*3/uL (ref 0.0–0.5)
Eosinophils Relative: 4 %
HCT: 35.2 % — ABNORMAL LOW (ref 39.0–52.0)
Hemoglobin: 11.7 g/dL — ABNORMAL LOW (ref 13.0–17.0)
Immature Granulocytes: 0 %
Lymphocytes Relative: 15 %
Lymphs Abs: 0.9 10*3/uL (ref 0.7–4.0)
MCH: 32 pg (ref 26.0–34.0)
MCHC: 33.2 g/dL (ref 30.0–36.0)
MCV: 96.2 fL (ref 80.0–100.0)
Monocytes Absolute: 0.4 10*3/uL (ref 0.1–1.0)
Monocytes Relative: 6 %
Neutro Abs: 4.1 10*3/uL (ref 1.7–7.7)
Neutrophils Relative %: 74 %
Platelet Count: 166 10*3/uL (ref 150–400)
RBC: 3.66 MIL/uL — ABNORMAL LOW (ref 4.22–5.81)
RDW: 14.6 % (ref 11.5–15.5)
WBC Count: 5.6 10*3/uL (ref 4.0–10.5)
nRBC: 0 % (ref 0.0–0.2)

## 2018-04-18 LAB — CMP (CANCER CENTER ONLY)
ALT: 19 U/L (ref 0–44)
AST: 41 U/L (ref 15–41)
Albumin: 3.6 g/dL (ref 3.5–5.0)
Alkaline Phosphatase: 171 U/L — ABNORMAL HIGH (ref 38–126)
Anion gap: 13 (ref 5–15)
BUN: 10 mg/dL (ref 8–23)
CO2: 26 mmol/L (ref 22–32)
Calcium: 8.7 mg/dL — ABNORMAL LOW (ref 8.9–10.3)
Chloride: 103 mmol/L (ref 98–111)
Creatinine: 1.34 mg/dL — ABNORMAL HIGH (ref 0.61–1.24)
GFR, Est AFR Am: >60 mL/min (ref >60)
GFR, Estimated: 55 mL/min — ABNORMAL LOW (ref >60)
Glucose, Bld: 94 mg/dL (ref 70–99)
Potassium: 3.4 mmol/L — ABNORMAL LOW (ref 3.5–5.1)
Sodium: 142 mmol/L (ref 135–145)
Total Bilirubin: 0.9 mg/dL (ref 0.3–1.2)
Total Protein: 8.4 g/dL — ABNORMAL HIGH (ref 6.5–8.1)

## 2018-04-18 LAB — TSH: TSH: 4.323 u[IU]/mL — ABNORMAL HIGH (ref 0.320–4.118)

## 2018-04-18 MED ORDER — SODIUM CHLORIDE 0.9 % IJ SOLN
INTRAMUSCULAR | Status: AC
  Filled 2018-04-18: qty 50

## 2018-04-18 MED ORDER — IOHEXOL 300 MG/ML  SOLN
75.0000 mL | Freq: Once | INTRAMUSCULAR | Status: AC | PRN
  Administered 2018-04-18: 75 mL via INTRAVENOUS

## 2018-04-20 ENCOUNTER — Inpatient Hospital Stay (HOSPITAL_BASED_OUTPATIENT_CLINIC_OR_DEPARTMENT_OTHER): Payer: BLUE CROSS/BLUE SHIELD | Admitting: Internal Medicine

## 2018-04-20 ENCOUNTER — Other Ambulatory Visit: Payer: BLUE CROSS/BLUE SHIELD

## 2018-04-20 ENCOUNTER — Encounter: Payer: Self-pay | Admitting: Internal Medicine

## 2018-04-20 ENCOUNTER — Telehealth: Payer: Self-pay | Admitting: Medical Oncology

## 2018-04-20 ENCOUNTER — Telehealth: Payer: Self-pay | Admitting: Internal Medicine

## 2018-04-20 ENCOUNTER — Inpatient Hospital Stay: Payer: BLUE CROSS/BLUE SHIELD | Admitting: Internal Medicine

## 2018-04-20 VITALS — HR 94 | Temp 98.1°F | Resp 18 | Ht 74.0 in | Wt 214.1 lb

## 2018-04-20 DIAGNOSIS — C3491 Malignant neoplasm of unspecified part of right bronchus or lung: Secondary | ICD-10-CM

## 2018-04-20 DIAGNOSIS — C3431 Malignant neoplasm of lower lobe, right bronchus or lung: Secondary | ICD-10-CM

## 2018-04-20 DIAGNOSIS — C771 Secondary and unspecified malignant neoplasm of intrathoracic lymph nodes: Secondary | ICD-10-CM

## 2018-04-20 DIAGNOSIS — C3490 Malignant neoplasm of unspecified part of unspecified bronchus or lung: Secondary | ICD-10-CM

## 2018-04-20 DIAGNOSIS — I10 Essential (primary) hypertension: Secondary | ICD-10-CM

## 2018-04-20 DIAGNOSIS — F17200 Nicotine dependence, unspecified, uncomplicated: Secondary | ICD-10-CM

## 2018-04-20 DIAGNOSIS — Z79899 Other long term (current) drug therapy: Secondary | ICD-10-CM

## 2018-04-20 DIAGNOSIS — Z5112 Encounter for antineoplastic immunotherapy: Secondary | ICD-10-CM

## 2018-04-20 DIAGNOSIS — E119 Type 2 diabetes mellitus without complications: Secondary | ICD-10-CM

## 2018-04-20 MED ORDER — VARENICLINE TARTRATE 1 MG PO TABS: 1.0000 mg | ORAL_TABLET | Freq: Two times a day (BID) | ORAL | 1 refills | Status: DC

## 2018-04-20 NOTE — Patient Instructions (Signed)
   S:     Brand smoked "filtered" . Number of cigarettes/day 0-1.    Denies waking to smoke   Fagerstrom Score Question Scoring Patient Score  How soon after waking do you smoke your first cigarette? <5 mins (3) 5-30 mins (2) 31-60 mins (1) >60 mins (0) 0  Do you find it difficult NOT to smoke in places where you shouldn't? Yes(1) No (0) 0  Which cigarette would you most hate to give up? First one in AM (1)  Any other one (0)  0    How many cigarettes do you smoke/day? 10 or less (0) 11-20 (1) 21-30 (2) >30 (3) 0  Do you smoke more during the first few hours after waking? Yes (1) No (0) 0  Do you smoke if you are so ill you cannot get out of bed? Yes (1) No (0) 0   Total Score   Score interpretation: low 1-2, low-to-moderate 3-4, moderate 5-7, high >7  Most recent quit attempt months ago Longest time ever been tobacco free a few weeks  Medications used in past cessation efforts include:  varenicline and nicotine gum  Rates IMPORTANCE of quitting tobacco on 1-10 scale of 10 Rates CONFIDENCE of quitting tobacco on 1-10 scale of 8  Most common triggers to use tobacco include; being around others that smoke  Motivation to quit: better health    A/P: Tobacco use disorder with  MILD nicotine dependence of "many" years duration in a patient who is good candidate for success because of willingness.     -Initiated nicotine replacement tx with nicotine gum.  He is also using varenicline.  Patient counseled on purpose, proper use, and potential adverse effects.     -Provided information on 1 800-QUIT NOW support program.   Written information provided.  F/U phone call [phone]

## 2018-04-20 NOTE — Telephone Encounter (Signed)
Scheduled appt per 10/17 los - sent reminder letter in the mail - lab and f.u in 3 months.

## 2018-04-20 NOTE — Telephone Encounter (Signed)
Called in chantix
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
## 2018-04-26 ENCOUNTER — Ambulatory Visit: Payer: BLUE CROSS/BLUE SHIELD | Attending: Family Medicine | Admitting: Family Medicine

## 2018-04-26 ENCOUNTER — Encounter: Payer: Self-pay | Admitting: Family Medicine

## 2018-04-26 VITALS — BP 125/78 | HR 97 | Temp 97.7°F | Ht 74.0 in | Wt 215.0 lb

## 2018-04-26 DIAGNOSIS — M1A9XX0 Chronic gout, unspecified, without tophus (tophi): Secondary | ICD-10-CM | POA: Insufficient documentation

## 2018-04-26 DIAGNOSIS — Z794 Long term (current) use of insulin: Secondary | ICD-10-CM | POA: Insufficient documentation

## 2018-04-26 DIAGNOSIS — E1149 Type 2 diabetes mellitus with other diabetic neurological complication: Secondary | ICD-10-CM | POA: Insufficient documentation

## 2018-04-26 DIAGNOSIS — M1990 Unspecified osteoarthritis, unspecified site: Secondary | ICD-10-CM | POA: Insufficient documentation

## 2018-04-26 DIAGNOSIS — L409 Psoriasis, unspecified: Secondary | ICD-10-CM | POA: Insufficient documentation

## 2018-04-26 DIAGNOSIS — K219 Gastro-esophageal reflux disease without esophagitis: Secondary | ICD-10-CM | POA: Insufficient documentation

## 2018-04-26 DIAGNOSIS — Z923 Personal history of irradiation: Secondary | ICD-10-CM | POA: Insufficient documentation

## 2018-04-26 DIAGNOSIS — I10 Essential (primary) hypertension: Secondary | ICD-10-CM | POA: Diagnosis not present

## 2018-04-26 DIAGNOSIS — Z79899 Other long term (current) drug therapy: Secondary | ICD-10-CM | POA: Insufficient documentation

## 2018-04-26 DIAGNOSIS — C3491 Malignant neoplasm of unspecified part of right bronchus or lung: Secondary | ICD-10-CM | POA: Insufficient documentation

## 2018-04-26 DIAGNOSIS — Z7951 Long term (current) use of inhaled steroids: Secondary | ICD-10-CM | POA: Insufficient documentation

## 2018-04-26 DIAGNOSIS — E1169 Type 2 diabetes mellitus with other specified complication: Secondary | ICD-10-CM | POA: Diagnosis not present

## 2018-04-26 DIAGNOSIS — Z9221 Personal history of antineoplastic chemotherapy: Secondary | ICD-10-CM | POA: Insufficient documentation

## 2018-04-26 DIAGNOSIS — R0981 Nasal congestion: Secondary | ICD-10-CM | POA: Insufficient documentation

## 2018-04-26 DIAGNOSIS — E7800 Pure hypercholesterolemia, unspecified: Secondary | ICD-10-CM | POA: Insufficient documentation

## 2018-04-26 LAB — POCT GLYCOSYLATED HEMOGLOBIN (HGB A1C): HbA1c, POC (controlled diabetic range): 4.7 % (ref 0.0–7.0)

## 2018-04-26 LAB — GLUCOSE, POCT (MANUAL RESULT ENTRY): POC Glucose: 67 mg/dl — AB (ref 70–99)

## 2018-04-26 MED ORDER — GABAPENTIN 300 MG PO CAPS: 300.0000 mg | ORAL_CAPSULE | Freq: Two times a day (BID) | ORAL | 1 refills | Status: DC

## 2018-04-26 MED ORDER — TRIAMCINOLONE ACETONIDE 0.1 % EX CREA: 1.0000 "application " | TOPICAL_CREAM | Freq: Two times a day (BID) | CUTANEOUS | 1 refills | Status: DC

## 2018-04-26 MED ORDER — AMLODIPINE BESYLATE 10 MG PO TABS: 10.0000 mg | ORAL_TABLET | Freq: Every day | ORAL | 1 refills | Status: DC

## 2018-04-26 MED ORDER — COLCRYS 0.6 MG PO TABS: ORAL_TABLET | ORAL | 1 refills | Status: DC

## 2018-04-26 MED ORDER — FLUTICASONE PROPIONATE 50 MCG/ACT NA SUSP: 1.0000 | Freq: Every day | NASAL | 1 refills | Status: DC

## 2018-04-26 MED ORDER — ALLOPURINOL 300 MG PO TABS: 300.0000 mg | ORAL_TABLET | Freq: Every day | ORAL | 1 refills | Status: DC

## 2018-04-26 MED ORDER — CLOBETASOL PROPIONATE 0.05 % EX SHAM: MEDICATED_SHAMPOO | CUTANEOUS | 0 refills | Status: AC

## 2018-04-26 MED ORDER — ATORVASTATIN CALCIUM 20 MG PO TABS: ORAL_TABLET | ORAL | 1 refills | Status: DC

## 2018-04-26 MED ORDER — SILDENAFIL CITRATE 50 MG PO TABS: 50.0000 mg | ORAL_TABLET | Freq: Every day | ORAL | 0 refills | Status: DC | PRN

## 2018-04-26 NOTE — Patient Instructions (Signed)
Psoriasis  Psoriasis is a long-term (chronic) skin condition. It causes raised, red patches (plaques) on your skin that look silvery. The red patches may show up anywhere on your body. They can be any size or shape.  Psoriasis can come and go. It can range from mild to very bad. It cannot be passed from one person to another (not contagious). There is no cure for this condition, but it can be helped with treatment.  Follow these instructions at home:  Skin Care   Apply moisturizers to your skin as needed. Only use those that your doctor has said are okay.   Apply cool compresses to the affected areas.   Do not scratch your skin.  Lifestyle     Do not use tobacco products. This includes cigarettes, chewing tobacco, and e-cigarettes. If you need help quitting, ask your doctor.   Drink little or no alcohol.   Try to lower your stress. Meditation or yoga may help.   Get sun as told by your doctor. Do not get sunburned.   Think about joining a psoriasis support group.  Medicines   Take or use over-the-counter and prescription medicines only as told by your doctor.   If you were prescribed an antibiotic, take or use it as told by your doctor. Do not stop taking the antibiotic even if your condition starts to get better.  General instructions   Keep a journal. Use this to help track what triggers an outbreak. Try to avoid any triggers.   See a counselor or social worker if you feel very sad, upset, or hopeless about your condition and these feelings affect your work or relationships.   Keep all follow-up visits as told by your doctor. This is important.  Contact a doctor if:   Your pain gets worse.   You have more redness or warmth in the affected areas.   You have new pain or stiffness in your joints.   Your pain or stiffness in your joints gets worse.   Your nails start to break easily.   Your nails pull away from the nail bed easily.   You have a fever.   You feel very sad (depressed).  This  information is not intended to replace advice given to you by your health care provider. Make sure you discuss any questions you have with your health care provider.  Document Released: 07/29/2004 Document Revised: 11/27/2015 Document Reviewed: 11/06/2014  [employer]vier Interactive Patient Education  2018 [employer]
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
## 2018-04-27 ENCOUNTER — Other Ambulatory Visit: Payer: Self-pay | Admitting: Family Medicine

## 2018-04-27 DIAGNOSIS — E7800 Pure hypercholesterolemia, unspecified: Secondary | ICD-10-CM

## 2018-04-27 LAB — MICROALBUMIN / CREATININE URINE RATIO
Creatinine, Urine: 158.6 mg/dL
Microalb/Creat Ratio: 63.4 mg/g creat — ABNORMAL HIGH (ref 0.0–30.0)
Microalbumin, Urine: 100.6 ug/mL

## 2018-04-27 LAB — LIPID PANEL
Chol/HDL Ratio: 3.8 ratio (ref 0.0–5.0)
Cholesterol, Total: 151 mg/dL (ref 100–199)
HDL: 40 mg/dL (ref >39)
Triglycerides: 780 mg/dL (ref 0–149)

## 2018-04-27 MED ORDER — ATORVASTATIN CALCIUM 40 MG PO TABS: 40.0000 mg | ORAL_TABLET | Freq: Every day | ORAL | 6 refills | Status: DC

## 2018-04-28 ENCOUNTER — Encounter: Payer: Self-pay | Admitting: Family Medicine

## 2018-05-01 ENCOUNTER — Telehealth: Payer: Self-pay

## 2018-05-01 MED ORDER — COLCHICINE 0.6 MG PO CAPS: ORAL_CAPSULE | ORAL | 0 refills | Status: DC

## 2018-05-01 NOTE — Telephone Encounter (Signed)
I received a PA for this patients Colc[employer], the insurance prefers brand name Mitigare capsules, please send new RX for the preferred product if acceptable.

## 2018-05-02 ENCOUNTER — Telehealth: Payer: Self-pay

## 2018-05-02 NOTE — Telephone Encounter (Signed)
-----   Message from [name], MD sent at 04/28/2018  7:09 AM EDT ----- He has some microalbuminuria which is from his Diabetes but is allergic to ACEI and so we cannot place him on it. His Diabetes is controlled so that is great.

## 2018-05-02 NOTE — Telephone Encounter (Signed)
Patient was called and informed to contact office for lab results.

## 2018-05-03 ENCOUNTER — Telehealth: Payer: Self-pay | Admitting: Nurse Practitioner

## 2018-05-03 ENCOUNTER — Telehealth: Payer: Self-pay

## 2018-05-03 NOTE — Telephone Encounter (Signed)
Patient returned phone call and was informed of lab results.

## 2018-05-03 NOTE — Telephone Encounter (Signed)
-----   Message from [name], MD sent at 04/28/2018  7:09 AM EDT ----- He has some microalbuminuria which is from his Diabetes but is allergic to ACEI and so we cannot place him on it. His Diabetes is controlled so that is great.

## 2018-05-03 NOTE — Telephone Encounter (Signed)
Patient was called and a voicemail was left informing patient to return phone call for lab results.    If patient returns phone call please inform patient of lab results below.Total cholesterol is normal but triglycerides are severely elevated. Please advised to avoid high cholesterol foods and I have raised his atorvastatin to 40 mg.

## 2018-05-03 NOTE — Telephone Encounter (Signed)
Patient returned nurse call regarding results. Please f/u

## 2018-05-04 IMAGING — MR MR HEAD WO/W CM
11 of 14 series · 34 of 48 positions shown · IV contrast (multihance)
Comparison: CT head 11/18/2015

CLINICAL DATA: Lung cancer, initial staging

EXAM:
MRI HEAD WITHOUT AND WITH CONTRAST
TECHNIQUE: Multiplanar, multiecho pulse sequences of the brain and surrounding
structures were obtained without and with intravenous contrast.
CONTRAST:  20mL MULTIHANCE GADOBENATE DIMEGLUMINE 529 MG/ML IV SOLN

[Series 3: T1 · sagittal · 5.0mm · 0.47mm/px · 1 of 24 slices shown]
[im 1/24]
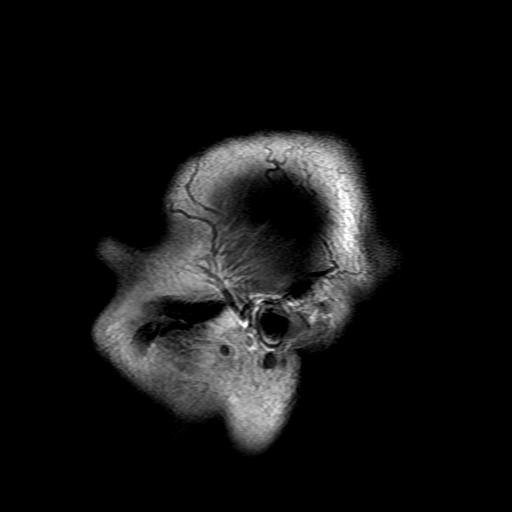

[Series 4: DWI · axial · 3.0mm · 1.09mm/px · z∈[-53,+103]mm · 8 of 106 slices shown (1 of 4)]
[im 1/106]
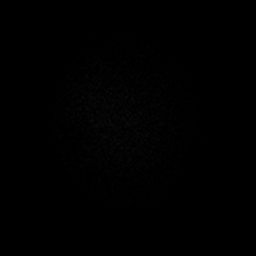
[im 16/106]
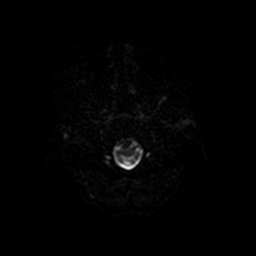
[im 31/106]
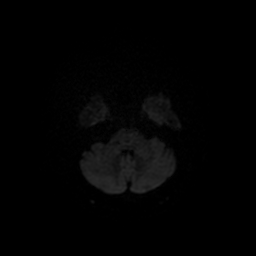
[im 46/106]
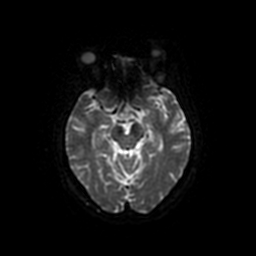
[im 61/106]
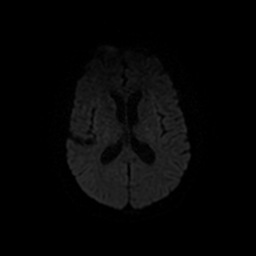
[im 76/106]
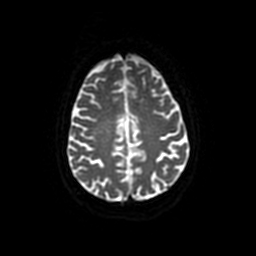
[im 91/106]
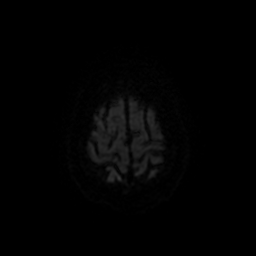
[im 106/106]
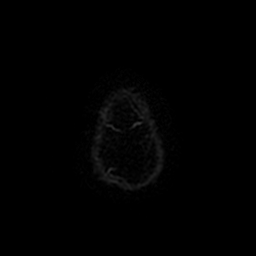

[Series 5: DWI · coronal · 5.0mm · 1.09mm/px · 6 of 72 slices shown (2 of 4)]
[im 1/72]
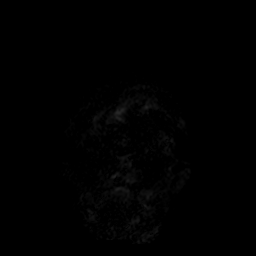
[im 15/72]
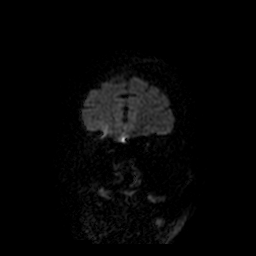
[im 29/72]
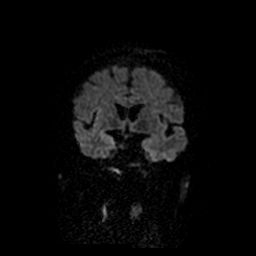
[im 43/72]
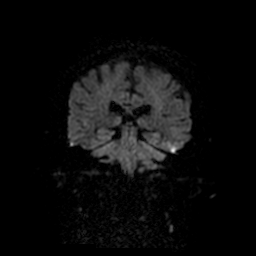
[im 57/72]
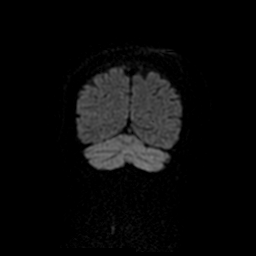
[im 72/72]
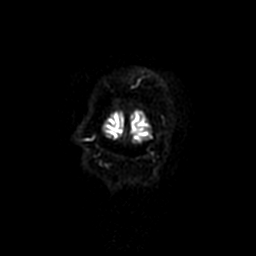

[Series 6: T2 · axial · 5.0mm · 0.43mm/px · z∈[-58,+104]mm · 2 of 26 slices shown (1 of 2)]
[im 1/26]
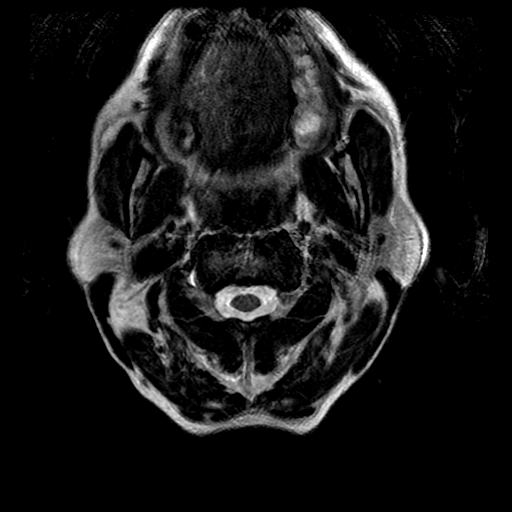
[im 26/26]
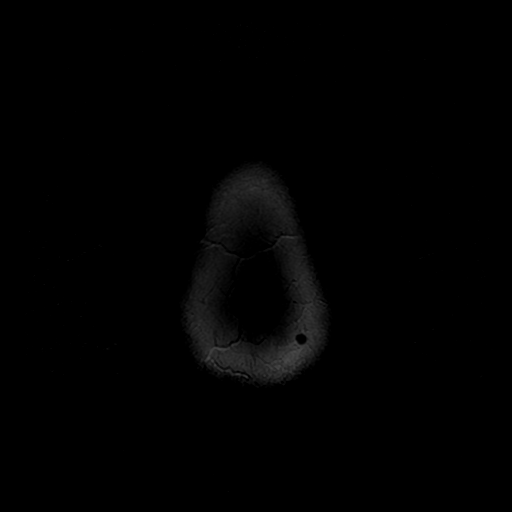

[Series 7: FLAIR · axial · 5.0mm · 0.43mm/px · z∈[-66,+106]mm · 2 of 30 slices shown]
[im 1/30]
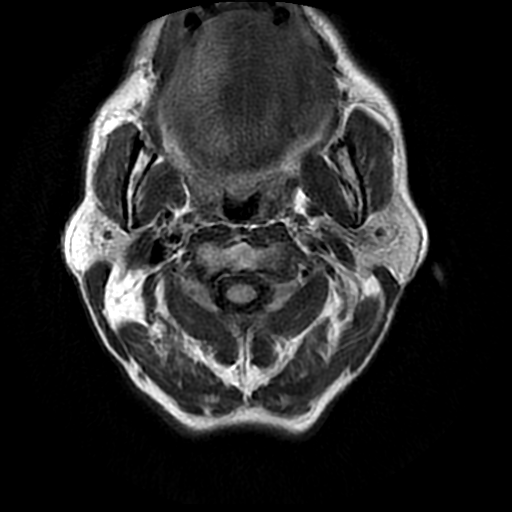
[im 30/30]
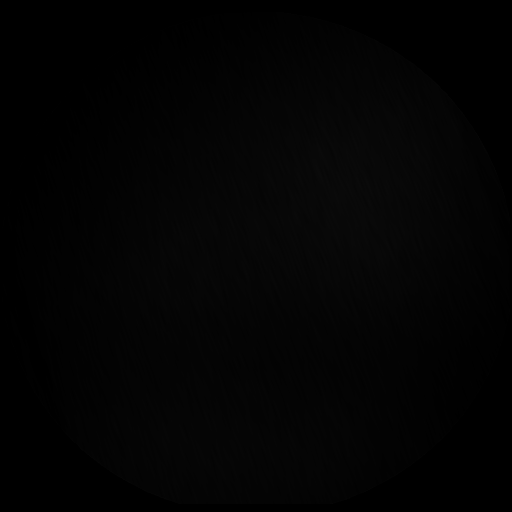

[Series 8: T2 · axial · 5.0mm · 0.43mm/px · z∈[-66,+106]mm · 2 of 30 slices shown (2 of 2)]
[im 1/30]
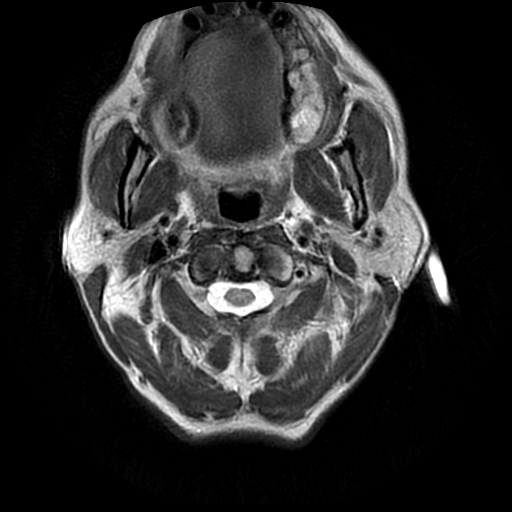
[im 30/30]
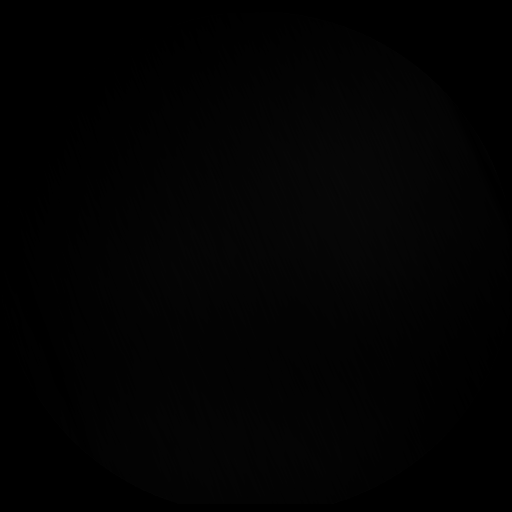

[Series 11: T2 post-contrast · coronal · 5.0mm · 0.45mm/px · 2 of 29 slices shown]
[im 1/29]
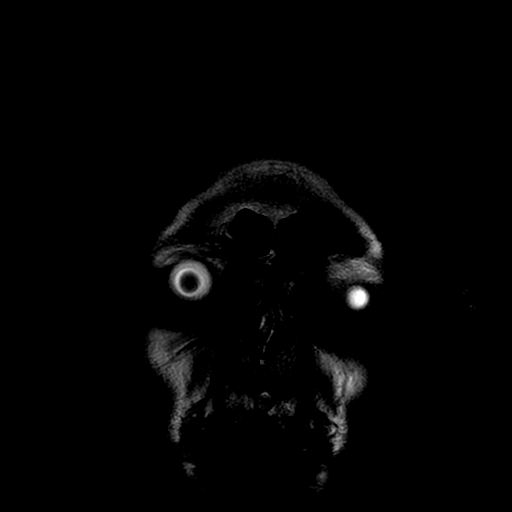
[im 29/29]
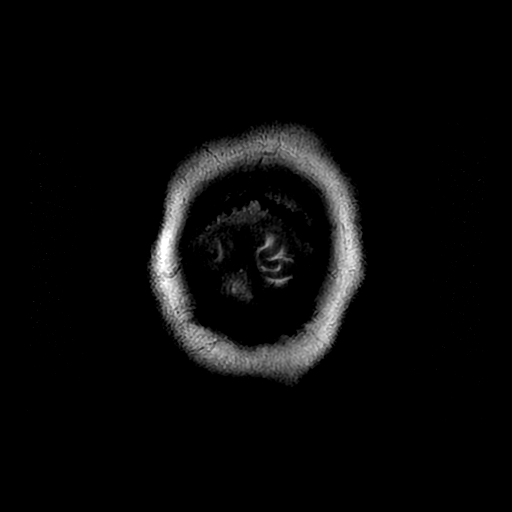

[Series 13: T1 post-contrast · coronal · 5.0mm · 0.45mm/px · 2 of 29 slices shown (1 of 2)]
[im 1/29]
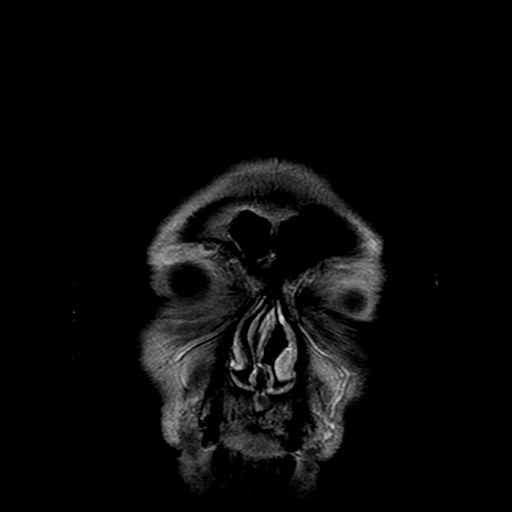
[im 29/29]
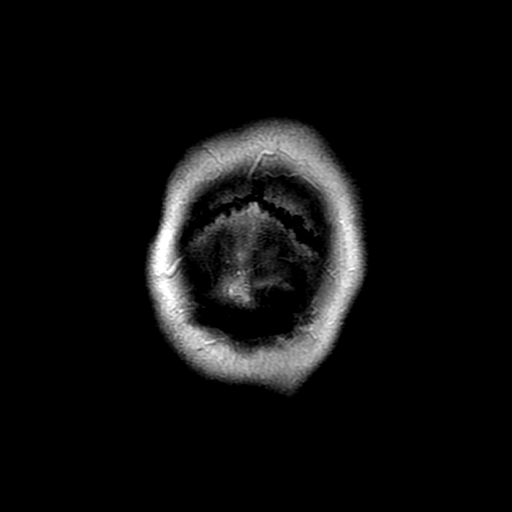

[Series 14: T1 post-contrast · sagittal · 5.0mm · 0.47mm/px · 2 of 24 slices shown (2 of 2)]
[im 1/24]
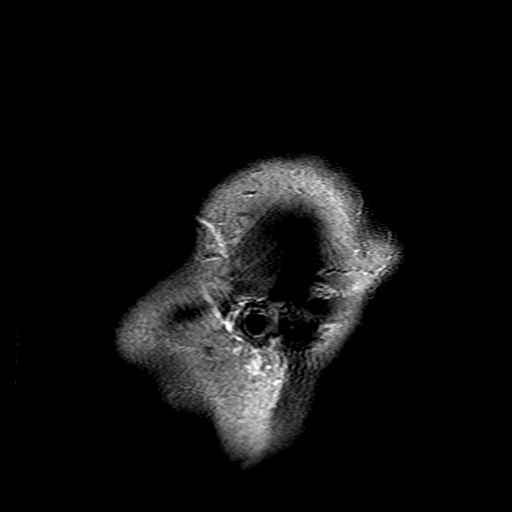
[im 24/24]
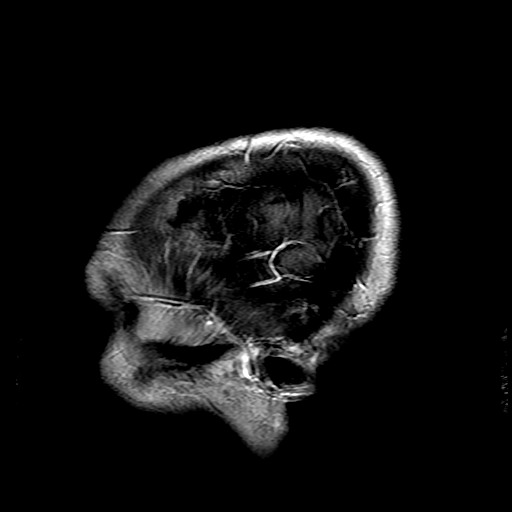

[Series 400: DWI · axial · 3.0mm · 1.09mm/px · z∈[-53,+103]mm · 4 of 53 slices shown (3 of 4)]
[im 1/53]
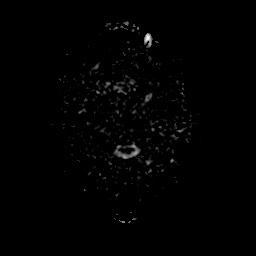
[im 18/53]
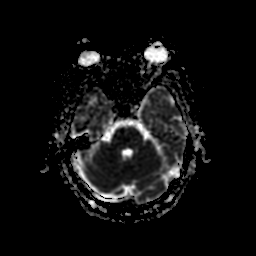
[im 35/53]
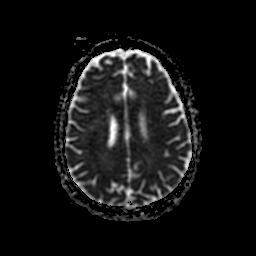
[im 53/53]
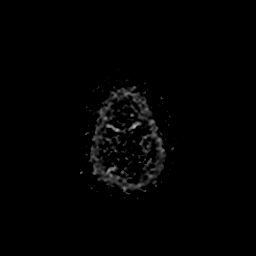

[Series 500: DWI · coronal · 5.0mm · 1.09mm/px · 3 of 36 slices shown (4 of 4)]
[im 1/36]
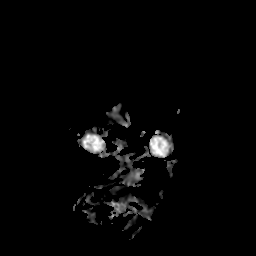
[im 18/36]
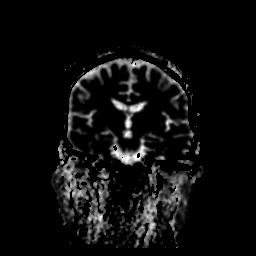
[im 36/36]
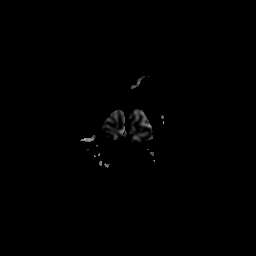

[34 of 48 positions shown; findings below may reference images not displayed]

FINDINGS: Brain: Ventricle size normal. Cerebral volume normal. Negative for
acute infarct. Scattered small white matter hyperintensities
bilaterally appear chronic. Negative for hemorrhage or mass. No
edema or shift of the midline structures. Diffusion-weighted imaging
negative.

Normal enhancement following contrast infusion. No enhancing mass
lesion. Leptomeningeal enhancement normal. Pituitary normal in size.

Vascular: Normal arterial flow voids. Normal venous enhancement.
Hypoplastic left transverse sinus.

Skull and upper cervical spine: Negative

Sinuses/Orbits: Mild mucosal edema in the paranasal sinuses. Normal
orbital structures.

Other: None
IMPRESSION: No acute abnormality.  Negative for metastatic disease.

## 2018-06-06 ENCOUNTER — Other Ambulatory Visit: Payer: Self-pay | Admitting: Internal Medicine

## 2018-06-06 ENCOUNTER — Other Ambulatory Visit: Payer: Self-pay | Admitting: Family Medicine

## 2018-06-06 DIAGNOSIS — E1169 Type 2 diabetes mellitus with other specified complication: Secondary | ICD-10-CM

## 2018-06-06 DIAGNOSIS — K219 Gastro-esophageal reflux disease without esophagitis: Secondary | ICD-10-CM

## 2018-06-18 ENCOUNTER — Other Ambulatory Visit: Payer: Self-pay | Admitting: Family Medicine

## 2018-06-18 DIAGNOSIS — R0981 Nasal congestion: Secondary | ICD-10-CM

## 2018-06-21 ENCOUNTER — Other Ambulatory Visit: Payer: Self-pay | Admitting: Gastroenterology

## 2018-06-25 ENCOUNTER — Emergency Department (HOSPITAL_COMMUNITY): Payer: BLUE CROSS/BLUE SHIELD

## 2018-06-25 ENCOUNTER — Other Ambulatory Visit: Payer: Self-pay

## 2018-06-25 ENCOUNTER — Encounter (HOSPITAL_COMMUNITY): Payer: Self-pay | Admitting: Emergency Medicine

## 2018-06-25 ENCOUNTER — Emergency Department (HOSPITAL_COMMUNITY)
Admission: EM | Admit: 2018-06-25 | Discharge: 2018-06-26 | Disposition: A | Discharge status: Home / Self Care | Payer: BLUE CROSS/BLUE SHIELD | Attending: Emergency Medicine | Admitting: Emergency Medicine

## 2018-06-25 DIAGNOSIS — Z87891 Personal history of nicotine dependence: Secondary | ICD-10-CM | POA: Diagnosis not present

## 2018-06-25 DIAGNOSIS — E1140 Type 2 diabetes mellitus with diabetic neuropathy, unspecified: Secondary | ICD-10-CM | POA: Diagnosis not present

## 2018-06-25 DIAGNOSIS — E162 Hypoglycemia, unspecified: Secondary | ICD-10-CM | POA: Diagnosis not present

## 2018-06-25 DIAGNOSIS — F10920 Alcohol use, unspecified with intoxication, uncomplicated: Secondary | ICD-10-CM | POA: Diagnosis not present

## 2018-06-25 DIAGNOSIS — Z79899 Other long term (current) drug therapy: Secondary | ICD-10-CM | POA: Diagnosis not present

## 2018-06-25 DIAGNOSIS — I10 Essential (primary) hypertension: Secondary | ICD-10-CM | POA: Insufficient documentation

## 2018-06-25 DIAGNOSIS — R0600 Dyspnea, unspecified: Secondary | ICD-10-CM | POA: Diagnosis not present

## 2018-06-25 DIAGNOSIS — F10929 Alcohol use, unspecified with intoxication, unspecified: Secondary | ICD-10-CM | POA: Diagnosis present

## 2018-06-25 DIAGNOSIS — Z794 Long term (current) use of insulin: Secondary | ICD-10-CM | POA: Insufficient documentation

## 2018-06-25 LAB — GLUCOSE, CAPILLARY
Glucose-Capillary: 112 mg/dL — ABNORMAL HIGH (ref 70–99)
Glucose-Capillary: 70 mg/dL (ref 70–99)
Glucose-Capillary: 73 mg/dL (ref 70–99)
Glucose-Capillary: 97 mg/dL (ref 70–99)

## 2018-06-25 LAB — BASIC METABOLIC PANEL
Anion gap: 24 — ABNORMAL HIGH (ref 5–15)
BUN: 31 mg/dL — ABNORMAL HIGH (ref 8–23)
CO2: 13 mmol/L — ABNORMAL LOW (ref 22–32)
Calcium: 8.5 mg/dL — ABNORMAL LOW (ref 8.9–10.3)
Chloride: 100 mmol/L (ref 98–111)
Creatinine, Ser: 1.74 mg/dL — ABNORMAL HIGH (ref 0.61–1.24)
GFR calc Af Amer: 47 mL/min — ABNORMAL LOW (ref >60)
GFR calc non Af Amer: 41 mL/min — ABNORMAL LOW (ref >60)
Glucose, Bld: 67 mg/dL — ABNORMAL LOW (ref 70–99)
Potassium: 3.4 mmol/L — ABNORMAL LOW (ref 3.5–5.1)
Sodium: 137 mmol/L (ref 135–145)

## 2018-06-25 LAB — CBG MONITORING, ED: Glucose-Capillary: 64 mg/dL — ABNORMAL LOW (ref 70–99)

## 2018-06-25 LAB — CBC
HCT: 37.5 % — ABNORMAL LOW (ref 39.0–52.0)
Hemoglobin: 12.2 g/dL — ABNORMAL LOW (ref 13.0–17.0)
MCH: 32.4 pg (ref 26.0–34.0)
MCHC: 32.5 g/dL (ref 30.0–36.0)
MCV: 99.7 fL (ref 80.0–100.0)
Platelets: 266 10*3/uL (ref 150–400)
RBC: 3.76 MIL/uL — ABNORMAL LOW (ref 4.22–5.81)
RDW: 15.9 % — ABNORMAL HIGH (ref 11.5–15.5)
WBC: 7.6 10*3/uL (ref 4.0–10.5)
nRBC: 0 % (ref 0.0–0.2)

## 2018-06-25 LAB — ETHANOL: Alcohol, Ethyl (B): 300 mg/dL — ABNORMAL HIGH (ref <10)

## 2018-06-25 LAB — POCT I-STAT TROPONIN I: Troponin i, poc: 0.02 ng/mL (ref 0.00–0.08)

## 2018-06-25 LAB — D-DIMER, QUANTITATIVE: D-Dimer, Quant: 1.63 ug/mL-FEU — ABNORMAL HIGH (ref 0.00–0.50)

## 2018-06-25 MED ORDER — SODIUM CHLORIDE 0.9 % IV BOLUS
1000.0000 mL | Freq: Once | INTRAVENOUS | Status: AC
  Administered 2018-06-25: 1000 mL via INTRAVENOUS

## 2018-06-25 MED ORDER — PANTOPRAZOLE SODIUM 40 MG PO TBEC
40.0000 mg | DELAYED_RELEASE_TABLET | Freq: Once | ORAL | Status: AC
  Administered 2018-06-25: 40 mg via ORAL
  Filled 2018-06-25: qty 1
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
## 2018-06-25 NOTE — ED Notes (Signed)
Pt. CBG 97, RRN,Merle made aware.

## 2018-06-25 NOTE — ED Notes (Signed)
Pt. CBG 112, RN,Merle made aware.

## 2018-06-25 NOTE — ED Notes (Signed)
Pt. CBG 73,RN,Merle made aware.

## 2018-06-25 NOTE — ED Triage Notes (Signed)
Patient is complaining of his sugar low. Patient has been feeling bad. Patient has not ate much today.

## 2018-06-25 NOTE — ED Notes (Signed)
Jenel Lucks, RN (Triage) aware of CBG of 64

## 2018-06-26 ENCOUNTER — Emergency Department (HOSPITAL_COMMUNITY): Payer: BLUE CROSS/BLUE SHIELD

## 2018-06-26 MED ORDER — IOPAMIDOL (ISOVUE-370) INJECTION 76%
INTRAVENOUS | Status: AC
  Filled 2018-06-26: qty 100

## 2018-06-26 MED ORDER — SODIUM CHLORIDE (PF) 0.9 % IJ SOLN
INTRAMUSCULAR | Status: AC
  Filled 2018-06-26: qty 50

## 2018-06-26 MED ORDER — IOPAMIDOL (ISOVUE-370) INJECTION 76%
100.0000 mL | Freq: Once | INTRAVENOUS | Status: AC | PRN
  Administered 2018-06-26: 100 mL via INTRAVENOUS

## 2018-07-12 ENCOUNTER — Telehealth: Payer: Self-pay | Admitting: Internal Medicine

## 2018-07-12 NOTE — Telephone Encounter (Signed)
Scheduled apt per 1/7 sch message - left message for patient with appt date and time

## 2018-07-17 ENCOUNTER — Other Ambulatory Visit: Payer: Self-pay | Admitting: Family Medicine

## 2018-07-17 ENCOUNTER — Encounter (HOSPITAL_COMMUNITY): Payer: Self-pay | Admitting: *Deleted

## 2018-07-17 DIAGNOSIS — E1169 Type 2 diabetes mellitus with other specified complication: Secondary | ICD-10-CM

## 2018-07-18 ENCOUNTER — Ambulatory Visit (HOSPITAL_COMMUNITY)
Admission: RE | Admit: 2018-07-18 | Discharge: 2018-07-18 | Disposition: A | Discharge status: Home / Self Care | Payer: BLUE CROSS/BLUE SHIELD | Attending: Gastroenterology | Admitting: Gastroenterology

## 2018-07-18 ENCOUNTER — Encounter (HOSPITAL_COMMUNITY)
Admission: RE | Disposition: A | Discharge status: Home / Self Care | Payer: Self-pay | Source: Home / Self Care | Attending: Gastroenterology

## 2018-07-18 ENCOUNTER — Encounter (HOSPITAL_COMMUNITY): Payer: Self-pay | Admitting: Anesthesiology

## 2018-07-18 DIAGNOSIS — Z5309 Procedure and treatment not carried out because of other contraindication: Secondary | ICD-10-CM | POA: Diagnosis not present

## 2018-07-18 DIAGNOSIS — K625 Hemorrhage of anus and rectum: Secondary | ICD-10-CM | POA: Diagnosis not present

## 2018-07-18 SURGERY — CANCELLED PROCEDURE
Anesthesia: Monitor Anesthesia Care

## 2018-07-18 MED ORDER — SODIUM CHLORIDE 0.9 % IV SOLN: INTRAVENOUS | Status: DC

## 2018-07-18 MED ORDER — LACTATED RINGERS IV SOLN: INTRAVENOUS | Status: DC

## 2018-07-18 SURGICAL SUPPLY — 22 items

## 2018-07-18 NOTE — Anesthesia Preprocedure Evaluation (Deleted)
Anesthesia Evaluation    Reviewed: Allergy & Precautions, Patient's Chart, lab work & pertinent test results  Airway        Dental   Pulmonary former smoker,           Cardiovascular hypertension, Pt. on medications      Neuro/Psych negative neurological ROS     GI/Hepatic GERD  Controlled and Medicated,(+)     substance abuse  alcohol use,   Endo/Other  negative endocrine ROSdiabetes, Type 2, Insulin Dependent  Renal/GU negative Renal ROS     Musculoskeletal  (+) Arthritis ,   Abdominal   Peds  Hematology negative hematology ROS (+)   Anesthesia Other Findings Day of surgery medications reviewed with the patient.  Reproductive/Obstetrics                             Anesthesia Physical Anesthesia Plan  ASA: II  Anesthesia Plan: MAC   Post-op Pain Management:    Induction:   PONV Risk Score and Plan: Treatment may vary due to age or medical condition and Propofol infusion  Airway Management Planned: Natural Airway and Nasal Cannula  Additional Equipment:   Intra-op Plan:   Post-operative Plan:   Informed Consent:   Plan Discussed with:   Anesthesia Plan Comments:         Anesthesia Quick Evaluation

## 2018-07-18 NOTE — Progress Notes (Signed)
Patient ate breakfast, lunch and dinner yesterday.  Dr. Michail Sermon talked to patient and cancelled colonoscopy due to not following instructions.  Patient verbalized understanding and will follow up with office to reschedule.  Vista Lawman, RN

## 2018-07-19 ENCOUNTER — Other Ambulatory Visit: Payer: Self-pay | Admitting: Family Medicine

## 2018-07-19 DIAGNOSIS — E1169 Type 2 diabetes mellitus with other specified complication: Secondary | ICD-10-CM

## 2018-07-20 MED ORDER — FENTANYL CITRATE (PF) 250 MCG/5ML IJ SOLN
INTRAMUSCULAR | Status: AC
  Filled 2018-07-20: qty 5

## 2018-07-20 MED ORDER — FLUORESCEIN SODIUM 10 % IV SOLN
INTRAVENOUS | Status: AC
  Filled 2018-07-20: qty 5

## 2018-07-20 MED ORDER — WHITE PETROLATUM EX OINT
TOPICAL_OINTMENT | CUTANEOUS | Status: AC
  Filled 2018-07-20: qty 5

## 2018-07-20 MED ORDER — LIDOCAINE 2% (20 MG/ML) 5 ML SYRINGE
INTRAMUSCULAR | Status: AC
  Filled 2018-07-20: qty 5

## 2018-07-20 MED ORDER — ONDANSETRON HCL 4 MG/2ML IJ SOLN
INTRAMUSCULAR | Status: AC
  Filled 2018-07-20: qty 2

## 2018-07-20 MED ORDER — DEXAMETHASONE SODIUM PHOSPHATE 10 MG/ML IJ SOLN
INTRAMUSCULAR | Status: AC
  Filled 2018-07-20: qty 1

## 2018-07-20 MED ORDER — ROCURONIUM BROMIDE 10 MG/ML (PF) SYRINGE
PREFILLED_SYRINGE | INTRAVENOUS | Status: AC
  Filled 2018-07-20: qty 10

## 2018-07-20 MED ORDER — KETOROLAC TROMETHAMINE 30 MG/ML IJ SOLN
INTRAMUSCULAR | Status: AC
  Filled 2018-07-20: qty 1

## 2018-07-20 MED ORDER — MIDAZOLAM HCL 2 MG/2ML IJ SOLN
INTRAMUSCULAR | Status: AC
  Filled 2018-07-20: qty 2

## 2018-07-20 MED ORDER — PROPOFOL 10 MG/ML IV BOLUS
INTRAVENOUS | Status: AC
  Filled 2018-07-20: qty 20

## 2018-07-20 MED ORDER — EPHEDRINE 5 MG/ML INJ
INTRAVENOUS | Status: AC
  Filled 2018-07-20: qty 10

## 2018-07-20 MED ORDER — SUGAMMADEX SODIUM 200 MG/2ML IV SOLN
INTRAVENOUS | Status: AC
  Filled 2018-07-20: qty 2

## 2018-07-21 ENCOUNTER — Inpatient Hospital Stay: Payer: BLUE CROSS/BLUE SHIELD | Attending: Internal Medicine

## 2018-07-21 ENCOUNTER — Encounter (HOSPITAL_COMMUNITY): Payer: Self-pay | Admitting: Radiology

## 2018-07-21 ENCOUNTER — Ambulatory Visit (HOSPITAL_COMMUNITY)
Admission: RE | Admit: 2018-07-21 | Discharge: 2018-07-21 | Disposition: A | Discharge status: Home / Self Care | Payer: BLUE CROSS/BLUE SHIELD | Source: Ambulatory Visit | Attending: Internal Medicine | Admitting: Internal Medicine

## 2018-07-21 DIAGNOSIS — C3491 Malignant neoplasm of unspecified part of right bronchus or lung: Secondary | ICD-10-CM

## 2018-07-21 DIAGNOSIS — Z85118 Personal history of other malignant neoplasm of bronchus and lung: Secondary | ICD-10-CM | POA: Insufficient documentation

## 2018-07-21 DIAGNOSIS — C3490 Malignant neoplasm of unspecified part of unspecified bronchus or lung: Secondary | ICD-10-CM

## 2018-07-21 DIAGNOSIS — Z923 Personal history of irradiation: Secondary | ICD-10-CM | POA: Insufficient documentation

## 2018-07-21 DIAGNOSIS — Z9221 Personal history of antineoplastic chemotherapy: Secondary | ICD-10-CM | POA: Diagnosis not present

## 2018-07-21 DIAGNOSIS — Z5112 Encounter for antineoplastic immunotherapy: Secondary | ICD-10-CM

## 2018-07-21 LAB — CBC WITH DIFFERENTIAL (CANCER CENTER ONLY)
Abs Immature Granulocytes: 0 10*3/uL (ref 0.00–0.07)
Basophils Absolute: 0.1 10*3/uL (ref 0.0–0.1)
Basophils Relative: 2 %
Eosinophils Absolute: 0.1 10*3/uL (ref 0.0–0.5)
Eosinophils Relative: 4 %
HCT: 33 % — ABNORMAL LOW (ref 39.0–52.0)
Hemoglobin: 11.1 g/dL — ABNORMAL LOW (ref 13.0–17.0)
Immature Granulocytes: 0 %
Lymphocytes Relative: 40 %
Lymphs Abs: 1.3 10*3/uL (ref 0.7–4.0)
MCH: 31.8 pg (ref 26.0–34.0)
MCHC: 33.6 g/dL (ref 30.0–36.0)
MCV: 94.6 fL (ref 80.0–100.0)
Monocytes Absolute: 0.3 10*3/uL (ref 0.1–1.0)
Monocytes Relative: 10 %
Neutro Abs: 1.4 10*3/uL — ABNORMAL LOW (ref 1.7–7.7)
Neutrophils Relative %: 44 %
Platelet Count: 207 10*3/uL (ref 150–400)
RBC: 3.49 MIL/uL — ABNORMAL LOW (ref 4.22–5.81)
RDW: 15.1 % (ref 11.5–15.5)
WBC Count: 3.2 10*3/uL — ABNORMAL LOW (ref 4.0–10.5)
nRBC: 0 % (ref 0.0–0.2)

## 2018-07-21 LAB — CMP (CANCER CENTER ONLY)
ALT: 16 U/L (ref 0–44)
AST: 26 U/L (ref 15–41)
Albumin: 3.6 g/dL (ref 3.5–5.0)
Alkaline Phosphatase: 89 U/L (ref 38–126)
Anion gap: 9 (ref 5–15)
BUN: 30 mg/dL — ABNORMAL HIGH (ref 8–23)
CO2: 24 mmol/L (ref 22–32)
Calcium: 8.5 mg/dL — ABNORMAL LOW (ref 8.9–10.3)
Chloride: 109 mmol/L (ref 98–111)
Creatinine: 1.43 mg/dL — ABNORMAL HIGH (ref 0.61–1.24)
GFR, Est AFR Am: 60 mL/min — ABNORMAL LOW (ref >60)
GFR, Estimated: 51 mL/min — ABNORMAL LOW (ref >60)
Glucose, Bld: 73 mg/dL (ref 70–99)
Potassium: 3.6 mmol/L (ref 3.5–5.1)
Sodium: 142 mmol/L (ref 135–145)
Total Bilirubin: 0.4 mg/dL (ref 0.3–1.2)
Total Protein: 8.1 g/dL (ref 6.5–8.1)

## 2018-07-21 LAB — TSH: TSH: 1.751 u[IU]/mL (ref 0.320–4.118)

## 2018-07-21 MED ORDER — IOHEXOL 300 MG/ML  SOLN
75.0000 mL | Freq: Once | INTRAMUSCULAR | Status: AC | PRN
  Administered 2018-07-21: 75 mL via INTRAVENOUS

## 2018-07-21 MED ORDER — SODIUM CHLORIDE (PF) 0.9 % IJ SOLN
INTRAMUSCULAR | Status: AC
  Filled 2018-07-21: qty 50

## 2018-07-24 ENCOUNTER — Inpatient Hospital Stay: Payer: BLUE CROSS/BLUE SHIELD | Admitting: Internal Medicine

## 2018-07-24 ENCOUNTER — Encounter: Payer: Self-pay | Admitting: Internal Medicine

## 2018-07-24 ENCOUNTER — Telehealth: Payer: Self-pay | Admitting: Internal Medicine

## 2018-07-24 ENCOUNTER — Telehealth: Payer: Self-pay | Admitting: Nurse Practitioner

## 2018-07-24 VITALS — BP 130/95 | HR 100 | Temp 97.8°F | Resp 18 | Ht 74.0 in | Wt 207.5 lb

## 2018-07-24 DIAGNOSIS — E119 Type 2 diabetes mellitus without complications: Secondary | ICD-10-CM

## 2018-07-24 DIAGNOSIS — C3431 Malignant neoplasm of lower lobe, right bronchus or lung: Secondary | ICD-10-CM | POA: Diagnosis not present

## 2018-07-24 DIAGNOSIS — Z9221 Personal history of antineoplastic chemotherapy: Secondary | ICD-10-CM | POA: Diagnosis not present

## 2018-07-24 DIAGNOSIS — Z85118 Personal history of other malignant neoplasm of bronchus and lung: Secondary | ICD-10-CM | POA: Diagnosis not present

## 2018-07-24 DIAGNOSIS — Z923 Personal history of irradiation: Secondary | ICD-10-CM

## 2018-07-24 DIAGNOSIS — C3490 Malignant neoplasm of unspecified part of unspecified bronchus or lung: Secondary | ICD-10-CM

## 2018-07-24 DIAGNOSIS — I10 Essential (primary) hypertension: Secondary | ICD-10-CM | POA: Diagnosis not present
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
## 2018-07-24 NOTE — Telephone Encounter (Signed)
Will route to PCP for review. 

## 2018-07-24 NOTE — Telephone Encounter (Signed)
Pt came in to request a medication refill on -metFORMIN (GLUCOPHAGE) 500 MG  But it was discontinued on 04/26/2018, please follow up with patient because he states he needs a refill until his next appt [place]

## 2018-07-24 NOTE — Telephone Encounter (Signed)
Printed calendar and avs. °

## 2018-07-24 NOTE — Telephone Encounter (Signed)
Metformin had been discontinued at his last visit to prevent hypoglycemia as his A1c was 4.7.

## 2018-07-25 NOTE — Telephone Encounter (Signed)
Patient was called and informed of medication being discontinued.

## 2018-07-27 ENCOUNTER — Other Ambulatory Visit: Payer: Self-pay | Admitting: Family Medicine

## 2018-08-09 ENCOUNTER — Encounter: Payer: Self-pay | Admitting: Family Medicine

## 2018-08-09 ENCOUNTER — Ambulatory Visit: Payer: BLUE CROSS/BLUE SHIELD | Admitting: Family Medicine

## 2018-08-09 ENCOUNTER — Ambulatory Visit (HOSPITAL_COMMUNITY)
Admission: RE | Admit: 2018-08-09 | Discharge: 2018-08-09 | Disposition: A | Discharge status: Home / Self Care | Payer: BLUE CROSS/BLUE SHIELD | Source: Ambulatory Visit | Attending: Family Medicine | Admitting: Family Medicine

## 2018-08-09 VITALS — BP 102/69 | HR 102 | Temp 97.7°F | Resp 16 | Ht 74.0 in | Wt 208.8 lb

## 2018-08-09 DIAGNOSIS — M1A9XX0 Chronic gout, unspecified, without tophus (tophi): Secondary | ICD-10-CM | POA: Diagnosis not present

## 2018-08-09 DIAGNOSIS — F17200 Nicotine dependence, unspecified, uncomplicated: Secondary | ICD-10-CM

## 2018-08-09 DIAGNOSIS — H9193 Unspecified hearing loss, bilateral: Secondary | ICD-10-CM

## 2018-08-09 DIAGNOSIS — M25562 Pain in left knee: Secondary | ICD-10-CM | POA: Insufficient documentation

## 2018-08-09 DIAGNOSIS — C3491 Malignant neoplasm of unspecified part of right bronchus or lung: Secondary | ICD-10-CM

## 2018-08-09 DIAGNOSIS — F17210 Nicotine dependence, cigarettes, uncomplicated: Secondary | ICD-10-CM

## 2018-08-09 DIAGNOSIS — H6121 Impacted cerumen, right ear: Secondary | ICD-10-CM

## 2018-08-09 DIAGNOSIS — I10 Essential (primary) hypertension: Secondary | ICD-10-CM

## 2018-08-09 DIAGNOSIS — G8929 Other chronic pain: Secondary | ICD-10-CM | POA: Insufficient documentation

## 2018-08-09 DIAGNOSIS — E1169 Type 2 diabetes mellitus with other specified complication: Secondary | ICD-10-CM | POA: Diagnosis not present

## 2018-08-09 LAB — POCT GLYCOSYLATED HEMOGLOBIN (HGB A1C): Hemoglobin A1C: 4.9 % (ref 4.0–5.6)

## 2018-08-09 LAB — GLUCOSE, POCT (MANUAL RESULT ENTRY): POC Glucose: 166 mg/dl — AB (ref 70–99)

## 2018-08-09 MED ORDER — MELOXICAM 7.5 MG PO TABS: 7.5000 mg | ORAL_TABLET | Freq: Every day | ORAL | 1 refills | Status: DC

## 2018-08-09 NOTE — Patient Instructions (Signed)
Earwax Buildup, Adult  The ears produce a substance called earwax that helps keep bacteria out of the ear and protects the skin in the ear canal. Occasionally, earwax can build up in the ear and cause discomfort or hearing loss.  What increases the risk?  This condition is more likely to develop in people who:  · Are male.  · Are elderly.  · Naturally produce more earwax.  · Clean their ears often with cotton swabs.  · Use earplugs often.  · Use in-ear headphones often.  · Wear hearing aids.  · Have narrow ear canals.  · Have earwax that is overly thick or sticky.  · Have eczema.  · Are dehydrated.  · Have excess hair in the ear canal.  What are the signs or symptoms?  Symptoms of this condition include:  · Reduced or muffled hearing.  · A feeling of fullness in the ear or feeling that the ear is plugged.  · Fluid coming from the ear.  · Ear pain.  · Ear itch.  · Ringing in the ear.  · Coughing.  · An obvious piece of earwax that can be seen inside the ear canal.  How is this diagnosed?  This condition may be diagnosed based on:  · Your symptoms.  · Your medical history.  · An ear exam. During the exam, your health care provider will look into your ear with an instrument called an otoscope.  You may have tests, including a hearing test.  How is this treated?  This condition may be treated by:  · Using ear drops to soften the earwax.  · Having the earwax removed by a health care provider. The health care provider may:  ? Flush the ear with water.  ? Use an instrument that has a loop on the end (curette).  ? Use a suction device.  · Surgery to remove the wax buildup. This may be done in severe cases.  Follow these instructions at home:    · Take over-the-counter and prescription medicines only as told by your health care provider.  · Do not put any objects, including cotton swabs, into your ear. You can clean the opening of your ear canal with a washcloth or facial tissue.  · Follow instructions from your health care  provider about cleaning your ears. Do not over-clean your ears.  · Drink enough fluid to keep your urine clear or pale yellow. This will help to thin the earwax.  · Keep all follow-up visits as told by your health care provider. If earwax builds up in your ears often or if you use hearing aids, consider seeing your health care provider for routine, preventive ear cleanings. Ask your health care provider how often you should schedule your cleanings.  · If you have hearing aids, clean them according to instructions from the manufacturer and your health care provider.  Contact a health care provider if:  · You have ear pain.  · You develop a fever.  · You have blood, pus, or other fluid coming from your ear.  · You have hearing loss.  · You have ringing in your ears that does not go away.  · Your symptoms do not improve with treatment.  · You feel like the room is spinning (vertigo).  Summary  · Earwax can build up in the ear and cause discomfort or hearing loss.  · The most common symptoms of this condition include reduced or muffled hearing and a feeling of   fullness in the ear or feeling that the ear is plugged.  · This condition may be diagnosed based on your symptoms, your medical history, and an ear exam.  · This condition may be treated by using ear drops to soften the earwax or by having the earwax removed by a health care provider.  · Do not put any objects, including cotton swabs, into your ear. You can clean the opening of your ear canal with a washcloth or facial tissue.  This information is not intended to replace advice given to you by your health care provider. Make sure you discuss any questions you have with your health care provider.  Document Released: 07/29/2004 Document Revised: 06/02/2017 Document Reviewed: 09/01/2016  [employer]vier Interactive Patient Education © 2019 [employer]
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
## 2018-08-11 ENCOUNTER — Telehealth: Payer: Self-pay

## 2018-08-11 NOTE — Telephone Encounter (Signed)
Patient was called and informed of results.

## 2018-08-11 NOTE — Telephone Encounter (Signed)
-----   Message from [name], MD sent at 08/10/2018  2:44 PM EST ----- Knee xray revealed small fluid in the joint which should improve with the anti inflammatory pill I prescribed. It also revealed an old ligamentous injury.

## 2018-09-01 ENCOUNTER — Other Ambulatory Visit: Payer: Self-pay | Admitting: Family Medicine

## 2018-09-01 DIAGNOSIS — R0981 Nasal congestion: Secondary | ICD-10-CM

## 2018-09-09 ENCOUNTER — Other Ambulatory Visit: Payer: Self-pay | Admitting: Family Medicine

## 2018-10-13 ENCOUNTER — Other Ambulatory Visit: Payer: Self-pay | Admitting: Internal Medicine

## 2018-10-19 ENCOUNTER — Telehealth: Payer: Self-pay | Admitting: *Deleted

## 2018-10-19 NOTE — Telephone Encounter (Signed)
Pt called requesting refill of Chantix, and wanting to know next appt.  Dr. Julien Nordmann notified.   Spoke with pt and informed him that Dr. Julien Nordmann would not authorize refill of Chantix - not for long term use.  Pt stated he still smokes intermittently. Informed him that a scheduler will mail appt calendar to pt's home.  Pt voiced understanding.

## 2018-10-31 ENCOUNTER — Other Ambulatory Visit: Payer: Self-pay | Admitting: Family Medicine

## 2018-11-06 ENCOUNTER — Other Ambulatory Visit: Payer: Self-pay | Admitting: Family Medicine

## 2018-11-06 DIAGNOSIS — K219 Gastro-esophageal reflux disease without esophagitis: Secondary | ICD-10-CM

## 2018-11-06 DIAGNOSIS — G8929 Other chronic pain: Secondary | ICD-10-CM

## 2018-11-06 DIAGNOSIS — M25562 Pain in left knee: Principal | ICD-10-CM

## 2018-11-09 ENCOUNTER — Ambulatory Visit: Payer: BLUE CROSS/BLUE SHIELD | Admitting: Family Medicine

## 2018-11-17 ENCOUNTER — Inpatient Hospital Stay: Payer: BLUE CROSS/BLUE SHIELD | Attending: Internal Medicine

## 2018-11-17 ENCOUNTER — Ambulatory Visit (HOSPITAL_COMMUNITY)
Admission: RE | Admit: 2018-11-17 | Discharge: 2018-11-17 | Disposition: A | Discharge status: Home / Self Care | Payer: BLUE CROSS/BLUE SHIELD | Source: Ambulatory Visit | Attending: Internal Medicine | Admitting: Internal Medicine

## 2018-11-17 ENCOUNTER — Other Ambulatory Visit: Payer: Self-pay

## 2018-11-17 DIAGNOSIS — M109 Gout, unspecified: Secondary | ICD-10-CM | POA: Insufficient documentation

## 2018-11-17 DIAGNOSIS — C3490 Malignant neoplasm of unspecified part of unspecified bronchus or lung: Secondary | ICD-10-CM | POA: Insufficient documentation

## 2018-11-17 DIAGNOSIS — C3431 Malignant neoplasm of lower lobe, right bronchus or lung: Secondary | ICD-10-CM | POA: Insufficient documentation

## 2018-11-17 DIAGNOSIS — E119 Type 2 diabetes mellitus without complications: Secondary | ICD-10-CM | POA: Diagnosis not present

## 2018-11-17 DIAGNOSIS — I10 Essential (primary) hypertension: Secondary | ICD-10-CM | POA: Insufficient documentation

## 2018-11-17 DIAGNOSIS — Z79899 Other long term (current) drug therapy: Secondary | ICD-10-CM | POA: Insufficient documentation

## 2018-11-17 DIAGNOSIS — F17200 Nicotine dependence, unspecified, uncomplicated: Secondary | ICD-10-CM | POA: Insufficient documentation

## 2018-11-17 LAB — CMP (CANCER CENTER ONLY)
ALT: 13 U/L (ref 0–44)
AST: 25 U/L (ref 15–41)
Albumin: 2.8 g/dL — ABNORMAL LOW (ref 3.5–5.0)
Alkaline Phosphatase: 126 U/L (ref 38–126)
Anion gap: 8 (ref 5–15)
BUN: 13 mg/dL (ref 8–23)
CO2: 24 mmol/L (ref 22–32)
Calcium: 8 mg/dL — ABNORMAL LOW (ref 8.9–10.3)
Chloride: 107 mmol/L (ref 98–111)
Creatinine: 1.23 mg/dL (ref 0.61–1.24)
GFR, Est AFR Am: >60 mL/min (ref >60)
GFR, Estimated: >60 mL/min (ref >60)
Glucose, Bld: 91 mg/dL (ref 70–99)
Potassium: 3.3 mmol/L — ABNORMAL LOW (ref 3.5–5.1)
Sodium: 139 mmol/L (ref 135–145)
Total Bilirubin: 0.4 mg/dL (ref 0.3–1.2)
Total Protein: 7.7 g/dL (ref 6.5–8.1)

## 2018-11-17 LAB — CBC WITH DIFFERENTIAL (CANCER CENTER ONLY)
Abs Immature Granulocytes: 0.01 10*3/uL (ref 0.00–0.07)
Basophils Absolute: 0 10*3/uL (ref 0.0–0.1)
Basophils Relative: 1 %
Eosinophils Absolute: 0.2 10*3/uL (ref 0.0–0.5)
Eosinophils Relative: 7 %
HCT: 32.6 % — ABNORMAL LOW (ref 39.0–52.0)
Hemoglobin: 10.6 g/dL — ABNORMAL LOW (ref 13.0–17.0)
Immature Granulocytes: 0 %
Lymphocytes Relative: 27 %
Lymphs Abs: 0.8 10*3/uL (ref 0.7–4.0)
MCH: 33.2 pg (ref 26.0–34.0)
MCHC: 32.5 g/dL (ref 30.0–36.0)
MCV: 102.2 fL — ABNORMAL HIGH (ref 80.0–100.0)
Monocytes Absolute: 0.3 10*3/uL (ref 0.1–1.0)
Monocytes Relative: 11 %
Neutro Abs: 1.5 10*3/uL — ABNORMAL LOW (ref 1.7–7.7)
Neutrophils Relative %: 54 %
Platelet Count: 133 10*3/uL — ABNORMAL LOW (ref 150–400)
RBC: 3.19 MIL/uL — ABNORMAL LOW (ref 4.22–5.81)
RDW: 13.4 % (ref 11.5–15.5)
WBC Count: 2.8 10*3/uL — ABNORMAL LOW (ref 4.0–10.5)
nRBC: 0 % (ref 0.0–0.2)

## 2018-11-17 MED ORDER — SODIUM CHLORIDE (PF) 0.9 % IJ SOLN
INTRAMUSCULAR | Status: AC
  Filled 2018-11-17: qty 50

## 2018-11-17 MED ORDER — IOHEXOL 300 MG/ML  SOLN
75.0000 mL | Freq: Once | INTRAMUSCULAR | Status: AC | PRN
  Administered 2018-11-17: 75 mL via INTRAVENOUS

## 2018-11-20 ENCOUNTER — Other Ambulatory Visit: Payer: Self-pay | Admitting: Family Medicine

## 2018-11-20 DIAGNOSIS — R0981 Nasal congestion: Secondary | ICD-10-CM

## 2018-11-22 ENCOUNTER — Inpatient Hospital Stay (HOSPITAL_BASED_OUTPATIENT_CLINIC_OR_DEPARTMENT_OTHER): Payer: BLUE CROSS/BLUE SHIELD | Admitting: Internal Medicine

## 2018-11-22 ENCOUNTER — Other Ambulatory Visit: Payer: Self-pay

## 2018-11-22 ENCOUNTER — Encounter: Payer: Self-pay | Admitting: Internal Medicine

## 2018-11-22 ENCOUNTER — Encounter: Payer: Self-pay | Admitting: *Deleted

## 2018-11-22 VITALS — BP 126/89 | HR 81 | Temp 98.7°F | Resp 18 | Ht 74.0 in | Wt 203.1 lb

## 2018-11-22 DIAGNOSIS — F17200 Nicotine dependence, unspecified, uncomplicated: Secondary | ICD-10-CM | POA: Diagnosis not present

## 2018-11-22 DIAGNOSIS — C3431 Malignant neoplasm of lower lobe, right bronchus or lung: Secondary | ICD-10-CM | POA: Diagnosis not present

## 2018-11-22 DIAGNOSIS — I10 Essential (primary) hypertension: Secondary | ICD-10-CM | POA: Diagnosis not present

## 2018-11-22 DIAGNOSIS — E119 Type 2 diabetes mellitus without complications: Secondary | ICD-10-CM | POA: Diagnosis not present

## 2018-11-22 DIAGNOSIS — Z79899 Other long term (current) drug therapy: Secondary | ICD-10-CM

## 2018-11-22 DIAGNOSIS — C3490 Malignant neoplasm of unspecified part of unspecified bronchus or lung: Secondary | ICD-10-CM

## 2018-11-22 MED ORDER — VARENICLINE TARTRATE 0.5 MG X 11 & 1 MG X 42 PO MISC: ORAL | 0 refills | Status: DC
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
## 2018-11-24 ENCOUNTER — Telehealth: Payer: Self-pay | Admitting: Internal Medicine

## 2018-11-24 ENCOUNTER — Telehealth: Payer: Self-pay | Admitting: *Deleted

## 2018-11-24 NOTE — Telephone Encounter (Signed)
MR faxed to [facility] - [phone]

## 2018-11-24 NOTE — Telephone Encounter (Signed)
Scheduled appt for 6 month follow up per 5/20 los - sent reminder letter in the mail with appt date and time

## 2019-01-10 ENCOUNTER — Other Ambulatory Visit: Payer: Self-pay | Admitting: Family Medicine

## 2019-01-10 DIAGNOSIS — M1A9XX0 Chronic gout, unspecified, without tophus (tophi): Secondary | ICD-10-CM

## 2019-01-15 ENCOUNTER — Ambulatory Visit: Payer: BC Managed Care – PPO | Attending: Family Medicine | Admitting: Family Medicine

## 2019-01-15 ENCOUNTER — Other Ambulatory Visit: Payer: Self-pay

## 2019-01-15 ENCOUNTER — Encounter: Payer: Self-pay | Admitting: Family Medicine

## 2019-01-15 ENCOUNTER — Other Ambulatory Visit: Payer: Self-pay | Admitting: Gastroenterology

## 2019-01-15 VITALS — BP 117/71 | HR 89 | Temp 98.2°F | Ht 74.0 in | Wt 203.0 lb

## 2019-01-15 DIAGNOSIS — I10 Essential (primary) hypertension: Secondary | ICD-10-CM | POA: Diagnosis not present

## 2019-01-15 DIAGNOSIS — E7800 Pure hypercholesterolemia, unspecified: Secondary | ICD-10-CM

## 2019-01-15 DIAGNOSIS — M1A9XX0 Chronic gout, unspecified, without tophus (tophi): Secondary | ICD-10-CM

## 2019-01-15 DIAGNOSIS — G8929 Other chronic pain: Secondary | ICD-10-CM

## 2019-01-15 DIAGNOSIS — R0981 Nasal congestion: Secondary | ICD-10-CM | POA: Diagnosis not present

## 2019-01-15 DIAGNOSIS — M25562 Pain in left knee: Secondary | ICD-10-CM

## 2019-01-15 DIAGNOSIS — E1149 Type 2 diabetes mellitus with other diabetic neurological complication: Secondary | ICD-10-CM

## 2019-01-15 DIAGNOSIS — E1169 Type 2 diabetes mellitus with other specified complication: Secondary | ICD-10-CM

## 2019-01-15 DIAGNOSIS — Z1159 Encounter for screening for other viral diseases: Secondary | ICD-10-CM

## 2019-01-15 LAB — POCT GLYCOSYLATED HEMOGLOBIN (HGB A1C): HbA1c, POC (controlled diabetic range): 4.9 % (ref 0.0–7.0)

## 2019-01-15 MED ORDER — GABAPENTIN 300 MG PO CAPS: 300.0000 mg | ORAL_CAPSULE | Freq: Two times a day (BID) | ORAL | 1 refills | Status: DC

## 2019-01-15 MED ORDER — ALLOPURINOL 300 MG PO TABS: 300.0000 mg | ORAL_TABLET | Freq: Every day | ORAL | 6 refills | Status: DC

## 2019-01-15 MED ORDER — CETIRIZINE HCL 10 MG PO TABS: 10.0000 mg | ORAL_TABLET | Freq: Every day | ORAL | 2 refills | Status: DC

## 2019-01-15 MED ORDER — AMLODIPINE BESYLATE 10 MG PO TABS: 10.0000 mg | ORAL_TABLET | Freq: Every day | ORAL | 1 refills | Status: DC

## 2019-01-15 MED ORDER — VARENICLINE TARTRATE 1 MG PO TABS: 1.0000 mg | ORAL_TABLET | Freq: Two times a day (BID) | ORAL | 2 refills | Status: DC

## 2019-01-15 MED ORDER — ATORVASTATIN CALCIUM 40 MG PO TABS: 40.0000 mg | ORAL_TABLET | Freq: Every day | ORAL | 6 refills | Status: DC

## 2019-01-15 NOTE — Patient Instructions (Signed)

## 2019-01-15 NOTE — Progress Notes (Signed)
Patient is having back pain. Patient is having trouble hearing.
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
## 2019-01-16 LAB — BASIC METABOLIC PANEL
BUN/Creatinine Ratio: 20 (ref 10–24)
BUN: 24 mg/dL (ref 8–27)
CO2: 22 mmol/L (ref 20–29)
Calcium: 8.9 mg/dL (ref 8.6–10.2)
Chloride: 95 mmol/L — ABNORMAL LOW (ref 96–106)
Creatinine, Ser: 1.21 mg/dL (ref 0.76–1.27)
GFR calc Af Amer: 73 mL/min/{1.73_m2} (ref >59)
GFR calc non Af Amer: 63 mL/min/{1.73_m2} (ref >59)
Glucose: 97 mg/dL (ref 65–99)
Potassium: 3.9 mmol/L (ref 3.5–5.2)
Sodium: 136 mmol/L (ref 134–144)

## 2019-01-16 LAB — HIV ANTIBODY (ROUTINE TESTING W REFLEX): HIV Screen 4th Generation wRfx: NONREACTIVE

## 2019-01-31 ENCOUNTER — Other Ambulatory Visit: Payer: Self-pay | Admitting: Gastroenterology

## 2019-02-01 ENCOUNTER — Other Ambulatory Visit (HOSPITAL_COMMUNITY): Payer: BC Managed Care – PPO

## 2019-02-05 ENCOUNTER — Encounter (HOSPITAL_COMMUNITY): Payer: Self-pay

## 2019-02-05 ENCOUNTER — Ambulatory Visit (HOSPITAL_COMMUNITY): Admit: 2019-02-05 | Payer: BC Managed Care – PPO | Admitting: Gastroenterology

## 2019-02-05 SURGERY — COLONOSCOPY WITH PROPOFOL
Anesthesia: Monitor Anesthesia Care

## 2019-02-18 ENCOUNTER — Other Ambulatory Visit: Payer: Self-pay | Admitting: Family Medicine

## 2019-02-23 ENCOUNTER — Other Ambulatory Visit: Payer: Self-pay | Admitting: Gastroenterology

## 2019-03-16 ENCOUNTER — Emergency Department (HOSPITAL_COMMUNITY): Payer: BC Managed Care – PPO

## 2019-03-16 ENCOUNTER — Encounter (HOSPITAL_COMMUNITY): Payer: Self-pay | Admitting: *Deleted

## 2019-03-16 ENCOUNTER — Other Ambulatory Visit: Payer: Self-pay

## 2019-03-16 DIAGNOSIS — Y939 Activity, unspecified: Secondary | ICD-10-CM | POA: Diagnosis not present

## 2019-03-16 DIAGNOSIS — Z87891 Personal history of nicotine dependence: Secondary | ICD-10-CM | POA: Insufficient documentation

## 2019-03-16 DIAGNOSIS — I10 Essential (primary) hypertension: Secondary | ICD-10-CM | POA: Insufficient documentation

## 2019-03-16 DIAGNOSIS — Y999 Unspecified external cause status: Secondary | ICD-10-CM | POA: Diagnosis not present

## 2019-03-16 DIAGNOSIS — Z85118 Personal history of other malignant neoplasm of bronchus and lung: Secondary | ICD-10-CM | POA: Diagnosis not present

## 2019-03-16 DIAGNOSIS — D696 Thrombocytopenia, unspecified: Secondary | ICD-10-CM | POA: Insufficient documentation

## 2019-03-16 DIAGNOSIS — D539 Nutritional anemia, unspecified: Secondary | ICD-10-CM | POA: Insufficient documentation

## 2019-03-16 DIAGNOSIS — S90512A Abrasion, left ankle, initial encounter: Secondary | ICD-10-CM | POA: Insufficient documentation

## 2019-03-16 DIAGNOSIS — W208XXA Other cause of strike by thrown, projected or falling object, initial encounter: Secondary | ICD-10-CM | POA: Diagnosis not present

## 2019-03-16 DIAGNOSIS — S99912A Unspecified injury of left ankle, initial encounter: Secondary | ICD-10-CM | POA: Diagnosis present

## 2019-03-16 DIAGNOSIS — Y929 Unspecified place or not applicable: Secondary | ICD-10-CM | POA: Insufficient documentation

## 2019-03-16 DIAGNOSIS — E119 Type 2 diabetes mellitus without complications: Secondary | ICD-10-CM | POA: Diagnosis not present

## 2019-03-16 DIAGNOSIS — R2243 Localized swelling, mass and lump, lower limb, bilateral: Secondary | ICD-10-CM | POA: Diagnosis not present

## 2019-03-16 DIAGNOSIS — R238 Other skin changes: Secondary | ICD-10-CM | POA: Diagnosis not present

## 2019-03-16 DIAGNOSIS — Z79899 Other long term (current) drug therapy: Secondary | ICD-10-CM | POA: Insufficient documentation

## 2019-03-16 LAB — COMPREHENSIVE METABOLIC PANEL
ALT: 34 U/L (ref 0–44)
AST: 73 U/L — ABNORMAL HIGH (ref 15–41)
Albumin: 3.2 g/dL — ABNORMAL LOW (ref 3.5–5.0)
Alkaline Phosphatase: 159 U/L — ABNORMAL HIGH (ref 38–126)
Anion gap: 10 (ref 5–15)
BUN: 11 mg/dL (ref 8–23)
CO2: 22 mmol/L (ref 22–32)
Calcium: 8.4 mg/dL — ABNORMAL LOW (ref 8.9–10.3)
Chloride: 107 mmol/L (ref 98–111)
Creatinine, Ser: 1.16 mg/dL (ref 0.61–1.24)
GFR calc Af Amer: >60 mL/min (ref >60)
GFR calc non Af Amer: >60 mL/min (ref >60)
Glucose, Bld: 88 mg/dL (ref 70–99)
Potassium: 3.4 mmol/L — ABNORMAL LOW (ref 3.5–5.1)
Sodium: 139 mmol/L (ref 135–145)
Total Bilirubin: 1 mg/dL (ref 0.3–1.2)
Total Protein: 7.6 g/dL (ref 6.5–8.1)

## 2019-03-16 LAB — CBC WITH DIFFERENTIAL/PLATELET
Abs Immature Granulocytes: 0 10*3/uL (ref 0.00–0.07)
Basophils Absolute: 0 10*3/uL (ref 0.0–0.1)
Basophils Relative: 0 %
Eosinophils Absolute: 0.1 10*3/uL (ref 0.0–0.5)
Eosinophils Relative: 4 %
HCT: 26.4 % — ABNORMAL LOW (ref 39.0–52.0)
Hemoglobin: 8.7 g/dL — ABNORMAL LOW (ref 13.0–17.0)
Immature Granulocytes: 0 %
Lymphocytes Relative: 35 %
Lymphs Abs: 0.8 10*3/uL (ref 0.7–4.0)
MCH: 34.3 pg — ABNORMAL HIGH (ref 26.0–34.0)
MCHC: 33 g/dL (ref 30.0–36.0)
MCV: 103.9 fL — ABNORMAL HIGH (ref 80.0–100.0)
Monocytes Absolute: 0.2 10*3/uL (ref 0.1–1.0)
Monocytes Relative: 10 %
Neutro Abs: 1.2 10*3/uL — ABNORMAL LOW (ref 1.7–7.7)
Neutrophils Relative %: 51 %
Platelets: 77 10*3/uL — ABNORMAL LOW (ref 150–400)
RBC: 2.54 MIL/uL — ABNORMAL LOW (ref 4.22–5.81)
RDW: 15.7 % — ABNORMAL HIGH (ref 11.5–15.5)
WBC: 2.3 10*3/uL — ABNORMAL LOW (ref 4.0–10.5)
nRBC: 0 % (ref 0.0–0.2)

## 2019-03-16 LAB — LACTIC ACID, PLASMA: Lactic Acid, Venous: 1.1 mmol/L (ref 0.5–1.9)

## 2019-03-16 LAB — CBG MONITORING, ED: Glucose-Capillary: 82 mg/dL (ref 70–99)

## 2019-03-16 MED ORDER — SODIUM CHLORIDE 0.9% FLUSH: 3.0000 mL | Freq: Once | INTRAVENOUS | Status: DC

## 2019-03-16 NOTE — ED Triage Notes (Addendum)
Pt reports that a mirror fell on the inner left side of his ankle (abrasion to left inner ankle, has been putting on neosporin and cleaning the area) on Monday, now having swelling and redness on the left leg. Denies fevers.

## 2019-03-17 ENCOUNTER — Emergency Department (HOSPITAL_COMMUNITY)
Admission: EM | Admit: 2019-03-17 | Discharge: 2019-03-17 | Disposition: A | Discharge status: Home / Self Care | Payer: BC Managed Care – PPO | Attending: Emergency Medicine | Admitting: Emergency Medicine

## 2019-03-17 ENCOUNTER — Other Ambulatory Visit: Payer: Self-pay | Admitting: Family Medicine

## 2019-03-17 DIAGNOSIS — D539 Nutritional anemia, unspecified: Secondary | ICD-10-CM

## 2019-03-17 DIAGNOSIS — K219 Gastro-esophageal reflux disease without esophagitis: Secondary | ICD-10-CM

## 2019-03-17 DIAGNOSIS — G8929 Other chronic pain: Secondary | ICD-10-CM

## 2019-03-17 DIAGNOSIS — D696 Thrombocytopenia, unspecified: Secondary | ICD-10-CM

## 2019-03-17 DIAGNOSIS — S90512A Abrasion, left ankle, initial encounter: Secondary | ICD-10-CM

## 2019-03-17 NOTE — Discharge Instructions (Signed)
You did not appear to have an infection at the site of your wound on your left ankle.  There is some bruising which should resolve on its own.  Continue elevating and icing your ankle to limit inflammation and swelling.  You may use an ankle brace for assistance with compression and stability  Your found to be anemic today with a low platelet count.  We believe that this is due to your heavy alcohol use.  Try to cut back on your drinking for this reason.  You should have your CBC blood test repeated by your primary care doctor in approximately 1 week.  Return to the ED for any new or concerning symptoms such as chest pain, shortness of breath, lightheadedness, passing out, black stools.
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
## 2019-04-17 ENCOUNTER — Other Ambulatory Visit: Payer: Self-pay | Admitting: Gastroenterology

## 2019-04-19 ENCOUNTER — Other Ambulatory Visit (HOSPITAL_COMMUNITY)
Admission: RE | Admit: 2019-04-19 | Discharge: 2019-04-19 | Disposition: A | Discharge status: Home / Self Care | Payer: BC Managed Care – PPO | Source: Ambulatory Visit | Attending: Gastroenterology | Admitting: Gastroenterology

## 2019-04-19 DIAGNOSIS — Z01812 Encounter for preprocedural laboratory examination: Secondary | ICD-10-CM | POA: Diagnosis not present

## 2019-04-19 DIAGNOSIS — Z20828 Contact with and (suspected) exposure to other viral communicable diseases: Secondary | ICD-10-CM | POA: Diagnosis not present

## 2019-04-20 ENCOUNTER — Other Ambulatory Visit: Payer: Self-pay

## 2019-04-21 ENCOUNTER — Other Ambulatory Visit: Payer: Self-pay | Admitting: Family Medicine

## 2019-04-21 DIAGNOSIS — G8929 Other chronic pain: Secondary | ICD-10-CM

## 2019-04-21 LAB — NOVEL CORONAVIRUS, NAA (HOSP ORDER, SEND-OUT TO REF LAB; TAT 18-24 HRS): SARS-CoV-2, NAA: NOT DETECTED

## 2019-04-22 NOTE — Anesthesia Preprocedure Evaluation (Addendum)
Anesthesia Evaluation  Patient identified by MRN, date of birth, ID band Patient awake    Reviewed: Allergy & Precautions, NPO status , Patient's Chart, lab work & pertinent test results  Airway Mallampati: II  TM Distance: >3 FB Neck ROM: Full    Dental no notable dental hx. (+) Edentulous Upper   Pulmonary neg pulmonary ROS, former smoker,    Pulmonary exam normal breath sounds clear to auscultation       Cardiovascular Exercise Tolerance: Good hypertension, Pt. on medications Normal cardiovascular exam Rhythm:Regular Rate:Normal     Neuro/Psych negative neurological ROS  negative psych ROS   GI/Hepatic Neg liver ROS,   Endo/Other  diabetes, Insulin Dependent  Renal/GU negative Renal ROS     Musculoskeletal  (+) Arthritis ,   Abdominal   Peds  Hematology negative hematology ROS (+)   Anesthesia Other Findings   Reproductive/Obstetrics negative OB ROS                            Anesthesia Physical Anesthesia Plan  ASA: III  Anesthesia Plan: MAC   Post-op Pain Management:    Induction: Intravenous  PONV Risk Score and Plan: 2 and Treatment may vary due to age or medical condition  Airway Management Planned: Nasal Cannula and Natural Airway  Additional Equipment: None  Intra-op Plan:   Post-operative Plan:   Informed Consent: I have reviewed the patients History and Physical, chart, labs and discussed the procedure including the risks, benefits and alternatives for the proposed anesthesia with the patient or authorized representative who has indicated his/her understanding and acceptance.     Dental advisory given  Plan Discussed with: CRNA  Anesthesia Plan Comments: (Colopnoscopy for rectal bleeding)       Anesthesia Quick Evaluation

## 2019-04-23 ENCOUNTER — Ambulatory Visit (HOSPITAL_COMMUNITY): Payer: BC Managed Care – PPO | Admitting: Anesthesiology

## 2019-04-23 ENCOUNTER — Encounter (HOSPITAL_COMMUNITY): Payer: Self-pay | Admitting: *Deleted

## 2019-04-23 ENCOUNTER — Ambulatory Visit (HOSPITAL_COMMUNITY)
Admission: RE | Admit: 2019-04-23 | Discharge: 2019-04-23 | Disposition: A | Discharge status: Home / Self Care | Payer: BC Managed Care – PPO | Attending: Gastroenterology | Admitting: Gastroenterology

## 2019-04-23 ENCOUNTER — Other Ambulatory Visit: Payer: Self-pay

## 2019-04-23 ENCOUNTER — Encounter (HOSPITAL_COMMUNITY)
Admission: RE | Disposition: A | Discharge status: Home / Self Care | Payer: Self-pay | Source: Home / Self Care | Attending: Gastroenterology

## 2019-04-23 DIAGNOSIS — K640 First degree hemorrhoids: Secondary | ICD-10-CM | POA: Diagnosis not present

## 2019-04-23 DIAGNOSIS — E1140 Type 2 diabetes mellitus with diabetic neuropathy, unspecified: Secondary | ICD-10-CM | POA: Diagnosis not present

## 2019-04-23 DIAGNOSIS — M1990 Unspecified osteoarthritis, unspecified site: Secondary | ICD-10-CM | POA: Insufficient documentation

## 2019-04-23 DIAGNOSIS — Z79899 Other long term (current) drug therapy: Secondary | ICD-10-CM | POA: Diagnosis not present

## 2019-04-23 DIAGNOSIS — E7800 Pure hypercholesterolemia, unspecified: Secondary | ICD-10-CM | POA: Diagnosis not present

## 2019-04-23 DIAGNOSIS — Z85118 Personal history of other malignant neoplasm of bronchus and lung: Secondary | ICD-10-CM | POA: Diagnosis not present

## 2019-04-23 DIAGNOSIS — D122 Benign neoplasm of ascending colon: Secondary | ICD-10-CM | POA: Diagnosis not present

## 2019-04-23 DIAGNOSIS — F17210 Nicotine dependence, cigarettes, uncomplicated: Secondary | ICD-10-CM | POA: Insufficient documentation

## 2019-04-23 DIAGNOSIS — M109 Gout, unspecified: Secondary | ICD-10-CM | POA: Insufficient documentation

## 2019-04-23 DIAGNOSIS — I10 Essential (primary) hypertension: Secondary | ICD-10-CM | POA: Insufficient documentation

## 2019-04-23 DIAGNOSIS — K219 Gastro-esophageal reflux disease without esophagitis: Secondary | ICD-10-CM | POA: Insufficient documentation

## 2019-04-23 DIAGNOSIS — K5730 Diverticulosis of large intestine without perforation or abscess without bleeding: Secondary | ICD-10-CM | POA: Diagnosis not present

## 2019-04-23 DIAGNOSIS — Z888 Allergy status to other drugs, medicaments and biological substances status: Secondary | ICD-10-CM | POA: Diagnosis not present

## 2019-04-23 DIAGNOSIS — K625 Hemorrhage of anus and rectum: Secondary | ICD-10-CM | POA: Insufficient documentation

## 2019-04-23 HISTORY — PX: COLONOSCOPY WITH PROPOFOL: SHX5780

## 2019-04-23 HISTORY — PX: POLYPECTOMY: SHX5525

## 2019-04-23 SURGERY — COLONOSCOPY WITH PROPOFOL
Anesthesia: Monitor Anesthesia Care

## 2019-04-23 MED ORDER — LIDOCAINE HCL (CARDIAC) PF 100 MG/5ML IV SOSY
PREFILLED_SYRINGE | INTRAVENOUS | Status: DC | PRN
  Administered 2019-04-23: 60 mg via INTRATRACHEAL

## 2019-04-23 MED ORDER — PROPOFOL 10 MG/ML IV BOLUS
INTRAVENOUS | Status: AC
  Filled 2019-04-23: qty 40

## 2019-04-23 MED ORDER — PROPOFOL 500 MG/50ML IV EMUL
INTRAVENOUS | Status: DC | PRN
  Administered 2019-04-23 (×2): 20 mg via INTRAVENOUS
  Administered 2019-04-23: 40 mg via INTRAVENOUS
  Administered 2019-04-23: 20 mg via INTRAVENOUS

## 2019-04-23 MED ORDER — LACTATED RINGERS IV SOLN
INTRAVENOUS | Status: DC
  Administered 2019-04-23: 08:00:00 1000 mL via INTRAVENOUS

## 2019-04-23 MED ORDER — PROPOFOL 500 MG/50ML IV EMUL
INTRAVENOUS | Status: DC | PRN
  Administered 2019-04-23: 125 ug/kg/min via INTRAVENOUS

## 2019-04-23 MED ORDER — SODIUM CHLORIDE 0.9 % IV SOLN: INTRAVENOUS | Status: DC

## 2019-04-23 SURGICAL SUPPLY — 22 items

## 2019-04-23 NOTE — Transfer of Care (Signed)
Immediate Anesthesia Transfer of Care Note  Patient: [name]  Procedure(s) Performed: COLONOSCOPY WITH PROPOFOL (N/A ) POLYPECTOMY  Patient Location: PACU  Anesthesia Type:MAC  Level of Consciousness: drowsy, patient cooperative and responds to stimulation  Airway & Oxygen Therapy: Patient Spontanous Breathing and Patient connected to face mask oxygen  Post-op Assessment: Report given to RN and Post -op Vital signs reviewed and stable  Post vital signs: Reviewed and stable  Last Vitals:  Vitals Value Taken Time  BP    Temp    Pulse    Resp    SpO2      Last Pain:  Vitals:   04/23/19 0751  TempSrc: Oral  PainSc: 0-No pain         Complications: No apparent anesthesia complications

## 2019-04-23 NOTE — H&P (Signed)
Date of Initial H&P: 04/17/19  History reviewed, patient examined, no change in status, stable for surgery.

## 2019-04-23 NOTE — Op Note (Signed)
[facility] Patient Name: [name] Procedure Date: 04/23/2019 MRN: [id number] Attending MD: Lear Ng , MD Date of Birth: 1955-08-09 CSN: [id number] Age: 63 Admit Type: Outpatient Procedure:                Colonoscopy Indications:              Last colonoscopy: October 2014, Rectal bleeding Providers:                Lear Ng, MD, Angus Seller, Janie                            [place], Technician, Rosario Adie, CRNA Referring MD:             [name] Medicines:                Propofol per Anesthesia, Monitored Anesthesia Care Complications:            No immediate complications. Estimated Blood Loss:     Estimated blood loss: none. Procedure:                Pre-Anesthesia Assessment:                           - Prior to the procedure, a History and Physical                            was performed, and patient medications and                            allergies were reviewed. [name] tolerance of                            previous anesthesia was also reviewed. The risks                            and benefits of the procedure and the sedation                            options and risks were discussed with the patient.                            All questions were answered, and informed consent                            was obtained. Prior Anticoagulants: The patient has                            taken no previous anticoagulant or antiplatelet                            agents. ASA Grade Assessment: III - A patient with                            severe systemic disease. After reviewing the risks  and benefits, the patient was deemed in                            satisfactory condition to undergo the procedure.                           After obtaining informed consent, the colonoscope                            was passed under direct vision. Throughout the   procedure, the patient's blood pressure, pulse, and                            oxygen saturations were monitored continuously. The                            PCF-H190DL ([id number] Olympus pediatric colonscope                            was introduced through the anus and advanced to the                            the cecum, identified by appendiceal orifice and                            ileocecal valve. The colonoscopy was performed                            without difficulty. The patient tolerated the                            procedure well. The quality of the bowel                            preparation was adequate and good. The ileocecal                            valve, appendiceal orifice, and rectum were                            photographed. Scope In: 9:22:49 AM Scope Out: 9:37:47 AM Scope Withdrawal Time: 0 hours 9 minutes 26 seconds  Total Procedure Duration: 0 hours 14 minutes 58 seconds  Findings:      The perianal and digital rectal examinations were normal.      A 12 mm polyp was found in the ascending colon. The polyp was sessile.       The polyp was removed with a hot snare. Resection and retrieval were       complete. Estimated blood loss: none.      A diffuse area of severely hemorrhagic mucosa was found in the ascending       colon and in the cecum.      Scattered small and large-mouthed diverticula were found in the sigmoid       colon, descending colon and ascending colon.      Internal hemorrhoids were found during  retroflexion. The hemorrhoids       were large and Grade I (internal hemorrhoids that do not prolapse). Impression:               - One 12 mm polyp in the ascending colon, removed                            with a hot snare. Resected and retrieved.                           - Hemorrhagic mucosa in the ascending colon and in                            the cecum.                           - Diverticulosis in the sigmoid colon, in the                             descending colon and in the ascending colon.                           - Internal hemorrhoids. Moderate Sedation:      Not Applicable - Patient had care per Anesthesia. Recommendation:           - Await pathology results.                           - No aspirin, ibuprofen, naproxen, or other                            non-steroidal anti-inflammatory drugs for 2 weeks.                           - High fiber diet.                           - Repeat colonoscopy for surveillance based on                            pathology results. Procedure Code(s):        --- Professional ---                           [phone], Colonoscopy, flexible; with removal of                            tumor(s), polyp(s), or other lesion(s) by snare                            technique Diagnosis Code(s):        --- Professional ---                           K62.5, Hemorrhage of anus and rectum  K92.2, Gastrointestinal hemorrhage, unspecified                           K63.5, Polyp of colon                           K64.0, First degree hemorrhoids                           K57.30, Diverticulosis of large intestine without                            perforation or abscess without bleeding CPT copyright 2019 American Medical Association. All rights reserved. The codes documented in this report are preliminary and upon coder review may  be revised to meet current compliance requirements. Lear Ng, MD 04/23/2019 10:06:52 AM This report has been signed electronically. Number of Addenda: 0

## 2019-04-23 NOTE — Discharge Instructions (Signed)

## 2019-04-23 NOTE — Interval H&P Note (Signed)
History and Physical Interval Note:  04/23/2019 9:06 AM  [name]  has presented today for surgery, with the diagnosis of Rectal bleeding.  The various methods of treatment have been discussed with the patient and family. After consideration of risks, benefits and other options for treatment, the patient has consented to  Procedure(s): COLONOSCOPY WITH PROPOFOL (N/A) as a surgical intervention.  [name] history has been reviewed, patient examined, no change in status, stable for surgery.  I have reviewed the patient's chart and labs.  Questions were answered to the patient's satisfaction.     Lear Ng

## 2019-04-23 NOTE — Anesthesia Procedure Notes (Signed)
Date/Time: 04/23/2019 9:11 AM Performed by: Glory Buff, CRNA Oxygen Delivery Method: Simple face mask

## 2019-04-23 NOTE — Anesthesia Postprocedure Evaluation (Signed)
Anesthesia Post Note  Patient: [name]  Procedure(s) Performed: COLONOSCOPY WITH PROPOFOL (N/A ) POLYPECTOMY     Patient location during evaluation: Endoscopy Anesthesia Type: MAC Level of consciousness: awake and alert Pain management: pain level controlled Vital Signs Assessment: post-procedure vital signs reviewed and stable Respiratory status: spontaneous breathing, nonlabored ventilation, respiratory function stable and patient connected to nasal cannula oxygen Cardiovascular status: blood pressure returned to baseline and stable Postop Assessment: no apparent nausea or vomiting Anesthetic complications: no    Last Vitals:  Vitals:   04/23/19 0751 04/23/19 0948  BP: 121/78 107/70  Pulse: 69 70  Resp: 19 19  Temp: (!) 36.4 C (!) 36.3 C  SpO2: 100% 100%    Last Pain:  Vitals:   04/23/19 0948  TempSrc: Temporal  PainSc: 0-No pain                 Barnet Glasgow

## 2019-04-24 ENCOUNTER — Encounter (HOSPITAL_COMMUNITY): Payer: Self-pay | Admitting: Gastroenterology

## 2019-04-24 LAB — SURGICAL PATHOLOGY

## 2019-04-26 ENCOUNTER — Other Ambulatory Visit: Payer: Self-pay | Admitting: Family Medicine

## 2019-04-26 DIAGNOSIS — E7800 Pure hypercholesterolemia, unspecified: Secondary | ICD-10-CM

## 2019-05-21 ENCOUNTER — Other Ambulatory Visit: Payer: Self-pay | Admitting: Family Medicine

## 2019-05-21 DIAGNOSIS — M25562 Pain in left knee: Secondary | ICD-10-CM

## 2019-05-21 DIAGNOSIS — G8929 Other chronic pain: Secondary | ICD-10-CM

## 2019-05-25 ENCOUNTER — Encounter (HOSPITAL_COMMUNITY): Payer: Self-pay

## 2019-05-25 ENCOUNTER — Ambulatory Visit (HOSPITAL_COMMUNITY)
Admission: RE | Admit: 2019-05-25 | Discharge: 2019-05-25 | Disposition: A | Discharge status: Home / Self Care | Payer: BC Managed Care – PPO | Source: Ambulatory Visit | Attending: Internal Medicine | Admitting: Internal Medicine

## 2019-05-25 ENCOUNTER — Other Ambulatory Visit: Payer: Self-pay

## 2019-05-25 ENCOUNTER — Telehealth: Payer: Self-pay | Admitting: Internal Medicine

## 2019-05-25 ENCOUNTER — Inpatient Hospital Stay: Payer: BC Managed Care – PPO | Attending: Internal Medicine

## 2019-05-25 DIAGNOSIS — C3490 Malignant neoplasm of unspecified part of unspecified bronchus or lung: Secondary | ICD-10-CM | POA: Diagnosis not present

## 2019-05-25 DIAGNOSIS — C3431 Malignant neoplasm of lower lobe, right bronchus or lung: Secondary | ICD-10-CM | POA: Insufficient documentation

## 2019-05-25 LAB — CBC WITH DIFFERENTIAL (CANCER CENTER ONLY)
Abs Immature Granulocytes: 0.02 10*3/uL (ref 0.00–0.07)
Basophils Absolute: 0 10*3/uL (ref 0.0–0.1)
Basophils Relative: 1 %
Eosinophils Absolute: 0.1 10*3/uL (ref 0.0–0.5)
Eosinophils Relative: 2 %
HCT: 30.4 % — ABNORMAL LOW (ref 39.0–52.0)
Hemoglobin: 10.2 g/dL — ABNORMAL LOW (ref 13.0–17.0)
Immature Granulocytes: 1 %
Lymphocytes Relative: 30 %
Lymphs Abs: 0.9 10*3/uL (ref 0.7–4.0)
MCH: 34 pg (ref 26.0–34.0)
MCHC: 33.6 g/dL (ref 30.0–36.0)
MCV: 101.3 fL — ABNORMAL HIGH (ref 80.0–100.0)
Monocytes Absolute: 0.3 10*3/uL (ref 0.1–1.0)
Monocytes Relative: 10 %
Neutro Abs: 1.7 10*3/uL (ref 1.7–7.7)
Neutrophils Relative %: 56 %
Platelet Count: 107 10*3/uL — ABNORMAL LOW (ref 150–400)
RBC: 3 MIL/uL — ABNORMAL LOW (ref 4.22–5.81)
RDW: 15.3 % (ref 11.5–15.5)
WBC Count: 3 10*3/uL — ABNORMAL LOW (ref 4.0–10.5)
nRBC: 0 % (ref 0.0–0.2)

## 2019-05-25 LAB — CMP (CANCER CENTER ONLY)
ALT: 20 U/L (ref 0–44)
AST: 37 U/L (ref 15–41)
Albumin: 3 g/dL — ABNORMAL LOW (ref 3.5–5.0)
Alkaline Phosphatase: 158 U/L — ABNORMAL HIGH (ref 38–126)
Anion gap: 9 (ref 5–15)
BUN: 19 mg/dL (ref 8–23)
CO2: 23 mmol/L (ref 22–32)
Calcium: 8.3 mg/dL — ABNORMAL LOW (ref 8.9–10.3)
Chloride: 108 mmol/L (ref 98–111)
Creatinine: 1.34 mg/dL — ABNORMAL HIGH (ref 0.61–1.24)
GFR, Est AFR Am: >60 mL/min (ref >60)
GFR, Estimated: 56 mL/min — ABNORMAL LOW (ref >60)
Glucose, Bld: 122 mg/dL — ABNORMAL HIGH (ref 70–99)
Potassium: 3.6 mmol/L (ref 3.5–5.1)
Sodium: 140 mmol/L (ref 135–145)
Total Bilirubin: 0.8 mg/dL (ref 0.3–1.2)
Total Protein: 7.6 g/dL (ref 6.5–8.1)

## 2019-05-25 MED ORDER — SODIUM CHLORIDE (PF) 0.9 % IJ SOLN
INTRAMUSCULAR | Status: AC
  Filled 2019-05-25: qty 50

## 2019-05-25 MED ORDER — IOHEXOL 300 MG/ML  SOLN
75.0000 mL | Freq: Once | INTRAMUSCULAR | Status: AC | PRN
  Administered 2019-05-25: 75 mL via INTRAVENOUS

## 2019-05-25 NOTE — Telephone Encounter (Signed)
Converted 11/23 appointment to telephone visit. Confirmed with patient.

## 2019-05-25 NOTE — Telephone Encounter (Signed)
Returned patient's phone call regarding phone visit request, informed patient provider approved for 11/23 appointment to be a phone visit.

## 2019-05-28 ENCOUNTER — Inpatient Hospital Stay (HOSPITAL_BASED_OUTPATIENT_CLINIC_OR_DEPARTMENT_OTHER): Payer: BC Managed Care – PPO | Admitting: Internal Medicine

## 2019-05-28 ENCOUNTER — Encounter: Payer: Self-pay | Admitting: Internal Medicine

## 2019-05-28 DIAGNOSIS — C3491 Malignant neoplasm of unspecified part of right bronchus or lung: Secondary | ICD-10-CM

## 2019-05-28 DIAGNOSIS — C3490 Malignant neoplasm of unspecified part of unspecified bronchus or lung: Secondary | ICD-10-CM
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
## 2019-05-29 ENCOUNTER — Telehealth: Payer: Self-pay | Admitting: Internal Medicine

## 2019-05-29 NOTE — Telephone Encounter (Signed)
Scheduled per los. Called and left msg. Mailed printout  °

## 2019-07-10 ENCOUNTER — Other Ambulatory Visit: Payer: Self-pay | Admitting: Family Medicine

## 2019-07-10 DIAGNOSIS — E1149 Type 2 diabetes mellitus with other diabetic neurological complication: Secondary | ICD-10-CM

## 2019-07-10 DIAGNOSIS — I10 Essential (primary) hypertension: Secondary | ICD-10-CM

## 2019-07-16 ENCOUNTER — Other Ambulatory Visit: Payer: Self-pay

## 2019-07-16 ENCOUNTER — Ambulatory Visit: Payer: BC Managed Care – PPO | Attending: Family Medicine | Admitting: Family Medicine

## 2019-07-16 ENCOUNTER — Encounter: Payer: Self-pay | Admitting: Family Medicine

## 2019-07-16 VITALS — BP 142/87 | HR 84 | Temp 98.2°F | Ht 74.0 in | Wt 193.0 lb

## 2019-07-16 DIAGNOSIS — M1A9XX0 Chronic gout, unspecified, without tophus (tophi): Secondary | ICD-10-CM | POA: Diagnosis not present

## 2019-07-16 DIAGNOSIS — R0981 Nasal congestion: Secondary | ICD-10-CM

## 2019-07-16 DIAGNOSIS — I10 Essential (primary) hypertension: Secondary | ICD-10-CM | POA: Diagnosis not present

## 2019-07-16 DIAGNOSIS — E7800 Pure hypercholesterolemia, unspecified: Secondary | ICD-10-CM | POA: Diagnosis not present

## 2019-07-16 DIAGNOSIS — E1169 Type 2 diabetes mellitus with other specified complication: Secondary | ICD-10-CM | POA: Diagnosis not present

## 2019-07-16 DIAGNOSIS — C3491 Malignant neoplasm of unspecified part of right bronchus or lung: Secondary | ICD-10-CM

## 2019-07-16 LAB — POCT GLYCOSYLATED HEMOGLOBIN (HGB A1C): HbA1c, POC (controlled diabetic range): 4.2 % (ref 0.0–7.0)

## 2019-07-16 LAB — GLUCOSE, POCT (MANUAL RESULT ENTRY): POC Glucose: 76 mg/dl (ref 70–99)

## 2019-07-16 MED ORDER — ALLOPURINOL 300 MG PO TABS: 300.0000 mg | ORAL_TABLET | Freq: Every day | ORAL | 1 refills | Status: DC

## 2019-07-16 MED ORDER — CETIRIZINE HCL 10 MG PO TABS: 10.0000 mg | ORAL_TABLET | Freq: Every day | ORAL | 2 refills | Status: DC

## 2019-07-16 MED ORDER — AMLODIPINE BESYLATE 10 MG PO TABS: ORAL_TABLET | ORAL | 1 refills | Status: DC

## 2019-07-16 MED ORDER — ATORVASTATIN CALCIUM 40 MG PO TABS: ORAL_TABLET | ORAL | 1 refills | Status: DC
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
## 2019-07-17 LAB — MICROALBUMIN / CREATININE URINE RATIO
Creatinine, Urine: 88 mg/dL
Microalb/Creat Ratio: 4 mg/g creat (ref 0–29)
Microalbumin, Urine: 3.2 ug/mL

## 2019-07-19 ENCOUNTER — Telehealth: Payer: Self-pay

## 2019-07-19 NOTE — Telephone Encounter (Signed)
-----   Message from [name], MD sent at 07/17/2019  1:52 PM EST ----- Please inform the patient that labs are normal. Thank you.

## 2019-07-19 NOTE — Telephone Encounter (Signed)
Patient name and DOB has been verified Patient was informed of lab results. Patient had no questions.  

## 2019-10-07 ENCOUNTER — Other Ambulatory Visit: Payer: Self-pay | Admitting: Family Medicine

## 2019-10-07 DIAGNOSIS — K219 Gastro-esophageal reflux disease without esophagitis: Secondary | ICD-10-CM

## 2019-10-08 DIAGNOSIS — H0102A Squamous blepharitis right eye, upper and lower eyelids: Secondary | ICD-10-CM | POA: Diagnosis not present

## 2019-10-08 DIAGNOSIS — H0102B Squamous blepharitis left eye, upper and lower eyelids: Secondary | ICD-10-CM | POA: Diagnosis not present

## 2019-10-08 DIAGNOSIS — H40013 Open angle with borderline findings, low risk, bilateral: Secondary | ICD-10-CM | POA: Diagnosis not present

## 2019-11-13 ENCOUNTER — Ambulatory Visit (HOSPITAL_COMMUNITY)
Admission: EM | Admit: 2019-11-13 | Discharge: 2019-11-13 | Disposition: A | Discharge status: Home / Self Care | Payer: Medicare HMO | Attending: Family Medicine | Admitting: Family Medicine

## 2019-11-13 ENCOUNTER — Encounter (HOSPITAL_COMMUNITY): Payer: Self-pay

## 2019-11-13 DIAGNOSIS — E1149 Type 2 diabetes mellitus with other diabetic neurological complication: Secondary | ICD-10-CM | POA: Insufficient documentation

## 2019-11-13 DIAGNOSIS — Z8639 Personal history of other endocrine, nutritional and metabolic disease: Secondary | ICD-10-CM | POA: Diagnosis not present

## 2019-11-13 DIAGNOSIS — R600 Localized edema: Secondary | ICD-10-CM | POA: Diagnosis not present

## 2019-11-13 LAB — BASIC METABOLIC PANEL
Anion gap: 12 (ref 5–15)
BUN: 22 mg/dL (ref 8–23)
CO2: 24 mmol/L (ref 22–32)
Calcium: 9 mg/dL (ref 8.9–10.3)
Chloride: 103 mmol/L (ref 98–111)
Creatinine, Ser: 1.52 mg/dL — ABNORMAL HIGH (ref 0.61–1.24)
GFR calc Af Amer: 55 mL/min — ABNORMAL LOW (ref >60)
GFR calc non Af Amer: 47 mL/min — ABNORMAL LOW (ref >60)
Glucose, Bld: 116 mg/dL — ABNORMAL HIGH (ref 70–99)
Potassium: 4.6 mmol/L (ref 3.5–5.1)
Sodium: 139 mmol/L (ref 135–145)

## 2019-11-13 LAB — BRAIN NATRIURETIC PEPTIDE: B Natriuretic Peptide: 98.5 pg/mL (ref 0.0–100.0)

## 2019-11-13 MED ORDER — FUROSEMIDE 20 MG PO TABS: 10.0000 mg | ORAL_TABLET | Freq: Every day | ORAL | 0 refills | Status: DC

## 2019-11-13 MED ORDER — GABAPENTIN 300 MG PO CAPS: 300.0000 mg | ORAL_CAPSULE | Freq: Every day | ORAL | 0 refills | Status: DC

## 2019-11-13 NOTE — ED Triage Notes (Signed)
Patient reports bilateral ankle and foot burning sensations in the joints. Reports this has been happening for 3 weeks. Patient also had pitting edema on bilateral calves.

## 2019-11-13 NOTE — ED Notes (Signed)
Bed: UC05 Expected date: 11/13/19 Expected time: 3:43 PM Means of arrival:  Comments: Pt [name]

## 2019-11-13 NOTE — Discharge Instructions (Addendum)
Avoid salt Take the furosemide daily Wear the compression stockings Elevate feet Call your primary doctor for follow up You may need a change in medicine Take the gabapentin for nerve pain
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
## 2019-11-14 ENCOUNTER — Telehealth: Payer: Self-pay | Admitting: Family Medicine

## 2019-11-14 NOTE — Telephone Encounter (Signed)
Pt must contact [employer] and request that they fax [place] for transfers.

## 2019-11-14 NOTE — Telephone Encounter (Signed)
1) Medication(s) Requested (by name): Patient would like for all of his medications transferred to [facility]  2) Pharmacy of Choice: [place]   3) Special Requests:   Approved medications will be sent to the pharmacy, we will reach out if there is an issue.  Requests made after 3pm may not be addressed until the following business day!  If a patient is unsure of the name of the medication(s) please note and ask patient to call back when they are able to provide all info, do not send to responsible party until all information is available!

## 2019-11-23 ENCOUNTER — Ambulatory Visit (HOSPITAL_COMMUNITY)
Admission: RE | Admit: 2019-11-23 | Discharge: 2019-11-23 | Disposition: A | Discharge status: Home / Self Care | Payer: Medicare HMO | Source: Ambulatory Visit | Attending: Internal Medicine | Admitting: Internal Medicine

## 2019-11-23 ENCOUNTER — Inpatient Hospital Stay: Payer: Medicare HMO | Attending: Internal Medicine

## 2019-11-23 ENCOUNTER — Other Ambulatory Visit: Payer: Self-pay

## 2019-11-23 DIAGNOSIS — C3490 Malignant neoplasm of unspecified part of unspecified bronchus or lung: Secondary | ICD-10-CM | POA: Diagnosis not present

## 2019-11-23 DIAGNOSIS — E119 Type 2 diabetes mellitus without complications: Secondary | ICD-10-CM | POA: Insufficient documentation

## 2019-11-23 DIAGNOSIS — Z923 Personal history of irradiation: Secondary | ICD-10-CM | POA: Diagnosis not present

## 2019-11-23 DIAGNOSIS — Z9221 Personal history of antineoplastic chemotherapy: Secondary | ICD-10-CM | POA: Insufficient documentation

## 2019-11-23 DIAGNOSIS — C3431 Malignant neoplasm of lower lobe, right bronchus or lung: Secondary | ICD-10-CM | POA: Insufficient documentation

## 2019-11-23 DIAGNOSIS — E785 Hyperlipidemia, unspecified: Secondary | ICD-10-CM | POA: Insufficient documentation

## 2019-11-23 DIAGNOSIS — Z79899 Other long term (current) drug therapy: Secondary | ICD-10-CM | POA: Insufficient documentation

## 2019-11-23 LAB — CBC WITH DIFFERENTIAL (CANCER CENTER ONLY)
Abs Immature Granulocytes: 0 10*3/uL (ref 0.00–0.07)
Basophils Absolute: 0 10*3/uL (ref 0.0–0.1)
Basophils Relative: 0 %
Eosinophils Absolute: 0 10*3/uL (ref 0.0–0.5)
Eosinophils Relative: 1 %
HCT: 27.7 % — ABNORMAL LOW (ref 39.0–52.0)
Hemoglobin: 9.2 g/dL — ABNORMAL LOW (ref 13.0–17.0)
Immature Granulocytes: 0 %
Lymphocytes Relative: 25 %
Lymphs Abs: 0.8 10*3/uL (ref 0.7–4.0)
MCH: 32.4 pg (ref 26.0–34.0)
MCHC: 33.2 g/dL (ref 30.0–36.0)
MCV: 97.5 fL (ref 80.0–100.0)
Monocytes Absolute: 0.3 10*3/uL (ref 0.1–1.0)
Monocytes Relative: 9 %
Neutro Abs: 1.9 10*3/uL (ref 1.7–7.7)
Neutrophils Relative %: 65 %
Platelet Count: 80 10*3/uL — ABNORMAL LOW (ref 150–400)
RBC: 2.84 MIL/uL — ABNORMAL LOW (ref 4.22–5.81)
RDW: 16.1 % — ABNORMAL HIGH (ref 11.5–15.5)
WBC Count: 3 10*3/uL — ABNORMAL LOW (ref 4.0–10.5)
nRBC: 0 % (ref 0.0–0.2)

## 2019-11-23 LAB — CMP (CANCER CENTER ONLY)
ALT: 29 U/L (ref 0–44)
AST: 39 U/L (ref 15–41)
Albumin: 3 g/dL — ABNORMAL LOW (ref 3.5–5.0)
Alkaline Phosphatase: 158 U/L — ABNORMAL HIGH (ref 38–126)
Anion gap: 9 (ref 5–15)
BUN: 14 mg/dL (ref 8–23)
CO2: 27 mmol/L (ref 22–32)
Calcium: 8.8 mg/dL — ABNORMAL LOW (ref 8.9–10.3)
Chloride: 102 mmol/L (ref 98–111)
Creatinine: 1.46 mg/dL — ABNORMAL HIGH (ref 0.61–1.24)
GFR, Est AFR Am: 58 mL/min — ABNORMAL LOW (ref >60)
GFR, Estimated: 50 mL/min — ABNORMAL LOW (ref >60)
Glucose, Bld: 89 mg/dL (ref 70–99)
Potassium: 3.3 mmol/L — ABNORMAL LOW (ref 3.5–5.1)
Sodium: 138 mmol/L (ref 135–145)
Total Bilirubin: 1.7 mg/dL — ABNORMAL HIGH (ref 0.3–1.2)
Total Protein: 7.8 g/dL (ref 6.5–8.1)

## 2019-11-26 ENCOUNTER — Encounter: Payer: Self-pay | Admitting: Internal Medicine

## 2019-11-26 ENCOUNTER — Inpatient Hospital Stay: Payer: Medicare HMO | Admitting: Internal Medicine

## 2019-11-26 ENCOUNTER — Other Ambulatory Visit: Payer: Self-pay

## 2019-11-26 VITALS — BP 116/76 | HR 93 | Temp 98.4°F | Resp 17 | Ht 74.0 in | Wt 187.5 lb

## 2019-11-26 DIAGNOSIS — Z9221 Personal history of antineoplastic chemotherapy: Secondary | ICD-10-CM | POA: Diagnosis not present

## 2019-11-26 DIAGNOSIS — E785 Hyperlipidemia, unspecified: Secondary | ICD-10-CM | POA: Diagnosis not present

## 2019-11-26 DIAGNOSIS — E119 Type 2 diabetes mellitus without complications: Secondary | ICD-10-CM | POA: Diagnosis not present

## 2019-11-26 DIAGNOSIS — Z79899 Other long term (current) drug therapy: Secondary | ICD-10-CM | POA: Diagnosis not present

## 2019-11-26 DIAGNOSIS — Z923 Personal history of irradiation: Secondary | ICD-10-CM | POA: Diagnosis not present

## 2019-11-26 DIAGNOSIS — D61818 Other pancytopenia: Secondary | ICD-10-CM | POA: Diagnosis not present

## 2019-11-26 DIAGNOSIS — C3491 Malignant neoplasm of unspecified part of right bronchus or lung: Secondary | ICD-10-CM | POA: Diagnosis not present

## 2019-11-26 DIAGNOSIS — C3490 Malignant neoplasm of unspecified part of unspecified bronchus or lung: Secondary | ICD-10-CM | POA: Diagnosis not present

## 2019-11-26 DIAGNOSIS — C3431 Malignant neoplasm of lower lobe, right bronchus or lung: Secondary | ICD-10-CM | POA: Diagnosis not present
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
## 2019-11-28 ENCOUNTER — Telehealth: Payer: Self-pay | Admitting: Family Medicine

## 2019-11-28 NOTE — Telephone Encounter (Signed)
Patient called in to inform pcp that he recently saw his oncologist and was informed by him that his cholesterol medication dosage needed to be lowered due to his recent blood work. Please follow up at your earliest convenience.

## 2019-11-29 NOTE — Telephone Encounter (Signed)
Patient was called and informed to hold medication for 2 weeks then to restart at 1/2 tab a day. Patient was also informed to have oncologist to fax labs over to PCP.

## 2019-12-28 DIAGNOSIS — Z0000 Encounter for general adult medical examination without abnormal findings: Secondary | ICD-10-CM | POA: Diagnosis not present

## 2019-12-28 DIAGNOSIS — E7800 Pure hypercholesterolemia, unspecified: Secondary | ICD-10-CM | POA: Diagnosis not present

## 2019-12-28 DIAGNOSIS — I10 Essential (primary) hypertension: Secondary | ICD-10-CM | POA: Diagnosis not present

## 2019-12-28 DIAGNOSIS — Z1339 Encounter for screening examination for other mental health and behavioral disorders: Secondary | ICD-10-CM | POA: Diagnosis not present

## 2019-12-28 DIAGNOSIS — N183 Chronic kidney disease, stage 3 unspecified: Secondary | ICD-10-CM | POA: Diagnosis not present

## 2019-12-28 DIAGNOSIS — E119 Type 2 diabetes mellitus without complications: Secondary | ICD-10-CM | POA: Diagnosis not present

## 2019-12-31 ENCOUNTER — Other Ambulatory Visit: Payer: Self-pay | Admitting: Family Medicine

## 2019-12-31 DIAGNOSIS — E7800 Pure hypercholesterolemia, unspecified: Secondary | ICD-10-CM

## 2019-12-31 DIAGNOSIS — I10 Essential (primary) hypertension: Secondary | ICD-10-CM

## 2019-12-31 DIAGNOSIS — M1A9XX0 Chronic gout, unspecified, without tophus (tophi): Secondary | ICD-10-CM

## 2020-01-08 ENCOUNTER — Other Ambulatory Visit: Payer: Self-pay | Admitting: Family Medicine

## 2020-01-08 DIAGNOSIS — E7800 Pure hypercholesterolemia, unspecified: Secondary | ICD-10-CM

## 2020-01-08 NOTE — Telephone Encounter (Signed)
Medication Refill - Medication: atorvastatin (LIPITOR) 40 MG tablet (Patient stated that [facility]  needs prescription faxed to [phone] so that medication can be filled.)   Has the patient contacted their pharmacy?yes (Agent: If no, request that the patient contact the pharmacy for the refill.) (Agent: If yes, when and what did the pharmacy advise?)contact pcp  Preferred Pharmacy (with phone number or street name):  [facility] [place] Phone:  [phone]  Fax:  [phone]       Agent: Please be advised that RX refills may take up to 3 business days. We ask that you follow-up with your pharmacy.

## 2020-01-08 NOTE — Telephone Encounter (Signed)
Requested medication (s) are due for refill today -yes  Requested medication (s) are on the active medication list -yes  Future visit scheduled -no  Last refill: 07/16/19  Notes to clinic: Rx sent for review of RF request- denied last week, patient is requesting Rx sent to mail order pharmacy- sent for review of Sig- see note in chart- dosage decreased to half tablet/day due to lab results  Requested Prescriptions  Pending Prescriptions Disp Refills   atorvastatin (LIPITOR) 40 MG tablet 90 tablet 1    Sig: TAKE 1 TABLET BY MOUTH EVERY DAY AT 6PM      Cardiovascular:  Antilipid - Statins Failed - 01/08/2020 12:03 PM      Failed - Total Cholesterol in normal range and within 360 days    Cholesterol, Total  Date Value Ref Range Status  04/26/2018 151 100 - 199 mg/dL Final          Failed - LDL in normal range and within 360 days    LDL Calculated  Date Value Ref Range Status  04/26/2018 Comment 0 - 99 mg/dL Final    Comment:    Triglyceride result indicated is too high for an accurate LDL cholesterol estimation.           Failed - HDL in normal range and within 360 days    HDL  Date Value Ref Range Status  04/26/2018 40 >39 mg/dL Final          Failed - Triglycerides in normal range and within 360 days    Triglycerides  Date Value Ref Range Status  04/26/2018 780 (HH) 0 - 149 mg/dL Final          Passed - Patient is not pregnant      Passed - Valid encounter within last 12 months    Recent Outpatient Visits           5 months ago Type 2 diabetes mellitus with other specified complication, without long-term current use of insulin ([place])   [place], Caldwell, MD   11 months ago Type 2 diabetes mellitus with other specified complication, without long-term current use of insulin ([place])   [place], [place], MD   1 year ago Type 2 diabetes mellitus with other specified complication, without  long-term current use of insulin ([place])   [place], [place], MD   1 year ago Chronic gout without tophus, unspecified cause, unspecified site   [place], Charlane Ferretti, MD   2 years ago Chronic gout without tophus, unspecified cause, unspecified site   [place], Charlane Ferretti, MD                  Requested Prescriptions  Pending Prescriptions Disp Refills   atorvastatin (LIPITOR) 40 MG tablet 90 tablet 1    Sig: TAKE 1 TABLET BY MOUTH EVERY DAY AT 6PM      Cardiovascular:  Antilipid - Statins Failed - 01/08/2020 12:03 PM      Failed - Total Cholesterol in normal range and within 360 days    Cholesterol, Total  Date Value Ref Range Status  04/26/2018 151 100 - 199 mg/dL Final          Failed - LDL in normal range and within 360 days    LDL Calculated  Date Value Ref Range Status  04/26/2018 Comment 0 - 99 mg/dL  Final    Comment:    Triglyceride result indicated is too high for an accurate LDL cholesterol estimation.           Failed - HDL in normal range and within 360 days    HDL  Date Value Ref Range Status  04/26/2018 40 >39 mg/dL Final          Failed - Triglycerides in normal range and within 360 days    Triglycerides  Date Value Ref Range Status  04/26/2018 780 (HH) 0 - 149 mg/dL Final          Passed - Patient is not pregnant      Passed - Valid encounter within last 12 months    Recent Outpatient Visits           5 months ago Type 2 diabetes mellitus with other specified complication, without long-term current use of insulin ([place])   [place], Frazer, MD   11 months ago Type 2 diabetes mellitus with other specified complication, without long-term current use of insulin ([place])   [place], [place], MD   1 year ago Type 2 diabetes mellitus with other specified  complication, without long-term current use of insulin ([place])   [place], Charlane Ferretti, MD   1 year ago Chronic gout without tophus, unspecified cause, unspecified site   [place], Charlane Ferretti, MD   2 years ago Chronic gout without tophus, unspecified cause, unspecified site   [facility] [name], MD

## 2020-01-17 DIAGNOSIS — M79671 Pain in right foot: Secondary | ICD-10-CM | POA: Diagnosis not present

## 2020-01-17 DIAGNOSIS — M2012 Hallux valgus (acquired), left foot: Secondary | ICD-10-CM | POA: Diagnosis not present

## 2020-01-17 DIAGNOSIS — M79672 Pain in left foot: Secondary | ICD-10-CM | POA: Diagnosis not present

## 2020-01-17 DIAGNOSIS — M2011 Hallux valgus (acquired), right foot: Secondary | ICD-10-CM | POA: Diagnosis not present

## 2020-01-18 ENCOUNTER — Other Ambulatory Visit: Payer: Self-pay | Admitting: Family Medicine

## 2020-01-18 DIAGNOSIS — K219 Gastro-esophageal reflux disease without esophagitis: Secondary | ICD-10-CM

## 2020-02-13 DIAGNOSIS — K625 Hemorrhage of anus and rectum: Secondary | ICD-10-CM | POA: Diagnosis not present

## 2020-02-13 DIAGNOSIS — Z888 Allergy status to other drugs, medicaments and biological substances status: Secondary | ICD-10-CM | POA: Diagnosis not present

## 2020-02-14 ENCOUNTER — Other Ambulatory Visit: Payer: Self-pay | Admitting: Family Medicine

## 2020-02-14 DIAGNOSIS — M1A9XX0 Chronic gout, unspecified, without tophus (tophi): Secondary | ICD-10-CM

## 2020-02-14 DIAGNOSIS — I10 Essential (primary) hypertension: Secondary | ICD-10-CM

## 2020-02-14 NOTE — Telephone Encounter (Signed)
  Notes to clinic already given curtesy refill, no visit scheduled.

## 2020-02-16 ENCOUNTER — Emergency Department (HOSPITAL_COMMUNITY)
Admission: EM | Admit: 2020-02-16 | Discharge: 2020-02-16 | Disposition: A | Discharge status: Home / Self Care | Payer: Medicare HMO | Attending: Emergency Medicine | Admitting: Emergency Medicine

## 2020-02-16 ENCOUNTER — Other Ambulatory Visit: Payer: Self-pay

## 2020-02-16 DIAGNOSIS — E1140 Type 2 diabetes mellitus with diabetic neuropathy, unspecified: Secondary | ICD-10-CM | POA: Diagnosis not present

## 2020-02-16 DIAGNOSIS — Z85118 Personal history of other malignant neoplasm of bronchus and lung: Secondary | ICD-10-CM | POA: Diagnosis not present

## 2020-02-16 DIAGNOSIS — K625 Hemorrhage of anus and rectum: Secondary | ICD-10-CM | POA: Diagnosis not present

## 2020-02-16 DIAGNOSIS — I10 Essential (primary) hypertension: Secondary | ICD-10-CM | POA: Diagnosis not present

## 2020-02-16 DIAGNOSIS — R0902 Hypoxemia: Secondary | ICD-10-CM | POA: Diagnosis not present

## 2020-02-16 DIAGNOSIS — Z794 Long term (current) use of insulin: Secondary | ICD-10-CM | POA: Insufficient documentation

## 2020-02-16 DIAGNOSIS — Z79899 Other long term (current) drug therapy: Secondary | ICD-10-CM | POA: Diagnosis not present

## 2020-02-16 DIAGNOSIS — Z87891 Personal history of nicotine dependence: Secondary | ICD-10-CM | POA: Insufficient documentation

## 2020-02-16 DIAGNOSIS — R58 Hemorrhage, not elsewhere classified: Secondary | ICD-10-CM | POA: Diagnosis not present

## 2020-02-16 LAB — CBC
HCT: 29.3 % — ABNORMAL LOW (ref 39.0–52.0)
Hemoglobin: 9.5 g/dL — ABNORMAL LOW (ref 13.0–17.0)
MCH: 33.1 pg (ref 26.0–34.0)
MCHC: 32.4 g/dL (ref 30.0–36.0)
MCV: 102.1 fL — ABNORMAL HIGH (ref 80.0–100.0)
Platelets: 142 10*3/uL — ABNORMAL LOW (ref 150–400)
RBC: 2.87 MIL/uL — ABNORMAL LOW (ref 4.22–5.81)
RDW: 16.3 % — ABNORMAL HIGH (ref 11.5–15.5)
WBC: 4 10*3/uL (ref 4.0–10.5)
nRBC: 0 % (ref 0.0–0.2)

## 2020-02-16 LAB — COMPREHENSIVE METABOLIC PANEL
ALT: 21 U/L (ref 0–44)
AST: 37 U/L (ref 15–41)
Albumin: 2.8 g/dL — ABNORMAL LOW (ref 3.5–5.0)
Alkaline Phosphatase: 104 U/L (ref 38–126)
Anion gap: 10 (ref 5–15)
BUN: 35 mg/dL — ABNORMAL HIGH (ref 8–23)
CO2: 22 mmol/L (ref 22–32)
Calcium: 8.7 mg/dL — ABNORMAL LOW (ref 8.9–10.3)
Chloride: 105 mmol/L (ref 98–111)
Creatinine, Ser: 1.46 mg/dL — ABNORMAL HIGH (ref 0.61–1.24)
GFR calc Af Amer: 58 mL/min — ABNORMAL LOW (ref >60)
GFR calc non Af Amer: 50 mL/min — ABNORMAL LOW (ref >60)
Glucose, Bld: 79 mg/dL (ref 70–99)
Potassium: 3.7 mmol/L (ref 3.5–5.1)
Sodium: 137 mmol/L (ref 135–145)
Total Bilirubin: 0.7 mg/dL (ref 0.3–1.2)
Total Protein: 7.1 g/dL (ref 6.5–8.1)

## 2020-02-16 LAB — TYPE AND SCREEN
ABO/RH(D): B POS
Antibody Screen: NEGATIVE

## 2020-02-16 MED ORDER — HYDROCORTISONE ACETATE 25 MG RE SUPP
25.0000 mg | Freq: Two times a day (BID) | RECTAL | 0 refills | Status: DC
Start: 2020-02-16 — End: 2024-04-23

## 2020-02-16 NOTE — Discharge Instructions (Signed)
Use suppositories twice a day Please follow up with your doctor on Monday Return if you have worsening symptoms
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
## 2020-02-16 NOTE — ED Notes (Signed)
Pt name called for vitals, no response. Not in bathroom or outside

## 2020-02-16 NOTE — ED Triage Notes (Signed)
History of hemorrhoids. Pt said 3 days he has been bleeding and is bleeding a good amount. Pt said he needed to have surgery but has not been able to. No dark stools no abdominal pain

## 2020-02-19 ENCOUNTER — Other Ambulatory Visit: Payer: Self-pay | Admitting: Family Medicine

## 2020-02-19 DIAGNOSIS — R0981 Nasal congestion: Secondary | ICD-10-CM

## 2020-03-28 ENCOUNTER — Other Ambulatory Visit: Payer: Self-pay | Admitting: Family Medicine

## 2020-03-28 DIAGNOSIS — I10 Essential (primary) hypertension: Secondary | ICD-10-CM

## 2020-03-28 DIAGNOSIS — M1A9XX0 Chronic gout, unspecified, without tophus (tophi): Secondary | ICD-10-CM

## 2020-03-29 NOTE — Telephone Encounter (Signed)
Requested  medications are  due for refill today yes  Requested medications are on the active medication list yes  Last refill 8/14  Last visit 07/2019  Future visit scheduled 05/26/2020  Notes to clinic Has already had a curtesy refill, visit coming up 05/26/20, please assess

## 2020-04-01 ENCOUNTER — Other Ambulatory Visit: Payer: Self-pay | Admitting: Family Medicine

## 2020-04-01 NOTE — Telephone Encounter (Signed)
varenicline (CHANTIX CONTINUING MONTH PAK) 1 MG tablet Medication Date: 01/15/2019 Department: [place] Ordering/Authorizing: [name], MD   [place] [place]  [place] [address]  Phone: [phone] Fax: [phone]  Hours: Not open 24 hours   Wants Refill

## 2020-04-01 NOTE — Telephone Encounter (Signed)
Requested medication (s) are due for refill today -expired Rx  Requested medication (s) are on the active medication list -yes  Future visit scheduled -yes  Last refill: 01/15/19 #60 2RF  Notes to clinic: Patient requesting RF on expired Rx- ? Compliance with Rx- 6 month supply( patient using 1 daily)- not renewed  Requested Prescriptions  Pending Prescriptions Disp Refills   varenicline (CHANTIX CONTINUING MONTH PAK) 1 MG tablet      Sig: Take 1 tablet (1 mg total) by mouth daily.      Psychiatry:  Drug Dependence Therapy Passed - 04/01/2020 12:01 PM      Passed - Valid encounter within last 12 months    Recent Outpatient Visits           8 months ago Type 2 diabetes mellitus with other specified complication, without long-term current use of insulin ([place])   [place], [place], MD   1 year ago Type 2 diabetes mellitus with other specified complication, without long-term current use of insulin ([place])   [place], [place], MD   1 year ago Type 2 diabetes mellitus with other specified complication, without long-term current use of insulin ([place])   [place], Charlane Ferretti, MD   1 year ago Chronic gout without tophus, unspecified cause, unspecified site   [place], [place], MD   2 years ago Chronic gout without tophus, unspecified cause, unspecified site   [place], Enobong, MD       Future Appointments             In 1 month [name], MD [place]                Requested Prescriptions  Pending Prescriptions Disp Refills   varenicline (CHANTIX CONTINUING MONTH PAK) 1 MG tablet      Sig: Take 1 tablet (1 mg total) by mouth daily.      Psychiatry:  Drug Dependence Therapy Passed - 04/01/2020 12:01 PM      Passed - Valid encounter within last 12  months    Recent Outpatient Visits           8 months ago Type 2 diabetes mellitus with other specified complication, without long-term current use of insulin ([place])   [place], [place], MD   1 year ago Type 2 diabetes mellitus with other specified complication, without long-term current use of insulin ([place])   [place], [place], MD   1 year ago Type 2 diabetes mellitus with other specified complication, without long-term current use of insulin ([place])   [place], Charlane Ferretti, MD   1 year ago Chronic gout without tophus, unspecified cause, unspecified site   [place], Charlane Ferretti, MD   2 years ago Chronic gout without tophus, unspecified cause, unspecified site   [place], MD       Future Appointments             In 1 month [name], MD [place]

## 2020-04-04 ENCOUNTER — Telehealth: Payer: Self-pay | Admitting: Family Medicine

## 2020-04-04 MED ORDER — BUPROPION HCL ER (XL) 150 MG PO TB24: 150.0000 mg | ORAL_TABLET | Freq: Every day | ORAL | 3 refills | Status: DC

## 2020-04-04 MED ORDER — VARENICLINE TARTRATE 1 MG PO TABS: 1.0000 mg | ORAL_TABLET | Freq: Every day | ORAL | 2 refills | Status: DC

## 2020-04-04 NOTE — Telephone Encounter (Signed)
Please refill if indicated! 

## 2020-04-04 NOTE — Telephone Encounter (Signed)
Made pt aware

## 2020-04-04 NOTE — Telephone Encounter (Signed)
Switched to Bupropion

## 2020-04-04 NOTE — Telephone Encounter (Signed)
Lotisha calling from [employer] is calling regarding varenicline (CHANTIX CONTINUING MONTH PAK) 1 MG tablet [[id number]] the medication has been recalled. Wanting to know did want to speak with the patient regarding a different script or want to send something else in? Please advise CB- [phone]

## 2020-04-08 ENCOUNTER — Other Ambulatory Visit: Payer: Self-pay | Admitting: Family Medicine

## 2020-04-08 DIAGNOSIS — E1149 Type 2 diabetes mellitus with other diabetic neurological complication: Secondary | ICD-10-CM

## 2020-04-08 NOTE — Telephone Encounter (Signed)
Medication Refill - Medication: gabapentin (NEURONTIN) 300 MG capsule   furosemide (LASIX) 20 MG tablet   Has the patient contacted their pharmacy? Yes.   (Agent: If no, request that the patient contact the pharmacy for the refill.) (Agent: If yes, when and what did the pharmacy advise?)  Preferred Pharmacy (with phone number or street name):  [place], [address]  [place] [address]  Phone: [phone] Fax: [phone]     Agent: Please be advised that RX refills may take up to 3 business days. We ask that you follow-up with your pharmacy.

## 2020-04-08 NOTE — Telephone Encounter (Signed)
Requested medications are due for refill today?  Yes - prescribed by Dr. Meda Coffee, [facility], on May 11,2021.    Requested medications are on active medication list?  Yes  Last Refill:   Furosemide  # 10 with no refills   Gabapentin # 30 with no refills    Future visit scheduled?  Yes - in one month.    Notes to Clinic:  Patient was last seen by provider 8 months ago.  Patient seen in [facility] on 11/13/2019 by Dr. Meda Coffee.  These two medications were ordered for the patient by Dr. Meda Coffee with instructions for the patient to follow-up with his PCP.  Please review.

## 2020-04-14 MED ORDER — GABAPENTIN 300 MG PO CAPS: 300.0000 mg | ORAL_CAPSULE | Freq: Every day | ORAL | 0 refills | Status: DC

## 2020-04-14 MED ORDER — FUROSEMIDE 20 MG PO TABS: 10.0000 mg | ORAL_TABLET | Freq: Every day | ORAL | 0 refills | Status: DC

## 2020-04-16 DIAGNOSIS — H0102B Squamous blepharitis left eye, upper and lower eyelids: Secondary | ICD-10-CM | POA: Diagnosis not present

## 2020-04-16 DIAGNOSIS — E113292 Type 2 diabetes mellitus with mild nonproliferative diabetic retinopathy without macular edema, left eye: Secondary | ICD-10-CM | POA: Diagnosis not present

## 2020-04-16 DIAGNOSIS — H04123 Dry eye syndrome of bilateral lacrimal glands: Secondary | ICD-10-CM | POA: Diagnosis not present

## 2020-04-16 DIAGNOSIS — H40013 Open angle with borderline findings, low risk, bilateral: Secondary | ICD-10-CM | POA: Diagnosis not present

## 2020-04-16 DIAGNOSIS — H0102A Squamous blepharitis right eye, upper and lower eyelids: Secondary | ICD-10-CM | POA: Diagnosis not present

## 2020-04-16 DIAGNOSIS — H2512 Age-related nuclear cataract, left eye: Secondary | ICD-10-CM | POA: Diagnosis not present

## 2020-04-16 DIAGNOSIS — H2521 Age-related cataract, morgagnian type, right eye: Secondary | ICD-10-CM | POA: Diagnosis not present

## 2020-04-17 DIAGNOSIS — K625 Hemorrhage of anus and rectum: Secondary | ICD-10-CM | POA: Diagnosis not present

## 2020-04-17 DIAGNOSIS — K648 Other hemorrhoids: Secondary | ICD-10-CM | POA: Diagnosis not present

## 2020-05-09 ENCOUNTER — Other Ambulatory Visit: Payer: Self-pay | Admitting: Family Medicine

## 2020-05-09 DIAGNOSIS — I10 Essential (primary) hypertension: Secondary | ICD-10-CM

## 2020-05-09 DIAGNOSIS — M1A9XX0 Chronic gout, unspecified, without tophus (tophi): Secondary | ICD-10-CM

## 2020-05-09 NOTE — Telephone Encounter (Signed)
Future visit in 1 week  

## 2020-05-21 ENCOUNTER — Ambulatory Visit: Payer: Medicare HMO | Attending: Family Medicine | Admitting: Family Medicine

## 2020-05-21 ENCOUNTER — Encounter: Payer: Self-pay | Admitting: Family Medicine

## 2020-05-21 ENCOUNTER — Other Ambulatory Visit: Payer: Self-pay

## 2020-05-21 ENCOUNTER — Ambulatory Visit: Payer: Self-pay | Admitting: General Surgery

## 2020-05-21 VITALS — BP 111/69 | HR 75 | Ht 74.0 in | Wt 194.6 lb

## 2020-05-21 DIAGNOSIS — H918X3 Other specified hearing loss, bilateral: Secondary | ICD-10-CM

## 2020-05-21 DIAGNOSIS — M1A9XX0 Chronic gout, unspecified, without tophus (tophi): Secondary | ICD-10-CM

## 2020-05-21 DIAGNOSIS — K648 Other hemorrhoids: Secondary | ICD-10-CM | POA: Diagnosis not present

## 2020-05-21 DIAGNOSIS — C3491 Malignant neoplasm of unspecified part of right bronchus or lung: Secondary | ICD-10-CM

## 2020-05-21 DIAGNOSIS — R600 Localized edema: Secondary | ICD-10-CM | POA: Diagnosis not present

## 2020-05-21 DIAGNOSIS — I152 Hypertension secondary to endocrine disorders: Secondary | ICD-10-CM | POA: Diagnosis not present

## 2020-05-21 DIAGNOSIS — E1149 Type 2 diabetes mellitus with other diabetic neurological complication: Secondary | ICD-10-CM | POA: Diagnosis not present

## 2020-05-21 DIAGNOSIS — E1169 Type 2 diabetes mellitus with other specified complication: Secondary | ICD-10-CM

## 2020-05-21 DIAGNOSIS — E7800 Pure hypercholesterolemia, unspecified: Secondary | ICD-10-CM

## 2020-05-21 DIAGNOSIS — E1159 Type 2 diabetes mellitus with other circulatory complications: Secondary | ICD-10-CM

## 2020-05-21 LAB — POCT GLYCOSYLATED HEMOGLOBIN (HGB A1C): Hemoglobin A1C: 4.5 % (ref 4.0–5.6)

## 2020-05-21 MED ORDER — BUPROPION HCL ER (XL) 150 MG PO TB24: 150.0000 mg | ORAL_TABLET | Freq: Every day | ORAL | 1 refills | Status: DC

## 2020-05-21 MED ORDER — ATORVASTATIN CALCIUM 40 MG PO TABS: ORAL_TABLET | ORAL | 1 refills | Status: DC

## 2020-05-21 MED ORDER — GABAPENTIN 300 MG PO CAPS: 600.0000 mg | ORAL_CAPSULE | Freq: Every day | ORAL | 1 refills | Status: DC

## 2020-05-21 MED ORDER — ALLOPURINOL 300 MG PO TABS: ORAL_TABLET | ORAL | 1 refills | Status: DC

## 2020-05-21 MED ORDER — CARVEDILOL 3.125 MG PO TABS: 3.1250 mg | ORAL_TABLET | Freq: Two times a day (BID) | ORAL | 1 refills | Status: DC

## 2020-05-21 NOTE — Progress Notes (Signed)
Wants to get hearing checked.  Has had some swelling in legs.

## 2020-05-21 NOTE — H&P (Signed)
Aldona Lento Appointment: 05/21/2020 11:10 AM Location: [facility] Patient #: [id number] DOB: 30-Dec-1955 Single / Language: [place] / Race: Black or African American Male   History of Present Illness Laurell Josephs E. Janee Morn MD; 05/21/2020 11:22 AM) The patient is a 64 year old male who presents with hemorrhoids. I was asked to see Helyn App by Dr. Bosie Clos in regarding to internal hemorrhoids. He notices prolapse after he has a bowel movement. He has to push them back in. He has associated bleeding but not significant pain.   Allergies (Chanel Lonni Fix, CMA; 05/21/2020 11:08 AM) ACE Inhibitors  Allergies Reconciled   Medication History (Chanel Lonni Fix, CMA; 05/21/2020 11:10 AM) Allopurinol (300MG  Tablet, Oral) Active. Atorvastatin Calcium (40MG  Tablet, Oral) Active. Wellbutrin SR (150MG  Tablet ER 12HR, Oral) Active. Coreg (3.125MG  Tablet, Oral) Active. ZyrTEC (10MG  Tablet, Oral) Active. Flonase (50MCG/DOSE Inhaler, Nasal) Active. Lasix (20MG  Tablet, Oral) Active. Gabapentin (300MG  Capsule, Oral) Active. Mitigare (0.6MG  Capsule, Oral) Active. Omeprazole (20MG  Capsule DR, Oral) Active. Viagra (50MG  Tablet, Oral) Active. Medications Reconciled  Other Problems (Chanel Lonni Fix, CMA; 05/21/2020 11:07 AM) Diabetes Mellitus  Gastroesophageal Reflux Disease  High blood pressure     Review of Systems (Chanel Nolan CMA; 05/21/2020 11:07 AM) General Not Present- Appetite Loss, Chills, Fatigue, Fever, Night Sweats, Weight Gain and Weight Loss. Skin Not Present- Change in Wart/Mole, Dryness, Hives, Jaundice, New Lesions, Non-Healing Wounds, Rash and Ulcer.  Vitals (Chanel Nolan CMA; 05/21/2020 11:11 AM) 05/21/2020 11:11 AM Weight: 195.25 lb Height: 74in Body Surface Area: 2.15 m Body Mass Index: 25.07 kg/m  Temp.: 69F  Pulse: 108 (Regular)  BP: 122/74(Sitting, Left Arm, Standard)       Physical Exam ([place] Jr E. Janee Morn MD; 05/21/2020  11:22 AM) General Mental Status-Alert. General Appearance-Consistent with stated age. Hydration-Well hydrated. Voice-Normal.  Head and Neck Head-normocephalic, atraumatic with no lesions or palpable masses. Trachea-midline. Thyroid Gland Characteristics - normal size and consistency.  Eye Eyeball - Bilateral-Extraocular movements intact. Sclera/Conjunctiva - Bilateral-No scleral icterus.  Chest and Lung Exam Chest and lung exam reveals -quiet, even and easy respiratory effort with no use of accessory muscles and on auscultation, normal breath sounds, no adventitious sounds and normal vocal resonance. Inspection Chest Wall - Normal. Back - normal.  Cardiovascular Cardiovascular examination reveals -normal heart sounds, regular rate and rhythm with no murmurs and normal pedal pulses bilaterally.  Abdomen Inspection Inspection of the abdomen reveals - No Hernias. Palpation/Percussion Palpation and Percussion of the abdomen reveal - Soft, Non Tender, No Rebound tenderness, No Rigidity (guarding) and No hepatosplenomegaly. Auscultation Auscultation of the abdomen reveals - Bowel sounds normal.  Rectal Note: External anal exam is unremarkable, no evidence of infection, digital rectal exam reveals some likely internal hemorrhoids. Anoscopy was then performed showing 2 enlarged internal hemorrhoids but he could not tolerate the anoscopy for very long so I had to stop   Neurologic Neurologic evaluation reveals -alert and oriented x 3 with no impairment of recent or remote memory. Mental Status-Normal.  Musculoskeletal Global Assessment -Note: no gross deformities.  Normal Exam - Left-Upper Extremity Strength Normal and Lower Extremity Strength Normal. Normal Exam - Right-Upper Extremity Strength Normal and Lower Extremity Strength Normal.  Lymphatic Head & Neck  General Head & Neck Lymphatics: Bilateral - Description -  Normal. Axillary  General Axillary Region: Bilateral - Description - Normal. Tenderness - Non Tender. Femoral & Inguinal  Generalized Femoral & Inguinal Lymphatics: Bilateral - Description - No Generalized lymphadenopathy.    Assessment & Plan Laurell Josephs E. Janee Morn MD; 05/21/2020 11:24  AM) HEMORRHOIDS, INTERNAL, WITH BLEEDING (K64.8) Impression: Bleeding internal hemorrhoids. He could not tolerate any office procedures so I have offered internal hemorrhoidectomy as an outpatient surgical procedure. I discussed the procedure, risks, and benefits with him. He is agreeable. 

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
## 2020-05-22 DIAGNOSIS — H0102B Squamous blepharitis left eye, upper and lower eyelids: Secondary | ICD-10-CM | POA: Diagnosis not present

## 2020-05-22 DIAGNOSIS — H04123 Dry eye syndrome of bilateral lacrimal glands: Secondary | ICD-10-CM | POA: Diagnosis not present

## 2020-05-22 DIAGNOSIS — H40013 Open angle with borderline findings, low risk, bilateral: Secondary | ICD-10-CM | POA: Diagnosis not present

## 2020-05-22 DIAGNOSIS — E113292 Type 2 diabetes mellitus with mild nonproliferative diabetic retinopathy without macular edema, left eye: Secondary | ICD-10-CM | POA: Diagnosis not present

## 2020-05-22 DIAGNOSIS — H0102A Squamous blepharitis right eye, upper and lower eyelids: Secondary | ICD-10-CM | POA: Diagnosis not present

## 2020-05-22 DIAGNOSIS — H2512 Age-related nuclear cataract, left eye: Secondary | ICD-10-CM | POA: Diagnosis not present

## 2020-05-22 DIAGNOSIS — H2521 Age-related cataract, morgagnian type, right eye: Secondary | ICD-10-CM | POA: Diagnosis not present

## 2020-05-22 LAB — LIPID PANEL
Chol/HDL Ratio: 1.8 ratio (ref 0.0–5.0)
Cholesterol, Total: 98 mg/dL — ABNORMAL LOW (ref 100–199)
HDL: 54 mg/dL (ref >39)
LDL Chol Calc (NIH): 28 mg/dL (ref 0–99)
Triglycerides: 75 mg/dL (ref 0–149)
VLDL Cholesterol Cal: 16 mg/dL (ref 5–40)

## 2020-05-23 ENCOUNTER — Telehealth: Payer: Self-pay

## 2020-05-23 NOTE — Telephone Encounter (Signed)
Patient name and DOB has been verified Patient was informed of lab results. Patient had no questions.  

## 2020-05-23 NOTE — Telephone Encounter (Signed)
-----   Message from [name], MD sent at 05/22/2020  1:42 PM EST ----- Please inform the patient that labs are normal. Thank you.

## 2020-05-26 ENCOUNTER — Other Ambulatory Visit: Payer: Self-pay | Admitting: Medical Oncology

## 2020-05-26 ENCOUNTER — Inpatient Hospital Stay: Payer: Medicare HMO

## 2020-05-26 ENCOUNTER — Ambulatory Visit (HOSPITAL_COMMUNITY)
Admission: RE | Admit: 2020-05-26 | Discharge: 2020-05-26 | Disposition: A | Discharge status: Home / Self Care | Payer: Medicare HMO | Source: Ambulatory Visit | Attending: Internal Medicine | Admitting: Internal Medicine

## 2020-05-26 ENCOUNTER — Ambulatory Visit: Payer: Medicare HMO | Admitting: Audiologist

## 2020-05-26 ENCOUNTER — Other Ambulatory Visit: Payer: Self-pay

## 2020-05-26 ENCOUNTER — Encounter: Payer: Self-pay | Admitting: Medical Oncology

## 2020-05-26 ENCOUNTER — Inpatient Hospital Stay: Payer: Medicare HMO | Attending: Internal Medicine

## 2020-05-26 ENCOUNTER — Encounter (HOSPITAL_COMMUNITY): Payer: Self-pay

## 2020-05-26 DIAGNOSIS — C3491 Malignant neoplasm of unspecified part of right bronchus or lung: Secondary | ICD-10-CM

## 2020-05-26 DIAGNOSIS — D649 Anemia, unspecified: Secondary | ICD-10-CM

## 2020-05-26 DIAGNOSIS — I77810 Thoracic aortic ectasia: Secondary | ICD-10-CM | POA: Diagnosis not present

## 2020-05-26 DIAGNOSIS — J439 Emphysema, unspecified: Secondary | ICD-10-CM | POA: Diagnosis not present

## 2020-05-26 DIAGNOSIS — I10 Essential (primary) hypertension: Secondary | ICD-10-CM | POA: Diagnosis not present

## 2020-05-26 DIAGNOSIS — Z79899 Other long term (current) drug therapy: Secondary | ICD-10-CM | POA: Diagnosis not present

## 2020-05-26 DIAGNOSIS — C3490 Malignant neoplasm of unspecified part of unspecified bronchus or lung: Secondary | ICD-10-CM | POA: Insufficient documentation

## 2020-05-26 DIAGNOSIS — Z794 Long term (current) use of insulin: Secondary | ICD-10-CM | POA: Diagnosis not present

## 2020-05-26 DIAGNOSIS — E119 Type 2 diabetes mellitus without complications: Secondary | ICD-10-CM | POA: Insufficient documentation

## 2020-05-26 DIAGNOSIS — C3431 Malignant neoplasm of lower lobe, right bronchus or lung: Secondary | ICD-10-CM | POA: Insufficient documentation

## 2020-05-26 DIAGNOSIS — I898 Other specified noninfective disorders of lymphatic vessels and lymph nodes: Secondary | ICD-10-CM | POA: Diagnosis not present

## 2020-05-26 LAB — CMP (CANCER CENTER ONLY)
ALT: 15 U/L (ref 0–44)
AST: 41 U/L (ref 15–41)
Albumin: 3.3 g/dL — ABNORMAL LOW (ref 3.5–5.0)
Alkaline Phosphatase: 149 U/L — ABNORMAL HIGH (ref 38–126)
Anion gap: 10 (ref 5–15)
BUN: 21 mg/dL (ref 8–23)
CO2: 25 mmol/L (ref 22–32)
Calcium: 9 mg/dL (ref 8.9–10.3)
Chloride: 102 mmol/L (ref 98–111)
Creatinine: 1.38 mg/dL — ABNORMAL HIGH (ref 0.61–1.24)
GFR, Estimated: 57 mL/min — ABNORMAL LOW (ref >60)
Glucose, Bld: 109 mg/dL — ABNORMAL HIGH (ref 70–99)
Potassium: 3.6 mmol/L (ref 3.5–5.1)
Sodium: 137 mmol/L (ref 135–145)
Total Bilirubin: 0.7 mg/dL (ref 0.3–1.2)
Total Protein: 8.2 g/dL — ABNORMAL HIGH (ref 6.5–8.1)

## 2020-05-26 LAB — CBC WITH DIFFERENTIAL (CANCER CENTER ONLY)
Abs Immature Granulocytes: 0.01 10*3/uL (ref 0.00–0.07)
Basophils Absolute: 0 10*3/uL (ref 0.0–0.1)
Basophils Relative: 1 %
Eosinophils Absolute: 0 10*3/uL (ref 0.0–0.5)
Eosinophils Relative: 1 %
HCT: 24.6 % — ABNORMAL LOW (ref 39.0–52.0)
Hemoglobin: 7.7 g/dL — ABNORMAL LOW (ref 13.0–17.0)
Immature Granulocytes: 0 %
Lymphocytes Relative: 26 %
Lymphs Abs: 0.9 10*3/uL (ref 0.7–4.0)
MCH: 24.4 pg — ABNORMAL LOW (ref 26.0–34.0)
MCHC: 31.3 g/dL (ref 30.0–36.0)
MCV: 78.1 fL — ABNORMAL LOW (ref 80.0–100.0)
Monocytes Absolute: 0.5 10*3/uL (ref 0.1–1.0)
Monocytes Relative: 15 %
Neutro Abs: 2 10*3/uL (ref 1.7–7.7)
Neutrophils Relative %: 57 %
Platelet Count: 89 10*3/uL — ABNORMAL LOW (ref 150–400)
RBC: 3.15 MIL/uL — ABNORMAL LOW (ref 4.22–5.81)
RDW: 25.3 % — ABNORMAL HIGH (ref 11.5–15.5)
WBC Count: 3.4 10*3/uL — ABNORMAL LOW (ref 4.0–10.5)
nRBC: 0 % (ref 0.0–0.2)

## 2020-05-26 LAB — PREPARE RBC (CROSSMATCH)

## 2020-05-26 MED ORDER — ACETAMINOPHEN 325 MG PO TABS
ORAL_TABLET | ORAL | Status: AC
  Filled 2020-05-26: qty 2

## 2020-05-26 MED ORDER — DIPHENHYDRAMINE HCL 25 MG PO CAPS
ORAL_CAPSULE | ORAL | Status: AC
  Filled 2020-05-26: qty 1

## 2020-05-26 MED ORDER — ACETAMINOPHEN 325 MG PO TABS
650.0000 mg | ORAL_TABLET | Freq: Once | ORAL | Status: AC
  Administered 2020-05-26: 650 mg via ORAL

## 2020-05-26 MED ORDER — SODIUM CHLORIDE 0.9% IV SOLUTION
250.0000 mL | Freq: Once | INTRAVENOUS | Status: AC
  Administered 2020-05-26: 250 mL via INTRAVENOUS
  Filled 2020-05-26: qty 250

## 2020-05-26 MED ORDER — DIPHENHYDRAMINE HCL 25 MG PO CAPS
25.0000 mg | ORAL_CAPSULE | Freq: Once | ORAL | Status: AC
  Administered 2020-05-26: 25 mg via ORAL

## 2020-05-26 NOTE — Patient Instructions (Signed)

## 2020-05-27 LAB — BPAM RBC
Blood Product Expiration Date: 202112132359
Blood Product Expiration Date: 202112132359
ISSUE DATE / TIME: 202111221315
ISSUE DATE / TIME: 202111221315
Unit Type and Rh: 7300
Unit Type and Rh: 7300

## 2020-05-27 LAB — TYPE AND SCREEN
ABO/RH(D): B POS
Antibody Screen: NEGATIVE
Unit division: 0
Unit division: 0

## 2020-05-28 ENCOUNTER — Other Ambulatory Visit: Payer: Self-pay

## 2020-05-28 ENCOUNTER — Inpatient Hospital Stay (HOSPITAL_BASED_OUTPATIENT_CLINIC_OR_DEPARTMENT_OTHER): Payer: Medicare HMO | Admitting: Internal Medicine

## 2020-05-28 ENCOUNTER — Encounter: Payer: Self-pay | Admitting: Medical Oncology

## 2020-05-28 ENCOUNTER — Encounter: Payer: Self-pay | Admitting: Internal Medicine

## 2020-05-28 DIAGNOSIS — C3490 Malignant neoplasm of unspecified part of unspecified bronchus or lung: Secondary | ICD-10-CM | POA: Diagnosis not present

## 2020-05-28 DIAGNOSIS — E119 Type 2 diabetes mellitus without complications: Secondary | ICD-10-CM | POA: Diagnosis not present

## 2020-05-28 DIAGNOSIS — Z79899 Other long term (current) drug therapy: Secondary | ICD-10-CM | POA: Diagnosis not present

## 2020-05-28 DIAGNOSIS — Z794 Long term (current) use of insulin: Secondary | ICD-10-CM | POA: Diagnosis not present

## 2020-05-28 DIAGNOSIS — C3431 Malignant neoplasm of lower lobe, right bronchus or lung: Secondary | ICD-10-CM | POA: Diagnosis not present

## 2020-05-28 DIAGNOSIS — D649 Anemia, unspecified: Secondary | ICD-10-CM | POA: Diagnosis not present

## 2020-05-28 DIAGNOSIS — I10 Essential (primary) hypertension: Secondary | ICD-10-CM | POA: Diagnosis not present
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
## 2020-06-02 ENCOUNTER — Telehealth: Payer: Self-pay | Admitting: Internal Medicine

## 2020-06-02 NOTE — Telephone Encounter (Signed)
Scheduled per los. Called and left msg. Mailed printout  °

## 2020-06-04 ENCOUNTER — Ambulatory Visit: Payer: Medicare HMO | Admitting: Audiologist

## 2020-06-09 ENCOUNTER — Ambulatory Visit: Payer: Medicare HMO | Attending: Family Medicine | Admitting: Audiologist

## 2020-06-09 ENCOUNTER — Other Ambulatory Visit: Payer: Self-pay

## 2020-06-09 DIAGNOSIS — H90A22 Sensorineural hearing loss, unilateral, left ear, with restricted hearing on the contralateral side: Secondary | ICD-10-CM | POA: Diagnosis not present

## 2020-06-09 DIAGNOSIS — H90A31 Mixed conductive and sensorineural hearing loss, unilateral, right ear with restricted hearing on the contralateral side: Secondary | ICD-10-CM | POA: Insufficient documentation

## 2020-06-09 NOTE — Procedures (Signed)
[facility] [place], [place]  [id number] [phone]  AUDIOLOGICAL  EVALUATION  NAME: [name]     DOB:   [birth year]      MRN: [id number]                                                                                     DATE: 06/09/2020     REFERENT: [name], MD STATUS: Outpatient DIAGNOSIS: Mixed Hearing [place] Right Ear, Sensorineural Hearing Loss Left Ear   History: [name] was seen for an audiological evaluation.  [name] is receiving a hearing evaluation due to concerns for pressure, itching, and hearing loss in the ears. [name] has difficulty hearing in background noise, crowds, and when people are at a distance. This difficulty began gradually. Ocassional pressure reported in both ears, but no pain. Pressure comes and goes. Itching present in both ears. [name] is using Debrox daily.  [name] has recently lost a lot of weight due to gum pain from his dentures. Tinnitus denied in both ears. [name] has a history of noise exposure from . Medical history positive for chemotherapy which is a risk factor for hearing loss. He says he is no longer receiving chemotherapy for stage III squamous cell carcinoma of right lung, and just is being monitored every six months. No other relevant case history reported.   Evaluation:   Otoscopy showed a clear view of the tympanic membranes with dry red skin in the auditory canal with no cerumen, bilaterally  Tympanometry results were consistent with normal middle ear function, bilaterally    Audiometric testing was completed using conventional audiometry with insert transducer. Speech Recognition Thresholds were consistent with pure tone averages. Word Recognition was good (80-88%) at an elevated level. Pure tone thresholds show normal to moderately severe cookie bite sensorineural hearing loss the left ear and a normal to moderately severe cookie bite mixed hearing loss in the right  ear. Conductive component at 500 and 1k Hz in the right ear only.   Results:  The test results were reviewed with [name]. The nature and degree of his hearing loss was explained. He was advised to stop using Debrox immediately and start lubricating ears with baby oil. One small drop for each ear, as ear canals are dry red and there is no wax. [name] is also a candidate for hearing aids, however he needs to see an ENT Physician due to the conductive component to his right ear hearing loss. The pressure in his ears could be due to the weight loss causing eustachian tube dysfunction, but he needs to rule out a more serious medical cause before pursuing hearing aids. [name] reported understanding all that was discussed today. He was given two copies of his audiogram to take with him to the ENT Physician.    Recommendations:  1. Amplification is necessary for both ears. Hearing aids can be purchased from a variety of locations. See provided list for locations in the [place] area.  2. Referral to ENT Physician necessary for medical clearance due to conductive component to right ear hearing loss in low frequencies.    Alfonse Alpers  Audiologist, Au.D., CCC-A 06/09/2020  9:08 AM  Cc: [name], MD

## 2020-06-21 ENCOUNTER — Encounter (HOSPITAL_COMMUNITY): Payer: Self-pay | Admitting: Emergency Medicine

## 2020-06-21 ENCOUNTER — Emergency Department (HOSPITAL_COMMUNITY): Payer: Medicare HMO

## 2020-06-21 ENCOUNTER — Emergency Department (HOSPITAL_COMMUNITY)
Admission: EM | Admit: 2020-06-21 | Discharge: 2020-06-21 | Disposition: A | Discharge status: Home / Self Care | Payer: Medicare HMO | Attending: Emergency Medicine | Admitting: Emergency Medicine

## 2020-06-21 ENCOUNTER — Other Ambulatory Visit: Payer: Self-pay

## 2020-06-21 DIAGNOSIS — W182XXA Fall in (into) shower or empty bathtub, initial encounter: Secondary | ICD-10-CM | POA: Diagnosis not present

## 2020-06-21 DIAGNOSIS — Z794 Long term (current) use of insulin: Secondary | ICD-10-CM | POA: Insufficient documentation

## 2020-06-21 DIAGNOSIS — R0782 Intercostal pain: Secondary | ICD-10-CM | POA: Diagnosis not present

## 2020-06-21 DIAGNOSIS — R55 Syncope and collapse: Secondary | ICD-10-CM | POA: Insufficient documentation

## 2020-06-21 DIAGNOSIS — J3489 Other specified disorders of nose and nasal sinuses: Secondary | ICD-10-CM | POA: Diagnosis not present

## 2020-06-21 DIAGNOSIS — Z79899 Other long term (current) drug therapy: Secondary | ICD-10-CM | POA: Diagnosis not present

## 2020-06-21 DIAGNOSIS — R0781 Pleurodynia: Secondary | ICD-10-CM

## 2020-06-21 DIAGNOSIS — W010XXA Fall on same level from slipping, tripping and stumbling without subsequent striking against object, initial encounter: Secondary | ICD-10-CM | POA: Insufficient documentation

## 2020-06-21 DIAGNOSIS — I10 Essential (primary) hypertension: Secondary | ICD-10-CM | POA: Insufficient documentation

## 2020-06-21 DIAGNOSIS — F10929 Alcohol use, unspecified with intoxication, unspecified: Secondary | ICD-10-CM | POA: Diagnosis not present

## 2020-06-21 DIAGNOSIS — F10129 Alcohol abuse with intoxication, unspecified: Secondary | ICD-10-CM | POA: Diagnosis not present

## 2020-06-21 DIAGNOSIS — F10920 Alcohol use, unspecified with intoxication, uncomplicated: Secondary | ICD-10-CM

## 2020-06-21 DIAGNOSIS — Z85118 Personal history of other malignant neoplasm of bronchus and lung: Secondary | ICD-10-CM | POA: Insufficient documentation

## 2020-06-21 DIAGNOSIS — R109 Unspecified abdominal pain: Secondary | ICD-10-CM | POA: Diagnosis not present

## 2020-06-21 DIAGNOSIS — E119 Type 2 diabetes mellitus without complications: Secondary | ICD-10-CM | POA: Diagnosis not present

## 2020-06-21 DIAGNOSIS — F10220 Alcohol dependence with intoxication, uncomplicated: Secondary | ICD-10-CM | POA: Insufficient documentation

## 2020-06-21 DIAGNOSIS — R519 Headache, unspecified: Secondary | ICD-10-CM | POA: Diagnosis not present

## 2020-06-21 DIAGNOSIS — M542 Cervicalgia: Secondary | ICD-10-CM | POA: Diagnosis not present

## 2020-06-21 DIAGNOSIS — J320 Chronic maxillary sinusitis: Secondary | ICD-10-CM | POA: Diagnosis not present

## 2020-06-21 DIAGNOSIS — Z87891 Personal history of nicotine dependence: Secondary | ICD-10-CM | POA: Insufficient documentation

## 2020-06-21 DIAGNOSIS — W19XXXA Unspecified fall, initial encounter: Secondary | ICD-10-CM | POA: Diagnosis not present

## 2020-06-21 DIAGNOSIS — R52 Pain, unspecified: Secondary | ICD-10-CM | POA: Diagnosis not present

## 2020-06-21 LAB — BASIC METABOLIC PANEL
Anion gap: 13 (ref 5–15)
BUN: 24 mg/dL — ABNORMAL HIGH (ref 8–23)
CO2: 22 mmol/L (ref 22–32)
Calcium: 8.9 mg/dL (ref 8.9–10.3)
Chloride: 100 mmol/L (ref 98–111)
Creatinine, Ser: 1.24 mg/dL (ref 0.61–1.24)
GFR, Estimated: >60 mL/min (ref >60)
Glucose, Bld: 77 mg/dL (ref 70–99)
Potassium: 3.7 mmol/L (ref 3.5–5.1)
Sodium: 135 mmol/L (ref 135–145)

## 2020-06-21 LAB — URINALYSIS, ROUTINE W REFLEX MICROSCOPIC
Bilirubin Urine: NEGATIVE
Glucose, UA: NEGATIVE mg/dL
Hgb urine dipstick: NEGATIVE
Ketones, ur: NEGATIVE mg/dL
Leukocytes,Ua: NEGATIVE
Nitrite: NEGATIVE
Protein, ur: NEGATIVE mg/dL
Specific Gravity, Urine: 1.003 — ABNORMAL LOW (ref 1.005–1.030)
pH: 6 (ref 5.0–8.0)

## 2020-06-21 LAB — CBC WITH DIFFERENTIAL/PLATELET
Abs Immature Granulocytes: 0.02 10*3/uL (ref 0.00–0.07)
Basophils Absolute: 0.1 10*3/uL (ref 0.0–0.1)
Basophils Relative: 1 %
Eosinophils Absolute: 0.1 10*3/uL (ref 0.0–0.5)
Eosinophils Relative: 1 %
HCT: 33.9 % — ABNORMAL LOW (ref 39.0–52.0)
Hemoglobin: 10.8 g/dL — ABNORMAL LOW (ref 13.0–17.0)
Immature Granulocytes: 0 %
Lymphocytes Relative: 29 %
Lymphs Abs: 1.4 10*3/uL (ref 0.7–4.0)
MCH: 26.8 pg (ref 26.0–34.0)
MCHC: 31.9 g/dL (ref 30.0–36.0)
MCV: 84.1 fL (ref 80.0–100.0)
Monocytes Absolute: 0.2 10*3/uL (ref 0.1–1.0)
Monocytes Relative: 4 %
Neutro Abs: 3.1 10*3/uL (ref 1.7–7.7)
Neutrophils Relative %: 65 %
Platelets: 169 10*3/uL (ref 150–400)
RBC: 4.03 MIL/uL — ABNORMAL LOW (ref 4.22–5.81)
RDW: 24.7 % — ABNORMAL HIGH (ref 11.5–15.5)
WBC: 4.9 10*3/uL (ref 4.0–10.5)
nRBC: 0 % (ref 0.0–0.2)

## 2020-06-21 LAB — ETHANOL: Alcohol, Ethyl (B): 320 mg/dL (ref <10)

## 2020-06-21 MED ORDER — LIDOCAINE 5 % EX PTCH
1.0000 | MEDICATED_PATCH | Freq: Once | CUTANEOUS | Status: DC
  Administered 2020-06-21: 21:00:00 1 via TRANSDERMAL
  Filled 2020-06-21: qty 1
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
## 2020-06-21 NOTE — ED Notes (Signed)
Patient ambulatory to restroom without assistance. 

## 2020-06-21 NOTE — Discharge Instructions (Addendum)
Please follow up with your PCP regarding your ED visit today I would recommend Ibuprofen and Tylenol as needed for pain however do not drink alcohol with these medications  Return to the ED for any worsening symptoms

## 2020-06-21 NOTE — ED Triage Notes (Signed)
Pt presents via EMS after falling at home. C/O R flank pain from hitting bath tub. Denies hitting head. C collar in place. Alcohol intoxication. Alert and oriented.

## 2020-07-07 NOTE — Pre-Procedure Instructions (Signed)
[name] [name], [place] [address] Phone: [phone] Fax: [phone]  [facility] [place], [place] [address] Phone: [phone] Fax: [phone]  [facility] [address] [place] [address] [place] [address] Phone: [phone] Fax: [phone]       Your procedure is scheduled on Friday January 7th.  Report to [facility] Main Entrance "A" at 5:30 A.M., and check in at the Admitting office.  Call this number if you have problems the morning of surgery:  [phone]  Call [phone] if you have any questions prior to your surgery date Monday-Friday 8am-4pm    Remember:  Do not eat after midnight the night before your surgery  You may drink clear liquids until 4:30 the morning of your surgery.   Clear liquids allowed are: Water, Non-Citrus Juices (without pulp), Carbonated Beverages, Clear Tea, Black Coffee Only, and Gatorade    Take these medicines the morning of surgery with A SIP OF WATER  acetaminophen (TYLENOL) allopurinol (ZYLOPRIM) Apremilast (OTEZLA) buPROPion (WELLBUTRIN XL) carvedilol (COREG)  cetirizine (ZYRTEC)  fluticasone (FLONASE) hydroxypropyl methylcellulose / hypromellose (ISOPTO TEARS / GONIOVISC)  Eye drops omeprazole (PRILOSEC)  As of today, STOP taking any Aspirin (unless otherwise instructed by your surgeon) Aleve, Naproxen, Ibuprofen, Motrin, Advil, Goody's, BC's, all herbal medications, fish oil, and all vitamins.                      Do not wear jewelry, make up, or nail polish            Do not wear lotions, powders, perfumes/colognes, or deodorant.            Do not shave 48 hours prior to surgery.  Men may shave face and neck.            Do not bring valuables to the hospital.            [facility] is not responsible for any belongings or valuables.  Do NOT Smoke (Tobacco/Vaping) or drink Alcohol 24 hours prior to your procedure If you use a CPAP at night, you may bring all equipment for your overnight stay.   Contacts, glasses, dentures or bridgework may not be worn into surgery.      For patients admitted to the hospital, discharge time will be determined by your treatment team.   Patients discharged the day of surgery will not be allowed to drive home, and someone needs to stay with them for 24 hours.    Special instructions- Bowel Prep  The night before your surgery- use one Fleets Enema (available over the counter)  The morning of your surgery- use one Fleets Enema (prior to arrival to hospital for surgery) Please contact Dr. Biagio Borg office with any questions regarding the bowel prep/Enema use -[place]- Preparing For Surgery  Before surgery, you can play an important role. Because skin is not sterile, your skin needs to be as free of germs as possible. You can reduce the number of germs on your skin by washing with CHG (chlorahexidine gluconate) Soap before surgery.  CHG is an antiseptic cleaner which kills germs and bonds with the skin to continue killing germs even after washing.    Oral Hygiene is also important  to reduce your risk of infection.  Remember - BRUSH YOUR TEETH THE MORNING OF SURGERY WITH YOUR REGULAR TOOTHPASTE  Please do not use if you have an allergy to CHG or antibacterial soaps. If your skin becomes reddened/irritated stop using the CHG.  Do not shave (including legs and underarms) for at least 48 hours prior to first CHG shower. It is OK to shave your face.  Please follow these instructions carefully.   1. Shower the NIGHT BEFORE SURGERY and the MORNING OF SURGERY with CHG Soap.   2. If you chose to wash your hair, wash your hair first as usual with your normal shampoo.  3. After you shampoo, rinse your hair and body  thoroughly to remove the shampoo.  4. Use CHG as you would any other liquid soap. You can apply CHG directly to the skin and wash gently with a scrungie or a clean washcloth.   5. Apply the CHG Soap to your body ONLY FROM THE NECK DOWN.  Do not use on open wounds or open sores. Avoid contact with your eyes, ears, mouth and genitals (private parts). Wash Face and genitals (private parts)  with your normal soap.   6. Wash thoroughly, paying special attention to the area where your surgery will be performed.  7. Thoroughly rinse your body with warm water from the neck down.  8. DO NOT shower/wash with your normal soap after using and rinsing off the CHG Soap.  9. Pat yourself dry with a CLEAN TOWEL.  10. Wear CLEAN PAJAMAS to bed the night before surgery  11. Place CLEAN SHEETS on your bed the night of your first shower and DO NOT SLEEP WITH PETS.   Day of Surgery: Wear Clean/Comfortable clothing the morning of surgery Do not apply any deodorants/lotions.   Remember to brush your teeth WITH YOUR REGULAR TOOTHPASTE.   Please read over the following fact sheets that you were given.

## 2020-07-08 ENCOUNTER — Encounter (HOSPITAL_COMMUNITY)
Admission: RE | Admit: 2020-07-08 | Discharge: 2020-07-08 | Disposition: A | Discharge status: Home / Self Care | Payer: Medicare HMO | Source: Ambulatory Visit | Attending: General Surgery | Admitting: General Surgery

## 2020-07-08 ENCOUNTER — Other Ambulatory Visit (HOSPITAL_COMMUNITY)
Admission: RE | Admit: 2020-07-08 | Discharge: 2020-07-08 | Disposition: A | Discharge status: Home / Self Care | Payer: Medicare HMO | Source: Ambulatory Visit | Attending: General Surgery | Admitting: General Surgery

## 2020-07-08 DIAGNOSIS — Z20822 Contact with and (suspected) exposure to covid-19: Secondary | ICD-10-CM | POA: Diagnosis not present

## 2020-07-08 DIAGNOSIS — Z01812 Encounter for preprocedural laboratory examination: Secondary | ICD-10-CM | POA: Insufficient documentation

## 2020-07-08 LAB — SARS CORONAVIRUS 2 (TAT 6-24 HRS): SARS Coronavirus 2: NEGATIVE

## 2020-07-10 ENCOUNTER — Encounter (HOSPITAL_COMMUNITY): Payer: Self-pay | Admitting: General Surgery

## 2020-07-10 NOTE — Anesthesia Preprocedure Evaluation (Addendum)
Anesthesia Evaluation  Patient identified by MRN, date of birth, ID band Patient awake    Reviewed: Allergy & Precautions, NPO status , Patient's Chart, lab work & pertinent test results, reviewed documented beta blocker date and time   Airway Mallampati: II  TM Distance: >3 FB Neck ROM: Full    Dental  (+) Edentulous Upper   Pulmonary former smoker,  Squamous cell Ca Right lower lobe   Pulmonary exam normal breath sounds clear to auscultation       Cardiovascular hypertension, Pt. on medications and Pt. on home beta blockers Normal cardiovascular exam Rhythm:Regular Rate:Normal  EKG 06/24/20 NSR, poor R wave progression   Neuro/Psych PSYCHIATRIC DISORDERS Diabetic neuropathy    GI/Hepatic GERD  Medicated and Controlled,(+)     substance abuse  alcohol use,   Endo/Other  diabetes, Well Controlled, Type 2, Insulin DependentHyperlipidemia  Renal/GU negative Renal ROS  negative genitourinary   Musculoskeletal  (+) Arthritis , Psoriasis   Abdominal   Peds  Hematology negative hematology ROS (+)   Anesthesia Other Findings   Reproductive/Obstetrics                            Anesthesia Physical Anesthesia Plan  ASA: III  Anesthesia Plan: General   Post-op Pain Management:    Induction: Intravenous  PONV Risk Score and Plan: 4 or greater and Treatment may vary due to age or medical condition and Ondansetron  Airway Management Planned: LMA and Oral ETT  Additional Equipment:   Intra-op Plan:   Post-operative Plan: Extubation in OR  Informed Consent: I have reviewed the patients History and Physical, chart, labs and discussed the procedure including the risks, benefits and alternatives for the proposed anesthesia with the patient or authorized representative who has indicated his/her understanding and acceptance.     Dental advisory given  Plan Discussed with: CRNA and  Anesthesiologist  Anesthesia Plan Comments:        Anesthesia Quick Evaluation

## 2020-07-11 ENCOUNTER — Ambulatory Visit (HOSPITAL_COMMUNITY): Payer: Medicare HMO | Admitting: Certified Registered"

## 2020-07-11 ENCOUNTER — Ambulatory Visit (HOSPITAL_COMMUNITY)
Admission: RE | Admit: 2020-07-11 | Discharge: 2020-07-11 | Disposition: A | Discharge status: Home / Self Care | Payer: Medicare HMO | Attending: General Surgery | Admitting: General Surgery

## 2020-07-11 ENCOUNTER — Other Ambulatory Visit: Payer: Self-pay

## 2020-07-11 ENCOUNTER — Encounter (HOSPITAL_COMMUNITY): Payer: Self-pay | Admitting: General Surgery

## 2020-07-11 ENCOUNTER — Encounter (HOSPITAL_COMMUNITY)
Admission: RE | Disposition: A | Discharge status: Home / Self Care | Payer: Self-pay | Source: Home / Self Care | Attending: General Surgery

## 2020-07-11 DIAGNOSIS — Z85118 Personal history of other malignant neoplasm of bronchus and lung: Secondary | ICD-10-CM | POA: Diagnosis not present

## 2020-07-11 DIAGNOSIS — Z794 Long term (current) use of insulin: Secondary | ICD-10-CM | POA: Insufficient documentation

## 2020-07-11 DIAGNOSIS — Z79899 Other long term (current) drug therapy: Secondary | ICD-10-CM | POA: Insufficient documentation

## 2020-07-11 DIAGNOSIS — K648 Other hemorrhoids: Secondary | ICD-10-CM | POA: Diagnosis not present

## 2020-07-11 DIAGNOSIS — E119 Type 2 diabetes mellitus without complications: Secondary | ICD-10-CM | POA: Diagnosis not present

## 2020-07-11 DIAGNOSIS — I10 Essential (primary) hypertension: Secondary | ICD-10-CM | POA: Diagnosis not present

## 2020-07-11 DIAGNOSIS — Z809 Family history of malignant neoplasm, unspecified: Secondary | ICD-10-CM | POA: Insufficient documentation

## 2020-07-11 DIAGNOSIS — Z87891 Personal history of nicotine dependence: Secondary | ICD-10-CM | POA: Diagnosis not present

## 2020-07-11 HISTORY — PX: HEMORRHOID SURGERY: SHX153

## 2020-07-11 LAB — CBC
HCT: 30.5 % — ABNORMAL LOW (ref 39.0–52.0)
Hemoglobin: 9.8 g/dL — ABNORMAL LOW (ref 13.0–17.0)
MCH: 30.9 pg (ref 26.0–34.0)
MCHC: 32.1 g/dL (ref 30.0–36.0)
MCV: 96.2 fL (ref 80.0–100.0)
Platelets: 67 10*3/uL — ABNORMAL LOW (ref 150–400)
RBC: 3.17 MIL/uL — ABNORMAL LOW (ref 4.22–5.81)
RDW: 26.4 % — ABNORMAL HIGH (ref 11.5–15.5)
WBC: 3.5 10*3/uL — ABNORMAL LOW (ref 4.0–10.5)
nRBC: 0 % (ref 0.0–0.2)

## 2020-07-11 LAB — SURGICAL PCR SCREEN
MRSA, PCR: NEGATIVE
Staphylococcus aureus: NEGATIVE

## 2020-07-11 LAB — BASIC METABOLIC PANEL
Anion gap: 13 (ref 5–15)
BUN: 11 mg/dL (ref 8–23)
CO2: 23 mmol/L (ref 22–32)
Calcium: 8.7 mg/dL — ABNORMAL LOW (ref 8.9–10.3)
Chloride: 101 mmol/L (ref 98–111)
Creatinine, Ser: 1.33 mg/dL — ABNORMAL HIGH (ref 0.61–1.24)
GFR, Estimated: 59 mL/min — ABNORMAL LOW (ref >60)
Glucose, Bld: 121 mg/dL — ABNORMAL HIGH (ref 70–99)
Potassium: 3.4 mmol/L — ABNORMAL LOW (ref 3.5–5.1)
Sodium: 137 mmol/L (ref 135–145)

## 2020-07-11 LAB — GLUCOSE, CAPILLARY
Glucose-Capillary: 110 mg/dL — ABNORMAL HIGH (ref 70–99)
Glucose-Capillary: 94 mg/dL (ref 70–99)

## 2020-07-11 SURGERY — HEMORRHOIDECTOMY
Anesthesia: General

## 2020-07-11 MED ORDER — MIDAZOLAM HCL 2 MG/2ML IJ SOLN
INTRAMUSCULAR | Status: AC
  Filled 2020-07-11: qty 2

## 2020-07-11 MED ORDER — SUGAMMADEX SODIUM 200 MG/2ML IV SOLN
INTRAVENOUS | Status: DC | PRN
  Administered 2020-07-11: 300 mg via INTRAVENOUS

## 2020-07-11 MED ORDER — THROMBIN 5000 UNITS EX SOLR
CUTANEOUS | Status: AC
  Filled 2020-07-11: qty 5000

## 2020-07-11 MED ORDER — PHENYLEPHRINE 40 MCG/ML (10ML) SYRINGE FOR IV PUSH (FOR BLOOD PRESSURE SUPPORT)
PREFILLED_SYRINGE | INTRAVENOUS | Status: DC | PRN
  Administered 2020-07-11: 60 ug via INTRAVENOUS

## 2020-07-11 MED ORDER — OXYCODONE HCL 5 MG/5ML PO SOLN
5.0000 mg | Freq: Once | ORAL | Status: DC | PRN
Start: 2020-07-11 — End: 2020-07-11

## 2020-07-11 MED ORDER — THROMBIN (RECOMBINANT) 20000 UNITS EX SOLR
CUTANEOUS | Status: AC
  Filled 2020-07-11: qty 20000

## 2020-07-11 MED ORDER — CHLORHEXIDINE GLUCONATE 0.12 % MT SOLN
15.0000 mL | Freq: Once | OROMUCOSAL | Status: AC
  Administered 2020-07-11: 15 mL via OROMUCOSAL
  Filled 2020-07-11: qty 15

## 2020-07-11 MED ORDER — ROCURONIUM BROMIDE 10 MG/ML (PF) SYRINGE
PREFILLED_SYRINGE | INTRAVENOUS | Status: DC | PRN
  Administered 2020-07-11: 50 mg via INTRAVENOUS
  Administered 2020-07-11: 20 mg via INTRAVENOUS

## 2020-07-11 MED ORDER — FENTANYL CITRATE (PF) 250 MCG/5ML IJ SOLN
INTRAMUSCULAR | Status: AC
  Filled 2020-07-11: qty 5

## 2020-07-11 MED ORDER — BUPIVACAINE LIPOSOME 1.3 % IJ SUSP
INTRAMUSCULAR | Status: DC | PRN
  Administered 2020-07-11: 20 mL

## 2020-07-11 MED ORDER — LACTATED RINGERS IV SOLN: INTRAVENOUS | Status: DC

## 2020-07-11 MED ORDER — 0.9 % SODIUM CHLORIDE (POUR BTL) OPTIME
TOPICAL | Status: DC | PRN
  Administered 2020-07-11: 1000 mL

## 2020-07-11 MED ORDER — FLEET ENEMA 7-19 GM/118ML RE ENEM
1.0000 | ENEMA | Freq: Once | RECTAL | Status: DC
  Filled 2020-07-11: qty 1

## 2020-07-11 MED ORDER — MIDAZOLAM HCL 5 MG/5ML IJ SOLN
INTRAMUSCULAR | Status: DC | PRN
  Administered 2020-07-11 (×2): 1 mg via INTRAVENOUS

## 2020-07-11 MED ORDER — OXYCODONE HCL 5 MG PO TABS: 5.0000 mg | ORAL_TABLET | Freq: Once | ORAL | Status: DC | PRN

## 2020-07-11 MED ORDER — SODIUM CHLORIDE 0.9 % IV SOLN
2.0000 g | INTRAVENOUS | Status: AC
  Administered 2020-07-11: 2 g via INTRAVENOUS
  Filled 2020-07-11: qty 2

## 2020-07-11 MED ORDER — PROPOFOL 10 MG/ML IV BOLUS
INTRAVENOUS | Status: DC | PRN
  Administered 2020-07-11: 130 mg via INTRAVENOUS

## 2020-07-11 MED ORDER — CHLORHEXIDINE GLUCONATE CLOTH 2 % EX PADS: 6.0000 | MEDICATED_PAD | Freq: Once | CUTANEOUS | Status: DC

## 2020-07-11 MED ORDER — CARVEDILOL 3.125 MG PO TABS
3.1250 mg | ORAL_TABLET | ORAL | Status: AC
  Administered 2020-07-11: 3.125 mg via ORAL
  Filled 2020-07-11: qty 1

## 2020-07-11 MED ORDER — LIDOCAINE 2% (20 MG/ML) 5 ML SYRINGE
INTRAMUSCULAR | Status: DC | PRN
  Administered 2020-07-11: 100 mg via INTRAVENOUS

## 2020-07-11 MED ORDER — FENTANYL CITRATE (PF) 100 MCG/2ML IJ SOLN: 25.0000 ug | INTRAMUSCULAR | Status: DC | PRN

## 2020-07-11 MED ORDER — BUPIVACAINE HCL (PF) 0.25 % IJ SOLN
INTRAMUSCULAR | Status: AC
  Filled 2020-07-11: qty 30

## 2020-07-11 MED ORDER — BUPIVACAINE LIPOSOME 1.3 % IJ SUSP
20.0000 mL | Freq: Once | INTRAMUSCULAR | Status: DC
  Filled 2020-07-11: qty 20

## 2020-07-11 MED ORDER — ONDANSETRON HCL 4 MG/2ML IJ SOLN: 4.0000 mg | Freq: Once | INTRAMUSCULAR | Status: DC | PRN

## 2020-07-11 MED ORDER — ONDANSETRON HCL 4 MG/2ML IJ SOLN
INTRAMUSCULAR | Status: DC | PRN
  Administered 2020-07-11: 4 mg via INTRAVENOUS

## 2020-07-11 MED ORDER — THROMBIN 20000 UNITS EX KIT: PACK | CUTANEOUS | Status: DC | PRN

## 2020-07-11 MED ORDER — BUPIVACAINE HCL (PF) 0.25 % IJ SOLN
INTRAMUSCULAR | Status: DC | PRN
  Administered 2020-07-11: 20 mL

## 2020-07-11 MED ORDER — ACETAMINOPHEN 500 MG PO TABS
1000.0000 mg | ORAL_TABLET | ORAL | Status: AC
  Administered 2020-07-11: 1000 mg via ORAL
  Filled 2020-07-11: qty 2

## 2020-07-11 MED ORDER — PROPOFOL 10 MG/ML IV BOLUS
INTRAVENOUS | Status: AC
  Filled 2020-07-11: qty 20

## 2020-07-11 MED ORDER — THROMBIN 20000 UNITS EX KIT
PACK | CUTANEOUS | Status: AC
  Filled 2020-07-11: qty 1

## 2020-07-11 MED ORDER — FENTANYL CITRATE (PF) 250 MCG/5ML IJ SOLN
INTRAMUSCULAR | Status: DC | PRN
  Administered 2020-07-11: 150 ug via INTRAVENOUS

## 2020-07-11 MED ORDER — THROMBIN 20000 UNITS EX SOLR
CUTANEOUS | Status: AC
  Filled 2020-07-11: qty 20000

## 2020-07-11 MED ORDER — GABAPENTIN 300 MG PO CAPS
300.0000 mg | ORAL_CAPSULE | ORAL | Status: AC
  Administered 2020-07-11: 300 mg via ORAL
  Filled 2020-07-11: qty 1

## 2020-07-11 MED ORDER — FLEET ENEMA 7-19 GM/118ML RE ENEM: 1.0000 | ENEMA | Freq: Once | RECTAL | Status: DC

## 2020-07-11 MED ORDER — DEXAMETHASONE SODIUM PHOSPHATE 10 MG/ML IJ SOLN
INTRAMUSCULAR | Status: DC | PRN
  Administered 2020-07-11: 5 mg via INTRAVENOUS

## 2020-07-11 MED ORDER — ORAL CARE MOUTH RINSE: 15.0000 mL | Freq: Once | OROMUCOSAL | Status: AC

## 2020-07-11 MED ORDER — OXYCODONE HCL 5 MG PO TABS: 5.0000 mg | ORAL_TABLET | Freq: Four times a day (QID) | ORAL | 0 refills | Status: DC | PRN

## 2020-07-11 MED ORDER — CHLORHEXIDINE GLUCONATE CLOTH 2 % EX PADS
6.0000 | MEDICATED_PAD | Freq: Once | CUTANEOUS | Status: AC
  Administered 2020-07-11 (×2): 6 via TOPICAL

## 2020-07-11 MED ORDER — THROMBIN (RECOMBINANT) 5000 UNITS EX SOLR
CUTANEOUS | Status: AC
  Filled 2020-07-11: qty 5000

## 2020-07-11 SURGICAL SUPPLY — 38 items
BLADE SURG 15 STRL LF DISP TIS (BLADE) IMPLANT
BLADE SURG 15 STRL SS (BLADE) ×2
CANISTER SUCT 3000ML PPV (MISCELLANEOUS) ×2 IMPLANT
COVER MAYO STAND STRL (DRAPES) ×2 IMPLANT
COVER SURGICAL LIGHT HANDLE (MISCELLANEOUS) ×2 IMPLANT
COVER WAND RF STERILE (DRAPES) ×1 IMPLANT
DRAPE UTILITY XL STRL (DRAPES) ×2 IMPLANT
ELECT REM PT RETURN 9FT ADLT (ELECTROSURGICAL) ×2
ELECTRODE REM PT RTRN 9FT ADLT (ELECTROSURGICAL) ×1 IMPLANT
GAUZE 4X4 16PLY RFD (DISPOSABLE) ×2 IMPLANT
GAUZE SPONGE 4X4 12PLY STRL (GAUZE/BANDAGES/DRESSINGS) ×2 IMPLANT
GLOVE BIO SURGEON STRL SZ8 (GLOVE) ×2 IMPLANT
GLOVE BIOGEL PI IND STRL 8 (GLOVE) ×1 IMPLANT
GLOVE BIOGEL PI INDICATOR 8 (GLOVE) ×1
GOWN STRL REUS W/ TWL LRG LVL3 (GOWN DISPOSABLE) ×1 IMPLANT
GOWN STRL REUS W/ TWL XL LVL3 (GOWN DISPOSABLE) ×1 IMPLANT
GOWN STRL REUS W/TWL LRG LVL3 (GOWN DISPOSABLE) ×2
GOWN STRL REUS W/TWL XL LVL3 (GOWN DISPOSABLE) ×2
KIT BASIN OR (CUSTOM PROCEDURE TRAY) ×2 IMPLANT
KIT SIGMOIDOSCOPE (SET/KITS/TRAYS/PACK) IMPLANT
KIT TURNOVER KIT B (KITS) ×2 IMPLANT
NEEDLE 22X1 1/2 (OR ONLY) (NEEDLE) ×2 IMPLANT
NS IRRIG 1000ML POUR BTL (IV SOLUTION) ×2 IMPLANT
PACK LITHOTOMY IV (CUSTOM PROCEDURE TRAY) ×2 IMPLANT
PAD ARMBOARD 7.5X6 YLW CONV (MISCELLANEOUS) ×3 IMPLANT
PENCIL SMOKE EVACUATOR (MISCELLANEOUS) ×2 IMPLANT
SHEARS HARMONIC 9CM CVD (BLADE) ×2 IMPLANT
SPECIMEN JAR SMALL (MISCELLANEOUS) ×2 IMPLANT
SPONGE LAP 18X18 RF (DISPOSABLE) ×1 IMPLANT
SPONGE SURGIFOAM ABS GEL 100 (HEMOSTASIS) ×1 IMPLANT
SURGILUBE 2OZ TUBE FLIPTOP (MISCELLANEOUS) ×2 IMPLANT
SUT CHROMIC 2 0 SH (SUTURE) ×2 IMPLANT
SUT CHROMIC 3 0 SH 27 (SUTURE) ×1 IMPLANT
SYR CONTROL 10ML LL (SYRINGE) ×2 IMPLANT
TOWEL GREEN STERILE FF (TOWEL DISPOSABLE) ×3 IMPLANT
TUBE CONNECTING 12X1/4 (SUCTIONS) ×2 IMPLANT
UNDERPAD 30X36 HEAVY ABSORB (UNDERPADS AND DIAPERS) ×2 IMPLANT
YANKAUER SUCT BULB TIP NO VENT (SUCTIONS) ×2 IMPLANT

## 2020-07-11 NOTE — Progress Notes (Signed)
Fleets enema HS not done by patient. Per patient, he was not aware this was supposed to be done prior to sx. Dr. Grandville Silos aware and okay with 2nd enema not being administered as it is too close to surgery time.

## 2020-07-11 NOTE — Anesthesia Postprocedure Evaluation (Signed)
Anesthesia Post Note  Patient: [name]  Procedure(s) Performed: INTERNAL HEMORRHOIDECTOMY (N/A )     Patient location during evaluation: PACU Anesthesia Type: General Level of consciousness: awake and alert and oriented Pain management: pain level controlled Vital Signs Assessment: post-procedure vital signs reviewed and stable Respiratory status: spontaneous breathing, nonlabored ventilation and respiratory function stable Cardiovascular status: blood pressure returned to baseline and stable Postop Assessment: no apparent nausea or vomiting Anesthetic complications: no   No complications documented.  Last Vitals:  Vitals:   07/11/20 0845 07/11/20 0859  BP: 103/61 127/73  Pulse: 74 75  Resp: 20 20  Temp:  36.5 C  SpO2: 100% 97%    Last Pain:  Vitals:   07/11/20 0859  PainSc: 0-No pain                 Cote Mayabb A.

## 2020-07-11 NOTE — Transfer of Care (Signed)
Immediate Anesthesia Transfer of Care Note  Patient: [name]  Procedure(s) Performed: INTERNAL HEMORRHOIDECTOMY (N/A )  Patient Location: PACU  Anesthesia Type:General  Level of Consciousness: awake, alert  and oriented  Airway & Oxygen Therapy: Patient Spontanous Breathing and Patient connected to nasal cannula oxygen  Post-op Assessment: Report given to RN, Post -op Vital signs reviewed and stable and Patient moving all extremities X 4  Post vital signs: Reviewed and stable  Last Vitals:  Vitals Value Taken Time  BP 121/78 07/11/20 0830  Temp 36.1 C 07/11/20 0830  Pulse 76 07/11/20 0831  Resp 30 07/11/20 0831  SpO2 100 % 07/11/20 0831  Vitals shown include unvalidated device data.  Last Pain:  Vitals:   07/11/20 0700  PainSc: 0-No pain      Patients Stated Pain Goal: 5 (28/11/88 6773)  Complications: No complications documented.
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
## 2020-07-11 NOTE — Op Note (Signed)
  07/11/2020  8:18 AM  PATIENT:  [name]  65 y.o. male  PRE-OPERATIVE DIAGNOSIS:  INTERNAL HEMORRHOID  POST-OPERATIVE DIAGNOSIS:  INTERNAL HEMORRHOID  PROCEDURE:  Procedure(s): INTERNAL HEMORRHOIDECTOMY  SURGEON:  Surgeon(s): Georganna Skeans, MD  ASSISTANTS: none  ANESTHESIA:   local and general  EBL:  Total I/O In: 100 [IV Piggyback:100] Out: -   BLOOD ADMINISTERED:none  DRAINS: none   SPECIMEN:  Excision  DISPOSITION OF SPECIMEN:  PATHOLOGY  COUNTS:  YES  DICTATION: .Dragon Dictation Procedure in detail: Informed consent was obtained.  He received intravenous antibiotics.  He was brought to the operating room and general endotracheal anesthesia was administered by the anesthesia staff.  He was placed in lithotomy position and his perianal region was prepped and draped in a sterile fashion.  We did a timeout procedure.  I then injected a perianal block with a mix of Marcaine and Exparel.  Digital rectal exam revealed a likely internal hemorrhoid anteriorly at the 2 o'clock position.  I then performed examination under anesthesia.  He essentially had 1 large internal hemorrhoid with evidence of recent bleeding.  There were no other significant internal hemorrhoids or masses.  I grasped the hemorrhoid and I incised the most external portion with a scalpel.  I then used the harmonic scalpel to carefully dissect out the hemorrhoidal vessels and divided.  The tissues were also removed with the harmonic.  I stayed superficial to the sphincter musculature.  I then used cautery and a figure-of-eight 2-0 chromic suture to get excellent hemostasis.  I again examined his rectal area and no other significant hemorrhoids were seen.  There was excellent hemostasis.  I placed a thrombin-soaked Gelfoam roll as an endorectal dressing.  He tolerated the procedure well without apparent complication.  All counts were correct.  He was taken to recovery in stable condition. PATIENT DISPOSITION:   PACU - hemodynamically stable.   Delay start of Pharmacological VTE agent (>24hrs) due to surgical blood loss or risk of bleeding:  no  Georganna Skeans, MD, MPH, FACS Pager: [phone]  1/7/20228:18 AM

## 2020-07-11 NOTE — Anesthesia Procedure Notes (Signed)
Procedure Name: Intubation Date/Time: 07/11/2020 7:40 AM Performed by: Mariea Clonts, CRNA Pre-anesthesia Checklist: Patient identified, Emergency Drugs available, Suction available and Patient being monitored Patient Re-evaluated:Patient Re-evaluated prior to induction Oxygen Delivery Method: Circle System Utilized Preoxygenation: Pre-oxygenation with 100% oxygen Induction Type: IV induction Ventilation: Mask ventilation without difficulty Laryngoscope Size: Miller and 3 Grade View: Grade I Tube type: Oral Tube size: 8.0 mm Number of attempts: 1 Airway Equipment and Method: Stylet and Oral airway Placement Confirmation: ETT inserted through vocal cords under direct vision,  positive ETCO2 and breath sounds checked- equal and bilateral Tube secured with: Tape Dental Injury: Teeth and Oropharynx as per pre-operative assessment

## 2020-07-12 ENCOUNTER — Encounter (HOSPITAL_COMMUNITY): Payer: Self-pay | Admitting: General Surgery

## 2020-07-14 LAB — SURGICAL PATHOLOGY

## 2020-07-18 ENCOUNTER — Ambulatory Visit (HOSPITAL_COMMUNITY)
Admission: EM | Admit: 2020-07-18 | Discharge: 2020-07-18 | Disposition: A | Discharge status: Home / Self Care | Payer: Medicare HMO | Attending: Emergency Medicine | Admitting: Emergency Medicine

## 2020-07-18 ENCOUNTER — Encounter (HOSPITAL_COMMUNITY): Payer: Self-pay

## 2020-07-18 DIAGNOSIS — R350 Frequency of micturition: Secondary | ICD-10-CM | POA: Diagnosis not present

## 2020-07-18 DIAGNOSIS — R300 Dysuria: Secondary | ICD-10-CM | POA: Diagnosis not present

## 2020-07-18 LAB — BASIC METABOLIC PANEL
Anion gap: 8 (ref 5–15)
BUN: 11 mg/dL (ref 8–23)
CO2: 26 mmol/L (ref 22–32)
Calcium: 8.9 mg/dL (ref 8.9–10.3)
Chloride: 103 mmol/L (ref 98–111)
Creatinine, Ser: 1.23 mg/dL (ref 0.61–1.24)
GFR, Estimated: >60 mL/min (ref >60)
Glucose, Bld: 98 mg/dL (ref 70–99)
Potassium: 3.8 mmol/L (ref 3.5–5.1)
Sodium: 137 mmol/L (ref 135–145)

## 2020-07-18 LAB — CBC WITH DIFFERENTIAL/PLATELET
Abs Immature Granulocytes: 0.03 10*3/uL (ref 0.00–0.07)
Basophils Absolute: 0 10*3/uL (ref 0.0–0.1)
Basophils Relative: 1 %
Eosinophils Absolute: 0.1 10*3/uL (ref 0.0–0.5)
Eosinophils Relative: 2 %
HCT: 31.8 % — ABNORMAL LOW (ref 39.0–52.0)
Hemoglobin: 10.2 g/dL — ABNORMAL LOW (ref 13.0–17.0)
Immature Granulocytes: 1 %
Lymphocytes Relative: 22 %
Lymphs Abs: 0.7 10*3/uL (ref 0.7–4.0)
MCH: 30.8 pg (ref 26.0–34.0)
MCHC: 32.1 g/dL (ref 30.0–36.0)
MCV: 96.1 fL (ref 80.0–100.0)
Monocytes Absolute: 0.6 10*3/uL (ref 0.1–1.0)
Monocytes Relative: 18 %
Neutro Abs: 1.7 10*3/uL (ref 1.7–7.7)
Neutrophils Relative %: 56 %
Platelets: 103 10*3/uL — ABNORMAL LOW (ref 150–400)
RBC: 3.31 MIL/uL — ABNORMAL LOW (ref 4.22–5.81)
RDW: 24.6 % — ABNORMAL HIGH (ref 11.5–15.5)
WBC: 3 10*3/uL — ABNORMAL LOW (ref 4.0–10.5)
nRBC: 0 % (ref 0.0–0.2)

## 2020-07-18 LAB — POCT URINALYSIS DIPSTICK, ED / UC
Bilirubin Urine: NEGATIVE
Glucose, UA: NEGATIVE mg/dL
Ketones, ur: NEGATIVE mg/dL
Leukocytes,Ua: NEGATIVE
Nitrite: NEGATIVE
Protein, ur: NEGATIVE mg/dL
Specific Gravity, Urine: 1.02 (ref 1.005–1.030)
Urobilinogen, UA: 0.2 mg/dL (ref 0.0–1.0)
pH: 7 (ref 5.0–8.0)

## 2020-07-18 MED ORDER — CEPHALEXIN 500 MG PO CAPS: 500.0000 mg | ORAL_CAPSULE | Freq: Four times a day (QID) | ORAL | 0 refills | Status: AC
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
## 2020-07-18 NOTE — Discharge Instructions (Addendum)
Increase your water intake, take your lasix as prescribed.  I am starting antibiotics here today. We will call you if there are any concerning findings with your lab tests.  If you feel worse over the next 24 hours please go to the ER for further evaluation

## 2020-07-18 NOTE — ED Triage Notes (Signed)
Pt c/o inability to fully empty bladder, pressure/pain in bladder area with urination for two days. Also report bilateral flank/low back pain. Pt states he was awakened frequently last night with urge to urinate but only evacuating a "little at a time".   Denies n/v/d, fever, hematuria.  States he had hemorrhoid surgery last week. Hasn't checked glucose for several days.

## 2020-07-20 LAB — URINE CULTURE: Culture: NO GROWTH

## 2020-07-25 DIAGNOSIS — H2589 Other age-related cataract: Secondary | ICD-10-CM | POA: Diagnosis not present

## 2020-08-12 DIAGNOSIS — H31011 Macula scars of posterior pole (postinflammatory) (post-traumatic), right eye: Secondary | ICD-10-CM | POA: Diagnosis not present

## 2020-08-12 DIAGNOSIS — Z9841 Cataract extraction status, right eye: Secondary | ICD-10-CM | POA: Diagnosis not present

## 2020-08-12 DIAGNOSIS — E113292 Type 2 diabetes mellitus with mild nonproliferative diabetic retinopathy without macular edema, left eye: Secondary | ICD-10-CM | POA: Diagnosis not present

## 2020-08-12 DIAGNOSIS — H40013 Open angle with borderline findings, low risk, bilateral: Secondary | ICD-10-CM | POA: Diagnosis not present

## 2020-09-23 DIAGNOSIS — H53131 Sudden visual loss, right eye: Secondary | ICD-10-CM | POA: Diagnosis not present

## 2020-09-23 DIAGNOSIS — H33051 Total retinal detachment, right eye: Secondary | ICD-10-CM | POA: Diagnosis not present

## 2020-09-23 DIAGNOSIS — H25812 Combined forms of age-related cataract, left eye: Secondary | ICD-10-CM | POA: Diagnosis not present

## 2020-09-23 DIAGNOSIS — Z961 Presence of intraocular lens: Secondary | ICD-10-CM | POA: Diagnosis not present

## 2020-09-25 ENCOUNTER — Ambulatory Visit: Admit: 2020-09-25 | Payer: Medicare HMO | Admitting: Ophthalmology

## 2020-09-25 DIAGNOSIS — H33051 Total retinal detachment, right eye: Secondary | ICD-10-CM | POA: Diagnosis not present

## 2020-09-25 DIAGNOSIS — H3521 Other non-diabetic proliferative retinopathy, right eye: Secondary | ICD-10-CM | POA: Diagnosis not present

## 2020-09-25 SURGERY — REPAIR, RETINAL DETACHMENT, COMPLEX
Anesthesia: Monitor Anesthesia Care | Laterality: Right

## 2020-09-26 ENCOUNTER — Other Ambulatory Visit: Payer: Self-pay | Admitting: Family Medicine

## 2020-09-26 DIAGNOSIS — K219 Gastro-esophageal reflux disease without esophagitis: Secondary | ICD-10-CM

## 2020-09-26 NOTE — Telephone Encounter (Signed)
Requested medications are due for refill today yes  Requested medications are on the active medication list yes  Last refill 08/29/20  Last visit 05/2020, however, I do not see this med/dx addressed.  Future visit scheduled no, was suppose to schedule for May, no visit scheduled  Notes to clinic Failed protocol due to no valid visit within 6  months, no upcoming visit scheduled.

## 2020-10-03 DIAGNOSIS — H35371 Puckering of macula, right eye: Secondary | ICD-10-CM | POA: Diagnosis not present

## 2020-10-03 DIAGNOSIS — H31011 Macula scars of posterior pole (postinflammatory) (post-traumatic), right eye: Secondary | ICD-10-CM | POA: Diagnosis not present

## 2020-10-06 ENCOUNTER — Other Ambulatory Visit: Payer: Self-pay | Admitting: Family Medicine

## 2020-10-06 DIAGNOSIS — E7800 Pure hypercholesterolemia, unspecified: Secondary | ICD-10-CM

## 2020-10-06 DIAGNOSIS — E1159 Type 2 diabetes mellitus with other circulatory complications: Secondary | ICD-10-CM

## 2020-10-06 DIAGNOSIS — I152 Hypertension secondary to endocrine disorders: Secondary | ICD-10-CM

## 2020-10-22 ENCOUNTER — Other Ambulatory Visit: Payer: Self-pay | Admitting: Family Medicine

## 2020-10-22 DIAGNOSIS — M1A9XX0 Chronic gout, unspecified, without tophus (tophi): Secondary | ICD-10-CM

## 2020-10-22 NOTE — Telephone Encounter (Signed)
Requested Prescriptions  Pending Prescriptions Disp Refills  . allopurinol (ZYLOPRIM) 300 MG tablet [Pharmacy Med Name: ALLOPURINOL 300 MG Tablet] 90 tablet 2    Sig: TAKE 1 TABLET EVERY DAY     Endocrinology:  Gout Agents Failed - 10/22/2020  7:17 PM      Failed - Uric Acid in normal range and within 360 days    Uric Acid, Serum  Date Value Ref Range Status  02/09/2016 3.3 (L) 4.0 - 8.0 mg/dL Final    Comment:    Therapeutic target for gout patients: <6.0 mg/dL         Passed - Cr in normal range and within 360 days    Creatinine  Date Value Ref Range Status  05/26/2020 1.38 (H) 0.61 - 1.24 mg/dL Final  07/07/2017 1.2 0.7 - 1.3 mg/dL Final   Creatinine, Ser  Date Value Ref Range Status  07/18/2020 1.23 0.61 - 1.24 mg/dL Final   Creatinine, Urine  Date Value Ref Range Status  12/29/2012 109.4 mg/dL Final         Passed - Valid encounter within last 12 months    Recent Outpatient Visits          5 months ago Type 2 diabetes mellitus with other specified complication, without long-term current use of insulin ([place])   [place], [place], MD   1 year ago Type 2 diabetes mellitus with other specified complication, without long-term current use of insulin ([place])   [place], [place], MD   1 year ago Type 2 diabetes mellitus with other specified complication, without long-term current use of insulin ([place])   [place], [place], MD   2 years ago Type 2 diabetes mellitus with other specified complication, without long-term current use of insulin ([place])   [place], [place], MD   2 years ago Chronic gout without tophus, unspecified cause, unspecified site   [place], Enobong, MD

## 2020-10-24 ENCOUNTER — Other Ambulatory Visit: Payer: Self-pay | Admitting: Family Medicine

## 2020-10-24 DIAGNOSIS — K219 Gastro-esophageal reflux disease without esophagitis: Secondary | ICD-10-CM

## 2020-10-24 NOTE — Telephone Encounter (Signed)
Requested medications are due for refill today yes  Requested medications are on the active medication list yes  Last visit 05/2020  Future visit scheduled no  Notes to clinic Has already had a curtesy refill and there is no upcoming appointment scheduled.

## 2020-10-24 NOTE — Telephone Encounter (Signed)
Requested medications are due for refill today yes  Requested medications are on the active medication list yes  Last refill 09/26/20  Last visit 05/2020  Future visit scheduled no  Notes to clinic Has already had a curtesy refill and there is no upcoming appointment scheduled.

## 2020-11-13 DIAGNOSIS — H33021 Retinal detachment with multiple breaks, right eye: Secondary | ICD-10-CM | POA: Diagnosis not present

## 2020-11-13 DIAGNOSIS — H3521 Other non-diabetic proliferative retinopathy, right eye: Secondary | ICD-10-CM | POA: Diagnosis not present

## 2020-11-13 DIAGNOSIS — H53131 Sudden visual loss, right eye: Secondary | ICD-10-CM | POA: Diagnosis not present

## 2020-11-24 ENCOUNTER — Ambulatory Visit (HOSPITAL_COMMUNITY): Admission: RE | Admit: 2020-11-24 | Payer: Medicare HMO | Source: Ambulatory Visit

## 2020-11-24 ENCOUNTER — Inpatient Hospital Stay: Payer: Medicare HMO

## 2020-11-25 ENCOUNTER — Inpatient Hospital Stay: Payer: Medicare HMO | Admitting: Internal Medicine

## 2020-11-25 DIAGNOSIS — H33011 Retinal detachment with single break, right eye: Secondary | ICD-10-CM | POA: Diagnosis not present

## 2020-11-25 DIAGNOSIS — H33021 Retinal detachment with multiple breaks, right eye: Secondary | ICD-10-CM | POA: Diagnosis not present

## 2020-11-27 ENCOUNTER — Telehealth: Payer: Self-pay | Admitting: Internal Medicine

## 2020-11-27 NOTE — Telephone Encounter (Signed)
Scheduled appt sper 5/23 sch msg. Called pt, no answer. Left msg with appt date and time.

## 2020-12-02 ENCOUNTER — Ambulatory Visit (HOSPITAL_COMMUNITY)
Admission: RE | Admit: 2020-12-02 | Discharge: 2020-12-02 | Disposition: A | Discharge status: Home / Self Care | Payer: Medicare HMO | Source: Ambulatory Visit | Attending: Internal Medicine | Admitting: Internal Medicine

## 2020-12-02 ENCOUNTER — Other Ambulatory Visit: Payer: Self-pay

## 2020-12-02 ENCOUNTER — Inpatient Hospital Stay: Payer: Medicare HMO | Attending: Physician Assistant

## 2020-12-02 DIAGNOSIS — Z85118 Personal history of other malignant neoplasm of bronchus and lung: Secondary | ICD-10-CM | POA: Insufficient documentation

## 2020-12-02 DIAGNOSIS — K7460 Unspecified cirrhosis of liver: Secondary | ICD-10-CM | POA: Diagnosis not present

## 2020-12-02 DIAGNOSIS — J432 Centrilobular emphysema: Secondary | ICD-10-CM | POA: Diagnosis not present

## 2020-12-02 DIAGNOSIS — C3490 Malignant neoplasm of unspecified part of unspecified bronchus or lung: Secondary | ICD-10-CM | POA: Insufficient documentation

## 2020-12-02 DIAGNOSIS — J189 Pneumonia, unspecified organism: Secondary | ICD-10-CM | POA: Diagnosis not present

## 2020-12-02 DIAGNOSIS — J8410 Pulmonary fibrosis, unspecified: Secondary | ICD-10-CM | POA: Diagnosis not present

## 2020-12-02 LAB — CMP (CANCER CENTER ONLY)
ALT: 10 U/L (ref 0–44)
AST: 22 U/L (ref 15–41)
Albumin: 3.3 g/dL — ABNORMAL LOW (ref 3.5–5.0)
Alkaline Phosphatase: 118 U/L (ref 38–126)
Anion gap: 12 (ref 5–15)
BUN: 22 mg/dL (ref 8–23)
CO2: 18 mmol/L — ABNORMAL LOW (ref 22–32)
Calcium: 8.8 mg/dL — ABNORMAL LOW (ref 8.9–10.3)
Chloride: 108 mmol/L (ref 98–111)
Creatinine: 1.84 mg/dL — ABNORMAL HIGH (ref 0.61–1.24)
GFR, Estimated: 40 mL/min — ABNORMAL LOW (ref >60)
Glucose, Bld: 98 mg/dL (ref 70–99)
Potassium: 4.1 mmol/L (ref 3.5–5.1)
Sodium: 138 mmol/L (ref 135–145)
Total Bilirubin: 0.4 mg/dL (ref 0.3–1.2)
Total Protein: 8.3 g/dL — ABNORMAL HIGH (ref 6.5–8.1)

## 2020-12-02 LAB — CBC WITH DIFFERENTIAL (CANCER CENTER ONLY)
Abs Immature Granulocytes: 0.03 10*3/uL (ref 0.00–0.07)
Basophils Absolute: 0 10*3/uL (ref 0.0–0.1)
Basophils Relative: 1 %
Eosinophils Absolute: 0.1 10*3/uL (ref 0.0–0.5)
Eosinophils Relative: 4 %
HCT: 33.1 % — ABNORMAL LOW (ref 39.0–52.0)
Hemoglobin: 10.7 g/dL — ABNORMAL LOW (ref 13.0–17.0)
Immature Granulocytes: 1 %
Lymphocytes Relative: 27 %
Lymphs Abs: 1 10*3/uL (ref 0.7–4.0)
MCH: 29.6 pg (ref 26.0–34.0)
MCHC: 32.3 g/dL (ref 30.0–36.0)
MCV: 91.4 fL (ref 80.0–100.0)
Monocytes Absolute: 0.5 10*3/uL (ref 0.1–1.0)
Monocytes Relative: 13 %
Neutro Abs: 1.9 10*3/uL (ref 1.7–7.7)
Neutrophils Relative %: 54 %
Platelet Count: 120 10*3/uL — ABNORMAL LOW (ref 150–400)
RBC: 3.62 MIL/uL — ABNORMAL LOW (ref 4.22–5.81)
RDW: 22.4 % — ABNORMAL HIGH (ref 11.5–15.5)
WBC Count: 3.6 10*3/uL — ABNORMAL LOW (ref 4.0–10.5)
nRBC: 0 % (ref 0.0–0.2)

## 2020-12-03 ENCOUNTER — Ambulatory Visit: Payer: Medicare HMO | Admitting: Internal Medicine

## 2020-12-04 ENCOUNTER — Encounter: Payer: Self-pay | Admitting: Physician Assistant

## 2020-12-04 ENCOUNTER — Ambulatory Visit: Payer: Medicare HMO | Admitting: Physician Assistant

## 2020-12-04 ENCOUNTER — Telehealth: Payer: Self-pay | Admitting: Physician Assistant

## 2020-12-04 ENCOUNTER — Inpatient Hospital Stay: Payer: Medicare HMO | Attending: Physician Assistant | Admitting: Physician Assistant

## 2020-12-04 ENCOUNTER — Other Ambulatory Visit: Payer: Self-pay

## 2020-12-04 VITALS — BP 125/78 | HR 77 | Temp 96.8°F | Resp 19 | Ht 74.0 in | Wt 182.3 lb

## 2020-12-04 DIAGNOSIS — Z923 Personal history of irradiation: Secondary | ICD-10-CM | POA: Diagnosis not present

## 2020-12-04 DIAGNOSIS — Z85118 Personal history of other malignant neoplasm of bronchus and lung: Secondary | ICD-10-CM | POA: Diagnosis not present

## 2020-12-04 DIAGNOSIS — Z9221 Personal history of antineoplastic chemotherapy: Secondary | ICD-10-CM | POA: Diagnosis not present

## 2020-12-04 DIAGNOSIS — C3491 Malignant neoplasm of unspecified part of right bronchus or lung: Secondary | ICD-10-CM | POA: Diagnosis not present

## 2020-12-04 DIAGNOSIS — F17210 Nicotine dependence, cigarettes, uncomplicated: Secondary | ICD-10-CM | POA: Diagnosis not present

## 2020-12-04 DIAGNOSIS — K7460 Unspecified cirrhosis of liver: Secondary | ICD-10-CM | POA: Diagnosis not present

## 2020-12-04 NOTE — Telephone Encounter (Signed)
R/s appt time per 6/2 sch msg. Pt aware.
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
## 2020-12-05 ENCOUNTER — Ambulatory Visit (HOSPITAL_COMMUNITY): Payer: Medicare HMO | Admitting: Anesthesiology

## 2020-12-05 ENCOUNTER — Encounter (HOSPITAL_COMMUNITY): Payer: Self-pay | Admitting: Ophthalmology

## 2020-12-05 ENCOUNTER — Encounter (HOSPITAL_COMMUNITY)
Admission: RE | Disposition: A | Discharge status: Home / Self Care | Payer: Self-pay | Source: Ambulatory Visit | Attending: Ophthalmology

## 2020-12-05 ENCOUNTER — Other Ambulatory Visit: Payer: Self-pay

## 2020-12-05 ENCOUNTER — Ambulatory Visit (HOSPITAL_COMMUNITY)
Admission: RE | Admit: 2020-12-05 | Discharge: 2020-12-05 | Disposition: A | Discharge status: Home / Self Care | Payer: Medicare HMO | Source: Ambulatory Visit | Attending: Ophthalmology | Admitting: Ophthalmology

## 2020-12-05 DIAGNOSIS — E876 Hypokalemia: Secondary | ICD-10-CM | POA: Diagnosis not present

## 2020-12-05 DIAGNOSIS — H31011 Macula scars of posterior pole (postinflammatory) (post-traumatic), right eye: Secondary | ICD-10-CM | POA: Diagnosis not present

## 2020-12-05 DIAGNOSIS — H5711 Ocular pain, right eye: Secondary | ICD-10-CM | POA: Diagnosis not present

## 2020-12-05 DIAGNOSIS — H40051 Ocular hypertension, right eye: Secondary | ICD-10-CM | POA: Insufficient documentation

## 2020-12-05 DIAGNOSIS — E785 Hyperlipidemia, unspecified: Secondary | ICD-10-CM | POA: Diagnosis not present

## 2020-12-05 DIAGNOSIS — H40053 Ocular hypertension, bilateral: Secondary | ICD-10-CM | POA: Diagnosis not present

## 2020-12-05 DIAGNOSIS — H338 Other retinal detachments: Secondary | ICD-10-CM | POA: Diagnosis not present

## 2020-12-05 DIAGNOSIS — Z20822 Contact with and (suspected) exposure to covid-19: Secondary | ICD-10-CM | POA: Insufficient documentation

## 2020-12-05 DIAGNOSIS — T85398A Other mechanical complication of other ocular prosthetic devices, implants and grafts, initial encounter: Secondary | ICD-10-CM | POA: Diagnosis not present

## 2020-12-05 HISTORY — PX: GAS INSERTION: SHX5336

## 2020-12-05 HISTORY — PX: PHOTOCOAGULATION WITH LASER: SHX6027

## 2020-12-05 HISTORY — DX: Personal history of urinary calculi: Z87.442

## 2020-12-05 HISTORY — PX: PARS PLANA VITRECTOMY: SHX2166

## 2020-12-05 HISTORY — PX: AIR/FLUID EXCHANGE: SHX6494

## 2020-12-05 HISTORY — PX: SILICON OIL REMOVAL: SHX5305

## 2020-12-05 LAB — SURGICAL PCR SCREEN
MRSA, PCR: NEGATIVE
Staphylococcus aureus: NEGATIVE

## 2020-12-05 LAB — GLUCOSE, CAPILLARY: Glucose-Capillary: 115 mg/dL — ABNORMAL HIGH (ref 70–99)

## 2020-12-05 LAB — SARS CORONAVIRUS 2 BY RT PCR (HOSPITAL ORDER, PERFORMED IN ~~LOC~~ HOSPITAL LAB): SARS Coronavirus 2: NEGATIVE

## 2020-12-05 SURGERY — PARS PLANA VITRECTOMY WITH 25 GAUGE
Anesthesia: Monitor Anesthesia Care | Site: Eye | Laterality: Right

## 2020-12-05 MED ORDER — PROPOFOL 10 MG/ML IV BOLUS
INTRAVENOUS | Status: DC | PRN
  Administered 2020-12-05: 110 mg via INTRAVENOUS
  Administered 2020-12-05: 40 mg via INTRAVENOUS

## 2020-12-05 MED ORDER — CYCLOPENTOLATE HCL 1 % OP SOLN
OPHTHALMIC | Status: AC
  Administered 2020-12-05: 1 [drp] via OPHTHALMIC
  Filled 2020-12-05: qty 2

## 2020-12-05 MED ORDER — DEXAMETHASONE SODIUM PHOSPHATE 10 MG/ML IJ SOLN
INTRAMUSCULAR | Status: DC | PRN
  Administered 2020-12-05: 10 mg

## 2020-12-05 MED ORDER — 0.9 % SODIUM CHLORIDE (POUR BTL) OPTIME
TOPICAL | Status: DC | PRN
  Administered 2020-12-05: 200 mL

## 2020-12-05 MED ORDER — BUPIVACAINE HCL (PF) 0.75 % IJ SOLN
INTRAMUSCULAR | Status: AC
  Filled 2020-12-05: qty 10

## 2020-12-05 MED ORDER — ATROPINE SULFATE 1 % OP SOLN
OPHTHALMIC | Status: AC
  Filled 2020-12-05: qty 5

## 2020-12-05 MED ORDER — HYPROMELLOSE (GONIOSCOPIC) 2.5 % OP SOLN
OPHTHALMIC | Status: AC
  Filled 2020-12-05: qty 15

## 2020-12-05 MED ORDER — BSS IO SOLN
INTRAOCULAR | Status: DC | PRN
  Administered 2020-12-05 (×2): 15 mL via INTRAOCULAR

## 2020-12-05 MED ORDER — ORAL CARE MOUTH RINSE: 15.0000 mL | Freq: Once | OROMUCOSAL | Status: AC

## 2020-12-05 MED ORDER — DEXAMETHASONE SODIUM PHOSPHATE 10 MG/ML IJ SOLN
INTRAMUSCULAR | Status: AC
  Filled 2020-12-05: qty 1

## 2020-12-05 MED ORDER — BSS IO SOLN
INTRAOCULAR | Status: AC
  Filled 2020-12-05: qty 15

## 2020-12-05 MED ORDER — HYPROMELLOSE (GONIOSCOPIC) 2.5 % OP SOLN
OPHTHALMIC | Status: DC | PRN
  Administered 2020-12-05: 1 [drp] via OPHTHALMIC

## 2020-12-05 MED ORDER — EPHEDRINE SULFATE-NACL 50-0.9 MG/10ML-% IV SOSY
PREFILLED_SYRINGE | INTRAVENOUS | Status: DC | PRN
  Administered 2020-12-05: 10 mg via INTRAVENOUS
  Administered 2020-12-05: 5 mg via INTRAVENOUS

## 2020-12-05 MED ORDER — DEXAMETHASONE SODIUM PHOSPHATE 10 MG/ML IJ SOLN
INTRAMUSCULAR | Status: DC | PRN
  Administered 2020-12-05: 10 mg via INTRAVENOUS

## 2020-12-05 MED ORDER — ALBUTEROL SULFATE (2.5 MG/3ML) 0.083% IN NEBU: 2.5000 mg | INHALATION_SOLUTION | Freq: Once | RESPIRATORY_TRACT | Status: AC

## 2020-12-05 MED ORDER — PROPARACAINE HCL 0.5 % OP SOLN
1.0000 [drp] | OPHTHALMIC | Status: AC | PRN
  Administered 2020-12-05 (×2): 1 [drp] via OPHTHALMIC

## 2020-12-05 MED ORDER — PHENYLEPHRINE HCL 2.5 % OP SOLN
1.0000 [drp] | OPHTHALMIC | Status: AC | PRN
  Administered 2020-12-05 (×2): 1 [drp] via OPHTHALMIC

## 2020-12-05 MED ORDER — PROPOFOL 10 MG/ML IV BOLUS
INTRAVENOUS | Status: AC
  Filled 2020-12-05: qty 20

## 2020-12-05 MED ORDER — SUCCINYLCHOLINE CHLORIDE 200 MG/10ML IV SOSY
PREFILLED_SYRINGE | INTRAVENOUS | Status: DC | PRN
  Administered 2020-12-05: 120 mg via INTRAVENOUS

## 2020-12-05 MED ORDER — IPRATROPIUM-ALBUTEROL 0.5-2.5 (3) MG/3ML IN SOLN: 3.0000 mL | Freq: Once | RESPIRATORY_TRACT | Status: DC

## 2020-12-05 MED ORDER — OFLOXACIN 0.3 % OP SOLN
1.0000 [drp] | OPHTHALMIC | Status: AC | PRN
  Administered 2020-12-05 (×2): 1 [drp] via OPHTHALMIC

## 2020-12-05 MED ORDER — BSS PLUS IO SOLN
INTRAOCULAR | Status: AC
  Filled 2020-12-05: qty 500

## 2020-12-05 MED ORDER — PHENYLEPHRINE HCL 2.5 % OP SOLN
OPHTHALMIC | Status: AC
  Administered 2020-12-05: 1 [drp] via OPHTHALMIC
  Filled 2020-12-05: qty 2

## 2020-12-05 MED ORDER — FENTANYL CITRATE (PF) 250 MCG/5ML IJ SOLN
INTRAMUSCULAR | Status: AC
  Filled 2020-12-05: qty 5

## 2020-12-05 MED ORDER — EPINEPHRINE PF 1 MG/ML IJ SOLN
INTRAOCULAR | Status: DC | PRN
  Administered 2020-12-05: 500 mL

## 2020-12-05 MED ORDER — EPINEPHRINE PF 1 MG/ML IJ SOLN
INTRAMUSCULAR | Status: AC
  Filled 2020-12-05: qty 1

## 2020-12-05 MED ORDER — LIDOCAINE HCL 2 % IJ SOLN
INTRAMUSCULAR | Status: DC | PRN
  Administered 2020-12-05: 6 mL via RETROBULBAR

## 2020-12-05 MED ORDER — LACTATED RINGERS IV SOLN: INTRAVENOUS | Status: DC | PRN

## 2020-12-05 MED ORDER — PHENYLEPHRINE 40 MCG/ML (10ML) SYRINGE FOR IV PUSH (FOR BLOOD PRESSURE SUPPORT)
PREFILLED_SYRINGE | INTRAVENOUS | Status: DC | PRN
  Administered 2020-12-05: 80 ug via INTRAVENOUS
  Administered 2020-12-05: 120 ug via INTRAVENOUS

## 2020-12-05 MED ORDER — OFLOXACIN 0.3 % OP SOLN
OPHTHALMIC | Status: AC
  Administered 2020-12-05: 1 [drp] via OPHTHALMIC
  Filled 2020-12-05: qty 5

## 2020-12-05 MED ORDER — CHLORHEXIDINE GLUCONATE 0.12 % MT SOLN
15.0000 mL | Freq: Once | OROMUCOSAL | Status: AC
  Administered 2020-12-05: 15 mL via OROMUCOSAL
  Filled 2020-12-05: qty 15

## 2020-12-05 MED ORDER — HYALURONIDASE HUMAN 150 UNIT/ML IJ SOLN
INTRAMUSCULAR | Status: AC
  Filled 2020-12-05: qty 1

## 2020-12-05 MED ORDER — TOBRAMYCIN-DEXAMETHASONE 0.3-0.1 % OP OINT
TOPICAL_OINTMENT | OPHTHALMIC | Status: AC
  Filled 2020-12-05: qty 3.5

## 2020-12-05 MED ORDER — ALBUTEROL SULFATE (2.5 MG/3ML) 0.083% IN NEBU
INHALATION_SOLUTION | RESPIRATORY_TRACT | Status: AC
  Administered 2020-12-05: 2.5 mg via RESPIRATORY_TRACT
  Filled 2020-12-05: qty 3

## 2020-12-05 MED ORDER — CYCLOPENTOLATE HCL 1 % OP SOLN
1.0000 [drp] | OPHTHALMIC | Status: AC | PRN
  Administered 2020-12-05 (×2): 1 [drp] via OPHTHALMIC

## 2020-12-05 MED ORDER — PROPARACAINE HCL 0.5 % OP SOLN
OPHTHALMIC | Status: AC
  Administered 2020-12-05: 1 [drp] via OPHTHALMIC
  Filled 2020-12-05: qty 15

## 2020-12-05 MED ORDER — ONDANSETRON HCL 4 MG/2ML IJ SOLN
INTRAMUSCULAR | Status: DC | PRN
  Administered 2020-12-05: 4 mg via INTRAVENOUS

## 2020-12-05 MED ORDER — CEFAZOLIN SUBCONJUNCTIVAL INJECTION 100 MG/0.5 ML
INJECTION | SUBCONJUNCTIVAL | Status: DC | PRN
  Administered 2020-12-05: 100 mg via SUBCONJUNCTIVAL

## 2020-12-05 MED ORDER — SODIUM CHLORIDE 0.9 % IV SOLN: INTRAVENOUS | Status: DC

## 2020-12-05 MED ORDER — FENTANYL CITRATE (PF) 250 MCG/5ML IJ SOLN
INTRAMUSCULAR | Status: DC | PRN
  Administered 2020-12-05: 100 ug via INTRAVENOUS
  Administered 2020-12-05: 25 ug via INTRAVENOUS

## 2020-12-05 MED ORDER — ACETAMINOPHEN 500 MG PO TABS
1000.0000 mg | ORAL_TABLET | Freq: Once | ORAL | Status: AC
  Administered 2020-12-05: 1000 mg via ORAL
  Filled 2020-12-05: qty 2

## 2020-12-05 MED ORDER — FENTANYL CITRATE (PF) 100 MCG/2ML IJ SOLN: 25.0000 ug | INTRAMUSCULAR | Status: DC | PRN

## 2020-12-05 MED ORDER — LIDOCAINE HCL 2 % IJ SOLN
INTRAMUSCULAR | Status: AC
  Filled 2020-12-05: qty 20

## 2020-12-05 MED ORDER — TOBRAMYCIN-DEXAMETHASONE 0.3-0.1 % OP OINT
TOPICAL_OINTMENT | OPHTHALMIC | Status: DC | PRN
  Administered 2020-12-05: 1

## 2020-12-05 MED ORDER — CEFAZOLIN SUBCONJUNCTIVAL INJECTION 100 MG/0.5 ML
100.0000 mg | INJECTION | SUBCONJUNCTIVAL | Status: DC
  Filled 2020-12-05: qty 5

## 2020-12-05 SURGICAL SUPPLY — 94 items
APL SRG 3 HI ABS STRL LF PLS (MISCELLANEOUS) ×12
APL SWBSTK 6 STRL LF DISP (MISCELLANEOUS) ×6
APPLICATOR COTTON TIP 6 STRL (MISCELLANEOUS) ×4 IMPLANT
APPLICATOR COTTON TIP 6IN STRL (MISCELLANEOUS) ×8
APPLICATOR DR MATTHEWS STRL (MISCELLANEOUS) ×16 IMPLANT
BALL CTTN LRG ABS STRL LF (GAUZE/BANDAGES/DRESSINGS) ×9
BAND WRIST GAS GREEN (MISCELLANEOUS) IMPLANT
BLADE EYE CATARACT 19 1.4 BEAV (BLADE) IMPLANT
BLADE MVR KNIFE 19G (BLADE) IMPLANT
BLADE STAB KNIFE 15DEG (BLADE) ×2 IMPLANT
BNDG EYE OVAL (GAUZE/BANDAGES/DRESSINGS) ×2 IMPLANT
CANNULA ANT CHAM MAIN (OPHTHALMIC RELATED) IMPLANT
CANNULA DUAL BORE 23G (CANNULA) IMPLANT
CANNULA VLV SOFT TIP 25G (OPHTHALMIC) ×2 IMPLANT
CANNULA VLV SOFT TIP 25GA (OPHTHALMIC) ×4 IMPLANT
CAUTERY EYE LOW TEMP 1300F FIN (OPHTHALMIC RELATED) IMPLANT
CLSR STERI-STRIP ANTIMIC 1/2X4 (GAUZE/BANDAGES/DRESSINGS) ×2 IMPLANT
COTTONBALL LRG STERILE PKG (GAUZE/BANDAGES/DRESSINGS) ×12 IMPLANT
COVER SURGICAL LIGHT HANDLE (MISCELLANEOUS) ×4 IMPLANT
COVER WAND RF STERILE (DRAPES) ×4 IMPLANT
DRAPE INCISE 51X51 W/FILM STRL (DRAPES) ×4 IMPLANT
DRAPE OPHTHALMIC 77X100 STRL (CUSTOM PROCEDURE TRAY) ×2 IMPLANT
DRAPE RETRACTOR (MISCELLANEOUS) ×4 IMPLANT
ERASER HMR WETFIELD 23G BP (MISCELLANEOUS) IMPLANT
FILTER BLUE MILLIPORE (MISCELLANEOUS) IMPLANT
FILTER STRAW FLUID ASPIR (MISCELLANEOUS) IMPLANT
FORCEPS GRIESHABER ILM 25G A (INSTRUMENTS) IMPLANT
GAS AUTO FILL CONSTEL (OPHTHALMIC) ×4
GAS AUTO FILL CONSTELLATION (OPHTHALMIC) ×1 IMPLANT
GAS IO SF6 125GM ISPAN CNSTL (MISCELLANEOUS) ×1 IMPLANT
GAS TANK CONSTELL (MISCELLANEOUS) ×4
GAS WRIST BAND GREEN (MISCELLANEOUS)
GLOVE SURG SYN 7.5  E (GLOVE) ×4
GLOVE SURG SYN 7.5 E (GLOVE) ×3 IMPLANT
GLOVE SURG SYN 7.5 PF PI (GLOVE) ×2 IMPLANT
GOWN STRL REUS W/ TWL LRG LVL3 (GOWN DISPOSABLE) ×6 IMPLANT
GOWN STRL REUS W/TWL LRG LVL3 (GOWN DISPOSABLE) ×8
HANDLE PNEUMATIC FOR CONSTEL (OPHTHALMIC) IMPLANT
KIT BASIN OR (CUSTOM PROCEDURE TRAY) ×4 IMPLANT
KIT PERFLUORON PROCEDURE 5ML (MISCELLANEOUS) IMPLANT
KIT TURNOVER KIT B (KITS) ×4 IMPLANT
KNIFE CRESCENT 1.75 EDGEAHEAD (BLADE) IMPLANT
KNIFE GRIESHABER SHARP 2.5MM (MISCELLANEOUS) ×4 IMPLANT
LENS BIOM SUPER VIEW SET DISP (MISCELLANEOUS) ×4 IMPLANT
MICROPICK 25G (MISCELLANEOUS)
NDL 18GX1X1/2 (RX/OR ONLY) (NEEDLE) ×2 IMPLANT
NDL 25GX 5/8IN NON SAFETY (NEEDLE) ×2 IMPLANT
NDL 27GX1/2 REG BEVEL ECLIP (NEEDLE) ×2 IMPLANT
NDL FILTER BLUNT 18X1 1/2 (NEEDLE) ×2 IMPLANT
NDL HYPO 25GX1X1/2 BEV (NEEDLE) ×2 IMPLANT
NDL HYPO 30X.5 LL (NEEDLE) ×2 IMPLANT
NDL RETROBULBAR 25GX1.5 (NEEDLE) ×2 IMPLANT
NEEDLE 18GX1X1/2 (RX/OR ONLY) (NEEDLE) ×4 IMPLANT
NEEDLE 25GX 5/8IN NON SAFETY (NEEDLE) ×4 IMPLANT
NEEDLE 27GX1/2 REG BEVEL ECLIP (NEEDLE) ×4 IMPLANT
NEEDLE FILTER BLUNT 18X 1/2SAF (NEEDLE) ×1
NEEDLE FILTER BLUNT 18X1 1/2 (NEEDLE) ×3 IMPLANT
NEEDLE HYPO 25GX1X1/2 BEV (NEEDLE) ×4 IMPLANT
NEEDLE HYPO 30X.5 LL (NEEDLE) ×4 IMPLANT
NEEDLE RETROBULBAR 25GX1.5 (NEEDLE) ×4 IMPLANT
NS IRRIG 1000ML POUR BTL (IV SOLUTION) ×4 IMPLANT
PACK VITRECTOMY CUSTOM (CUSTOM PROCEDURE TRAY) ×4 IMPLANT
PAD ARMBOARD 7.5X6 YLW CONV (MISCELLANEOUS) ×8 IMPLANT
PAK PIK VITRECTOMY CVS 25GA (OPHTHALMIC) ×4 IMPLANT
PICK MICROPICK 25G (MISCELLANEOUS) IMPLANT
PROBE LASER ILLUM FLEX CVD 25G (OPHTHALMIC) ×2 IMPLANT
REPL STRA BRUSH NDL (NEEDLE) IMPLANT
REPL STRA BRUSH NEEDLE (NEEDLE) IMPLANT
RESERVOIR BACK FLUSH (MISCELLANEOUS) IMPLANT
ROLLS DENTAL (MISCELLANEOUS) ×8 IMPLANT
SCRAPER DIAMOND 25GA (OPHTHALMIC RELATED) IMPLANT
SET INJECTOR OIL FLUID CONSTEL (OPHTHALMIC) ×2 IMPLANT
SHEET MEDIUM DRAPE 40X70 STRL (DRAPES) ×4 IMPLANT
SOL ANTI FOG 6CC (MISCELLANEOUS) ×3 IMPLANT
SOLUTION ANTI FOG 6CC (MISCELLANEOUS) ×1
SPONGE SURGIFOAM ABS GEL 12-7 (HEMOSTASIS) IMPLANT
STOPCOCK 4 WAY LG BORE MALE ST (IV SETS) IMPLANT
SUT CHROMIC 7 0 TG140 8 (SUTURE) IMPLANT
SUT ETHILON 10 0 BV100 4 (SUTURE) ×2 IMPLANT
SUT ETHILON 10 0 CS140 6 (SUTURE) ×2 IMPLANT
SUT ETHILON 9 0 TG140 8 (SUTURE) IMPLANT
SUT MERSILENE 4 0 RV 2 (SUTURE) IMPLANT
SUT SILK 2 0 (SUTURE) ×4
SUT SILK 2-0 18XBRD TIE 12 (SUTURE) ×1 IMPLANT
SUT SILK 4 0 RB 1 (SUTURE) IMPLANT
SUT VICRYL 8 0 TG140 8 (SUTURE) ×2 IMPLANT
SYR 10ML LL (SYRINGE) ×4 IMPLANT
SYR 20ML LL LF (SYRINGE) IMPLANT
SYR 5ML LL (SYRINGE) ×4 IMPLANT
SYR BULB EAR ULCER 3OZ GRN STR (SYRINGE) ×4 IMPLANT
SYR TB 1ML LUER SLIP (SYRINGE) IMPLANT
TUBING HIGH PRESS EXTEN 6IN (TUBING) IMPLANT
WATER STERILE IRR 1000ML POUR (IV SOLUTION) ×4 IMPLANT
WIPE INSTRUMENT VISIWIPE 73X73 (MISCELLANEOUS) IMPLANT

## 2020-12-05 NOTE — Progress Notes (Signed)
Per Dr. Tobias Alexander, patient received one breathing treatment Albuterol for wheezing with good effect.

## 2020-12-05 NOTE — Anesthesia Preprocedure Evaluation (Addendum)
Anesthesia Evaluation  Patient identified by MRN, date of birth, ID band Patient awake    Reviewed: Allergy & Precautions, NPO status , Patient's Chart, lab work & pertinent test results  History of Anesthesia Complications Negative for: history of anesthetic complications  Airway Mallampati: II  TM Distance: >3 FB Neck ROM: Full    Dental  (+) Edentulous Upper, Dental Advisory Given   Pulmonary Current Smoker, former smoker,  Squamous cell Ca Right lower lobe   Pulmonary exam normal breath sounds clear to auscultation       Cardiovascular hypertension, Pt. on medications and Pt. on home beta blockers Normal cardiovascular exam Rhythm:Regular Rate:Normal  EKG 06/24/20 NSR, poor R wave progression   Neuro/Psych PSYCHIATRIC DISORDERS Diabetic neuropathy    GI/Hepatic GERD  Medicated and Controlled,(+)     substance abuse  alcohol use,   Endo/Other  diabetes, Well Controlled, Type 2, Insulin DependentHyperlipidemia  Renal/GU Renal InsufficiencyRenal disease  negative genitourinary   Musculoskeletal  (+) Arthritis , Psoriasis   Abdominal   Peds  Hematology negative hematology ROS (+)   Anesthesia Other Findings   Reproductive/Obstetrics                            Anesthesia Physical  Anesthesia Plan  ASA: III  Anesthesia Plan: MAC   Post-op Pain Management:    Induction:   PONV Risk Score and Plan: 3 and Treatment may vary due to age or medical condition, Ondansetron, Midazolam and Propofol infusion  Airway Management Planned: Natural Airway and Nasal Cannula  Additional Equipment:   Intra-op Plan:   Post-operative Plan:   Informed Consent: I have reviewed the patients History and Physical, chart, labs and discussed the procedure including the risks, benefits and alternatives for the proposed anesthesia with the patient or authorized representative who has indicated his/her  understanding and acceptance.     Dental advisory given  Plan Discussed with: CRNA, Anesthesiologist and Surgeon  Anesthesia Plan Comments:        Anesthesia Quick Evaluation

## 2020-12-05 NOTE — Brief Op Note (Signed)
12/05/2020  3:49 PM  PATIENT:  [name]  65 y.o. 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  PROCEDURE:  Procedure(s): PARS PLANA VITRECTOMY WITH 25 GAUGE (Right) SILICON OIL REMOVAL (Right) AIR/FLUID EXCHANGE (Right) INSERTION OF GAS PHOTOCOAGULATION WITH LASER  SURGEON:  Surgeon(s) and Role:    * Jalene Mullet, MD - Primary  PHYSICIAN ASSISTANT:   ASSISTANTS: none   ANESTHESIA:   local and general  EBL:  0 mL   BLOOD ADMINISTERED:none  DRAINS: none   LOCAL MEDICATIONS USED:  MARCAINE    and LIDOCAINE   SPECIMEN:  No Specimen  DISPOSITION OF SPECIMEN:  N/A  COUNTS:  YES  TOURNIQUET:  * No tourniquets in log *  DICTATION: .Note written in EPIC  PLAN OF CARE: Discharge to home after PACU  PATIENT DISPOSITION:  PACU - hemodynamically stable.   Delay start of Pharmacological VTE agent (>24hrs) due to surgical blood loss or risk of bleeding: not applicable

## 2020-12-05 NOTE — Anesthesia Postprocedure Evaluation (Signed)
Anesthesia Post Note  Patient: [name]  Procedure(s) Performed: PARS PLANA VITRECTOMY WITH 25 GAUGE (Right Eye) SILICON OIL REMOVAL (Right Eye) AIR/FLUID EXCHANGE (Right Eye) INSERTION OF GAS (Eye) PHOTOCOAGULATION WITH LASER (Eye)     Patient location during evaluation: PACU Anesthesia Type: MAC Level of consciousness: awake and alert Pain management: pain level controlled Vital Signs Assessment: post-procedure vital signs reviewed and stable Respiratory status: spontaneous breathing, nonlabored ventilation, respiratory function stable and patient connected to nasal cannula oxygen Cardiovascular status: stable and blood pressure returned to baseline Postop Assessment: no apparent nausea or vomiting Anesthetic complications: no   No complications documented.  Last Vitals:  Vitals:   12/05/20 1629 12/05/20 1644  BP: (!) 135/92 (!) 150/83  Pulse: 77 68  Resp: 20 17  Temp:    SpO2: 100% 99%    Last Pain:  Vitals:   12/05/20 1644  TempSrc:   PainSc: 0-No pain                 Charae Depaolis COKER

## 2020-12-05 NOTE — Anesthesia Procedure Notes (Signed)
Procedure Name: Intubation Date/Time: 12/05/2020 2:32 PM Performed by: Thelma Comp, CRNA Pre-anesthesia Checklist: Patient identified, Emergency Drugs available, Suction available and Patient being monitored Patient Re-evaluated:Patient Re-evaluated prior to induction Oxygen Delivery Method: Circle System Utilized Preoxygenation: Pre-oxygenation with 100% oxygen Induction Type: IV induction Ventilation: Mask ventilation without difficulty Laryngoscope Size: Mac and 4 Grade View: Grade I Tube type: Oral Tube size: 7.5 mm Number of attempts: 1 Airway Equipment and Method: Stylet Placement Confirmation: ETT inserted through vocal cords under direct vision,  positive ETCO2 and breath sounds checked- equal and bilateral Secured at: 23 cm Tube secured with: Tape Dental Injury: Teeth and Oropharynx as per pre-operative assessment

## 2020-12-05 NOTE — Transfer of Care (Signed)
Immediate Anesthesia Transfer of Care Note  Patient: [name]  Procedure(s) Performed: PARS PLANA VITRECTOMY WITH 25 GAUGE (Right Eye) SILICON OIL REMOVAL (Right Eye) AIR/FLUID EXCHANGE (Right Eye) INSERTION OF GAS (Eye) PHOTOCOAGULATION WITH LASER (Eye)  Patient Location: PACU  Anesthesia Type:GA combined with regional for post-op pain  Level of Consciousness: drowsy  Airway & Oxygen Therapy: Patient Spontanous Breathing and Patient connected to face mask oxygen  Post-op Assessment: Report given to RN and Post -op Vital signs reviewed and stable  Post vital signs: Reviewed and stable  Last Vitals:  Vitals Value Taken Time  BP 134/83 12/05/20 1559  Temp 36.5 C 12/05/20 1559  Pulse 99 12/05/20 1601  Resp 21 12/05/20 1602  SpO2 98 % 12/05/20 1601  Vitals shown include unvalidated device data.  Last Pain:  Vitals:   12/05/20 1316  TempSrc:   PainSc: 9          Complications: No complications documented.

## 2020-12-05 NOTE — Discharge Instructions (Addendum)
DO NOT SLEEP ON BACK, THE EYE PRESSURE CAN GO UP AND CAUSE VISION LOSS   SLEEP ON SIDE WITH NOSE TO PILLOW  DURING DAY KEEP FACE DOWN.  15 MINUTES EVERY 2 HOURS IT IS OK TO LOOK STRAIGHT AHEAD (USE BATHROOM, EAT, WALK, ETC.)

## 2020-12-05 NOTE — H&P (Signed)
Date of examination:  12/04/20  Indication for surgery: right eye pain and ocular hypertension  Pertinent past medical history:  Past Medical History:  Diagnosis Date  . Acid reflux   . Arthritis   . Diabetes mellitus   . Diverticulosis 2014   seen on colonoscopy  . Drug-induced skin rash 03/17/2017  . Encounter for antineoplastic chemotherapy 10/09/2016  . Encounter for antineoplastic immunotherapy 12/21/2016  . Encounter for smoking cessation counseling 08/10/2016  . GERD (gastroesophageal reflux disease)   . Goals of care, counseling/discussion 10/09/2016  . Gout   . History of kidney stones   . Hyperlipidemia   . Hypertension   . Incarcerated ventral hernia   . Mouth bleeding    upper teeth right back side loose and bleed at night  . Pancreatitis   . Rectal bleeding    "intermittently for years"  . Stage III squamous cell carcinoma of right lung ([place]) 10/07/2016    Pertinent ocular history:  Retinal detachment right eye  Pertinent family history:  Family History  Problem Relation Age of Onset  . Diabetes Mother   . Cancer Father   . Multiple sclerosis Sister   . Heart failure Brother     General:  Healthy appearing patient in no distress.    Eyes:    Acuity OD CF   External: Within normal limits     Anterior segment: silicone oil in anterior chamber    Fundus: attached retina, good SB effect, laser scars OD     Impression:  right eye pain and ocular hypertension  Plan: Pars plana vitrectomy, removal of silicone oil and SF6 gas tamponade right eye  Jalene Mullet, MD

## 2020-12-08 ENCOUNTER — Encounter (HOSPITAL_COMMUNITY): Payer: Self-pay | Admitting: Ophthalmology

## 2020-12-08 NOTE — Anesthesia Postprocedure Evaluation (Signed)
Anesthesia Post Note  Patient: [name]  Procedure(s) Performed: PARS PLANA VITRECTOMY WITH 25 GAUGE (Right Eye) SILICON OIL REMOVAL (Right Eye) AIR/FLUID EXCHANGE (Right Eye) INSERTION OF GAS (Eye) PHOTOCOAGULATION WITH LASER (Eye)     Patient location during evaluation: PACU Anesthesia Type: MAC Level of consciousness: awake and alert Pain management: pain level controlled Vital Signs Assessment: post-procedure vital signs reviewed and stable Respiratory status: spontaneous breathing, nonlabored ventilation, respiratory function stable and patient connected to nasal cannula oxygen Cardiovascular status: blood pressure returned to baseline and stable Postop Assessment: no apparent nausea or vomiting Anesthetic complications: no   No complications documented.  Last Vitals:  Vitals:   12/05/20 1644 12/05/20 1659  BP: (!) 150/83 (!) 152/86  Pulse: 68 71  Resp: 17 16  Temp:  36.6 C  SpO2: 99% 100%    Last Pain:  Vitals:   12/05/20 1659  TempSrc:   PainSc: 0-No pain                 Kinzi Frediani COKER

## 2020-12-10 ENCOUNTER — Other Ambulatory Visit: Payer: Self-pay

## 2020-12-10 NOTE — Patient Outreach (Addendum)
[facility]) Care Management  [place]  12/10/2020   [name] 1956/05/03 [id number]  Subjective: Telephone to patient for Join EMMI follow up. Discussed [facility] Care Management services.  Patient agreeable to [facility] telephonic outreach for disease management.   Medical history is significant for HTN, type 2 diabetes mellitus (A1c 4.2,on diet control), GERD, Gout , alcohol abuse, stage IIINonsmall cell squamous cell carcinoma of the right lung. He reports being monitored by oncologist every 6 months.   Patient reports he monitors sugars every now and then. He is diet controlled diabetes.  He does not check his blood pressure as he does not have a monitor. CM will order a monitor.  CM encouraged patient to check blood pressure at least weekly  and record.  He verbalized understanding.    Patient is independent with care and lives in the home with his nephew.  He does not drive but uses bus transportation to get to appointments.  Discussed with patient [employer] transportation benefit as well.  Reviewed medications. He voices no questions or concerns about medications.     Objective:   Encounter Medications:  Outpatient Encounter Medications as of 12/10/2020  Medication Sig  . acetaminophen (TYLENOL) 500 MG tablet Take 500 mg by mouth every 6 (six) hours as needed for moderate pain.  [name] allopurinol (ZYLOPRIM) 300 MG tablet TAKE 1 TABLET EVERY DAY  . Apremilast 30 MG TABS Take 30 mg by mouth 2 (two) times daily.  [name] atorvastatin (LIPITOR) 40 MG tablet TAKE 1 TABLET BY MOUTH EVERY DAY AT 6PM  . Blood Glucose Monitoring Suppl (BAYER CONTOUR NEXT MONITOR) w/Device KIT Use as directed  . buPROPion (WELLBUTRIN XL) 150 MG 24 hr tablet TAKE 1 TABLET EVERY DAY  . carvedilol (COREG) 3.125 MG tablet TAKE 1 TABLET TWICE DAILY WITH A MEAL (STOP AMLODIPINE)  . cetirizine (ZYRTEC) 10 MG tablet TAKE 1 TABLET(10 MG) BY MOUTH DAILY  . coal tar (NEUTROGENA T-GEL) 0.5 % shampoo Apply 1  application topically daily.  . fluticasone (FLONASE) 50 MCG/ACT nasal spray SHAKE LIQUID AND USE 1 SPRAY IN EACH NOSTRIL DAILY (Patient taking differently: Place 1 spray into both nostrils daily.)  . gabapentin (NEURONTIN) 300 MG capsule Take 2 capsules (600 mg total) by mouth at bedtime.  [name] glucose blood (BAYER CONTOUR NEXT TEST) test strip Use as instructed  . hydrocortisone (ANUSOL-HC) 25 MG suppository Place 1 suppository (25 mg total) rectally 2 (two) times daily.  . hydroxypropyl methylcellulose / hypromellose (ISOPTO TEARS / GONIOVISC) 2.5 % ophthalmic solution Place 1 drop into both eyes 3 (three) times daily as needed for dry eyes.  [name] lidocaine (XYLOCAINE) 2 % solution Use as directed 15 mLs in the mouth or throat as needed for mouth pain. Swish and spit 72ms as needed for dental pain  . MITIGARE 0.6 MG CAPS TAKE 2 CAPSULES BY MOUTH AT THE START OF GOUT ATTACK, MAY TAKE 1 CAPSULE AFTERWARDS IF SYMPTOMS PERSIST (Patient taking differently: Take 0.6 mg by mouth daily.)  . Omega-3 Fatty Acids (FISH OIL) 1200 MG CAPS Take 1,200 mg by mouth daily.   .Marland Kitchenomeprazole (PRILOSEC) 20 MG capsule TAKE 1 CAPSULE(20 MG) BY MOUTH DAILY  . oxyCODONE (OXY IR/ROXICODONE) 5 MG immediate release tablet Take 1 tablet (5 mg total) by mouth every 6 (six) hours as needed for severe pain.  . sildenafil (VIAGRA) 50 MG tablet Take 1 tablet (50 mg total) by mouth daily as needed for erectile dysfunction. At least 24 hours between doses  .  thiamine (VITAMIN B-1) 100 MG tablet Take 100 mg by mouth daily.  [name] triamcinolone cream (KENALOG) 0.1 % Apply 1 application topically 2 (two) times daily.  . Insulin Pen Needle (PEN NEEDLES) 31G X 6 MM MISC Use lantus every night at hours of sleep (Patient not taking: Reported on 12/10/2020)  . OVER THE COUNTER MEDICATION Take 250 mg by mouth daily. Super Beta Prostate (Patient not taking: Reported on 12/10/2020)   No facility-administered encounter medications on file as of 12/10/2020.     Functional Status:  In your present state of health, do you have any difficulty performing the following activities: 12/10/2020 12/05/2020  Hearing? Y -  Comment some Loco? Y -  Comment recent cataract surgery -  Difficulty concentrating or making decisions? N -  Walking or climbing stairs? N -  Dressing or bathing? N -  Doing errands, shopping? N N  Preparing Food and eating ? N -  Using the Toilet? N -  In the past six months, have you accidently leaked urine? N -  Do you have problems with loss of bowel control? N -  Managing your Medications? N -  Managing your Finances? N -  Housekeeping or managing your Housekeeping? N -  Some recent data might be hidden    Fall/Depression Screening: Fall Risk  05/21/2020 01/15/2019 04/26/2018  Falls in the past year? 0 0 No  Number falls in past yr: 0 - -  Risk for fall due to : - - -   PHQ 2/9 Scores 12/10/2020 05/21/2020 07/16/2019 01/15/2019 08/09/2018 04/26/2018 01/04/2017  PHQ - 2 Score 0 0 0 0 0 5 0  PHQ- 9 Score - 0 0 0 - 9 -    Assessment:   Care Plan Care Plan : Hypertension (Adult)  Updates made by Jon Billings, RN since 12/10/2020 12:00 AM    Problem: Hypertension (Hypertension)     Long-Range Goal: Hypertension Monitored as evidenced by blood pressure less than 140/80.   Start Date: 12/10/2020  Expected End Date: 07/04/2021  Priority: High  Note:   Evidence-based guidance:   Promote initial use of ambulatory blood pressure measurements (for 3 days) to rule out "white-coat" effect; identify masked hypertension and presence or absence of nocturnal "dipping" of blood pressure.    Encourage continued use of home blood pressure monitoring and recording in blood pressure log; include symptoms of hypotension or potential medication side effects in log.  Notes:    Task: Identify and Monitor Blood Pressure Elevation   Due Date: 07/04/2021  Priority: Routine  Responsible User: Jon Billings, RN  Note:   Care Management  Activities:    - home or ambulatory blood pressure monitoring encouraged    Notes: 12/10/20 Blood pressure cuff ordered     Goals Addressed            This Visit's Progress   . Track and Manage My Blood Pressure-Hypertension       Barriers: Health Behaviors Knowledge Timeframe:  Long-Range Goal Priority:  High Start Date: 12/10/20                             Expected End Date:    07/04/21                   Follow Up Date 02/01/21    - check blood pressure weekly - write blood pressure results in a log or diary  Why is this important?    You won't feel high blood pressure, but it can still hurt your blood vessels.   High blood pressure can cause heart or kidney problems. It can also cause a stroke.   Making lifestyle changes like losing a little weight or eating less salt will help.   Checking your blood pressure at home and at different times of the day can help to control blood pressure.   If the doctor prescribes medicine remember to take it the way the doctor ordered.   Call the office if you cannot afford the medicine or if there are questions about it.     Notes: 12/10/20 Patient does not have blood pressure cuff to monitor pressure.  Ordered blood pressure cuff.  Discussed hypertension management: Take medications as ordered Limit salt intake. Eat plenty of fruits and vegetables Remain as active as possible Take blood pressure at least weekly at home and record in a notebook to take to doctors visits       Plan: RN CM will provide ongoing education and support to patient through phone calls.   RN CM will send welcome packet with consent to patient.   RN CM will send initial barriers letter, assessment, and care plan to primary care physician.   RN CM will contact patient next month. Follow-up: Patient agrees to Care Plan and Follow-up. Follow-up in 4-6 week(s)   [name], RN, MSN [facility] Care Management Coordinator Direct Line  [phone] Cell [phone] Toll Free: [phone]  Fax: [phone]

## 2020-12-10 NOTE — Patient Instructions (Addendum)
Goals Addressed            This Visit's Progress   . Track and Manage My Blood Pressure-Hypertension       Barriers: Health Behaviors Knowledge Timeframe:  Long-Range Goal Priority:  High Start Date: 12/10/20                             Expected End Date:    07/04/21                   Follow Up Date 02/01/21    - check blood pressure weekly - write blood pressure results in a log or diary    Why is this important?    You won't feel high blood pressure, but it can still hurt your blood vessels.   High blood pressure can cause heart or kidney problems. It can also cause a stroke.   Making lifestyle changes like losing a little weight or eating less salt will help.   Checking your blood pressure at home and at different times of the day can help to control blood pressure.   If the doctor prescribes medicine remember to take it the way the doctor ordered.   Call the office if you cannot afford the medicine or if there are questions about it.     Notes: 12/10/20 Patient does not have blood pressure cuff to monitor pressure.  Ordered blood pressure cuff.  Discussed hypertension management: Take medications as ordered Limit salt intake. Eat plenty of fruits and vegetables Remain as active as possible Take blood pressure at least weekly at home and record in a notebook to take to doctors visits       Hypertension, Adult Hypertension is another name for high blood pressure. High blood pressure forces your heart to work harder to pump blood. This can cause problems over time. There are two numbers in a blood pressure reading. There is a top number (systolic) over a bottom number (diastolic). It is best to have a blood pressure that is below 120/80. Healthy choices can help lower your blood pressure, or you may need medicine to help lower it. What are the causes? The cause of this condition is not known. Some conditions may be related to high blood pressure. What increases the  risk?  Smoking.  Having type 2 diabetes mellitus, high cholesterol, or both.  Not getting enough exercise or physical activity.  Being overweight.  Having too much fat, sugar, calories, or salt (sodium) in your diet.  Drinking too much alcohol.  Having long-term (chronic) kidney disease.  Having a family history of high blood pressure.  Age. Risk increases with age.  Race. You may be at higher risk if you are African American.  Gender. Men are at higher risk than women before age 68. After age 62, women are at higher risk than men.  Having obstructive sleep apnea.  Stress. What are the signs or symptoms?  High blood pressure may not cause symptoms. Very high blood pressure (hypertensive crisis) may cause: ? Headache. ? Feelings of worry or nervousness (anxiety). ? Shortness of breath. ? Nosebleed. ? A feeling of being sick to your stomach (nausea). ? Throwing up (vomiting). ? Changes in how you see. ? Very bad chest pain. ? Seizures. How is this treated?  This condition is treated by making healthy lifestyle changes, such as: ? Eating healthy foods. ? Exercising more. ? Drinking less alcohol.  Your health  care provider may prescribe medicine if lifestyle changes are not enough to get your blood pressure under control, and if: ? Your top number is above 130. ? Your bottom number is above 80.  Your personal target blood pressure may vary. Follow these instructions at home: Eating and drinking  If told, follow the DASH eating plan. To follow this plan: ? Fill one half of your plate at each meal with fruits and vegetables. ? Fill one fourth of your plate at each meal with whole grains. Whole grains include whole-wheat pasta, brown rice, and whole-grain bread. ? Eat or drink low-fat dairy products, such as skim milk or low-fat yogurt. ? Fill one fourth of your plate at each meal with low-fat (lean) proteins. Low-fat proteins include fish, chicken without skin,  eggs, beans, and tofu. ? Avoid fatty meat, cured and processed meat, or chicken with skin. ? Avoid pre-made or processed food.  Eat less than 1,500 mg of salt each day.  Do not drink alcohol if: ? Your doctor tells you not to drink. ? You are pregnant, may be pregnant, or are planning to become pregnant.  If you drink alcohol: ? Limit how much you use to:  0-1 drink a day for women.  0-2 drinks a day for men. ? Be aware of how much alcohol is in your drink. In the [place] one drink equals one 12 oz bottle of beer (355 mL), one 5 oz glass of wine (148 mL), or one 1 oz glass of hard liquor (44 mL).   Lifestyle  Work with your doctor to stay at a healthy weight or to lose weight. Ask your doctor what the best weight is for you.  Get at least 30 minutes of exercise most days of the week. This may include walking, swimming, or biking.  Get at least 30 minutes of exercise that strengthens your muscles (resistance exercise) at least 3 days a week. This may include lifting weights or doing Pilates.  Do not use any products that contain nicotine or tobacco, such as cigarettes, e-cigarettes, and chewing tobacco. If you need help quitting, ask your doctor.  Check your blood pressure at home as told by your doctor.  Keep all follow-up visits as told by your doctor. This is important.   Medicines  Take over-the-counter and prescription medicines only as told by your doctor. Follow directions carefully.  Do not skip doses of blood pressure medicine. The medicine does not work as well if you skip doses. Skipping doses also puts you at risk for problems.  Ask your doctor about side effects or reactions to medicines that you should watch for. Contact a doctor if you:  Think you are having a reaction to the medicine you are taking.  Have headaches that keep coming back (recurring).  Feel dizzy.  Have swelling in your ankles.  Have trouble with your vision. Get help right away if  you:  Get a very bad headache.  Start to feel mixed up (confused).  Feel weak or numb.  Feel faint.  Have very bad pain in your: ? Chest. ? Belly (abdomen).  Throw up more than once.  Have trouble breathing. Summary  Hypertension is another name for high blood pressure.  High blood pressure forces your heart to work harder to pump blood.  For most people, a normal blood pressure is less than 120/80.  Making healthy choices can help lower blood pressure. If your blood pressure does not get lower with healthy choices, you  may need to take medicine. This information is not intended to replace advice given to you by your health care provider. Make sure you discuss any questions you have with your health care provider. Document Revised: 03/01/2018 Document Reviewed: 03/01/2018 Elsevier Patient Education  2021 [employer].

## 2020-12-24 DIAGNOSIS — K648 Other hemorrhoids: Secondary | ICD-10-CM | POA: Insufficient documentation

## 2020-12-24 NOTE — Op Note (Signed)
Cedar Ditullio 12/05/2020  Diagnosis: Complications of silicone oil with ocular hypertension and right eye pain  Procedure: [place] Vitrectomy, Endolaser, Fluid Gas Exchange, and removal of silicone oil right eye Operative Eye:  right eye  Surgeon: Royston Cowper Estimated Blood Loss: minimal Specimens for Pathology:  None Complications: none   The  patient was prepped and draped in the usual fashion for ocular surgery on the  right eye .  A lid speculum was placed.  Infusion line and trocar was placed at the 8 o'clock position approximately 3.5 mm from the surgical limbus.   The infusion line was allowed to run and then clamped when placed at the cannula opening. The line was inserted and secured to the drape with an adhesive strip.   Active trocars/cannula were placed at the 10 and 2 o'clock positions approximately 3.5 mm from the surgical limbus. The cannula was visualized in the vitreous cavity.  The silicone oil extraction kit was used to remove silicone oil from the posterior chamber.  A paracentesis would was made with a 49m blade and silicone oil was irrigated from the anterior chamber.  The light pipe and vitreous cutter were inserted into the vitreous cavity and the remaining silicone oil was removed.    3 rows of endolaser were applied 360 degrees to the periphery and surrounding the inferior retinectomy.  A complete air-fluid exchange was performed.  14% SF6 gas was placed in the eye.  The superior cannulas were sequentially removed with concommitant tamponade using a cotton tipped applicator and noted to be air tight.  The infusion line and trocar were removed and the sclerotomy was noted to be air tight with normal intraocular pressure by digital palpapation.  Subconjunctival injections of  Dexamethasone 471m1ml was placed in the infero-medial quadrant.   The speculum and drapes were removed and the eye was patched with Polymixin/Bacitracin ophthalmic ointment. An eye  shield was placed and the patient was transferred alert and conversant with stable vital signs to the post operative recovery area.  The patient tolerated the procedure well and no complications were noted.  NaRoyston CowperD

## 2020-12-25 ENCOUNTER — Ambulatory Visit (INDEPENDENT_AMBULATORY_CARE_PROVIDER_SITE_OTHER): Payer: Medicare HMO | Admitting: Family Medicine

## 2020-12-25 ENCOUNTER — Ambulatory Visit: Payer: Medicare HMO | Admitting: Family Medicine

## 2020-12-25 ENCOUNTER — Other Ambulatory Visit: Payer: Self-pay

## 2020-12-25 ENCOUNTER — Encounter: Payer: Self-pay | Admitting: Family Medicine

## 2020-12-25 VITALS — BP 136/87 | HR 86 | Temp 98.2°F | Resp 16 | Ht 74.02 in | Wt 176.6 lb

## 2020-12-25 DIAGNOSIS — J439 Emphysema, unspecified: Secondary | ICD-10-CM | POA: Insufficient documentation

## 2020-12-25 DIAGNOSIS — E1169 Type 2 diabetes mellitus with other specified complication: Secondary | ICD-10-CM | POA: Diagnosis not present

## 2020-12-25 DIAGNOSIS — J438 Other emphysema: Secondary | ICD-10-CM

## 2020-12-25 DIAGNOSIS — K068 Other specified disorders of gingiva and edentulous alveolar ridge: Secondary | ICD-10-CM

## 2020-12-25 DIAGNOSIS — E1159 Type 2 diabetes mellitus with other circulatory complications: Secondary | ICD-10-CM

## 2020-12-25 DIAGNOSIS — M1A9XX0 Chronic gout, unspecified, without tophus (tophi): Secondary | ICD-10-CM | POA: Diagnosis not present

## 2020-12-25 DIAGNOSIS — E1149 Type 2 diabetes mellitus with other diabetic neurological complication: Secondary | ICD-10-CM

## 2020-12-25 DIAGNOSIS — I700 Atherosclerosis of aorta: Secondary | ICD-10-CM

## 2020-12-25 DIAGNOSIS — S0083XA Contusion of other part of head, initial encounter: Secondary | ICD-10-CM | POA: Diagnosis not present

## 2020-12-25 DIAGNOSIS — K219 Gastro-esophageal reflux disease without esophagitis: Secondary | ICD-10-CM

## 2020-12-25 DIAGNOSIS — K7460 Unspecified cirrhosis of liver: Secondary | ICD-10-CM | POA: Insufficient documentation

## 2020-12-25 DIAGNOSIS — I152 Hypertension secondary to endocrine disorders: Secondary | ICD-10-CM

## 2020-12-25 DIAGNOSIS — E785 Hyperlipidemia, unspecified: Secondary | ICD-10-CM

## 2020-12-25 DIAGNOSIS — K7030 Alcoholic cirrhosis of liver without ascites: Secondary | ICD-10-CM

## 2020-12-25 DIAGNOSIS — F17200 Nicotine dependence, unspecified, uncomplicated: Secondary | ICD-10-CM

## 2020-12-25 DIAGNOSIS — F1010 Alcohol abuse, uncomplicated: Secondary | ICD-10-CM

## 2020-12-25 LAB — POCT GLYCOSYLATED HEMOGLOBIN (HGB A1C): Hemoglobin A1C: 4.9 % (ref 4.0–5.6)

## 2020-12-25 LAB — GLUCOSE, POCT (MANUAL RESULT ENTRY): POC Glucose: 98 mg/dl (ref 70–99)

## 2020-12-25 MED ORDER — OMEPRAZOLE 20 MG PO CPDR: DELAYED_RELEASE_CAPSULE | ORAL | 1 refills | Status: DC

## 2020-12-25 MED ORDER — GABAPENTIN 300 MG PO CAPS: 600.0000 mg | ORAL_CAPSULE | Freq: Every day | ORAL | 1 refills | Status: DC

## 2020-12-25 MED ORDER — ATORVASTATIN CALCIUM 40 MG PO TABS: 40.0000 mg | ORAL_TABLET | Freq: Every day | ORAL | 1 refills | Status: DC

## 2020-12-25 MED ORDER — AMLODIPINE BESYLATE 10 MG PO TABS
ORAL_TABLET | ORAL | 1 refills | Status: DC
Start: 2020-12-25 — End: 2021-07-02

## 2020-12-25 MED ORDER — AMOXICILLIN 500 MG PO CAPS: 500.0000 mg | ORAL_CAPSULE | Freq: Three times a day (TID) | ORAL | 0 refills | Status: AC

## 2020-12-25 MED ORDER — CARVEDILOL 3.125 MG PO TABS: ORAL_TABLET | ORAL | 1 refills | Status: DC

## 2020-12-25 MED ORDER — ALLOPURINOL 300 MG PO TABS: 300.0000 mg | ORAL_TABLET | Freq: Every day | ORAL | 6 refills | Status: DC

## 2020-12-25 MED ORDER — VARENICLINE TARTRATE 0.5 MG X 11 & 1 MG X 42 PO MISC: ORAL | 2 refills | Status: DC

## 2020-12-25 MED ORDER — NALTREXONE HCL 50 MG PO TABS: 50.0000 mg | ORAL_TABLET | Freq: Every day | ORAL | 6 refills | Status: DC

## 2020-12-25 NOTE — Progress Notes (Signed)
Pt presents for diabetes 6 month f/u Other concerns include pt stated he was eating bone fish on Saturday and he thinks a bone is stuck in gums causing pain when eating

## 2020-12-25 NOTE — Patient Instructions (Signed)
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
## 2020-12-26 DIAGNOSIS — H31011 Macula scars of posterior pole (postinflammatory) (post-traumatic), right eye: Secondary | ICD-10-CM | POA: Diagnosis not present

## 2020-12-26 DIAGNOSIS — H35371 Puckering of macula, right eye: Secondary | ICD-10-CM | POA: Diagnosis not present

## 2020-12-26 DIAGNOSIS — H50011 Monocular esotropia, right eye: Secondary | ICD-10-CM | POA: Diagnosis not present

## 2020-12-26 LAB — LIPID PANEL
Chol/HDL Ratio: 4.2 ratio (ref 0.0–5.0)
Cholesterol, Total: 160 mg/dL (ref 100–199)
HDL: 38 mg/dL — ABNORMAL LOW (ref >39)
LDL Chol Calc (NIH): 53 mg/dL (ref 0–99)
Triglycerides: 462 mg/dL — ABNORMAL HIGH (ref 0–149)
VLDL Cholesterol Cal: 69 mg/dL — ABNORMAL HIGH (ref 5–40)

## 2020-12-26 LAB — MICROALBUMIN / CREATININE URINE RATIO
Creatinine, Urine: 175.8 mg/dL
Microalb/Creat Ratio: 54 mg/g creat — ABNORMAL HIGH (ref 0–29)
Microalbumin, Urine: 95.6 ug/mL

## 2020-12-30 ENCOUNTER — Telehealth: Payer: Self-pay

## 2020-12-30 NOTE — Telephone Encounter (Signed)
-----   Message from [name], MD sent at 12/26/2020 12:51 PM EDT ----- Please inform him that his total cholesterol is normal but triglyceride which is a type of cholesterol is elevated.  Please advise him to work on a low-cholesterol diet and exercise and continue with his current cholesterol medication.

## 2020-12-30 NOTE — Telephone Encounter (Signed)
Patient name and DOB has been verified Patient was informed of lab results. Patient had no questions.  

## 2021-01-12 DIAGNOSIS — H31091 Other chorioretinal scars, right eye: Secondary | ICD-10-CM | POA: Diagnosis not present

## 2021-01-12 DIAGNOSIS — H35371 Puckering of macula, right eye: Secondary | ICD-10-CM | POA: Diagnosis not present

## 2021-01-12 DIAGNOSIS — H18231 Secondary corneal edema, right eye: Secondary | ICD-10-CM | POA: Diagnosis not present

## 2021-01-12 DIAGNOSIS — H50111 Monocular exotropia, right eye: Secondary | ICD-10-CM | POA: Diagnosis not present

## 2021-01-13 ENCOUNTER — Other Ambulatory Visit: Payer: Self-pay | Admitting: Family Medicine

## 2021-01-13 DIAGNOSIS — K219 Gastro-esophageal reflux disease without esophagitis: Secondary | ICD-10-CM

## 2021-01-13 NOTE — Telephone Encounter (Signed)
   Notes to clinic:  script requested is expired  Review for continued use and refill    Requested Prescriptions  Pending Prescriptions Disp Refills   omeprazole (PRILOSEC) 20 MG capsule [Pharmacy Med Name: OMEPRAZOLE 20MG  CAPSULES] 30 capsule     Sig: TAKE 1 CAPSULE(20 MG) BY MOUTH DAILY      Gastroenterology: Proton Pump Inhibitors Passed - 01/13/2021  1:38 PM      Passed - Valid encounter within last 12 months    Recent Outpatient Visits           7 months ago Type 2 diabetes mellitus with other specified complication, without long-term current use of insulin ([place])   [place], [place], MD   1 year ago Type 2 diabetes mellitus with other specified complication, without long-term current use of insulin ([place])   [place], [place], MD   1 year ago Type 2 diabetes mellitus with other specified complication, without long-term current use of insulin ([place])   [place], [place], MD   2 years ago Type 2 diabetes mellitus with other specified complication, without long-term current use of insulin ([place])   [place], [place], MD   2 years ago Chronic gout without tophus, unspecified cause, unspecified site   [place], [place]obong, MD       Future Appointments             In 1 week  [facility] [place]   In 5 months [name], MD [place]   In 5 months Daisy Blossom, Jarome [place], [place]

## 2021-01-15 ENCOUNTER — Other Ambulatory Visit: Payer: Self-pay

## 2021-01-15 NOTE — Patient Outreach (Signed)
[facility] ([facility]) Care Management  [facility] Care Manager  01/15/2021   [name] 12/09/1955 [id number]  Subjective: Telephone call to patient for disease management follow up. Patient doing good.  Received his blood pressure cuff and has been taking blood pressure and writing it down. He is not home to give readings.  Discussed hypertension management.    Objective:   Encounter Medications:  Outpatient Encounter Medications as of 01/15/2021  Medication Sig   acetaminophen (TYLENOL) 500 MG tablet Take 500 mg by mouth every 6 (six) hours as needed for moderate pain.   acetaZOLAMIDE (DIAMOX) 250 MG tablet Take 500 mg by mouth 2 (two) times daily.   allopurinol (ZYLOPRIM) 300 MG tablet Take 1 tablet (300 mg total) by mouth daily.   ALPHAGAN P 0.1 % SOLN Apply to eye.   amLODipine (NORVASC) 10 MG tablet 1 tablet   Apremilast 30 MG TABS Take 30 mg by mouth 2 (two) times daily.   atorvastatin (LIPITOR) 40 MG tablet Take 1 tablet (40 mg total) by mouth daily.   Blood Glucose Monitoring Suppl (BAYER CONTOUR NEXT MONITOR) w/Device KIT Use as directed   buPROPion (WELLBUTRIN XL) 150 MG 24 hr tablet TAKE 1 TABLET EVERY DAY   carvedilol (COREG) 3.125 MG tablet TAKE 1 TABLET TWICE DAILY WITH A MEAL   cetirizine (ZYRTEC) 10 MG tablet TAKE 1 TABLET(10 MG) BY MOUTH DAILY   clobetasol (OLUX) 0.05 % topical foam 1 application to affected area   coal tar (NEUTROGENA T-GEL) 0.5 % shampoo Apply 1 application topically daily.   dorzolamide-timolol (COSOPT) 22.3-6.8 MG/ML ophthalmic solution SMARTSIG:In Eye(s)   fluticasone (FLONASE) 50 MCG/ACT nasal spray SHAKE LIQUID AND USE 1 SPRAY IN EACH NOSTRIL DAILY (Patient taking differently: Place 1 spray into both nostrils daily.)   gabapentin (NEURONTIN) 300 MG capsule Take 2 capsules (600 mg total) by mouth at bedtime.   glucose blood (BAYER CONTOUR NEXT TEST) test strip Use as instructed   hydrocortisone (ANUSOL-HC) 25 MG suppository Place 1  suppository (25 mg total) rectally 2 (two) times daily.   hydroxypropyl methylcellulose / hypromellose (ISOPTO TEARS / GONIOVISC) 2.5 % ophthalmic solution Place 1 drop into both eyes 3 (three) times daily as needed for dry eyes.   Insulin Pen Needle (PEN NEEDLES) 31G X 6 MM MISC Use lantus every night at hours of sleep (Patient not taking: Reported on 12/10/2020)   lidocaine (XYLOCAINE) 2 % solution Use as directed 15 mLs in the mouth or throat as needed for mouth pain. Swish and spit 15mLs as needed for dental pain   MITIGARE 0.6 MG CAPS TAKE 2 CAPSULES BY MOUTH AT THE START OF GOUT ATTACK, MAY TAKE 1 CAPSULE AFTERWARDS IF SYMPTOMS PERSIST (Patient taking differently: Take 0.6 mg by mouth daily.)   naltrexone (DEPADE) 50 MG tablet Take 1 tablet (50 mg total) by mouth daily.   Omega-3 Fatty Acids (FISH OIL) 1200 MG CAPS Take 1,200 mg by mouth daily.    omeprazole (PRILOSEC) 20 MG capsule TAKE 1 CAPSULE(20 MG) BY MOUTH DAILY   OVER THE COUNTER MEDICATION Take 250 mg by mouth daily. Super Beta Prostate (Patient not taking: Reported on 12/10/2020)   potassium chloride SA (KLOR-CON M20) 20 MEQ tablet 1 tablet with food   sildenafil (VIAGRA) 50 MG tablet Take 1 tablet (50 mg total) by mouth daily as needed for erectile dysfunction. At least 24 hours between doses   thiamine (VITAMIN B-1) 100 MG tablet Take 100 mg by mouth daily.   triamcinolone cream (  [facility]) Care Management  [place]  01/15/2021   [name] 06-May-1956 [id number]  Subjective: Telephone call to patient for disease management follow up. Patient doing good.  Received his blood pressure cuff and has been taking blood pressure and writing it down. He is not home to give readings.  Discussed hypertension management.    Objective:   Encounter Medications:  Outpatient Encounter Medications as of 01/15/2021  Medication Sig   acetaminophen (TYLENOL) 500 MG tablet Take 500 mg by mouth every 6 (six) hours as needed for moderate pain.   acetaZOLAMIDE (DIAMOX) 250 MG tablet Take 500 mg by mouth 2 (two) times daily.   allopurinol (ZYLOPRIM) 300 MG tablet Take 1 tablet (300 mg total) by mouth daily.   ALPHAGAN P 0.1 % SOLN Apply to eye.   amLODipine (NORVASC) 10 MG tablet 1 tablet   Apremilast 30 MG TABS Take 30 mg by mouth 2 (two) times daily.   atorvastatin (LIPITOR) 40 MG tablet Take 1 tablet (40 mg total) by mouth daily.   Blood Glucose Monitoring Suppl (BAYER CONTOUR NEXT MONITOR) w/Device KIT Use as directed   buPROPion (WELLBUTRIN XL) 150 MG 24 hr tablet TAKE 1 TABLET EVERY DAY   carvedilol (COREG) 3.125 MG tablet TAKE 1 TABLET TWICE DAILY WITH A MEAL   cetirizine (ZYRTEC) 10 MG tablet TAKE 1 TABLET(10 MG) BY MOUTH DAILY   clobetasol (OLUX) 0.05 % topical foam 1 application to affected area   coal tar (NEUTROGENA T-GEL) 0.5 % shampoo Apply 1 application topically daily.   dorzolamide-timolol (COSOPT) 22.3-6.8 MG/ML ophthalmic solution SMARTSIG:In Eye(s)   fluticasone (FLONASE) 50 MCG/ACT nasal spray SHAKE LIQUID AND USE 1 SPRAY IN EACH NOSTRIL DAILY (Patient taking differently: Place 1 spray into both nostrils daily.)   gabapentin (NEURONTIN) 300 MG capsule Take 2 capsules (600 mg total) by mouth at bedtime.   glucose blood (BAYER CONTOUR NEXT TEST) test strip Use as instructed   hydrocortisone (ANUSOL-HC) 25 MG suppository Place 1  suppository (25 mg total) rectally 2 (two) times daily.   hydroxypropyl methylcellulose / hypromellose (ISOPTO TEARS / GONIOVISC) 2.5 % ophthalmic solution Place 1 drop into both eyes 3 (three) times daily as needed for dry eyes.   Insulin Pen Needle (PEN NEEDLES) 31G X 6 MM MISC Use lantus every night at hours of sleep (Patient not taking: Reported on 12/10/2020)   lidocaine (XYLOCAINE) 2 % solution Use as directed 15 mLs in the mouth or throat as needed for mouth pain. Swish and spit 50ms as needed for dental pain   MITIGARE 0.6 MG CAPS TAKE 2 CAPSULES BY MOUTH AT THE START OF GOUT ATTACK, MAY TAKE 1 CAPSULE AFTERWARDS IF SYMPTOMS PERSIST (Patient taking differently: Take 0.6 mg by mouth daily.)   naltrexone (DEPADE) 50 MG tablet Take 1 tablet (50 mg total) by mouth daily.   Omega-3 Fatty Acids (FISH OIL) 1200 MG CAPS Take 1,200 mg by mouth daily.    omeprazole (PRILOSEC) 20 MG capsule TAKE 1 CAPSULE(20 MG) BY MOUTH DAILY   OVER THE COUNTER MEDICATION Take 250 mg by mouth daily. Super Beta Prostate (Patient not taking: Reported on 12/10/2020)   potassium chloride SA (KLOR-CON M20) 20 MEQ tablet 1 tablet with food   sildenafil (VIAGRA) 50 MG tablet Take 1 tablet (50 mg total) by mouth daily as needed for erectile dysfunction. At least 24 hours between doses   thiamine (VITAMIN B-1) 100 MG tablet Take 100 mg by mouth daily.   triamcinolone cream (  [facility] ([facility] Care Management  [facility] Care Manager  01/15/2021   [name] 12/09/1955 [id number]  Subjective: Telephone call to patient for disease management follow up. Patient doing good.  Received his blood pressure cuff and has been taking blood pressure and writing it down. He is not home to give readings.  Discussed hypertension management.    Objective:   Encounter Medications:  Outpatient Encounter Medications as of 01/15/2021  Medication Sig   acetaminophen (TYLENOL) 500 MG tablet Take 500 mg by mouth every 6 (six) hours as needed for moderate pain.   acetaZOLAMIDE (DIAMOX) 250 MG tablet Take 500 mg by mouth 2 (two) times daily.   allopurinol (ZYLOPRIM) 300 MG tablet Take 1 tablet (300 mg total) by mouth daily.   ALPHAGAN P 0.1 % SOLN Apply to eye.   amLODipine (NORVASC) 10 MG tablet 1 tablet   Apremilast 30 MG TABS Take 30 mg by mouth 2 (two) times daily.   atorvastatin (LIPITOR) 40 MG tablet Take 1 tablet (40 mg total) by mouth daily.   Blood Glucose Monitoring Suppl (BAYER CONTOUR NEXT MONITOR) w/Device KIT Use as directed   buPROPion (WELLBUTRIN XL) 150 MG 24 hr tablet TAKE 1 TABLET EVERY DAY   carvedilol (COREG) 3.125 MG tablet TAKE 1 TABLET TWICE DAILY WITH A MEAL   cetirizine (ZYRTEC) 10 MG tablet TAKE 1 TABLET(10 MG) BY MOUTH DAILY   clobetasol (OLUX) 0.05 % topical foam 1 application to affected area   coal tar (NEUTROGENA T-GEL) 0.5 % shampoo Apply 1 application topically daily.   dorzolamide-timolol (COSOPT) 22.3-6.8 MG/ML ophthalmic solution SMARTSIG:In Eye(s)   fluticasone (FLONASE) 50 MCG/ACT nasal spray SHAKE LIQUID AND USE 1 SPRAY IN EACH NOSTRIL DAILY (Patient taking differently: Place 1 spray into both nostrils daily.)   gabapentin (NEURONTIN) 300 MG capsule Take 2 capsules (600 mg total) by mouth at bedtime.   glucose blood (BAYER CONTOUR NEXT TEST) test strip Use as instructed   hydrocortisone (ANUSOL-HC) 25 MG suppository Place 1  suppository (25 mg total) rectally 2 (two) times daily.   hydroxypropyl methylcellulose / hypromellose (ISOPTO TEARS / GONIOVISC) 2.5 % ophthalmic solution Place 1 drop into both eyes 3 (three) times daily as needed for dry eyes.   Insulin Pen Needle (PEN NEEDLES) 31G X 6 MM MISC Use lantus every night at hours of sleep (Patient not taking: Reported on 12/10/2020)   lidocaine (XYLOCAINE) 2 % solution Use as directed 15 mLs in the mouth or throat as needed for mouth pain. Swish and spit 15mLs as needed for dental pain   MITIGARE 0.6 MG CAPS TAKE 2 CAPSULES BY MOUTH AT THE START OF GOUT ATTACK, MAY TAKE 1 CAPSULE AFTERWARDS IF SYMPTOMS PERSIST (Patient taking differently: Take 0.6 mg by mouth daily.)   naltrexone (DEPADE) 50 MG tablet Take 1 tablet (50 mg total) by mouth daily.   Omega-3 Fatty Acids (FISH OIL) 1200 MG CAPS Take 1,200 mg by mouth daily.    omeprazole (PRILOSEC) 20 MG capsule TAKE 1 CAPSULE(20 MG) BY MOUTH DAILY   OVER THE COUNTER MEDICATION Take 250 mg by mouth daily. Super Beta Prostate (Patient not taking: Reported on 12/10/2020)   potassium chloride SA (KLOR-CON M20) 20 MEQ tablet 1 tablet with food   sildenafil (VIAGRA) 50 MG tablet Take 1 tablet (50 mg total) by mouth daily as needed for erectile dysfunction. At least 24 hours between doses   thiamine (VITAMIN B-1) 100 MG tablet Take 100 mg by mouth daily.   triamcinolone cream (

## 2021-01-15 NOTE — Patient Instructions (Signed)
Goals Addressed             This Visit's Progress    Track and Manage My Blood Pressure-Hypertension   On track    Barriers: Health Behaviors Knowledge Timeframe:  Long-Range Goal Priority:  High Start Date: 12/10/20                             Expected End Date:    07/04/21                   Follow Up Date 03/04/21   - check blood pressure weekly - write blood pressure results in a log or diary    Why is this important?   You won't feel high blood pressure, but it can still hurt your blood vessels.  High blood pressure can cause heart or kidney problems. It can also cause a stroke.  Making lifestyle changes like losing a little weight or eating less salt will help.  Checking your blood pressure at home and at different times of the day can help to control blood pressure.  If the doctor prescribes medicine remember to take it the way the doctor ordered.  Call the office if you cannot afford the medicine or if there are questions about it.     Notes: 12/10/20 Patient does not have blood pressure cuff to monitor pressure.  Ordered blood pressure cuff.  Discussed hypertension management: Take medications as ordered Limit salt intake. Eat plenty of fruits and vegetables Remain as active as possible Take blood pressure at least weekly at home and record in a notebook to take to doctors visits  01/15/21 Patient reports he has his blood pressure cuff and has been checking his blood pressure.  He does not have his readings with but states it is less than 140/80.  Patient reports he is eating a low salt diet.  He has all his medications.   Hypertension Management Reiterated Take medications as ordered Limit salt intake. Eat plenty of fruits and vegetables Remain as active as possible Take blood pressure at least weekly at home and record in a notebook to take to doctor's visits.

## 2021-01-22 ENCOUNTER — Ambulatory Visit: Payer: Medicare HMO

## 2021-01-23 ENCOUNTER — Telehealth: Payer: Self-pay | Admitting: Nurse Practitioner

## 2021-01-23 ENCOUNTER — Telehealth: Payer: Self-pay | Admitting: Family Medicine

## 2021-01-23 NOTE — Telephone Encounter (Signed)
Called to confirm appt with provider on Monday no answer message was left

## 2021-01-26 ENCOUNTER — Ambulatory Visit (INDEPENDENT_AMBULATORY_CARE_PROVIDER_SITE_OTHER): Payer: Medicare HMO | Admitting: Nurse Practitioner

## 2021-01-26 VITALS — BP 145/104 | HR 87 | Temp 97.9°F | Resp 18 | Ht 74.0 in | Wt 182.0 lb

## 2021-01-26 DIAGNOSIS — Z0000 Encounter for general adult medical examination without abnormal findings: Secondary | ICD-10-CM | POA: Diagnosis not present
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
## 2021-01-26 NOTE — Patient Instructions (Addendum)
[name] , Thank you for taking time to come for your Medicare Wellness Visit. I appreciate your ongoing commitment to your health goals. Please review the following plan we discussed and let me know if I can assist you in the future.   These are the goals we discussed:  Goals     . Have 3 meals a day    . Track and Manage My Blood Pressure-Hypertension     Barriers: Health Behaviors Knowledge Timeframe:  Long-Range Goal Priority:  High Start Date: 12/10/20                             Expected End Date:    07/04/21                   Follow Up Date 03/04/21   - check blood pressure weekly - write blood pressure results in a log or diary    Why is this important?   You won't feel high blood pressure, but it can still hurt your blood vessels.  High blood pressure can cause heart or kidney problems. It can also cause a stroke.  Making lifestyle changes like losing a little weight or eating less salt will help.  Checking your blood pressure at home and at different times of the day can help to control blood pressure.  If the doctor prescribes medicine remember to take it the way the doctor ordered.  Call the office if you cannot afford the medicine or if there are questions about it.     Notes: 12/10/20 Patient does not have blood pressure cuff to monitor pressure.  Ordered blood pressure cuff.  Discussed hypertension management: Take medications as ordered Limit salt intake. Eat plenty of fruits and vegetables Remain as active as possible Take blood pressure at least weekly at home and record in a notebook to take to doctors visits  01/15/21 Patient reports he has his blood pressure cuff and has been checking his blood pressure.  He does not have his readings with but states it is less than 140/80.  Patient reports he is eating a low salt diet.  He has all his medications.   Hypertension Management Reiterated Take medications as ordered Limit salt intake. Eat plenty of fruits and  vegetables Remain as active as possible Take blood pressure at least weekly at home and record in a notebook to take to doctor's visits.         This is a list of the screening recommended for you and due dates:  Health Maintenance  Topic Date Due  . COVID-19 Vaccine (1) Never done  . Zoster (Shingles) Vaccine (1 of 2) 03/27/2021*  . Tetanus Vaccine  12/25/2021*  . Pneumonia vaccines (1 of 2 - PCV13) 12/25/2021*  . Flu Shot  02/02/2021  . Hemoglobin A1C  06/26/2021  . Eye exam for diabetics  12/05/2021  . Complete foot exam   12/25/2021  . Urine Protein Check  12/25/2021  . Colon Cancer Screening  04/22/2029  . Hepatitis C Screening: USPSTF Recommendation to screen - Ages 20-79 yo.  Completed  . HPV Vaccine  Aged Out  *Topic was postponed. The date shown is not the original due date.    Fall Prevention in the Home, Adult Falls can cause injuries and can happen to people of all ages. There are many things you can do to make your home safe and to help prevent falls. Ask forhelp when  making these changes. What actions can I take to prevent falls? General Instructions Use good lighting in all rooms. Replace any light bulbs that burn out. Turn on the lights in dark areas. Use night-lights. Keep items that you use often in easy-to-reach places. Lower the shelves around your home if needed. Set up your furniture so you have a clear path. Avoid moving your furniture around. Do not have throw rugs or other things on the floor that can make you trip. Avoid walking on wet floors. If any of your floors are uneven, fix them. Add color or contrast paint or tape to clearly mark and help you see: Grab bars or handrails. First and last steps of staircases. Where the edge of each step is. If you use a stepladder: Make sure that it is fully opened. Do not climb a closed stepladder. Make sure the sides of the stepladder are locked in place. Ask someone to hold the stepladder while you use  it. Know where your pets are when moving through your home. What can I do in the bathroom?     Keep the floor dry. Clean up any water on the floor right away. Remove soap buildup in the tub or shower. Use nonskid mats or decals on the floor of the tub or shower. Attach bath mats securely with double-sided, nonslip rug tape. If you need to sit down in the shower, use a plastic, nonslip stool. Install grab bars by the toilet and in the tub and shower. Do not use towel bars as grab bars. What can I do in the bedroom? Make sure that you have a light by your bed that is easy to reach. Do not use any sheets or blankets for your bed that hang to the floor. Have a firm chair with side arms that you can use for support when you get dressed. What can I do in the kitchen? Clean up any spills right away. If you need to reach something above you, use a step stool with a grab bar. Keep electrical cords out of the way. Do not use floor polish or wax that makes floors slippery. What can I do with my stairs? Do not leave any items on the stairs. Make sure that you have a light switch at the top and the bottom of the stairs. Make sure that there are handrails on both sides of the stairs. Fix handrails that are broken or loose. Install nonslip stair treads on all your stairs. Avoid having throw rugs at the top or bottom of the stairs. Choose a carpet that does not hide the edge of the steps on the stairs. Check carpeting to make sure that it is firmly attached to the stairs. Fix carpet that is loose or worn. What can I do on the outside of my home? Use bright outdoor lighting. Fix the edges of walkways and driveways and fix any cracks. Remove anything that might make you trip as you walk through a door, such as a raised step or threshold. Trim any bushes or trees on paths to your home. Check to see if handrails are loose or broken and that both sides of all steps have handrails. Install guardrails  along the edges of any raised decks and porches. Clear paths of anything that can make you trip, such as tools or rocks. Have leaves, snow, or ice cleared regularly. Use sand or salt on paths during winter. Clean up any spills in your garage right away. This includes grease or  oil spills. What other actions can I take? Wear shoes that: Have a low heel. Do not wear high heels. Have rubber bottoms. Feel good on your feet and fit well. Are closed at the toe. Do not wear open-toe sandals. Use tools that help you move around if needed. These include: Canes. Walkers. Scooters. Crutches. Review your medicines with your doctor. Some medicines can make you feel dizzy. This can increase your chance of falling. Ask your doctor what else you can do to help prevent falls. Where to find more information Centers for Disease Control and Prevention, [employer]: http://www.wolf.info/ National Institute on Aging: http://kim-miller.com/ Contact a doctor if: You are afraid of falling at home. You feel weak, drowsy, or dizzy at home. You fall at home. Summary There are many simple things that you can do to make your home safe and to help prevent falls. Ways to make your home safe include removing things that can make you trip and installing grab bars in the bathroom. Ask for help when making these changes in your home. This information is not intended to replace advice given to you by your health care provider. Make sure you discuss any questions you have with your healthcare provider. Document Revised: 01/23/2020 Document Reviewed: 01/23/2020 Elsevier Patient Education  [place] Maintenance, Male Adopting a healthy lifestyle and getting preventive care are important in promoting health and wellness. Ask your health care provider about: The right schedule for you to have regular tests and exams. Things you can do on your own to prevent diseases and keep yourself healthy. What should I know about diet,  weight, and exercise? Eat a healthy diet  Eat a diet that includes plenty of vegetables, fruits, low-fat dairy products, and lean protein. Do not eat a lot of foods that are high in solid fats, added sugars, or sodium.  Maintain a healthy weight Body mass index (BMI) is a measurement that can be used to identify possible weight problems. It estimates body fat based on height and weight. Your health care provider can help determine your BMI and help you achieve or maintain ahealthy weight. Get regular exercise Get regular exercise. This is one of the most important things you can do for your health. Most adults should: Exercise for at least 150 minutes each week. The exercise should increase your heart rate and make you sweat (moderate-intensity exercise). Do strengthening exercises at least twice a week. This is in addition to the moderate-intensity exercise. Spend less time sitting. Even light physical activity can be beneficial. Watch cholesterol and blood lipids Have your blood tested for lipids and cholesterol at 66 years of age, then havethis test every 5 years. You may need to have your cholesterol levels checked more often if: Your lipid or cholesterol levels are high. You are older than 66 years of age. You are at high risk for heart disease. What should I know about cancer screening? Many types of cancers can be detected early and may often be prevented. Depending on your health history and family history, you may need to have cancer screening at various ages. This may include screening for: Colorectal cancer. Prostate cancer. Skin cancer. Lung cancer. What should I know about heart disease, diabetes, and high blood pressure? Blood pressure and heart disease High blood pressure causes heart disease and increases the risk of stroke. This is more likely to develop in people who have high blood pressure readings, are of African descent, or are overweight. Talk with your health  care  provider about your target blood pressure readings. Have your blood pressure checked: Every 3-5 years if you are 85-52 years of age. Every year if you are 8 years old or older. If you are between the ages of 88 and 7 and are a current or former smoker, ask your health care provider if you should have a one-time screening for abdominal aortic aneurysm (AAA). Diabetes Have regular diabetes screenings. This checks your fasting blood sugar level. Have the screening done: Once every three years after age 59 if you are at a normal weight and have a low risk for diabetes. More often and at a younger age if you are overweight or have a high risk for diabetes. What should I know about preventing infection? Hepatitis B If you have a higher risk for hepatitis B, you should be screened for this virus. Talk with your health care provider to find out if you are at risk forhepatitis B infection. Hepatitis C Blood testing is recommended for: Everyone born from [birth year] through 1966. Anyone with known risk factors for hepatitis C. Sexually transmitted infections (STIs) You should be screened each year for STIs, including gonorrhea and chlamydia, if: You are sexually active and are younger than 66 years of age. You are older than 66 years of age and your health care provider tells you that you are at risk for this type of infection. Your sexual activity has changed since you were last screened, and you are at increased risk for chlamydia or gonorrhea. Ask your health care provider if you are at risk. Ask your health care provider about whether you are at high risk for HIV. Your health care provider may recommend a prescription medicine to help prevent HIV infection. If you choose to take medicine to prevent HIV, you should first get tested for HIV. You should then be tested every 3 months for as long as you are taking the medicine. Follow these instructions at home: Lifestyle Do not use any products that contain  nicotine or tobacco, such as cigarettes, e-cigarettes, and chewing tobacco. If you need help quitting, ask your health care provider. Do not use street drugs. Do not share needles. Ask your health care provider for help if you need support or information about quitting drugs. Alcohol use Do not drink alcohol if your health care provider tells you not to drink. If you drink alcohol: Limit how much you have to 0-2 drinks a day. Be aware of how much alcohol is in your drink. In the [place] one drink equals one 12 oz bottle of beer (355 mL), one 5 oz glass of wine (148 mL), or one 1 oz glass of hard liquor (44 mL). General instructions Schedule regular health, dental, and eye exams. Stay current with your vaccines. Tell your health care provider if: You often feel depressed. You have ever been abused or do not feel safe at home. Summary Adopting a healthy lifestyle and getting preventive care are important in promoting health and wellness. Follow your health care provider's instructions about healthy diet, exercising, and getting tested or screened for diseases. Follow your health care provider's instructions on monitoring your cholesterol and blood pressure. This information is not intended to replace advice given to you by your health care provider. Make sure you discuss any questions you have with your healthcare provider. Document Revised: 06/14/2018 Document Reviewed: 06/14/2018 Elsevier Patient Education  2022 [place].  Steps to Quit Smoking Smoking tobacco is the leading cause of preventable death. It  can affect almost every organ in the body. Smoking puts you and people around you at risk for many serious, long-lasting (chronic) diseases. Quitting smoking can be hard, but it is one of the best things thatyou can do for your health. It is never too late to quit. How do I get ready to quit? When you decide to quit smoking, make a plan to help you succeed. Before you quit: Pick a  date to quit. Set a date within the next 2 weeks to give you time to prepare. Write down the reasons why you are quitting. Keep this list in places where you will see it often. Tell your family, friends, and co-workers that you are quitting. Their support is important. Talk with your doctor about the choices that may help you quit. Find out if your health insurance will pay for these treatments. Know the people, places, things, and activities that make you want to smoke (triggers). Avoid them. What first steps can I take to quit smoking? Throw away all cigarettes at home, at work, and in your car. Throw away the things that you use when you smoke, such as ashtrays and lighters. Clean your car. Make sure to empty the ashtray. Clean your home, including curtains and carpets. What can I do to help me quit smoking? Talk with your doctor about taking medicines and seeing a counselor at the same time. You are more likely to succeed when you do both. If you are pregnant or breastfeeding, talk with your doctor about counseling or other ways to quit smoking. Do not take medicine to help you quit smoking unless your doctor tells you to do so. To quit smoking: Quit right away Quit smoking totally, instead of slowly cutting back on how much you smoke over a period of time. Go to counseling. You are more likely to quit if you go to counseling sessions regularly. Take medicine You may take medicines to help you quit. Some medicines need a prescription, and some you can buy over-the-counter. Some medicines may contain a drug called nicotine to replace the nicotine in cigarettes. Medicines may: Help you to stop having the desire to smoke (cravings). Help to stop the problems that come when you stop smoking (withdrawal symptoms). Your doctor may ask you to use: Nicotine patches, gum, or lozenges. Nicotine inhalers or sprays. Non-nicotine medicine that is taken by mouth. Find resources Find resources and  other ways to help you quit smoking and remain smoke-free after you quit. These resources are most helpful when you use them often. They include: Online chats with a Social worker. Phone quitlines. Printed Furniture conservator/restorer. Support groups or group counseling. Text messaging programs. Mobile phone apps. Use apps on your mobile phone or tablet that can help you stick to your quit plan. There are many free apps for mobile phones and tablets as well as websites. Examples include Quit Guide from the [employer] and smokefree.gov  What things can I do to make it easier to quit?  Talk to your family and friends. Ask them to support and encourage you. Call a phone quitline (1-800-QUIT-NOW), reach out to support groups, or work with a Social worker. Ask people who smoke to not smoke around you. Avoid places that make you want to smoke, such as: Bars. Parties. Smoke-break areas at work. Spend time with people who do not smoke. Lower the stress in your life. Stress can make you want to smoke. Try these things to help your stress: Getting regular exercise. Doing deep-breathing exercises.  Doing yoga. Meditating. Doing a body scan. To do this, close your eyes, focus on one area of your body at a time from head to toe. Notice which parts of your body are tense. Try to relax the muscles in those areas. How will I feel when I quit smoking? Day 1 to 3 weeks Within the first 24 hours, you may start to have some problems that come from quitting tobacco. These problems are very bad 2-3 days after you quit, but they do not often last for more than 2-3 weeks. You may get these symptoms: Mood swings. Feeling restless, nervous, angry, or annoyed. Trouble concentrating. Dizziness. Strong desire for high-sugar foods and nicotine. Weight gain. Trouble pooping (constipation). Feeling like you may vomit (nausea). Coughing or a sore throat. Changes in how the medicines that you take for other issues work in your  body. Depression. Trouble sleeping (insomnia). Week 3 and afterward After the first 2-3 weeks of quitting, you may start to notice more positive results, such as: Better sense of smell and taste. Less coughing and sore throat. Slower heart rate. Lower blood pressure. Clearer skin. Better breathing. Fewer sick days. Quitting smoking can be hard. Do not give up if you fail the first time. Some people need to try a few times before they succeed. Do your best to stick to your quit plan, and talk with yourdoctor if you have any questions or concerns. Summary Smoking tobacco is the leading cause of preventable death. Quitting smoking can be hard, but it is one of the best things that you can do for your health. When you decide to quit smoking, make a plan to help you succeed. Quit smoking right away, not slowly over a period of time. When you start quitting, seek help from your doctor, family, or friends. This information is not intended to replace advice given to you by your health care provider. Make sure you discuss any questions you have with your healthcare provider. Document Revised: 03/16/2019 Document Reviewed: 09/09/2018 Elsevier Patient Education  [place].

## 2021-02-18 ENCOUNTER — Other Ambulatory Visit: Payer: Self-pay

## 2021-02-18 NOTE — Patient Outreach (Signed)
[facility]) Care Management  [place]  02/18/2021   [name] February 17, 1956 [id number]  Subjective: Telephone call to patient. He reports he is doing good. He has returned back to work to give him something to do.  He reports work non-strenuous.  Hypertension management continues.    Objective:   Encounter Medications:  Outpatient Encounter Medications as of 02/18/2021  Medication Sig   acetaminophen (TYLENOL) 500 MG tablet Take 500 mg by mouth every 6 (six) hours as needed for moderate pain.   acetaZOLAMIDE (DIAMOX) 250 MG tablet Take 500 mg by mouth 2 (two) times daily.   allopurinol (ZYLOPRIM) 300 MG tablet Take 1 tablet (300 mg total) by mouth daily.   ALPHAGAN P 0.1 % SOLN Apply to eye.   amLODipine (NORVASC) 10 MG tablet 1 tablet   Apremilast 30 MG TABS Take 30 mg by mouth 2 (two) times daily.   atorvastatin (LIPITOR) 40 MG tablet Take 1 tablet (40 mg total) by mouth daily.   Blood Glucose Monitoring Suppl (BAYER CONTOUR NEXT MONITOR) w/Device KIT Use as directed   buPROPion (WELLBUTRIN XL) 150 MG 24 hr tablet TAKE 1 TABLET EVERY DAY   carvedilol (COREG) 3.125 MG tablet TAKE 1 TABLET TWICE DAILY WITH A MEAL   cetirizine (ZYRTEC) 10 MG tablet TAKE 1 TABLET(10 MG) BY MOUTH DAILY   clobetasol (OLUX) 0.05 % topical foam 1 application to affected area   coal tar (NEUTROGENA T-GEL) 0.5 % shampoo Apply 1 application topically daily.   dorzolamide-timolol (COSOPT) 22.3-6.8 MG/ML ophthalmic solution SMARTSIG:In Eye(s)   fluticasone (FLONASE) 50 MCG/ACT nasal spray SHAKE LIQUID AND USE 1 SPRAY IN EACH NOSTRIL DAILY (Patient taking differently: Place 1 spray into both nostrils daily.)   gabapentin (NEURONTIN) 300 MG capsule Take 2 capsules (600 mg total) by mouth at bedtime.   glucose blood (BAYER CONTOUR NEXT TEST) test strip Use as instructed   hydrocortisone (ANUSOL-HC) 25 MG suppository Place 1 suppository (25 mg total) rectally 2 (two) times daily.    hydroxypropyl methylcellulose / hypromellose (ISOPTO TEARS / GONIOVISC) 2.5 % ophthalmic solution Place 1 drop into both eyes 3 (three) times daily as needed for dry eyes.   Insulin Pen Needle (PEN NEEDLES) 31G X 6 MM MISC Use lantus every night at hours of sleep (Patient not taking: Reported on 12/10/2020)   lidocaine (XYLOCAINE) 2 % solution Use as directed 15 mLs in the mouth or throat as needed for mouth pain. Swish and spit 59ms as needed for dental pain   MITIGARE 0.6 MG CAPS TAKE 2 CAPSULES BY MOUTH AT THE START OF GOUT ATTACK, MAY TAKE 1 CAPSULE AFTERWARDS IF SYMPTOMS PERSIST (Patient taking differently: Take 0.6 mg by mouth daily.)   naltrexone (DEPADE) 50 MG tablet Take 1 tablet (50 mg total) by mouth daily.   Omega-3 Fatty Acids (FISH OIL) 1200 MG CAPS Take 1,200 mg by mouth daily.    omeprazole (PRILOSEC) 20 MG capsule TAKE 1 CAPSULE(20 MG) BY MOUTH DAILY   OVER THE COUNTER MEDICATION Take 250 mg by mouth daily. Super Beta Prostate (Patient not taking: Reported on 12/10/2020)   potassium chloride SA (KLOR-CON M20) 20 MEQ tablet 1 tablet with food   sildenafil (VIAGRA) 50 MG tablet Take 1 tablet (50 mg total) by mouth daily as needed for erectile dysfunction. At least 24 hours between doses   thiamine (VITAMIN B-1) 100 MG tablet Take 100 mg by mouth daily.   triamcinolone cream (KENALOG) 0.1 % Apply 1 application topically 2 (  two) times daily.   varenicline (CHANTIX PAK) 0.5 MG X 11 & 1 MG X 42 tablet Take one 0.5 mg tablet by mouth once daily for 3 days, then increase to one 0.5 mg tablet twice daily for 4 days, then increase to one 1 mg tablet twice daily.   No facility-administered encounter medications on file as of 02/18/2021.    Functional Status:  In your present state of health, do you have any difficulty performing the following activities: 01/26/2021 12/10/2020  Hearing? Y Y  Comment - some HOH  Vision? Y Y  Comment - recent cataract surgery  Difficulty concentrating or making  decisions? N N  Walking or climbing stairs? N N  Dressing or bathing? N N  Doing errands, shopping? N N  Preparing Food and eating ? N N  Using the Toilet? N N  In the past six months, have you accidently leaked urine? N N  Do you have problems with loss of bowel control? N N  Managing your Medications? N N  Managing your Finances? N N  Housekeeping or managing your Housekeeping? N N  Some recent data might be hidden    Fall/Depression Screening: Fall Risk  01/26/2021 05/21/2020 01/15/2019  Falls in the past year? 1 0 0  Number falls in past yr: 1 0 -  Injury with Fall? 0 - -  Risk for fall due to : Impaired balance/gait - -  Follow up Education provided - -   PHQ 2/9 Scores 01/26/2021 12/25/2020 12/10/2020 05/21/2020 07/16/2019 01/15/2019 08/09/2018  PHQ - 2 Score 0 0 0 0 0 0 0  PHQ- 9 Score - 0 - 0 0 0 -    Assessment:   Care Plan Care Plan : Hypertension (Adult)  Updates made by Jon Billings, RN since 02/18/2021 12:00 AM     Problem: Hypertension (Hypertension)      Long-Range Goal: Hypertension Monitored as evidenced by blood pressure less than 140/80.   Start Date: 12/10/2020  Expected End Date: 07/04/2021  This Visit's Progress: On track  Recent Progress: On track  Priority: High  Note:   Evidence-based guidance:  Promote initial use of ambulatory blood pressure measurements (for 3 days) to rule out "white-coat" effect; identify masked hypertension and presence or absence of nocturnal "dipping" of blood pressure.   Encourage continued use of home blood pressure monitoring and recording in blood pressure log; include symptoms of hypotension or potential medication side effects in log.  Notes:     Task: Identify and Monitor Blood Pressure Elevation   Due Date: 07/04/2021  Priority: Routine  Responsible User: Jon Billings, RN  Note:   Care Management Activities:    - home or ambulatory blood pressure monitoring encouraged    Notes: 12/10/20 Blood pressure cuff  ordered 01/15/21 Patient reports he has his blood pressure cuff and has been checking his blood pressure.  He does not have his readings with but states it is less than 140/80.  Patient reports he is eating a low salt diet.  He has all his medications.   Hypertension Management Discussed Take medications as ordered Limit salt intake. Eat plenty of fruits and vegetables Remain as active as possible Take blood pressure at least weekly at home and record in a notebook to take to doctor's visits.  02/18/21 Patient reports doing good.  Does not have any blood pressure readings he can share but agrees that his blood pressure is less than 140/80.  Discussed hypertension management. No concerns.  Goals Addressed             This Visit's Progress    Track and Manage My Blood Pressure-Hypertension   On track    Barriers: Health Behaviors Knowledge Timeframe:  Long-Range Goal Priority:  High Start Date: 12/10/20                             Expected End Date:    07/04/21                   Follow Up Date 05/04/21  - check blood pressure weekly - write blood pressure results in a log or diary    Why is this important?   You won't feel high blood pressure, but it can still hurt your blood vessels.  High blood pressure can cause heart or kidney problems. It can also cause a stroke.  Making lifestyle changes like losing a little weight or eating less salt will help.  Checking your blood pressure at home and at different times of the day can help to control blood pressure.  If the doctor prescribes medicine remember to take it the way the doctor ordered.  Call the office if you cannot afford the medicine or if there are questions about it.     Notes: 12/10/20 Patient does not have blood pressure cuff to monitor pressure.  Ordered blood pressure cuff.  Discussed hypertension management: Take medications as ordered Limit salt intake. Eat plenty of fruits and vegetables Remain as active as  possible Take blood pressure at least weekly at home and record in a notebook to take to doctors visits  01/15/21 Patient reports he has his blood pressure cuff and has been checking his blood pressure.  He does not have his readings with but states it is less than 140/80.  Patient reports he is eating a low salt diet.  He has all his medications.   Hypertension Management Reiterated Take medications as ordered Limit salt intake. Eat plenty of fruits and vegetables Remain as active as possible Take blood pressure at least weekly at home and record in a notebook to take to doctor's visits.  02/18/21 Patient reports doing good.  Does not have any blood pressure readings he can share but agrees that his blood pressure is less than 140/80.  Discussed hypertension management. No concerns.          Plan:  Follow-up: Patient agrees to Care Plan and Follow-up. Follow-up in 4-6 week(s)  [name], RN, MSN [place] [facility] Care Management Coordinator Direct Line [phone] Cell [phone] Toll Free: [phone]  Fax: [phone]

## 2021-02-18 NOTE — Patient Instructions (Signed)
Goals Addressed             This Visit's Progress    Track and Manage My Blood Pressure-Hypertension   On track    Barriers: Health Behaviors Knowledge Timeframe:  Long-Range Goal Priority:  High Start Date: 12/10/20                             Expected End Date:    07/04/21                   Follow Up Date 05/04/21  - check blood pressure weekly - write blood pressure results in a log or diary    Why is this important?   You won't feel high blood pressure, but it can still hurt your blood vessels.  High blood pressure can cause heart or kidney problems. It can also cause a stroke.  Making lifestyle changes like losing a little weight or eating less salt will help.  Checking your blood pressure at home and at different times of the day can help to control blood pressure.  If the doctor prescribes medicine remember to take it the way the doctor ordered.  Call the office if you cannot afford the medicine or if there are questions about it.     Notes: 12/10/20 Patient does not have blood pressure cuff to monitor pressure.  Ordered blood pressure cuff.  Discussed hypertension management: Take medications as ordered Limit salt intake. Eat plenty of fruits and vegetables Remain as active as possible Take blood pressure at least weekly at home and record in a notebook to take to doctors visits  01/15/21 Patient reports he has his blood pressure cuff and has been checking his blood pressure.  He does not have his readings with but states it is less than 140/80.  Patient reports he is eating a low salt diet.  He has all his medications.   Hypertension Management Reiterated Take medications as ordered Limit salt intake. Eat plenty of fruits and vegetables Remain as active as possible Take blood pressure at least weekly at home and record in a notebook to take to doctor's visits.  02/18/21 Patient reports doing good.  Does not have any blood pressure readings he can share but agrees that  his blood pressure is less than 140/80.  Discussed hypertension management. No concerns.

## 2021-02-20 ENCOUNTER — Ambulatory Visit: Payer: Self-pay

## 2021-03-23 DIAGNOSIS — H338 Other retinal detachments: Secondary | ICD-10-CM | POA: Diagnosis not present

## 2021-03-23 DIAGNOSIS — H35371 Puckering of macula, right eye: Secondary | ICD-10-CM | POA: Diagnosis not present

## 2021-03-23 DIAGNOSIS — H43391 Other vitreous opacities, right eye: Secondary | ICD-10-CM | POA: Diagnosis not present

## 2021-03-23 DIAGNOSIS — H18231 Secondary corneal edema, right eye: Secondary | ICD-10-CM | POA: Diagnosis not present

## 2021-03-23 DIAGNOSIS — H59811 Chorioretinal scars after surgery for detachment, right eye: Secondary | ICD-10-CM | POA: Diagnosis not present

## 2021-03-25 ENCOUNTER — Other Ambulatory Visit: Payer: Self-pay

## 2021-03-25 NOTE — Patient Instructions (Signed)
Goals      Have 3 meals a day     Track and Manage My Blood Pressure-Hypertension     Barriers: Health Behaviors Knowledge Timeframe:  Long-Range Goal Priority:  High Start Date: 12/10/20                             Expected End Date:    01/01/22               Follow Up Date 05/04/21  - check blood pressure weekly - write blood pressure results in a log or diary    Why is this important?   You won't feel high blood pressure, but it can still hurt your blood vessels.  High blood pressure can cause heart or kidney problems. It can also cause a stroke.  Making lifestyle changes like losing a little weight or eating less salt will help.  Checking your blood pressure at home and at different times of the day can help to control blood pressure.  If the doctor prescribes medicine remember to take it the way the doctor ordered.  Call the office if you cannot afford the medicine or if there are questions about it.     Notes: 12/10/20 Patient does not have blood pressure cuff to monitor pressure.  Ordered blood pressure cuff.  Discussed hypertension management: Take medications as ordered Limit salt intake. Eat plenty of fruits and vegetables Remain as active as possible Take blood pressure at least weekly at home and record in a notebook to take to doctors visits  01/15/21 Patient reports he has his blood pressure cuff and has been checking his blood pressure.  He does not have his readings with but states it is less than 140/80.  Patient reports he is eating a low salt diet.  He has all his medications.   Hypertension Management Reiterated Take medications as ordered Limit salt intake. Eat plenty of fruits and vegetables Remain as active as possible Take blood pressure at least weekly at home and record in a notebook to take to doctor's visits.  02/18/21 Patient reports doing good.  Does not have any blood pressure readings he can share but agrees that his blood pressure is less than 140/80.   Discussed hypertension management. No concerns.   03/25/21 Patient doing good.  Working again.  He reports his last blood pressure reading was good. He does not remember the numbers. Encouraged to check blood pressure at least weekly.  He is agreeable.  Reiterated hypertension management and low salt diet.

## 2021-03-25 NOTE — Patient Outreach (Signed)
[name]) Care Management  [name]  03/25/2021   [name] [birth year] [id number]  Subjective: Telephone call to patient for follow up. Hypertension management continues. No concerns.    Objective:   Encounter Medications:  Outpatient Encounter Medications as of 03/25/2021  Medication Sig   acetaminophen (TYLENOL) 500 MG tablet Take 500 mg by mouth every 6 (six) hours as needed for moderate pain.   acetaZOLAMIDE (DIAMOX) 250 MG tablet Take 500 mg by mouth 2 (two) times daily.   allopurinol (ZYLOPRIM) 300 MG tablet Take 1 tablet (300 mg total) by mouth daily.   ALPHAGAN P 0.1 % SOLN Apply to eye.   amLODipine (NORVASC) 10 MG tablet 1 tablet   Apremilast 30 MG TABS Take 30 mg by mouth 2 (two) times daily.   atorvastatin (LIPITOR) 40 MG tablet Take 1 tablet (40 mg total) by mouth daily.   Blood Glucose Monitoring Suppl (BAYER CONTOUR NEXT MONITOR) w/Device KIT Use as directed   buPROPion (WELLBUTRIN XL) 150 MG 24 hr tablet TAKE 1 TABLET EVERY DAY   carvedilol (COREG) 3.125 MG tablet TAKE 1 TABLET TWICE DAILY WITH A MEAL   cetirizine (ZYRTEC) 10 MG tablet TAKE 1 TABLET(10 MG) BY MOUTH DAILY   clobetasol (OLUX) 0.05 % topical foam 1 application to affected area   coal tar (NEUTROGENA T-GEL) 0.5 % shampoo Apply 1 application topically daily.   dorzolamide-timolol (COSOPT) 22.3-6.8 MG/ML ophthalmic solution SMARTSIG:In Eye(s)   fluticasone (FLONASE) 50 MCG/ACT nasal spray SHAKE LIQUID AND USE 1 SPRAY IN EACH NOSTRIL DAILY (Patient taking differently: Place 1 spray into both nostrils daily.)   gabapentin (NEURONTIN) 300 MG capsule Take 2 capsules (600 mg total) by mouth at bedtime.   glucose blood (BAYER CONTOUR NEXT TEST) test strip Use as instructed   hydrocortisone (ANUSOL-HC) 25 MG suppository Place 1 suppository (25 mg total) rectally 2 (two) times daily.   hydroxypropyl methylcellulose / hypromellose (ISOPTO TEARS / GONIOVISC) 2.5 % ophthalmic  solution Place 1 drop into both eyes 3 (three) times daily as needed for dry eyes.   Insulin Pen Needle (PEN NEEDLES) 31G X 6 MM MISC Use lantus every night at hours of sleep (Patient not taking: Reported on 12/10/2020)   lidocaine (XYLOCAINE) 2 % solution Use as directed 15 mLs in the mouth or throat as needed for mouth pain. Swish and spit 28ms as needed for dental pain   MITIGARE 0.6 MG CAPS TAKE 2 CAPSULES BY MOUTH AT THE START OF GOUT ATTACK, MAY TAKE 1 CAPSULE AFTERWARDS IF SYMPTOMS PERSIST (Patient taking differently: Take 0.6 mg by mouth daily.)   naltrexone (DEPADE) 50 MG tablet Take 1 tablet (50 mg total) by mouth daily.   Omega-3 Fatty Acids (FISH OIL) 1200 MG CAPS Take 1,200 mg by mouth daily.    omeprazole (PRILOSEC) 20 MG capsule TAKE 1 CAPSULE(20 MG) BY MOUTH DAILY   OVER THE COUNTER MEDICATION Take 250 mg by mouth daily. Super Beta Prostate (Patient not taking: Reported on 12/10/2020)   potassium chloride SA (KLOR-CON M20) 20 MEQ tablet 1 tablet with food   sildenafil (VIAGRA) 50 MG tablet Take 1 tablet (50 mg total) by mouth daily as needed for erectile dysfunction. At least 24 hours between doses   thiamine (VITAMIN B-1) 100 MG tablet Take 100 mg by mouth daily.   triamcinolone cream (KENALOG) 0.1 % Apply 1 application topically 2 (two) times daily.   varenicline (CHANTIX PAK) 0.5 MG X 11 & 1 MG X 42 tablet Take  one 0.5 mg tablet by mouth once daily for 3 days, then increase to one 0.5 mg tablet twice daily for 4 days, then increase to one 1 mg tablet twice daily.   No facility-administered encounter medications on file as of 03/25/2021.    Functional Status:  In your present state of health, do you have any difficulty performing the following activities: 01/26/2021 12/10/2020  Hearing? Y Y  Comment - some HOH  Vision? Y Y  Comment - recent cataract surgery  Difficulty concentrating or making decisions? N N  Walking or climbing stairs? N N  Dressing or bathing? N N  Doing  errands, shopping? N N  Preparing Food and eating ? N N  Using the Toilet? N N  In the past six months, have you accidently leaked urine? N N  Do you have problems with loss of bowel control? N N  Managing your Medications? N N  Managing your Finances? N N  Housekeeping or managing your Housekeeping? N N  Some recent data might be hidden    Fall/Depression Screening: Fall Risk  01/26/2021 05/21/2020 01/15/2019  Falls in the past year? 1 0 0  Number falls in past yr: 1 0 -  Injury with Fall? 0 - -  Risk for fall due to : Impaired balance/gait - -  Follow up Education provided - -   PHQ 2/9 Scores 01/26/2021 12/25/2020 12/10/2020 05/21/2020 07/16/2019 01/15/2019 08/09/2018  PHQ - 2 Score 0 0 0 0 0 0 0  PHQ- 9 Score - 0 - 0 0 0 -    Assessment:   Care Plan Care Plan : Hypertension (Adult)  Updates made by Jon Billings, RN since 03/25/2021 12:00 AM     Problem: Hypertension (Hypertension)      Long-Range Goal: Hypertension Monitored as evidenced by blood pressure less than 140/80.   Start Date: 12/10/2020  Expected End Date: 01/01/2022  This Visit's Progress: On track  Recent Progress: On track  Priority: High  Note:   Evidence-based guidance:  Promote initial use of ambulatory blood pressure measurements (for 3 days) to rule out "white-coat" effect; identify masked hypertension and presence or absence of nocturnal "dipping" of blood pressure.   Encourage continued use of home blood pressure monitoring and recording in blood pressure log; include symptoms of hypotension or potential medication side effects in log.  Notes:     Task: Identify and Monitor Blood Pressure Elevation   Due Date: 07/04/2021  Priority: Routine  Responsible User: Jon Billings, RN  Note:   Care Management Activities:    - home or ambulatory blood pressure monitoring encouraged    Notes: 12/10/20 Blood pressure cuff ordered 01/15/21 Patient reports he has his blood pressure cuff and has been checking his  blood pressure.  He does not have his readings with but states it is less than 140/80.  Patient reports he is eating a low salt diet.  He has all his medications.   Hypertension Management Discussed Take medications as ordered Limit salt intake. Eat plenty of fruits and vegetables Remain as active as possible Take blood pressure at least weekly at home and record in a notebook to take to doctor's visits.  02/18/21 Patient reports doing good.  Does not have any blood pressure readings he can share but agrees that his blood pressure is less than 140/80.  Discussed hypertension management. No concerns.   03/25/21 Patient doing good.  Working again.  He reports his last blood pressure reading was good. He does  not remember the numbers. Encouraged to check blood pressure at least weekly.  He is agreeable.  Reiterated hypertension management and low salt diet.        Goals Addressed             This Visit's Progress    Track and Manage My Blood Pressure-Hypertension   On track    Barriers: Health Behaviors Knowledge Timeframe:  Long-Range Goal Priority:  High Start Date: 12/10/20                             Expected End Date:    01/01/22               Follow Up Date 05/04/21  - check blood pressure weekly - write blood pressure results in a log or diary    Why is this important?   You won't feel high blood pressure, but it can still hurt your blood vessels.  High blood pressure can cause heart or kidney problems. It can also cause a stroke.  Making lifestyle changes like losing a little weight or eating less salt will help.  Checking your blood pressure at home and at different times of the day can help to control blood pressure.  If the doctor prescribes medicine remember to take it the way the doctor ordered.  Call the office if you cannot afford the medicine or if there are questions about it.     Notes: 12/10/20 Patient does not have blood pressure cuff to monitor pressure.  Ordered  blood pressure cuff.  Discussed hypertension management: Take medications as ordered Limit salt intake. Eat plenty of fruits and vegetables Remain as active as possible Take blood pressure at least weekly at home and record in a notebook to take to doctors visits  01/15/21 Patient reports he has his blood pressure cuff and has been checking his blood pressure.  He does not have his readings with but states it is less than 140/80.  Patient reports he is eating a low salt diet.  He has all his medications.   Hypertension Management Reiterated Take medications as ordered Limit salt intake. Eat plenty of fruits and vegetables Remain as active as possible Take blood pressure at least weekly at home and record in a notebook to take to doctor's visits.  02/18/21 Patient reports doing good.  Does not have any blood pressure readings he can share but agrees that his blood pressure is less than 140/80.  Discussed hypertension management. No concerns.   03/25/21 Patient doing good.  Working again.  He reports his last blood pressure reading was good. He does not remember the numbers. Encouraged to check blood pressure at least weekly.  He is agreeable.  Reiterated hypertension management and low salt diet.        Plan:  Follow-up: Patient agrees to Care Plan and Follow-up. Follow-up in 3 month(s)  [name], RN, MSN [place] [facility] Care Management Coordinator Direct Line [phone] Cell [phone] Toll Free: [phone]  Fax: [phone]

## 2021-03-26 ENCOUNTER — Ambulatory Visit: Payer: Self-pay

## 2021-06-05 ENCOUNTER — Inpatient Hospital Stay: Payer: Medicare HMO | Attending: Internal Medicine

## 2021-06-08 ENCOUNTER — Telehealth: Payer: Self-pay | Admitting: Medical Oncology

## 2021-06-08 ENCOUNTER — Ambulatory Visit: Payer: Medicare HMO | Admitting: Internal Medicine

## 2021-06-08 NOTE — Telephone Encounter (Signed)
Pt thought his appt was 12/27. Schedule message sent.

## 2021-06-08 NOTE — Telephone Encounter (Signed)
Appt date mix up . [name] thought his appt was after scan. Schedule message sent.

## 2021-06-17 ENCOUNTER — Other Ambulatory Visit: Payer: Self-pay

## 2021-06-17 NOTE — Patient Outreach (Signed)
[place] [address] [facility]) Care Management  [name]  06/17/2021   [name] 17-Jul-1955 [id number]  Subjective: Telephone call to patient for follow up. He reports doing good.  He reports his blood pressure has been good.  Encouraged to continue hypertension management.  No concerns.    Objective:   Encounter Medications:  Outpatient Encounter Medications as of 06/17/2021  Medication Sig   acetaminophen (TYLENOL) 500 MG tablet Take 500 mg by mouth every 6 (six) hours as needed for moderate pain.   acetaZOLAMIDE (DIAMOX) 250 MG tablet Take 500 mg by mouth 2 (two) times daily.   allopurinol (ZYLOPRIM) 300 MG tablet Take 1 tablet (300 mg total) by mouth daily.   ALPHAGAN P 0.1 % SOLN Apply to eye.   amLODipine (NORVASC) 10 MG tablet 1 tablet   Apremilast 30 MG TABS Take 30 mg by mouth 2 (two) times daily.   atorvastatin (LIPITOR) 40 MG tablet Take 1 tablet (40 mg total) by mouth daily.   Blood Glucose Monitoring Suppl (BAYER CONTOUR NEXT MONITOR) w/Device KIT Use as directed   buPROPion (WELLBUTRIN XL) 150 MG 24 hr tablet TAKE 1 TABLET EVERY DAY   carvedilol (COREG) 3.125 MG tablet TAKE 1 TABLET TWICE DAILY WITH A MEAL   cetirizine (ZYRTEC) 10 MG tablet TAKE 1 TABLET(10 MG) BY MOUTH DAILY   clobetasol (OLUX) 0.05 % topical foam 1 application to affected area   coal tar (NEUTROGENA T-GEL) 0.5 % shampoo Apply 1 application topically daily.   dorzolamide-timolol (COSOPT) 22.3-6.8 MG/ML ophthalmic solution SMARTSIG:In Eye(s)   fluticasone (FLONASE) 50 MCG/ACT nasal spray SHAKE LIQUID AND USE 1 SPRAY IN EACH NOSTRIL DAILY (Patient taking differently: Place 1 spray into both nostrils daily.)   gabapentin (NEURONTIN) 300 MG capsule Take 2 capsules (600 mg total) by mouth at bedtime.   glucose blood (BAYER CONTOUR NEXT TEST) test strip Use as instructed   hydrocortisone (ANUSOL-HC) 25 MG suppository Place 1 suppository (25 mg total) rectally 2 (two) times daily.    hydroxypropyl methylcellulose / hypromellose (ISOPTO TEARS / GONIOVISC) 2.5 % ophthalmic solution Place 1 drop into both eyes 3 (three) times daily as needed for dry eyes.   Insulin Pen Needle (PEN NEEDLES) 31G X 6 MM MISC Use lantus every night at hours of sleep (Patient not taking: Reported on 12/10/2020)   lidocaine (XYLOCAINE) 2 % solution Use as directed 15 mLs in the mouth or throat as needed for mouth pain. Swish and spit 55mLs as needed for dental pain   MITIGARE 0.6 MG CAPS TAKE 2 CAPSULES BY MOUTH AT THE START OF GOUT ATTACK, MAY TAKE 1 CAPSULE AFTERWARDS IF SYMPTOMS PERSIST (Patient taking differently: Take 0.6 mg by mouth daily.)   naltrexone (DEPADE) 50 MG tablet Take 1 tablet (50 mg total) by mouth daily.   Omega-3 Fatty Acids (FISH OIL) 1200 MG CAPS Take 1,200 mg by mouth daily.    omeprazole (PRILOSEC) 20 MG capsule TAKE 1 CAPSULE(20 MG) BY MOUTH DAILY   OVER THE COUNTER MEDICATION Take 250 mg by mouth daily. Super Beta Prostate (Patient not taking: Reported on 12/10/2020)   potassium chloride SA (KLOR-CON M20) 20 MEQ tablet 1 tablet with food   sildenafil (VIAGRA) 50 MG tablet Take 1 tablet (50 mg total) by mouth daily as needed for erectile dysfunction. At least 24 hours between doses   thiamine (VITAMIN B-1) 100 MG tablet Take 100 mg by mouth daily.   triamcinolone cream (KENALOG) 0.1 % Apply 1 application topically 2 (two) times  daily.   varenicline (CHANTIX PAK) 0.5 MG X 11 & 1 MG X 42 tablet Take one 0.5 mg tablet by mouth once daily for 3 days, then increase to one 0.5 mg tablet twice daily for 4 days, then increase to one 1 mg tablet twice daily.   No facility-administered encounter medications on file as of 06/17/2021.    Functional Status:  In your present state of health, do you have any difficulty performing the following activities: 06/17/2021 01/26/2021  Hearing? Y Y  Comment some [place]? Y Y  Comment recent cataract surgery -  Difficulty concentrating or making  decisions? N N  Walking or climbing stairs? N N  Dressing or bathing? N N  Doing errands, shopping? N N  Preparing Food and eating ? N N  Using the Toilet? N N  In the past six months, have you accidently leaked urine? N N  Do you have problems with loss of bowel control? N N  Managing your Medications? N N  Managing your Finances? N N  Housekeeping or managing your Housekeeping? N N  Some recent data might be hidden    Fall/Depression Screening: Fall Risk  01/26/2021 05/21/2020 01/15/2019  Falls in the past year? 1 0 0  Number falls in past yr: 1 0 -  Injury with Fall? 0 - -  Risk for fall due to : Impaired balance/gait - -  Follow up Education provided - -   PHQ 2/9 Scores 06/17/2021 01/26/2021 12/25/2020 12/10/2020 05/21/2020 07/16/2019 01/15/2019  PHQ - 2 Score 0 0 0 0 0 0 0  PHQ- 9 Score - - 0 - 0 0 0    Assessment:   Care Plan Care Plan : Hypertension (Adult)  Updates made by Jon Billings, RN since 06/17/2021 12:00 AM     Problem: Hypertension (Hypertension)      Long-Range Goal: Hypertension Monitored as evidenced by blood pressure less than 140/80. Completed 06/17/2021  Start Date: 12/10/2020  Expected End Date: 01/01/2022  Recent Progress: On track  Priority: High  Note:   Evidence-based guidance:  Promote initial use of ambulatory blood pressure measurements (for 3 days) to rule out "white-coat" effect; identify masked hypertension and presence or absence of nocturnal "dipping" of blood pressure.   Encourage continued use of home blood pressure monitoring and recording in blood pressure log; include symptoms of hypotension or potential medication side effects in log.  Notes: 23/55/73 Resolving duplicate goal    Task: Identify and Monitor Blood Pressure Elevation Completed 06/17/2021  Due Date: 07/04/2021  Priority: Routine  Responsible User: Jon Billings, RN  Note:   Care Management Activities:    - home or ambulatory blood pressure monitoring encouraged     Notes: 12/10/20 Blood pressure cuff ordered 01/15/21 Patient reports he has his blood pressure cuff and has been checking his blood pressure.  He does not have his readings with but states it is less than 140/80.  Patient reports he is eating a low salt diet.  He has all his medications.   Hypertension Management Discussed Take medications as ordered Limit salt intake. Eat plenty of fruits and vegetables Remain as active as possible Take blood pressure at least weekly at home and record in a notebook to take to doctor's visits.  02/18/21 Patient reports doing good.  Does not have any blood pressure readings he can share but agrees that his blood pressure is less than 140/80.  Discussed hypertension management. No concerns.   03/25/21 Patient doing  good.  Working again.  He reports his last blood pressure reading was good. He does not remember the numbers. Encouraged to check blood pressure at least weekly.  He is agreeable.  Reiterated hypertension management and low salt diet.      Care Plan : RN Care Manager Plan of Care  Updates made by Jon Billings, RN since 06/17/2021 12:00 AM     Problem: Chronic Disease Management and Care Coordination of HTN   Priority: High     Long-Range Goal: Development of Plan of Care for management of HTN   Start Date: 06/17/2021  Expected End Date: 07/04/2022  Priority: High  Note:   Current Barriers:  Knowledge Deficits related to plan of care for management of HTN  Chronic Disease Management support and education needs related to HTN   RNCM Clinical Goal(s):  Patient will verbalize basic understanding of  HTN disease process and self health management plan as evidenced by Patient reporting blood pressure less than 140/80 continue to work with [facility] to address care management and care coordination needs related to  HTN as evidenced by adherence to CM Team Scheduled appointments through collaboration with RN Care manager, provider, and care team.    Interventions: Education and support related to HTN management Inter-disciplinary care team collaboration (see longitudinal plan of care) Evaluation of current treatment plan related to  self management and patient's adherence to plan as established by provider   Hypertension Interventions:  (Status:  New goal.) Long Term Goal Last practice recorded BP readings:  BP Readings from Last 3 Encounters:  01/26/21 (!) 145/104  12/25/20 136/87  12/05/20 (!) 152/86  Most recent eGFR/CrCl:  Lab Results  Component Value Date   EGFR >60 07/07/2017    No components found for: CRCL  Evaluation of current treatment plan related to hypertension self management and patient's adherence to plan as established by provider Discussed plans with patient for ongoing care management follow up and provided patient with direct contact information for care management team Discussed diet and importance of limiting salt intake.   Patient Goals/Self-Care Activities: Take all medications as prescribed check blood pressure weekly write blood pressure results in a log or diary take blood pressure log to all doctor appointments eat more whole grains, fruits and vegetables, lean meats and healthy fats Limit salt intake  Follow Up Plan:  Telephone follow up appointment with care management team member scheduled for:  March The patient has been provided with contact information for the care management team and has been advised to call with any health related questions or concerns.        Goals Addressed             This Visit's Progress    Track and Manage My Blood Pressure-Hypertension   On track    Barriers: Health Behaviors Knowledge Timeframe:  Long-Range Goal Priority:  High Start Date: 12/10/20                             Expected End Date:    01/01/22               Follow Up Date 05/04/21  - check blood pressure weekly - write blood pressure results in a log or diary    Why is this  important?   You won't feel high blood pressure, but it can still hurt your blood vessels.  High blood pressure can cause heart or  kidney problems. It can also cause a stroke.  Making lifestyle changes like losing a little weight or eating less salt will help.  Checking your blood pressure at home and at different times of the day can help to control blood pressure.  If the doctor prescribes medicine remember to take it the way the doctor ordered.  Call the office if you cannot afford the medicine or if there are questions about it.     Notes: 12/10/20 Patient does not have blood pressure cuff to monitor pressure.  Ordered blood pressure cuff.  Discussed hypertension management: Take medications as ordered Limit salt intake. Eat plenty of fruits and vegetables Remain as active as possible Take blood pressure at least weekly at home and record in a notebook to take to doctors visits  01/15/21 Patient reports he has his blood pressure cuff and has been checking his blood pressure.  He does not have his readings with but states it is less than 140/80.  Patient reports he is eating a low salt diet.  He has all his medications.   Hypertension Management Reiterated Take medications as ordered Limit salt intake. Eat plenty of fruits and vegetables Remain as active as possible Take blood pressure at least weekly at home and record in a notebook to take to doctor's visits.  02/18/21 Patient reports doing good.  Does not have any blood pressure readings he can share but agrees that his blood pressure is less than 140/80.  Discussed hypertension management. No concerns.   03/25/21 Patient doing good.  Working again.  He reports his last blood pressure reading was good. He does not remember the numbers. Encouraged to check blood pressure at least weekly.  He is agreeable.  Reiterated hypertension management and low salt diet. 06/17/21 Patient reports blood pressure doing good. No new readings reported by  patient.   Resolving duplicate goal        Plan:  Follow-up: Patient agrees to Care Plan and Follow-up. Follow-up in 3 month(s)  [name], RN, MSN [place] [facility] Care Management Coordinator Direct Line [phone] Cell [phone] Toll Free: [phone]  Fax: [phone]

## 2021-06-17 NOTE — Patient Instructions (Signed)
Patient Goals/Self-Care Activities: Take all medications as prescribed check blood pressure weekly write blood pressure results in a log or diary take blood pressure log to all doctor appointments eat more whole grains, fruits and vegetables, lean meats and healthy fats Limit salt intake

## 2021-06-23 ENCOUNTER — Ambulatory Visit: Payer: Medicare HMO | Admitting: Family Medicine

## 2021-06-24 ENCOUNTER — Other Ambulatory Visit: Payer: Self-pay | Admitting: Family Medicine

## 2021-06-24 DIAGNOSIS — K219 Gastro-esophageal reflux disease without esophagitis: Secondary | ICD-10-CM

## 2021-06-25 ENCOUNTER — Inpatient Hospital Stay: Payer: Medicare HMO

## 2021-06-25 ENCOUNTER — Ambulatory Visit (HOSPITAL_COMMUNITY)
Admission: RE | Admit: 2021-06-25 | Discharge: 2021-06-25 | Disposition: A | Discharge status: Home / Self Care | Payer: Medicare HMO | Source: Ambulatory Visit | Attending: Physician Assistant | Admitting: Physician Assistant

## 2021-06-25 ENCOUNTER — Other Ambulatory Visit: Payer: Self-pay

## 2021-06-25 DIAGNOSIS — R911 Solitary pulmonary nodule: Secondary | ICD-10-CM | POA: Diagnosis not present

## 2021-06-25 DIAGNOSIS — C3491 Malignant neoplasm of unspecified part of right bronchus or lung: Secondary | ICD-10-CM | POA: Diagnosis not present

## 2021-06-25 DIAGNOSIS — I700 Atherosclerosis of aorta: Secondary | ICD-10-CM | POA: Diagnosis not present

## 2021-06-25 DIAGNOSIS — C3490 Malignant neoplasm of unspecified part of unspecified bronchus or lung: Secondary | ICD-10-CM | POA: Diagnosis not present

## 2021-06-25 DIAGNOSIS — J439 Emphysema, unspecified: Secondary | ICD-10-CM | POA: Diagnosis not present

## 2021-06-25 LAB — CMP (CANCER CENTER ONLY)
ALT: 14 U/L (ref 0–44)
AST: 36 U/L (ref 15–41)
Albumin: 3.3 g/dL — ABNORMAL LOW (ref 3.5–5.0)
Alkaline Phosphatase: 125 U/L (ref 38–126)
Anion gap: 11 (ref 5–15)
BUN: 20 mg/dL (ref 8–23)
CO2: 25 mmol/L (ref 22–32)
Calcium: 8.8 mg/dL — ABNORMAL LOW (ref 8.9–10.3)
Chloride: 101 mmol/L (ref 98–111)
Creatinine: 1.18 mg/dL (ref 0.61–1.24)
GFR, Estimated: >60 mL/min (ref >60)
Glucose, Bld: 78 mg/dL (ref 70–99)
Potassium: 3.5 mmol/L (ref 3.5–5.1)
Sodium: 137 mmol/L (ref 135–145)
Total Bilirubin: 0.6 mg/dL (ref 0.3–1.2)
Total Protein: 7.4 g/dL (ref 6.5–8.1)

## 2021-06-25 LAB — CBC WITH DIFFERENTIAL (CANCER CENTER ONLY)
Abs Immature Granulocytes: 0.01 10*3/uL (ref 0.00–0.07)
Basophils Absolute: 0.1 10*3/uL (ref 0.0–0.1)
Basophils Relative: 2 %
Eosinophils Absolute: 0.1 10*3/uL (ref 0.0–0.5)
Eosinophils Relative: 3 %
HCT: 28.9 % — ABNORMAL LOW (ref 39.0–52.0)
Hemoglobin: 9.4 g/dL — ABNORMAL LOW (ref 13.0–17.0)
Immature Granulocytes: 0 %
Lymphocytes Relative: 24 %
Lymphs Abs: 0.9 10*3/uL (ref 0.7–4.0)
MCH: 27.3 pg (ref 26.0–34.0)
MCHC: 32.5 g/dL (ref 30.0–36.0)
MCV: 84 fL (ref 80.0–100.0)
Monocytes Absolute: 0.3 10*3/uL (ref 0.1–1.0)
Monocytes Relative: 9 %
Neutro Abs: 2.2 10*3/uL (ref 1.7–7.7)
Neutrophils Relative %: 62 %
Platelet Count: 154 10*3/uL (ref 150–400)
RBC: 3.44 MIL/uL — ABNORMAL LOW (ref 4.22–5.81)
RDW: 18.5 % — ABNORMAL HIGH (ref 11.5–15.5)
WBC Count: 3.6 10*3/uL — ABNORMAL LOW (ref 4.0–10.5)
nRBC: 0 % (ref 0.0–0.2)

## 2021-06-30 ENCOUNTER — Ambulatory Visit: Payer: Medicare HMO | Admitting: Pharmacist

## 2021-06-30 ENCOUNTER — Ambulatory Visit: Payer: Medicare HMO | Admitting: Family Medicine

## 2021-07-02 ENCOUNTER — Encounter: Payer: Self-pay | Admitting: Family Medicine

## 2021-07-02 ENCOUNTER — Ambulatory Visit: Payer: Medicare HMO | Attending: Family Medicine | Admitting: Family Medicine

## 2021-07-02 ENCOUNTER — Telehealth: Payer: Self-pay

## 2021-07-02 ENCOUNTER — Other Ambulatory Visit: Payer: Self-pay

## 2021-07-02 VITALS — BP 132/86 | HR 79 | Ht 74.0 in | Wt 179.0 lb

## 2021-07-02 DIAGNOSIS — Z23 Encounter for immunization: Secondary | ICD-10-CM

## 2021-07-02 DIAGNOSIS — I152 Hypertension secondary to endocrine disorders: Secondary | ICD-10-CM

## 2021-07-02 DIAGNOSIS — E1169 Type 2 diabetes mellitus with other specified complication: Secondary | ICD-10-CM

## 2021-07-02 DIAGNOSIS — E1149 Type 2 diabetes mellitus with other diabetic neurological complication: Secondary | ICD-10-CM

## 2021-07-02 DIAGNOSIS — I700 Atherosclerosis of aorta: Secondary | ICD-10-CM

## 2021-07-02 DIAGNOSIS — E785 Hyperlipidemia, unspecified: Secondary | ICD-10-CM

## 2021-07-02 DIAGNOSIS — M1A9XX0 Chronic gout, unspecified, without tophus (tophi): Secondary | ICD-10-CM | POA: Diagnosis not present

## 2021-07-02 DIAGNOSIS — K219 Gastro-esophageal reflux disease without esophagitis: Secondary | ICD-10-CM

## 2021-07-02 DIAGNOSIS — E1159 Type 2 diabetes mellitus with other circulatory complications: Secondary | ICD-10-CM

## 2021-07-02 DIAGNOSIS — R350 Frequency of micturition: Secondary | ICD-10-CM | POA: Diagnosis not present

## 2021-07-02 DIAGNOSIS — M545 Low back pain, unspecified: Secondary | ICD-10-CM | POA: Diagnosis not present

## 2021-07-02 DIAGNOSIS — R822 Biliuria: Secondary | ICD-10-CM

## 2021-07-02 LAB — POCT URINALYSIS DIP (CLINITEK)
Blood, UA: NEGATIVE
Glucose, UA: NEGATIVE mg/dL
Leukocytes, UA: NEGATIVE
Nitrite, UA: NEGATIVE
POC PROTEIN,UA: 100 — AB
Spec Grav, UA: 1.02 (ref 1.010–1.025)
Urobilinogen, UA: 2 E.U./dL — AB
pH, UA: 6 (ref 5.0–8.0)

## 2021-07-02 LAB — POCT GLYCOSYLATED HEMOGLOBIN (HGB A1C): Hemoglobin A1C: 4.5 % (ref 4.0–5.6)

## 2021-07-02 MED ORDER — ZOSTER VAC RECOMB ADJUVANTED 50 MCG/0.5ML IM SUSR: 0.5000 mL | Freq: Once | INTRAMUSCULAR | 1 refills | Status: DC

## 2021-07-02 MED ORDER — AMLODIPINE BESYLATE 10 MG PO TABS: ORAL_TABLET | ORAL | 1 refills | Status: DC

## 2021-07-02 MED ORDER — ALLOPURINOL 300 MG PO TABS: 300.0000 mg | ORAL_TABLET | Freq: Every day | ORAL | 6 refills | Status: DC

## 2021-07-02 MED ORDER — ZOSTER VAC RECOMB ADJUVANTED 50 MCG/0.5ML IM SUSR: 0.5000 mL | Freq: Once | INTRAMUSCULAR | 1 refills | Status: AC

## 2021-07-02 MED ORDER — TIZANIDINE HCL 4 MG PO TABS: 4.0000 mg | ORAL_TABLET | Freq: Three times a day (TID) | ORAL | 1 refills | Status: DC | PRN

## 2021-07-02 MED ORDER — ATORVASTATIN CALCIUM 40 MG PO TABS: 40.0000 mg | ORAL_TABLET | Freq: Every day | ORAL | 1 refills | Status: DC

## 2021-07-02 MED ORDER — CARVEDILOL 3.125 MG PO TABS: ORAL_TABLET | ORAL | 1 refills | Status: DC

## 2021-07-02 MED ORDER — GABAPENTIN 300 MG PO CAPS: 600.0000 mg | ORAL_CAPSULE | Freq: Every day | ORAL | 1 refills | Status: DC

## 2021-07-02 MED ORDER — OMEPRAZOLE 20 MG PO CPDR: DELAYED_RELEASE_CAPSULE | ORAL | 1 refills | Status: DC

## 2021-07-02 NOTE — Telephone Encounter (Signed)
After hours message received regarding pt requesting to know when his next appt is.   I have called the pt back and left a detailed message advising his appt is Tuesday 07/07/21 with an arrival time of 10:15am.

## 2021-07-02 NOTE — Progress Notes (Signed)
Frequent urination Lower back pain. 2 pharmacies on files.
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
## 2021-07-03 ENCOUNTER — Telehealth: Payer: Self-pay

## 2021-07-03 ENCOUNTER — Encounter: Payer: Self-pay | Admitting: Family Medicine

## 2021-07-03 LAB — PSA, TOTAL AND FREE
PSA, Free Pct: 40 %
PSA, Free: 0.2 ng/mL
Prostate Specific Ag, Serum: 0.5 ng/mL (ref 0.0–4.0)

## 2021-07-03 NOTE — Telephone Encounter (Signed)
-----   Message from [name], MD sent at 07/03/2021  9:41 AM EST ----- Please inform the patient that labs are normal. Thank you.

## 2021-07-03 NOTE — Telephone Encounter (Signed)
Patient name and DOB has been verified Patient was informed of lab results. Patient had no questions.  

## 2021-07-07 ENCOUNTER — Telehealth: Payer: Self-pay | Admitting: Internal Medicine

## 2021-07-07 ENCOUNTER — Other Ambulatory Visit: Payer: Self-pay

## 2021-07-07 ENCOUNTER — Encounter: Payer: Self-pay | Admitting: Internal Medicine

## 2021-07-07 ENCOUNTER — Inpatient Hospital Stay: Payer: Medicare HMO | Attending: Internal Medicine | Admitting: Internal Medicine

## 2021-07-07 VITALS — BP 137/87 | HR 81 | Temp 97.5°F | Resp 19 | Ht 74.0 in | Wt 183.1 lb

## 2021-07-07 DIAGNOSIS — C3431 Malignant neoplasm of lower lobe, right bronchus or lung: Secondary | ICD-10-CM | POA: Insufficient documentation

## 2021-07-07 DIAGNOSIS — F17210 Nicotine dependence, cigarettes, uncomplicated: Secondary | ICD-10-CM | POA: Diagnosis not present

## 2021-07-07 DIAGNOSIS — Z79899 Other long term (current) drug therapy: Secondary | ICD-10-CM | POA: Diagnosis not present

## 2021-07-07 DIAGNOSIS — C3490 Malignant neoplasm of unspecified part of unspecified bronchus or lung: Secondary | ICD-10-CM

## 2021-07-07 DIAGNOSIS — K7460 Unspecified cirrhosis of liver: Secondary | ICD-10-CM | POA: Diagnosis not present

## 2021-07-07 DIAGNOSIS — R222 Localized swelling, mass and lump, trunk: Secondary | ICD-10-CM | POA: Diagnosis not present

## 2021-07-07 NOTE — Telephone Encounter (Signed)
Scheduled appt per 1/3 los - mailed letter with appt date and time
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
## 2021-07-10 ENCOUNTER — Ambulatory Visit: Payer: Medicare HMO | Attending: Family Medicine

## 2021-07-10 ENCOUNTER — Other Ambulatory Visit: Payer: Self-pay

## 2021-07-10 ENCOUNTER — Ambulatory Visit: Payer: Medicare HMO

## 2021-07-10 DIAGNOSIS — R822 Biliuria: Secondary | ICD-10-CM

## 2021-07-10 LAB — POCT URINALYSIS DIP (CLINITEK)
Bilirubin, UA: NEGATIVE
Blood, UA: NEGATIVE
Glucose, UA: NEGATIVE mg/dL
Ketones, POC UA: NEGATIVE mg/dL
Leukocytes, UA: NEGATIVE
Nitrite, UA: NEGATIVE
Spec Grav, UA: 1.015 (ref 1.010–1.025)
Urobilinogen, UA: 1 E.U./dL
pH, UA: 6.5 (ref 5.0–8.0)

## 2021-07-15 ENCOUNTER — Telehealth: Payer: Self-pay

## 2021-07-15 NOTE — Telephone Encounter (Signed)
-----   Message from [name], MD sent at 07/10/2021  7:18 PM EST ----- Please inform him his urine is normal

## 2021-07-15 NOTE — Telephone Encounter (Signed)
Call placed and patient has been informed of normal lab results.

## 2021-08-13 DIAGNOSIS — H40051 Ocular hypertension, right eye: Secondary | ICD-10-CM | POA: Diagnosis not present

## 2021-08-13 DIAGNOSIS — H35362 Drusen (degenerative) of macula, left eye: Secondary | ICD-10-CM | POA: Diagnosis not present

## 2021-08-13 DIAGNOSIS — H18231 Secondary corneal edema, right eye: Secondary | ICD-10-CM | POA: Diagnosis not present

## 2021-08-13 DIAGNOSIS — H31011 Macula scars of posterior pole (postinflammatory) (post-traumatic), right eye: Secondary | ICD-10-CM | POA: Diagnosis not present

## 2021-08-13 DIAGNOSIS — H1045 Other chronic allergic conjunctivitis: Secondary | ICD-10-CM | POA: Diagnosis not present

## 2021-08-13 DIAGNOSIS — H338 Other retinal detachments: Secondary | ICD-10-CM | POA: Diagnosis not present

## 2021-09-09 ENCOUNTER — Other Ambulatory Visit: Payer: Self-pay

## 2021-09-09 NOTE — Patient Outreach (Signed)
[facility]) Care Management ? ?09/09/2021 ? ?[name] ?Mar 16, 1956 ?[id number] ? ? ?Telephone call to patient for disease management follow up.   No answer.  HIPAA compliant voice message left.   ? ?Plan: If no return call, RN CM will attempt patient again in June. ? ?[name], RN, MSN ?[facility] Care Management ?Care Management Coordinator ?Direct Line [phone] ?Toll Free: [phone]  ?Fax: [phone] ? ?

## 2021-10-01 ENCOUNTER — Other Ambulatory Visit: Payer: Self-pay

## 2021-10-01 ENCOUNTER — Ambulatory Visit (HOSPITAL_COMMUNITY)
Admission: RE | Admit: 2021-10-01 | Discharge: 2021-10-01 | Disposition: A | Discharge status: Home / Self Care | Payer: Medicare HMO | Source: Ambulatory Visit | Attending: Internal Medicine | Admitting: Internal Medicine

## 2021-10-01 ENCOUNTER — Inpatient Hospital Stay: Payer: Medicare HMO | Attending: Internal Medicine

## 2021-10-01 DIAGNOSIS — J432 Centrilobular emphysema: Secondary | ICD-10-CM | POA: Diagnosis not present

## 2021-10-01 DIAGNOSIS — C3490 Malignant neoplasm of unspecified part of unspecified bronchus or lung: Secondary | ICD-10-CM

## 2021-10-01 DIAGNOSIS — R918 Other nonspecific abnormal finding of lung field: Secondary | ICD-10-CM | POA: Diagnosis not present

## 2021-10-01 DIAGNOSIS — J8410 Pulmonary fibrosis, unspecified: Secondary | ICD-10-CM | POA: Diagnosis not present

## 2021-10-01 LAB — CBC WITH DIFFERENTIAL (CANCER CENTER ONLY)
Abs Immature Granulocytes: 0.01 10*3/uL (ref 0.00–0.07)
Basophils Absolute: 0 10*3/uL (ref 0.0–0.1)
Basophils Relative: 1 %
Eosinophils Absolute: 0.1 10*3/uL (ref 0.0–0.5)
Eosinophils Relative: 3 %
HCT: 33.6 % — ABNORMAL LOW (ref 39.0–52.0)
Hemoglobin: 11.1 g/dL — ABNORMAL LOW (ref 13.0–17.0)
Immature Granulocytes: 0 %
Lymphocytes Relative: 17 %
Lymphs Abs: 0.6 10*3/uL — ABNORMAL LOW (ref 0.7–4.0)
MCH: 30.2 pg (ref 26.0–34.0)
MCHC: 33 g/dL (ref 30.0–36.0)
MCV: 91.3 fL (ref 80.0–100.0)
Monocytes Absolute: 0.4 10*3/uL (ref 0.1–1.0)
Monocytes Relative: 11 %
Neutro Abs: 2.3 10*3/uL (ref 1.7–7.7)
Neutrophils Relative %: 68 %
Platelet Count: 127 10*3/uL — ABNORMAL LOW (ref 150–400)
RBC: 3.68 MIL/uL — ABNORMAL LOW (ref 4.22–5.81)
RDW: 18.9 % — ABNORMAL HIGH (ref 11.5–15.5)
WBC Count: 3.4 10*3/uL — ABNORMAL LOW (ref 4.0–10.5)
nRBC: 0 % (ref 0.0–0.2)

## 2021-10-01 LAB — CMP (CANCER CENTER ONLY)
ALT: 10 U/L (ref 0–44)
AST: 18 U/L (ref 15–41)
Albumin: 3.5 g/dL (ref 3.5–5.0)
Alkaline Phosphatase: 143 U/L — ABNORMAL HIGH (ref 38–126)
Anion gap: 8 (ref 5–15)
BUN: 22 mg/dL (ref 8–23)
CO2: 28 mmol/L (ref 22–32)
Calcium: 9.1 mg/dL (ref 8.9–10.3)
Chloride: 101 mmol/L (ref 98–111)
Creatinine: 1.39 mg/dL — ABNORMAL HIGH (ref 0.61–1.24)
GFR, Estimated: 56 mL/min — ABNORMAL LOW (ref >60)
Glucose, Bld: 82 mg/dL (ref 70–99)
Potassium: 3.5 mmol/L (ref 3.5–5.1)
Sodium: 137 mmol/L (ref 135–145)
Total Bilirubin: 0.7 mg/dL (ref 0.3–1.2)
Total Protein: 8.4 g/dL — ABNORMAL HIGH (ref 6.5–8.1)

## 2021-10-02 ENCOUNTER — Other Ambulatory Visit: Payer: Medicare HMO

## 2021-10-05 ENCOUNTER — Inpatient Hospital Stay: Payer: Medicare HMO | Attending: Internal Medicine | Admitting: Internal Medicine

## 2021-10-05 ENCOUNTER — Other Ambulatory Visit: Payer: Self-pay

## 2021-10-05 VITALS — BP 163/98 | HR 85 | Temp 97.8°F | Resp 18 | Ht 74.0 in | Wt 187.0 lb

## 2021-10-05 DIAGNOSIS — C3490 Malignant neoplasm of unspecified part of unspecified bronchus or lung: Secondary | ICD-10-CM

## 2021-10-05 DIAGNOSIS — Z79899 Other long term (current) drug therapy: Secondary | ICD-10-CM | POA: Diagnosis not present

## 2021-10-05 DIAGNOSIS — Z85118 Personal history of other malignant neoplasm of bronchus and lung: Secondary | ICD-10-CM | POA: Insufficient documentation

## 2021-10-05 DIAGNOSIS — C3491 Malignant neoplasm of unspecified part of right bronchus or lung: Secondary | ICD-10-CM

## 2021-10-05 DIAGNOSIS — I10 Essential (primary) hypertension: Secondary | ICD-10-CM | POA: Insufficient documentation

## 2021-10-05 DIAGNOSIS — R590 Localized enlarged lymph nodes: Secondary | ICD-10-CM | POA: Diagnosis not present

## 2021-10-05 DIAGNOSIS — C3431 Malignant neoplasm of lower lobe, right bronchus or lung: Secondary | ICD-10-CM
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
## 2021-11-02 DIAGNOSIS — H31011 Macula scars of posterior pole (postinflammatory) (post-traumatic), right eye: Secondary | ICD-10-CM | POA: Diagnosis not present

## 2021-11-02 DIAGNOSIS — H338 Other retinal detachments: Secondary | ICD-10-CM | POA: Diagnosis not present

## 2021-11-02 DIAGNOSIS — H25812 Combined forms of age-related cataract, left eye: Secondary | ICD-10-CM | POA: Diagnosis not present

## 2021-11-02 DIAGNOSIS — H35362 Drusen (degenerative) of macula, left eye: Secondary | ICD-10-CM | POA: Diagnosis not present

## 2021-11-02 DIAGNOSIS — H40002 Preglaucoma, unspecified, left eye: Secondary | ICD-10-CM | POA: Diagnosis not present

## 2021-11-02 DIAGNOSIS — H53411 Scotoma involving central area, right eye: Secondary | ICD-10-CM | POA: Diagnosis not present

## 2021-11-02 DIAGNOSIS — H59811 Chorioretinal scars after surgery for detachment, right eye: Secondary | ICD-10-CM | POA: Diagnosis not present

## 2021-11-02 DIAGNOSIS — Z961 Presence of intraocular lens: Secondary | ICD-10-CM | POA: Diagnosis not present

## 2021-11-18 ENCOUNTER — Other Ambulatory Visit: Payer: Self-pay | Admitting: Pharmacist

## 2021-11-18 DIAGNOSIS — F1010 Alcohol abuse, uncomplicated: Secondary | ICD-10-CM

## 2021-11-18 MED ORDER — NALTREXONE HCL 50 MG PO TABS: 50.0000 mg | ORAL_TABLET | Freq: Every day | ORAL | 0 refills | Status: DC

## 2021-12-01 DIAGNOSIS — E113292 Type 2 diabetes mellitus with mild nonproliferative diabetic retinopathy without macular edema, left eye: Secondary | ICD-10-CM | POA: Diagnosis not present

## 2021-12-01 DIAGNOSIS — Z9841 Cataract extraction status, right eye: Secondary | ICD-10-CM | POA: Diagnosis not present

## 2021-12-01 DIAGNOSIS — H31011 Macula scars of posterior pole (postinflammatory) (post-traumatic), right eye: Secondary | ICD-10-CM | POA: Diagnosis not present

## 2021-12-01 DIAGNOSIS — H25812 Combined forms of age-related cataract, left eye: Secondary | ICD-10-CM | POA: Diagnosis not present

## 2021-12-01 DIAGNOSIS — H40023 Open angle with borderline findings, high risk, bilateral: Secondary | ICD-10-CM | POA: Diagnosis not present

## 2021-12-09 ENCOUNTER — Other Ambulatory Visit: Payer: Self-pay

## 2021-12-09 NOTE — Patient Outreach (Addendum)
[facility]) Care Management  12/09/2021  [name] 02-11-1956 [id number]   Telephone call to patient for disease management follow up.   No answer.  HIPAA compliant voice message left.    Plan: RN CM will send letter.   If no return call, RN CM will attempt patient again September.  [name], RN, MSN [facility] Care Management Care Management Coordinator Direct Line [phone] Toll Free: [phone]  Fax: [phone]

## 2021-12-21 ENCOUNTER — Emergency Department (HOSPITAL_COMMUNITY)
Admission: EM | Admit: 2021-12-21 | Discharge: 2021-12-21 | Disposition: A | Discharge status: Home / Self Care | Payer: Medicare HMO | Attending: Emergency Medicine | Admitting: Emergency Medicine

## 2021-12-21 ENCOUNTER — Emergency Department (HOSPITAL_COMMUNITY): Payer: Medicare HMO

## 2021-12-21 ENCOUNTER — Encounter (HOSPITAL_COMMUNITY): Payer: Self-pay

## 2021-12-21 DIAGNOSIS — M7989 Other specified soft tissue disorders: Secondary | ICD-10-CM | POA: Diagnosis not present

## 2021-12-21 DIAGNOSIS — E119 Type 2 diabetes mellitus without complications: Secondary | ICD-10-CM | POA: Diagnosis not present

## 2021-12-21 DIAGNOSIS — W57XXXA Bitten or stung by nonvenomous insect and other nonvenomous arthropods, initial encounter: Secondary | ICD-10-CM | POA: Insufficient documentation

## 2021-12-21 DIAGNOSIS — I10 Essential (primary) hypertension: Secondary | ICD-10-CM | POA: Insufficient documentation

## 2021-12-21 DIAGNOSIS — S50862A Insect bite (nonvenomous) of left forearm, initial encounter: Secondary | ICD-10-CM | POA: Insufficient documentation

## 2021-12-21 DIAGNOSIS — Z794 Long term (current) use of insulin: Secondary | ICD-10-CM | POA: Diagnosis not present

## 2021-12-21 DIAGNOSIS — S50362A Insect bite (nonvenomous) of left elbow, initial encounter: Secondary | ICD-10-CM | POA: Diagnosis not present

## 2021-12-21 DIAGNOSIS — L03114 Cellulitis of left upper limb: Secondary | ICD-10-CM | POA: Diagnosis not present

## 2021-12-21 DIAGNOSIS — Z79899 Other long term (current) drug therapy: Secondary | ICD-10-CM | POA: Diagnosis not present

## 2021-12-21 MED ORDER — CEPHALEXIN 500 MG PO CAPS
500.0000 mg | ORAL_CAPSULE | Freq: Four times a day (QID) | ORAL | 0 refills | Status: DC
Start: 2021-12-21 — End: 2021-12-31

## 2021-12-21 MED ORDER — CEPHALEXIN 500 MG PO CAPS
500.0000 mg | ORAL_CAPSULE | Freq: Once | ORAL | Status: AC
  Administered 2021-12-21: 500 mg via ORAL
  Filled 2021-12-21: qty 1

## 2021-12-21 NOTE — ED Provider Notes (Signed)
[facility] DEPT Provider Note   CSN: [id number] Arrival date & time: 12/21/21  1803     History {Add pertinent medical, surgical, social history, OB history to HPI:1} Chief Complaint  Patient presents with   Insect Bite    [name] is a 66 y.o. male.   Notes that he was outside yesterday when something bit his left forearm.  No meds tried prior to arrival.  Denies fever, chills, nausea, vomiting, elbow pain.  HPI     Home Medications Prior to Admission medications   Medication Sig Start Date End Date Taking? Authorizing Provider  acetaminophen (TYLENOL) 500 MG tablet Take 500 mg by mouth every 6 (six) hours as needed for moderate pain.    [provider]  acetaZOLAMIDE (DIAMOX) 250 MG tablet Take 500 mg by mouth 2 (two) times daily. 11/26/20   [provider]  allopurinol (ZYLOPRIM) 300 MG tablet Take 1 tablet (300 mg total) by mouth daily. 07/02/21   [name], MD  ALPHAGAN P 0.1 % SOLN Apply to eye. 12/04/20   [provider]  amLODipine (NORVASC) 10 MG tablet 1 tablet 07/02/21   [name], MD  Apremilast 30 MG TABS Take 30 mg by mouth 2 (two) times daily.    [provider]  atorvastatin (LIPITOR) 40 MG tablet Take 1 tablet (40 mg total) by mouth daily. 07/02/21   [name], MD  Blood Glucose Monitoring Suppl (BAYER CONTOUR NEXT MONITOR) w/Device KIT Use as directed 05/17/16   Tresa Garter, MD  buPROPion (WELLBUTRIN XL) 150 MG 24 hr tablet TAKE 1 TABLET EVERY DAY 10/06/20   [name], MD  carvedilol (COREG) 3.125 MG tablet TAKE 1 TABLET TWICE DAILY WITH A MEAL 07/02/21   [name], MD  cetirizine (ZYRTEC) 10 MG tablet TAKE 1 TABLET(10 MG) BY MOUTH DAILY 02/19/20   [name], MD  clobetasol (OLUX) 0.05 % topical foam 1 application to affected area    [provider]  coal tar (NEUTROGENA T-GEL) 0.5 % shampoo Apply 1 application topically daily.    [provider]  dorzolamide-timolol (COSOPT) 22.3-6.8 MG/ML ophthalmic solution SMARTSIG:In Eye(s) 12/04/20   [provider]  fluticasone (FLONASE) 50 MCG/ACT nasal spray SHAKE LIQUID AND USE 1 SPRAY IN EACH NOSTRIL DAILY Patient taking differently: Place 1 spray into both nostrils daily. 09/04/18   [name], MD  gabapentin (NEURONTIN) 300 MG capsule Take 2 capsules (600 mg total) by mouth at bedtime. 07/02/21   [name], MD  glucose blood (BAYER CONTOUR NEXT TEST) test strip Use as instructed 05/17/16   Tresa Garter, MD  hydrocortisone (ANUSOL-HC) 25 MG suppository Place 1 suppository (25 mg total) rectally 2 (two) times daily. Patient not taking: Reported on 07/07/2021 02/16/20   Recardo Evangelist, PA-C  hydroxypropyl methylcellulose / hypromellose (ISOPTO TEARS / GONIOVISC) 2.5 % ophthalmic solution Place 1 drop into both eyes 3 (three) times daily as needed for dry eyes.    [provider]  Insulin Pen Needle (PEN NEEDLES) 31G X 6 MM MISC Use lantus every night at hours of sleep 03/15/14   Chari Manning A, NP  lidocaine (XYLOCAINE) 2 % solution Use as directed 15 mLs in the mouth or throat as needed for mouth pain. Swish and spit 27ms as needed for dental pain 01/12/18   Street, Mercedes, PA-C  MITIGARE 0.6 MG CAPS TAKE 2 CAPSULES BY MOUTH AT THE START OF GOUT ATTACK, MAY TAKE 1 CAPSULE AFTERWARDS IF SYMPTOMS PERSIST  Patient taking differently: Take 0.6 mg by mouth daily. 02/19/19   [name], MD  naltrexone (DEPADE) 50 MG tablet Take 1 tablet (50 mg total) by mouth daily. 11/18/21   [name], MD  Omega-3 Fatty Acids (FISH OIL) 1200 MG CAPS Take 1,200 mg by mouth daily.     [provider]  omeprazole (PRILOSEC) 20 MG capsule TAKE 1 CAPSULE(20 MG) BY MOUTH DAILY 07/02/21   [name], MD  OVER THE COUNTER MEDICATION Take 250 mg by mouth daily. Super Beta Prostate    [provider]  potassium chloride SA (KLOR-CON M) 20 MEQ  tablet 1 tablet with food    [provider]  sildenafil (VIAGRA) 50 MG tablet Take 1 tablet (50 mg total) by mouth daily as needed for erectile dysfunction. At least 24 hours between doses 04/26/18   [name], MD  thiamine (VITAMIN B-1) 100 MG tablet Take 100 mg by mouth daily.    [provider]  tiZANidine (ZANAFLEX) 4 MG tablet Take 1 tablet (4 mg total) by mouth every 8 (eight) hours as needed for muscle spasms. 07/02/21   [name], MD  triamcinolone cream (KENALOG) 0.1 % Apply 1 application topically 2 (two) times daily. 04/26/18   [name], MD      Allergies    Ace inhibitors and Furosemide    Review of Systems   Review of Systems  Physical Exam Updated Vital Signs BP (!) 152/108 (BP Location: Left Arm)   Pulse 82   Temp 98.5 F (36.9 C) (Oral)   Resp 18   SpO2 100%  Physical Exam Musculoskeletal:     Comments: Patient with full flexion and extension of left elbow.  No difficulty with supination or pronation.  Mild swelling noted to left elbow.  Area of erythema noted mid left forearm distal to left elbow.  No drainage noted.  Increased warmth noted to the area.     ED Results / Procedures / Treatments   Labs (all labs ordered are listed, but only abnormal results are displayed) Labs Reviewed - No data to display  EKG None  Radiology DG Forearm Left  Result Date: 12/21/2021 CLINICAL DATA:  Insect bite anterior surface of midforearm with redness and swelling. EXAM: LEFT FOREARM - 2 VIEW COMPARISON:  06/19/2013 FINDINGS: There is no evidence of fracture or dislocation. Degenerative changes are present at the elbow. A large olecranon spur is noted. Soft tissue swelling is present along the ventral aspect of the proximal forearm. IMPRESSION: No acute osseous abnormality. Electronically Signed   By: Brett Fairy M.D.   On: 12/21/2021 21:05   DG ELBOW COMPLETE LEFT (3+VIEW)  Result Date: 12/21/2021 CLINICAL DATA:  Red and swollen arm.   Insect bite EXAM: LEFT ELBOW - COMPLETE 3+ VIEW COMPARISON:  None Available. FINDINGS: No evidence of fracture of the ulna or humerus. The radial head is normal. No joint effusion. No soft tissue abnormality.  No subcutaneous gas. IMPRESSION: 1.  No acute osseous abnormality. 2. No soft tissue abnormality by radiograph. Electronically Signed   By: Suzy Bouchard M.D.   On: 12/21/2021 21:02    Procedures Procedures  {Document cardiac monitor, telemetry assessment procedure when appropriate:1}  Medications Ordered in ED Medications - No data to display  ED Course/ Medical Decision Making/ A&P                           Medical Decision Making Amount and/or Complexity of  Data Reviewed Radiology: ordered.   ***  {Document critical care time when appropriate:1} {Document review of labs and clinical decision tools ie heart score, Chads2Vasc2 etc:1}  {Document your independent review of radiology images, and any outside records:1} {Document your discussion with family members, caretakers, and with consultants:1} {Document social determinants of health affecting pt's care:1} {Document your decision making why or why not admission, treatments were needed:1} Final Clinical Impression(s) / ED Diagnoses Final diagnoses:  None    Rx / DC Orders ED Discharge Orders     None

## 2021-12-21 NOTE — ED Notes (Signed)
Pt transported to XR.  

## 2021-12-21 NOTE — ED Notes (Signed)
Pt ambulatory without assistance.  

## 2021-12-21 NOTE — Discharge Instructions (Signed)
It was a pleasure taking care of you today!   Your excising the emergency department were negative for acute fracture or dislocation.  You will be sent a prescription for Keflex, take as directed.  Call your primary care provider regarding today's ED visit.  Return to the emergency department if you are experiencing increasing/worsening fever, redness spreading outside of the marked areas, inability to move arm, worsening symptoms.

## 2021-12-21 NOTE — ED Triage Notes (Signed)
Pt arrived via POV, c/o insect bite to bilateral forearms with itching.

## 2021-12-31 ENCOUNTER — Ambulatory Visit: Payer: Medicare HMO | Attending: Family Medicine | Admitting: Family Medicine

## 2021-12-31 ENCOUNTER — Encounter: Payer: Self-pay | Admitting: Family Medicine

## 2021-12-31 VITALS — BP 135/87 | HR 82 | Temp 98.6°F | Resp 16 | Ht 74.0 in | Wt 185.0 lb

## 2021-12-31 DIAGNOSIS — E1149 Type 2 diabetes mellitus with other diabetic neurological complication: Secondary | ICD-10-CM | POA: Diagnosis not present

## 2021-12-31 DIAGNOSIS — R0981 Nasal congestion: Secondary | ICD-10-CM

## 2021-12-31 DIAGNOSIS — I700 Atherosclerosis of aorta: Secondary | ICD-10-CM | POA: Diagnosis not present

## 2021-12-31 DIAGNOSIS — E1169 Type 2 diabetes mellitus with other specified complication: Secondary | ICD-10-CM | POA: Diagnosis not present

## 2021-12-31 DIAGNOSIS — E1159 Type 2 diabetes mellitus with other circulatory complications: Secondary | ICD-10-CM | POA: Diagnosis not present

## 2021-12-31 DIAGNOSIS — R7989 Other specified abnormal findings of blood chemistry: Secondary | ICD-10-CM | POA: Diagnosis not present

## 2021-12-31 DIAGNOSIS — L409 Psoriasis, unspecified: Secondary | ICD-10-CM | POA: Diagnosis not present

## 2021-12-31 DIAGNOSIS — F1010 Alcohol abuse, uncomplicated: Secondary | ICD-10-CM | POA: Diagnosis not present

## 2021-12-31 DIAGNOSIS — M545 Low back pain, unspecified: Secondary | ICD-10-CM

## 2021-12-31 DIAGNOSIS — M1A9XX0 Chronic gout, unspecified, without tophus (tophi): Secondary | ICD-10-CM | POA: Diagnosis not present

## 2021-12-31 DIAGNOSIS — E785 Hyperlipidemia, unspecified: Secondary | ICD-10-CM

## 2021-12-31 DIAGNOSIS — I152 Hypertension secondary to endocrine disorders: Secondary | ICD-10-CM

## 2021-12-31 DIAGNOSIS — R040 Epistaxis: Secondary | ICD-10-CM | POA: Diagnosis not present

## 2021-12-31 MED ORDER — ATORVASTATIN CALCIUM 40 MG PO TABS: 40.0000 mg | ORAL_TABLET | Freq: Every day | ORAL | 1 refills | Status: DC

## 2021-12-31 MED ORDER — FLUTICASONE PROPIONATE 50 MCG/ACT NA SUSP: 1.0000 | Freq: Every day | NASAL | 1 refills | Status: DC

## 2021-12-31 MED ORDER — ALLOPURINOL 300 MG PO TABS: 300.0000 mg | ORAL_TABLET | Freq: Every day | ORAL | 1 refills | Status: DC

## 2021-12-31 MED ORDER — TRIAMCINOLONE ACETONIDE 0.1 % EX CREA: 1.0000 | TOPICAL_CREAM | Freq: Two times a day (BID) | CUTANEOUS | 1 refills | Status: DC

## 2021-12-31 MED ORDER — GABAPENTIN 300 MG PO CAPS: 600.0000 mg | ORAL_CAPSULE | Freq: Every day | ORAL | 6 refills | Status: DC

## 2021-12-31 MED ORDER — TIZANIDINE HCL 4 MG PO TABS: 4.0000 mg | ORAL_TABLET | Freq: Three times a day (TID) | ORAL | 1 refills | Status: DC | PRN

## 2021-12-31 MED ORDER — CETIRIZINE HCL 10 MG PO TABS: 10.0000 mg | ORAL_TABLET | Freq: Every day | ORAL | 1 refills | Status: DC

## 2021-12-31 MED ORDER — CARVEDILOL 3.125 MG PO TABS: ORAL_TABLET | ORAL | 1 refills | Status: DC

## 2021-12-31 MED ORDER — AMLODIPINE BESYLATE 10 MG PO TABS: ORAL_TABLET | ORAL | 1 refills | Status: DC

## 2021-12-31 NOTE — Patient Instructions (Signed)
Acute Back Pain, Adult Acute back pain is sudden and usually short-lived. It is often caused by an injury to the muscles and tissues in the back. The injury may result from: A muscle, tendon, or ligament getting overstretched or torn. Ligaments are tissues that connect bones to each other. Lifting something improperly can cause a back strain. Wear and tear (degeneration) of the spinal disks. Spinal disks are circular tissue that provide cushioning between the bones of the spine (vertebrae). Twisting motions, such as while playing sports or doing yard work. A hit to the back. Arthritis. You may have a physical exam, lab tests, and imaging tests to find the cause of your pain. Acute back pain usually goes away with rest and home care. Follow these instructions at home: Managing pain, stiffness, and swelling Take over-the-counter and prescription medicines only as told by your health care provider. Treatment may include medicines for pain and inflammation that are taken by mouth or applied to the skin, or muscle relaxants. Your health care provider may recommend applying ice during the first 24-48 hours after your pain starts. To do this: Put ice in a plastic bag. Place a towel between your skin and the bag. Leave the ice on for 20 minutes, 2-3 times a day. Remove the ice if your skin turns bright red. This is very important. If you cannot feel pain, heat, or cold, you have a greater risk of damage to the area. If directed, apply heat to the affected area as often as told by your health care provider. Use the heat source that your health care provider recommends, such as a moist heat pack or a heating pad. Place a towel between your skin and the heat source. Leave the heat on for 20-30 minutes. Remove the heat if your skin turns bright red. This is especially important if you are unable to feel pain, heat, or cold. You have a greater risk of getting burned. Activity  Do not stay in bed. Staying in  bed for more than 1-2 days can delay your recovery. Sit up and stand up straight. Avoid leaning forward when you sit or hunching over when you stand. If you work at a desk, sit close to it so you do not need to lean over. Keep your chin tucked in. Keep your neck drawn back, and keep your elbows bent at a 90-degree angle (right angle). Sit high and close to the steering wheel when you drive. Add lower back (lumbar) support to your car seat, if needed. Take short walks on even surfaces as soon as you are able. Try to increase the length of time you walk each day. Do not sit, drive, or stand in one place for more than 30 minutes at a time. Sitting or standing for long periods of time can put stress on your back. Do not drive or use heavy machinery while taking prescription pain medicine. Use proper lifting techniques. When you bend and lift, use positions that put less stress on your back: Bend your knees. Keep the load close to your body. Avoid twisting. Exercise regularly as told by your health care provider. Exercising helps your back heal faster and helps prevent back injuries by keeping muscles strong and flexible. Work with a physical therapist to make a safe exercise program, as recommended by your health care provider. Do any exercises as told by your physical therapist. Lifestyle Maintain a healthy weight. Extra weight puts stress on your back and makes it difficult to have good   posture. Avoid activities or situations that make you feel anxious or stressed. Stress and anxiety increase muscle tension and can make back pain worse. Learn ways to manage anxiety and stress, such as through exercise. General instructions Sleep on a firm mattress in a comfortable position. Try lying on your side with your knees slightly bent. If you lie on your back, put a pillow under your knees. Keep your head and neck in a straight line with your spine (neutral position) when using electronic equipment like  smartphones or pads. To do this: Raise your smartphone or pad to look at it instead of bending your head or neck to look down. Put the smartphone or pad at the level of your face while looking at the screen. Follow your treatment plan as told by your health care provider. This may include: Cognitive or behavioral therapy. Acupuncture or massage therapy. Meditation or yoga. Contact a health care provider if: You have pain that is not relieved with rest or medicine. You have increasing pain going down into your legs or buttocks. Your pain does not improve after 2 weeks. You have pain at night. You lose weight without trying. You have a fever or chills. You develop nausea or vomiting. You develop abdominal pain. Get help right away if: You develop new bowel or bladder control problems. You have unusual weakness or numbness in your arms or legs. You feel faint. These symptoms may represent a serious problem that is an emergency. Do not wait to see if the symptoms will go away. Get medical help right away. Call your local emergency services (911 in the [place] Do not drive yourself to the hospital. Summary Acute back pain is sudden and usually short-lived. Use proper lifting techniques. When you bend and lift, use positions that put less stress on your back. Take over-the-counter and prescription medicines only as told by your health care provider, and apply heat or ice as told. This information is not intended to replace advice given to you by your health care provider. Make sure you discuss any questions you have with your health care provider. Document Revised: 09/12/2020 Document Reviewed: 09/12/2020 [employer] Patient Education  2023 [employer]  
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
## 2021-12-31 NOTE — Progress Notes (Signed)
F/u HTN  Concerns with a nose bleed on Monday x 30 mins.  Pulled muscle in lower back on Tuesday   [facility] has not reached out to him

## 2022-01-01 ENCOUNTER — Other Ambulatory Visit: Payer: Self-pay | Admitting: Family Medicine

## 2022-01-01 DIAGNOSIS — R7989 Other specified abnormal findings of blood chemistry: Secondary | ICD-10-CM

## 2022-01-01 LAB — MICROALBUMIN / CREATININE URINE RATIO
Creatinine, Urine: 173.5 mg/dL
Microalb/Creat Ratio: 138 mg/g creat — ABNORMAL HIGH (ref 0–29)
Microalbumin, Urine: 239.7 ug/mL

## 2022-01-01 LAB — PT AND PTT
INR: 1 (ref 0.9–1.2)
Prothrombin Time: 11 s (ref 9.1–12.0)
aPTT: 28 s (ref 24–33)

## 2022-01-01 LAB — CBC WITH DIFFERENTIAL/PLATELET
Basophils Absolute: 0 10*3/uL (ref 0.0–0.2)
Basos: 1 %
EOS (ABSOLUTE): 0.1 10*3/uL (ref 0.0–0.4)
Eos: 4 %
Hematocrit: 37.3 % — ABNORMAL LOW (ref 37.5–51.0)
Hemoglobin: 12.2 g/dL — ABNORMAL LOW (ref 13.0–17.7)
Immature Grans (Abs): 0 10*3/uL (ref 0.0–0.1)
Immature Granulocytes: 0 %
Lymphocytes Absolute: 1.3 10*3/uL (ref 0.7–3.1)
Lymphs: 38 %
MCH: 28.4 pg (ref 26.6–33.0)
MCHC: 32.7 g/dL (ref 31.5–35.7)
MCV: 87 fL (ref 79–97)
Monocytes Absolute: 0.4 10*3/uL (ref 0.1–0.9)
Monocytes: 10 %
Neutrophils Absolute: 1.6 10*3/uL (ref 1.4–7.0)
Neutrophils: 47 %
Platelets: 110 10*3/uL — ABNORMAL LOW (ref 150–450)
RBC: 4.29 x10E6/uL (ref 4.14–5.80)
RDW: 19.4 % — ABNORMAL HIGH (ref 11.6–15.4)
WBC: 3.5 10*3/uL (ref 3.4–10.8)

## 2022-01-01 LAB — CMP14+EGFR
ALT: 19 IU/L (ref 0–44)
AST: 43 IU/L — ABNORMAL HIGH (ref 0–40)
Albumin/Globulin Ratio: 0.9 — ABNORMAL LOW (ref 1.2–2.2)
Albumin: 4 g/dL (ref 3.8–4.8)
Alkaline Phosphatase: 190 IU/L — ABNORMAL HIGH (ref 44–121)
BUN/Creatinine Ratio: 14 (ref 10–24)
BUN: 23 mg/dL (ref 8–27)
Bilirubin Total: 0.7 mg/dL (ref 0.0–1.2)
CO2: 24 mmol/L (ref 20–29)
Calcium: 9.3 mg/dL (ref 8.6–10.2)
Chloride: 97 mmol/L (ref 96–106)
Creatinine, Ser: 1.65 mg/dL — ABNORMAL HIGH (ref 0.76–1.27)
Globulin, Total: 4.5 g/dL (ref 1.5–4.5)
Glucose: 90 mg/dL (ref 70–99)
Potassium: 4.2 mmol/L (ref 3.5–5.2)
Sodium: 135 mmol/L (ref 134–144)
Total Protein: 8.5 g/dL (ref 6.0–8.5)
eGFR: 45 mL/min/{1.73_m2} — ABNORMAL LOW (ref >59)

## 2022-01-01 LAB — LP+NON-HDL CHOLESTEROL
Cholesterol, Total: 128 mg/dL (ref 100–199)
HDL: 62 mg/dL (ref >39)
LDL Chol Calc (NIH): 43 mg/dL (ref 0–99)
Total Non-HDL-Chol (LDL+VLDL): 66 mg/dL (ref 0–129)
Triglycerides: 131 mg/dL (ref 0–149)
VLDL Cholesterol Cal: 23 mg/dL (ref 5–40)

## 2022-01-01 LAB — URIC ACID: Uric Acid: 7.4 mg/dL (ref 3.8–8.4)

## 2022-01-02 LAB — SPECIMEN STATUS REPORT

## 2022-01-02 LAB — GAMMA GT: GGT: 309 IU/L — ABNORMAL HIGH (ref 0–65)

## 2022-01-04 LAB — POCT GLYCOSYLATED HEMOGLOBIN (HGB A1C): Hemoglobin A1C: 4.5 % (ref 4.0–5.6)

## 2022-01-04 NOTE — Addendum Note (Signed)
Addended by: Carilyn Goodpasture on: 01/04/2022 10:03 AM   Modules accepted: Orders

## 2022-01-15 ENCOUNTER — Other Ambulatory Visit: Payer: Self-pay

## 2022-01-15 NOTE — Patient Outreach (Signed)
[place] [facility]) Care Management  01/15/2022  Cesare Sumlin 04-10-1956 [id number]   Patient doing well. Case closure discussed. Patient agreeable.    Care Plan : Hypertension (Adult)  Updates made by Jon Billings, RN since 01/15/2022 12:00 AM  Completed 01/15/2022   Problem: Hypertension (Hypertension) Resolved 01/15/2022     Care Plan : RN Care Manager Plan of Care  Updates made by Jon Billings, RN since 01/15/2022 12:00 AM  Completed 01/15/2022   Problem: Chronic Disease Management and Care Coordination of HTN Resolved 01/15/2022  Priority: High     Long-Range Goal: Development of Plan of Care for management of HTN Completed 01/15/2022  Start Date: 06/17/2021  Expected End Date: 07/04/2022  This Visit's Progress: On track  Priority: High  Note:   Current Barriers:  Knowledge Deficits related to plan of care for management of HTN  Chronic Disease Management support and education needs related to HTN   RNCM Clinical Goal(s):  Patient will verbalize basic understanding of  HTN disease process and self health management plan as evidenced by Patient reporting blood pressure less than 140/80 continue to work with [place] to address care management and care coordination needs related to  HTN as evidenced by adherence to CM Team Scheduled appointments through collaboration with RN Care manager, provider, and care team.   Interventions: Education and support related to HTN management Inter-disciplinary care team collaboration (see longitudinal plan of care) Evaluation of current treatment plan related to  self management and patient's adherence to plan as established by provider   Hypertension Interventions:  (Status:  Goal Met.) Long Term Goal Last practice recorded BP readings:  BP Readings from Last 3 Encounters:  01/26/21 (!) 145/104  12/25/20 136/87  12/05/20 (!) 152/86  Most recent eGFR/CrCl:  Lab Results  Component Value Date   EGFR >60  07/07/2017    No components found for: CRCL  Evaluation of current treatment plan related to hypertension self management and patient's adherence to plan as established by provider Discussed plans with patient for ongoing care management follow up and provided patient with direct contact information for care management team Discussed diet and importance of limiting salt intake.   Patient Goals/Self-Care Activities: Take all medications as prescribed check blood pressure weekly write blood pressure results in a log or diary take blood pressure log to all doctor appointments eat more whole grains, fruits and vegetables, lean meats and healthy fats Limit salt intake  Follow Up Plan:  case close    Plan: closing case.  [name], RN, MSN [facility] Care Management Care Management Coordinator Direct Line [phone] Toll Free: [phone]  Fax: [phone]

## 2022-01-18 DIAGNOSIS — H53411 Scotoma involving central area, right eye: Secondary | ICD-10-CM | POA: Diagnosis not present

## 2022-01-18 DIAGNOSIS — H25812 Combined forms of age-related cataract, left eye: Secondary | ICD-10-CM | POA: Diagnosis not present

## 2022-01-18 DIAGNOSIS — H1045 Other chronic allergic conjunctivitis: Secondary | ICD-10-CM | POA: Diagnosis not present

## 2022-01-18 DIAGNOSIS — H43391 Other vitreous opacities, right eye: Secondary | ICD-10-CM | POA: Diagnosis not present

## 2022-01-18 DIAGNOSIS — H31091 Other chorioretinal scars, right eye: Secondary | ICD-10-CM | POA: Diagnosis not present

## 2022-01-20 DIAGNOSIS — H2512 Age-related nuclear cataract, left eye: Secondary | ICD-10-CM | POA: Diagnosis not present

## 2022-01-22 DIAGNOSIS — H25812 Combined forms of age-related cataract, left eye: Secondary | ICD-10-CM | POA: Diagnosis not present

## 2022-02-23 ENCOUNTER — Ambulatory Visit: Payer: Medicare HMO | Attending: Family Medicine

## 2022-02-23 DIAGNOSIS — Z0000 Encounter for general adult medical examination without abnormal findings: Secondary | ICD-10-CM | POA: Diagnosis not present
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
## 2022-02-23 NOTE — Patient Instructions (Signed)
[name] , Thank you for taking time to come for your Medicare Wellness Visit. I appreciate your ongoing commitment to your health goals. Please review the following plan we discussed and let me know if I can assist you in the future.   These are the goals we discussed:  Goals      Have 3 meals a day     Track and Manage My Blood Pressure-Hypertension     Barriers: Health Behaviors Knowledge Timeframe:  Long-Range Goal Priority:  High Start Date: 12/10/20                             Expected End Date:    01/01/22               Follow Up Date 05/04/21  - check blood pressure weekly - write blood pressure results in a log or diary    Why is this important?   You won't feel high blood pressure, but it can still hurt your blood vessels.  High blood pressure can cause heart or kidney problems. It can also cause a stroke.  Making lifestyle changes like losing a little weight or eating less salt will help.  Checking your blood pressure at home and at different times of the day can help to control blood pressure.  If the doctor prescribes medicine remember to take it the way the doctor ordered.  Call the office if you cannot afford the medicine or if there are questions about it.     Notes: 12/10/20 Patient does not have blood pressure cuff to monitor pressure.  Ordered blood pressure cuff.  Discussed hypertension management: Take medications as ordered Limit salt intake. Eat plenty of fruits and vegetables Remain as active as possible Take blood pressure at least weekly at home and record in a notebook to take to doctors visits  01/15/21 Patient reports he has his blood pressure cuff and has been checking his blood pressure.  He does not have his readings with but states it is less than 140/80.  Patient reports he is eating a low salt diet.  He has all his medications.   Hypertension Management Reiterated Take medications as ordered Limit salt intake. Eat plenty of fruits and  vegetables Remain as active as possible Take blood pressure at least weekly at home and record in a notebook to take to doctor's visits.  02/18/21 Patient reports doing good.  Does not have any blood pressure readings he can share but agrees that his blood pressure is less than 140/80.  Discussed hypertension management. No concerns.   03/25/21 Patient doing good.  Working again.  He reports his last blood pressure reading was good. He does not remember the numbers. Encouraged to check blood pressure at least weekly.  He is agreeable.  Reiterated hypertension management and low salt diet. 06/17/21 Patient reports blood pressure doing good. No new readings reported by patient.   Resolving duplicate goal        This is a list of the screening recommended for you and due dates:  Health Maintenance  Topic Date Due   COVID-19 Vaccine (1) Never done   Tetanus Vaccine  Never done   Zoster (Shingles) Vaccine (1 of 2) Never done   Eye exam for diabetics  12/05/2021   Flu Shot  02/02/2022   Hemoglobin A1C  07/07/2022   Complete foot exam   01/01/2023   Urine Protein Check  01/01/2023  Colon Cancer Screening  04/22/2029   Pneumonia Vaccine  Completed   Hepatitis C Screening: USPSTF Recommendation to screen - Ages 17-79 yo.  Completed   HPV Vaccine  Aged Out   Health Maintenance After Age 72 After age 62, you are at a higher risk for certain long-term diseases and infections as well as injuries from falls. Falls are a major cause of broken bones and head injuries in people who are older than age 78. Getting regular preventive care can help to keep you healthy and well. Preventive care includes getting regular testing and making lifestyle changes as recommended by your health care provider. Talk with your health care provider about: Which screenings and tests you should have. A screening is a test that checks for a disease when you have no symptoms. A diet and exercise plan that is right for  you. What should I know about screenings and tests to prevent falls? Screening and testing are the best ways to find a health problem early. Early diagnosis and treatment give you the best chance of managing medical conditions that are common after age 92. Certain conditions and lifestyle choices may make you more likely to have a fall. Your health care provider may recommend: Regular vision checks. Poor vision and conditions such as cataracts can make you more likely to have a fall. If you wear glasses, make sure to get your prescription updated if your vision changes. Medicine review. Work with your health care provider to regularly review all of the medicines you are taking, including over-the-counter medicines. Ask your health care provider about any side effects that may make you more likely to have a fall. Tell your health care provider if any medicines that you take make you feel dizzy or sleepy. Strength and balance checks. Your health care provider may recommend certain tests to check your strength and balance while standing, walking, or changing positions. Foot health exam. Foot pain and numbness, as well as not wearing proper footwear, can make you more likely to have a fall. Screenings, including: Osteoporosis screening. Osteoporosis is a condition that causes the bones to get weaker and break more easily. Blood pressure screening. Blood pressure changes and medicines to control blood pressure can make you feel dizzy. Depression screening. You may be more likely to have a fall if you have a fear of falling, feel depressed, or feel unable to do activities that you used to do. Alcohol use screening. Using too much alcohol can affect your balance and may make you more likely to have a fall. Follow these instructions at home: Lifestyle Do not drink alcohol if: Your health care provider tells you not to drink. If you drink alcohol: Limit how much you have to: 0-1 drink a day for women. 0-2  drinks a day for men. Know how much alcohol is in your drink. In the [place] one drink equals one 12 oz bottle of beer (355 mL), one 5 oz glass of wine (148 mL), or one 1 oz glass of hard liquor (44 mL). Do not use any products that contain nicotine or tobacco. These products include cigarettes, chewing tobacco, and vaping devices, such as e-cigarettes. If you need help quitting, ask your health care provider. Activity  Follow a regular exercise program to stay fit. This will help you maintain your balance. Ask your health care provider what types of exercise are appropriate for you. If you need a cane or walker, use it as recommended by your health care provider. Wear  supportive shoes that have nonskid soles. Safety  Remove any tripping hazards, such as rugs, cords, and clutter. Install safety equipment such as grab bars in bathrooms and safety rails on stairs. Keep rooms and walkways well-lit. General instructions Talk with your health care provider about your risks for falling. Tell your health care provider if: You fall. Be sure to tell your health care provider about all falls, even ones that seem minor. You feel dizzy, tiredness (fatigue), or off-balance. Take over-the-counter and prescription medicines only as told by your health care provider. These include supplements. Eat a healthy diet and maintain a healthy weight. A healthy diet includes low-fat dairy products, low-fat (lean) meats, and fiber from whole grains, beans, and lots of fruits and vegetables. Stay current with your vaccines. Schedule regular health, dental, and eye exams. Summary Having a healthy lifestyle and getting preventive care can help to protect your health and wellness after age 97. Screening and testing are the best way to find a health problem early and help you avoid having a fall. Early diagnosis and treatment give you the best chance for managing medical conditions that are more common for people who are  older than age 73. Falls are a major cause of broken bones and head injuries in people who are older than age 84. Take precautions to prevent a fall at home. Work with your health care provider to learn what changes you can make to improve your health and wellness and to prevent falls. This information is not intended to replace advice given to you by your health care provider. Make sure you discuss any questions you have with your health care provider. Document Revised: 11/10/2020 Document Reviewed: 11/10/2020 Elsevier Patient Education  [place].

## 2022-03-05 ENCOUNTER — Ambulatory Visit: Payer: Self-pay

## 2022-03-29 DIAGNOSIS — Z0100 Encounter for examination of eyes and vision without abnormal findings: Secondary | ICD-10-CM | POA: Diagnosis not present

## 2022-04-02 ENCOUNTER — Other Ambulatory Visit: Payer: Medicare HMO

## 2022-04-05 ENCOUNTER — Other Ambulatory Visit: Payer: Medicare HMO

## 2022-04-05 ENCOUNTER — Ambulatory Visit (HOSPITAL_COMMUNITY)
Admission: RE | Admit: 2022-04-05 | Discharge: 2022-04-05 | Disposition: A | Discharge status: Home / Self Care | Payer: Medicare HMO | Source: Ambulatory Visit | Attending: Internal Medicine | Admitting: Internal Medicine

## 2022-04-05 ENCOUNTER — Other Ambulatory Visit: Payer: Self-pay

## 2022-04-05 ENCOUNTER — Inpatient Hospital Stay: Payer: Medicare HMO | Attending: Internal Medicine

## 2022-04-05 DIAGNOSIS — E876 Hypokalemia: Secondary | ICD-10-CM | POA: Insufficient documentation

## 2022-04-05 DIAGNOSIS — C3490 Malignant neoplasm of unspecified part of unspecified bronchus or lung: Secondary | ICD-10-CM

## 2022-04-05 DIAGNOSIS — C3431 Malignant neoplasm of lower lobe, right bronchus or lung: Secondary | ICD-10-CM | POA: Insufficient documentation

## 2022-04-05 DIAGNOSIS — J439 Emphysema, unspecified: Secondary | ICD-10-CM | POA: Diagnosis not present

## 2022-04-05 DIAGNOSIS — I10 Essential (primary) hypertension: Secondary | ICD-10-CM | POA: Insufficient documentation

## 2022-04-05 DIAGNOSIS — Z79899 Other long term (current) drug therapy: Secondary | ICD-10-CM | POA: Insufficient documentation

## 2022-04-05 LAB — CMP (CANCER CENTER ONLY)
ALT: 13 U/L (ref 0–44)
AST: 27 U/L (ref 15–41)
Albumin: 3.8 g/dL (ref 3.5–5.0)
Alkaline Phosphatase: 138 U/L — ABNORMAL HIGH (ref 38–126)
Anion gap: 9 (ref 5–15)
BUN: 21 mg/dL (ref 8–23)
CO2: 26 mmol/L (ref 22–32)
Calcium: 9 mg/dL (ref 8.9–10.3)
Chloride: 102 mmol/L (ref 98–111)
Creatinine: 1.49 mg/dL — ABNORMAL HIGH (ref 0.61–1.24)
GFR, Estimated: 51 mL/min — ABNORMAL LOW (ref >60)
Glucose, Bld: 100 mg/dL — ABNORMAL HIGH (ref 70–99)
Potassium: 3.1 mmol/L — ABNORMAL LOW (ref 3.5–5.1)
Sodium: 137 mmol/L (ref 135–145)
Total Bilirubin: 0.9 mg/dL (ref 0.3–1.2)
Total Protein: 7.7 g/dL (ref 6.5–8.1)

## 2022-04-05 LAB — CBC WITH DIFFERENTIAL (CANCER CENTER ONLY)
Abs Immature Granulocytes: 0.01 10*3/uL (ref 0.00–0.07)
Basophils Absolute: 0 10*3/uL (ref 0.0–0.1)
Basophils Relative: 1 %
Eosinophils Absolute: 0.1 10*3/uL (ref 0.0–0.5)
Eosinophils Relative: 3 %
HCT: 33.5 % — ABNORMAL LOW (ref 39.0–52.0)
Hemoglobin: 11.3 g/dL — ABNORMAL LOW (ref 13.0–17.0)
Immature Granulocytes: 0 %
Lymphocytes Relative: 26 %
Lymphs Abs: 0.7 10*3/uL (ref 0.7–4.0)
MCH: 28 pg (ref 26.0–34.0)
MCHC: 33.7 g/dL (ref 30.0–36.0)
MCV: 82.9 fL (ref 80.0–100.0)
Monocytes Absolute: 0.4 10*3/uL (ref 0.1–1.0)
Monocytes Relative: 15 %
Neutro Abs: 1.5 10*3/uL — ABNORMAL LOW (ref 1.7–7.7)
Neutrophils Relative %: 55 %
Platelet Count: 104 10*3/uL — ABNORMAL LOW (ref 150–400)
RBC: 4.04 MIL/uL — ABNORMAL LOW (ref 4.22–5.81)
RDW: 17.3 % — ABNORMAL HIGH (ref 11.5–15.5)
WBC Count: 2.8 10*3/uL — ABNORMAL LOW (ref 4.0–10.5)
nRBC: 0 % (ref 0.0–0.2)

## 2022-04-06 ENCOUNTER — Inpatient Hospital Stay: Payer: Medicare HMO | Admitting: Internal Medicine

## 2022-04-06 VITALS — BP 153/92 | HR 83 | Temp 98.0°F | Ht 73.0 in | Wt 189.0 lb

## 2022-04-06 DIAGNOSIS — I10 Essential (primary) hypertension: Secondary | ICD-10-CM | POA: Diagnosis not present

## 2022-04-06 DIAGNOSIS — C3490 Malignant neoplasm of unspecified part of unspecified bronchus or lung: Secondary | ICD-10-CM | POA: Diagnosis not present

## 2022-04-06 DIAGNOSIS — E876 Hypokalemia: Secondary | ICD-10-CM | POA: Diagnosis not present

## 2022-04-06 DIAGNOSIS — Z79899 Other long term (current) drug therapy: Secondary | ICD-10-CM | POA: Diagnosis not present

## 2022-04-06 DIAGNOSIS — C3431 Malignant neoplasm of lower lobe, right bronchus or lung: Secondary | ICD-10-CM | POA: Diagnosis not present
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
## 2022-04-11 ENCOUNTER — Other Ambulatory Visit: Payer: Self-pay | Admitting: Family Medicine

## 2022-04-11 DIAGNOSIS — K219 Gastro-esophageal reflux disease without esophagitis: Secondary | ICD-10-CM

## 2022-04-26 DIAGNOSIS — H31011 Macula scars of posterior pole (postinflammatory) (post-traumatic), right eye: Secondary | ICD-10-CM | POA: Diagnosis not present

## 2022-04-26 DIAGNOSIS — H53411 Scotoma involving central area, right eye: Secondary | ICD-10-CM | POA: Diagnosis not present

## 2022-04-26 DIAGNOSIS — Z961 Presence of intraocular lens: Secondary | ICD-10-CM | POA: Diagnosis not present

## 2022-04-26 DIAGNOSIS — H338 Other retinal detachments: Secondary | ICD-10-CM | POA: Diagnosis not present

## 2022-04-26 DIAGNOSIS — H59811 Chorioretinal scars after surgery for detachment, right eye: Secondary | ICD-10-CM | POA: Diagnosis not present

## 2022-04-26 DIAGNOSIS — H2701 Aphakia, right eye: Secondary | ICD-10-CM | POA: Diagnosis not present

## 2022-06-10 DIAGNOSIS — Z961 Presence of intraocular lens: Secondary | ICD-10-CM | POA: Diagnosis not present

## 2022-06-10 DIAGNOSIS — H353121 Nonexudative age-related macular degeneration, left eye, early dry stage: Secondary | ICD-10-CM | POA: Diagnosis not present

## 2022-06-10 DIAGNOSIS — H338 Other retinal detachments: Secondary | ICD-10-CM | POA: Diagnosis not present

## 2022-06-10 DIAGNOSIS — H35362 Drusen (degenerative) of macula, left eye: Secondary | ICD-10-CM | POA: Diagnosis not present

## 2022-06-10 DIAGNOSIS — H31091 Other chorioretinal scars, right eye: Secondary | ICD-10-CM | POA: Diagnosis not present

## 2022-06-10 DIAGNOSIS — H59811 Chorioretinal scars after surgery for detachment, right eye: Secondary | ICD-10-CM | POA: Diagnosis not present

## 2022-07-01 ENCOUNTER — Ambulatory Visit: Payer: Medicare HMO | Attending: Family Medicine | Admitting: Family Medicine

## 2022-07-01 ENCOUNTER — Encounter: Payer: Self-pay | Admitting: Family Medicine

## 2022-07-01 VITALS — BP 136/85 | HR 87 | Temp 98.0°F | Ht 74.0 in | Wt 183.8 lb

## 2022-07-01 DIAGNOSIS — R42 Dizziness and giddiness: Secondary | ICD-10-CM

## 2022-07-01 DIAGNOSIS — K219 Gastro-esophageal reflux disease without esophagitis: Secondary | ICD-10-CM | POA: Diagnosis not present

## 2022-07-01 DIAGNOSIS — M1A9XX0 Chronic gout, unspecified, without tophus (tophi): Secondary | ICD-10-CM

## 2022-07-01 DIAGNOSIS — E1159 Type 2 diabetes mellitus with other circulatory complications: Secondary | ICD-10-CM

## 2022-07-01 DIAGNOSIS — E1169 Type 2 diabetes mellitus with other specified complication: Secondary | ICD-10-CM | POA: Diagnosis not present

## 2022-07-01 DIAGNOSIS — I152 Hypertension secondary to endocrine disorders: Secondary | ICD-10-CM

## 2022-07-01 DIAGNOSIS — Z23 Encounter for immunization: Secondary | ICD-10-CM | POA: Diagnosis not present

## 2022-07-01 DIAGNOSIS — E785 Hyperlipidemia, unspecified: Secondary | ICD-10-CM | POA: Diagnosis not present

## 2022-07-01 DIAGNOSIS — J439 Emphysema, unspecified: Secondary | ICD-10-CM | POA: Diagnosis not present

## 2022-07-01 MED ORDER — AMLODIPINE BESYLATE 5 MG PO TABS: ORAL_TABLET | ORAL | 1 refills | Status: DC

## 2022-07-01 NOTE — Patient Instructions (Signed)
Hypotension As the heart beats, it forces blood through the body. Hypotension, commonly called low blood pressure, is when the force of blood pumping through the arteries is too weak. Arteries are blood vessels that carry blood from the heart throughout the body. Depending on the cause and severity, hypotension may be harmless (benign) or may cause serious problems (be critical). When your blood pressure is too low, you may not get enough blood to your brain or to the rest of your organs. This can cause weakness, light-headedness, a rapid heartbeat, and fainting. What are the causes? This condition may be caused by: Blood loss. Loss of body fluids (dehydration). Heart problems. Hormone (endocrine) problems. Pregnancy. Severe infection. Lack of certain nutrients. Severe allergic reactions (anaphylaxis). Certain medicines, such as blood pressure medicine or medicines that make the body lose excess fluids (diuretics). Sometimes, hypotension may be caused by not taking medicine as directed, such as taking too much of a certain medicine. What increases the risk? The following factors may make you more likely to develop this condition: Age. Risk increases as you get older. Having a condition that affects the heart or the central nervous system. What are the signs or symptoms? Common symptoms of this condition include: Weakness. Light-headedness. Dizziness. Blurred vision. Tiredness (fatigue). Rapid heartbeat. Fainting, in severe cases. How is this diagnosed? This condition is diagnosed based on: Your medical history. Your symptoms. Your blood pressure measurement. Your health care provider will check your blood pressure when you are: Lying down. Sitting. Standing. A blood pressure reading is recorded as two numbers, such as "120 over 80" (or 120/80). The first ("top") number is called the systolic pressure. It is a measure of the pressure in your arteries as your heart beats. The second  ("bottom") number is called the diastolic pressure. It is a measure of the pressure in your arteries when your heart relaxes between beats. Blood pressure is measured in a unit called mm Hg. Healthy blood pressure for most adults is 120/80. If your blood pressure is below 90/60, you may be diagnosed with hypotension. Other information or tests that may be used to diagnose hypotension include: Your other vital signs, such as your heart rate and temperature. Blood tests. Tilt table test. For this test, you will be safely secured to a table that moves you from a lying position to an upright position. Your heart rhythm and blood pressure will be monitored during the test. How is this treated? Treatment for this condition may include: Changing your diet. This may involve drinking more water or increasing your salt (sodium) intake with high-sodium foods. Taking medicines to raise your blood pressure. Changing the dosage of certain medicines you are taking that might be lowering your blood pressure. Wearing compression stockings. These stockings help to prevent blood clots and reduce swelling in your legs. In some cases, you may need to go to the hospital for: Fluid replacement. This means you will receive fluids through an IV. Blood replacement. This means you will receive donated blood through an IV (transfusion). Treating an infection or heart problems, if this applies. Monitoring. You may need to be monitored while medicines that you are taking wear off. Follow these instructions at home: Eating and drinking  Drink enough fluid to keep your urine pale yellow. Eat a healthy diet, and follow instructions from your health care provider about eating or drinking restrictions. A healthy diet includes: Fresh fruits and vegetables. Whole grains. Lean meats. Low-fat dairy products. Increase your salt intake if told  to do so. Do not add extra salt to your diet unless your health care provider tells you  to do that. Eat frequent, small meals. Avoid standing up suddenly after eating. Medicines Take over-the-counter and prescription medicines only as told by your health care provider. Follow instructions from your health care provider about changing the dosage of your current medicines, if this applies. Do not stop or adjust any of your medicines on your own. General instructions  Wear compression stockings as told by your health care provider. Get up slowly from lying down or sitting positions. This gives your blood pressure a chance to adjust. Avoid hot showers and excessive heat as directed by your health care provider. Return to your normal activities as told by your health care provider. Ask your health care provider what activities are safe for you. Do not use any products that contain nicotine or tobacco. These products include cigarettes, chewing tobacco, and vaping devices, such as e-cigarettes. If you need help quitting, ask your health care provider. Keep all follow-up visits. This is important. Contact a health care provider if: You vomit. You have diarrhea. You have a fever for more than 2-3 days. You feel more thirsty than usual. You feel weak and tired. Get help right away if: You have chest pain. You have a fast or irregular heartbeat. You develop numbness in any part of your body. You cannot move your arms or your legs. You have trouble speaking. You become sweaty or feel light-headed. You faint. You feel short of breath. You have trouble staying awake. You feel confused. These symptoms may be an emergency. Get help right away. Call 911. Do not wait to see if the symptoms will go away. Do not drive yourself to the hospital. Summary Hypotension is when the force of blood pumping through the arteries is too weak. Hypotension may be harmless (benign) or may cause serious problems (be critical). Treatment for this condition may include changing your diet, changing  your medicines, and wearing compression stockings. In some cases, you may need to go to the hospital for fluid or blood replacement. This information is not intended to replace advice given to you by your health care provider. Make sure you discuss any questions you have with your health care provider. Document Revised: 02/09/2021 Document Reviewed: 02/09/2021 Elsevier Patient Education  [place].

## 2022-07-01 NOTE — Progress Notes (Signed)
States that one of his medication makes him feel out of it.  States that he has stopped most of his medications.
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
## 2022-07-02 LAB — BASIC METABOLIC PANEL
BUN/Creatinine Ratio: 12 (ref 10–24)
BUN: 16 mg/dL (ref 8–27)
CO2: 24 mmol/L (ref 20–29)
Calcium: 8.7 mg/dL (ref 8.6–10.2)
Chloride: 96 mmol/L (ref 96–106)
Creatinine, Ser: 1.3 mg/dL — ABNORMAL HIGH (ref 0.76–1.27)
Glucose: 132 mg/dL — ABNORMAL HIGH (ref 70–99)
Potassium: 3.6 mmol/L (ref 3.5–5.2)
Sodium: 135 mmol/L (ref 134–144)
eGFR: 60 mL/min/{1.73_m2} (ref >59)

## 2022-07-02 LAB — HEMOGLOBIN A1C
Est. average glucose Bld gHb Est-mCnc: 88 mg/dL
Hgb A1c MFr Bld: 4.7 % — ABNORMAL LOW (ref 4.8–5.6)

## 2022-09-08 DIAGNOSIS — H40023 Open angle with borderline findings, high risk, bilateral: Secondary | ICD-10-CM | POA: Diagnosis not present

## 2022-09-08 DIAGNOSIS — Z961 Presence of intraocular lens: Secondary | ICD-10-CM | POA: Diagnosis not present

## 2022-10-04 ENCOUNTER — Ambulatory Visit (HOSPITAL_COMMUNITY)
Admission: RE | Admit: 2022-10-04 | Discharge: 2022-10-04 | Disposition: A | Discharge status: Home / Self Care | Payer: Medicare HMO | Source: Ambulatory Visit | Attending: Internal Medicine | Admitting: Internal Medicine

## 2022-10-04 ENCOUNTER — Inpatient Hospital Stay: Payer: Medicare HMO | Attending: Internal Medicine

## 2022-10-04 ENCOUNTER — Other Ambulatory Visit: Payer: Self-pay

## 2022-10-04 DIAGNOSIS — Z85118 Personal history of other malignant neoplasm of bronchus and lung: Secondary | ICD-10-CM | POA: Insufficient documentation

## 2022-10-04 DIAGNOSIS — K7460 Unspecified cirrhosis of liver: Secondary | ICD-10-CM | POA: Insufficient documentation

## 2022-10-04 DIAGNOSIS — Z08 Encounter for follow-up examination after completed treatment for malignant neoplasm: Secondary | ICD-10-CM | POA: Diagnosis not present

## 2022-10-04 DIAGNOSIS — E876 Hypokalemia: Secondary | ICD-10-CM | POA: Insufficient documentation

## 2022-10-04 DIAGNOSIS — C3490 Malignant neoplasm of unspecified part of unspecified bronchus or lung: Secondary | ICD-10-CM | POA: Insufficient documentation

## 2022-10-04 DIAGNOSIS — J479 Bronchiectasis, uncomplicated: Secondary | ICD-10-CM | POA: Diagnosis not present

## 2022-10-04 DIAGNOSIS — J439 Emphysema, unspecified: Secondary | ICD-10-CM | POA: Diagnosis not present

## 2022-10-04 DIAGNOSIS — D61818 Other pancytopenia: Secondary | ICD-10-CM | POA: Insufficient documentation

## 2022-10-04 LAB — CBC WITH DIFFERENTIAL (CANCER CENTER ONLY)
Abs Immature Granulocytes: 0.01 10*3/uL (ref 0.00–0.07)
Basophils Absolute: 0 10*3/uL (ref 0.0–0.1)
Basophils Relative: 1 %
Eosinophils Absolute: 0.2 10*3/uL (ref 0.0–0.5)
Eosinophils Relative: 9 %
HCT: 32.5 % — ABNORMAL LOW (ref 39.0–52.0)
Hemoglobin: 11.5 g/dL — ABNORMAL LOW (ref 13.0–17.0)
Immature Granulocytes: 1 %
Lymphocytes Relative: 41 %
Lymphs Abs: 0.8 10*3/uL (ref 0.7–4.0)
MCH: 31.4 pg (ref 26.0–34.0)
MCHC: 35.4 g/dL (ref 30.0–36.0)
MCV: 88.8 fL (ref 80.0–100.0)
Monocytes Absolute: 0.2 10*3/uL (ref 0.1–1.0)
Monocytes Relative: 12 %
Neutro Abs: 0.7 10*3/uL — ABNORMAL LOW (ref 1.7–7.7)
Neutrophils Relative %: 36 %
Platelet Count: 84 10*3/uL — ABNORMAL LOW (ref 150–400)
RBC: 3.66 MIL/uL — ABNORMAL LOW (ref 4.22–5.81)
RDW: 20.6 % — ABNORMAL HIGH (ref 11.5–15.5)
WBC Count: 1.9 10*3/uL — ABNORMAL LOW (ref 4.0–10.5)
nRBC: 0 % (ref 0.0–0.2)

## 2022-10-04 LAB — CMP (CANCER CENTER ONLY)
ALT: 10 U/L (ref 0–44)
AST: 27 U/L (ref 15–41)
Albumin: 3.6 g/dL (ref 3.5–5.0)
Alkaline Phosphatase: 151 U/L — ABNORMAL HIGH (ref 38–126)
Anion gap: 9 (ref 5–15)
BUN: 22 mg/dL (ref 8–23)
CO2: 26 mmol/L (ref 22–32)
Calcium: 8.5 mg/dL — ABNORMAL LOW (ref 8.9–10.3)
Chloride: 103 mmol/L (ref 98–111)
Creatinine: 1.52 mg/dL — ABNORMAL HIGH (ref 0.61–1.24)
GFR, Estimated: 50 mL/min — ABNORMAL LOW (ref >60)
Glucose, Bld: 86 mg/dL (ref 70–99)
Potassium: 3.4 mmol/L — ABNORMAL LOW (ref 3.5–5.1)
Sodium: 138 mmol/L (ref 135–145)
Total Bilirubin: 0.5 mg/dL (ref 0.3–1.2)
Total Protein: 7.7 g/dL (ref 6.5–8.1)

## 2022-10-06 ENCOUNTER — Other Ambulatory Visit: Payer: Self-pay | Admitting: Medical Oncology

## 2022-10-06 ENCOUNTER — Inpatient Hospital Stay: Payer: Medicare HMO

## 2022-10-06 ENCOUNTER — Other Ambulatory Visit: Payer: Self-pay

## 2022-10-06 ENCOUNTER — Inpatient Hospital Stay: Payer: Medicare HMO | Admitting: Internal Medicine

## 2022-10-06 VITALS — BP 143/89 | HR 93 | Temp 98.2°F | Resp 14 | Ht 74.0 in | Wt 181.9 lb

## 2022-10-06 DIAGNOSIS — D61818 Other pancytopenia: Secondary | ICD-10-CM

## 2022-10-06 DIAGNOSIS — Z08 Encounter for follow-up examination after completed treatment for malignant neoplasm: Secondary | ICD-10-CM | POA: Diagnosis not present

## 2022-10-06 DIAGNOSIS — K7460 Unspecified cirrhosis of liver: Secondary | ICD-10-CM | POA: Diagnosis not present

## 2022-10-06 DIAGNOSIS — E876 Hypokalemia: Secondary | ICD-10-CM | POA: Diagnosis not present

## 2022-10-06 DIAGNOSIS — Z85118 Personal history of other malignant neoplasm of bronchus and lung: Secondary | ICD-10-CM | POA: Diagnosis not present

## 2022-10-06 LAB — VITAMIN B12: Vitamin B-12: 184 pg/mL (ref 180–914)

## 2022-10-06 LAB — IRON AND IRON BINDING CAPACITY (CC-WL,HP ONLY)
Iron: 134 ug/dL (ref 45–182)
Saturation Ratios: 31 % (ref 17.9–39.5)
TIBC: 433 ug/dL (ref 250–450)
UIBC: 299 ug/dL (ref 117–376)

## 2022-10-06 LAB — LACTATE DEHYDROGENASE: LDH: 135 U/L (ref 98–192)

## 2022-10-06 LAB — HIV ANTIBODY (ROUTINE TESTING W REFLEX): HIV Screen 4th Generation wRfx: NONREACTIVE

## 2022-10-06 LAB — FERRITIN: Ferritin: 23 ng/mL — ABNORMAL LOW (ref 24–336)

## 2022-10-06 LAB — FOLATE: Folate: 7.6 ng/mL (ref >5.9)
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
## 2022-10-07 LAB — HEPATITIS PANEL, ACUTE
HCV Ab: NONREACTIVE
Hep A IgM: NONREACTIVE
Hep B C IgM: NONREACTIVE
Hepatitis B Surface Ag: NONREACTIVE

## 2022-10-08 LAB — RHEUMATOID FACTOR: Rheumatoid fact SerPl-aCnc: 10 IU/mL (ref <14.0)

## 2022-10-09 LAB — FANA STAINING PATTERNS: Speckled Pattern: 24529 — ABNORMAL HIGH

## 2022-10-09 LAB — ANTINUCLEAR ANTIBODIES, IFA: ANA Ab, IFA: POSITIVE — AB

## 2022-10-18 ENCOUNTER — Inpatient Hospital Stay (HOSPITAL_COMMUNITY)
Admission: EM | Admit: 2022-10-18 | Discharge: 2022-10-21 | DRG: 200 | Disposition: A | Discharge status: Home / Self Care | Payer: Medicare HMO | Attending: Surgery | Admitting: Surgery

## 2022-10-18 ENCOUNTER — Encounter (HOSPITAL_COMMUNITY): Payer: Self-pay

## 2022-10-18 ENCOUNTER — Other Ambulatory Visit: Payer: Self-pay

## 2022-10-18 ENCOUNTER — Emergency Department (HOSPITAL_COMMUNITY): Payer: Medicare HMO

## 2022-10-18 ENCOUNTER — Observation Stay (HOSPITAL_COMMUNITY): Payer: Medicare HMO

## 2022-10-18 DIAGNOSIS — S2242XA Multiple fractures of ribs, left side, initial encounter for closed fracture: Secondary | ICD-10-CM | POA: Diagnosis present

## 2022-10-18 DIAGNOSIS — E876 Hypokalemia: Secondary | ICD-10-CM | POA: Diagnosis present

## 2022-10-18 DIAGNOSIS — S272XXA Traumatic hemopneumothorax, initial encounter: Principal | ICD-10-CM | POA: Diagnosis present

## 2022-10-18 DIAGNOSIS — Z560 Unemployment, unspecified: Secondary | ICD-10-CM

## 2022-10-18 DIAGNOSIS — S270XXA Traumatic pneumothorax, initial encounter: Secondary | ICD-10-CM | POA: Diagnosis not present

## 2022-10-18 DIAGNOSIS — F17210 Nicotine dependence, cigarettes, uncomplicated: Secondary | ICD-10-CM | POA: Diagnosis present

## 2022-10-18 DIAGNOSIS — J939 Pneumothorax, unspecified: Secondary | ICD-10-CM | POA: Diagnosis present

## 2022-10-18 DIAGNOSIS — E871 Hypo-osmolality and hyponatremia: Secondary | ICD-10-CM | POA: Diagnosis present

## 2022-10-18 DIAGNOSIS — Z8249 Family history of ischemic heart disease and other diseases of the circulatory system: Secondary | ICD-10-CM

## 2022-10-18 DIAGNOSIS — M109 Gout, unspecified: Secondary | ICD-10-CM | POA: Diagnosis not present

## 2022-10-18 DIAGNOSIS — Z794 Long term (current) use of insulin: Secondary | ICD-10-CM

## 2022-10-18 DIAGNOSIS — E119 Type 2 diabetes mellitus without complications: Secondary | ICD-10-CM | POA: Diagnosis present

## 2022-10-18 DIAGNOSIS — Z923 Personal history of irradiation: Secondary | ICD-10-CM

## 2022-10-18 DIAGNOSIS — Y92009 Unspecified place in unspecified non-institutional (private) residence as the place of occurrence of the external cause: Secondary | ICD-10-CM | POA: Diagnosis not present

## 2022-10-18 DIAGNOSIS — J9690 Respiratory failure, unspecified, unspecified whether with hypoxia or hypercapnia: Secondary | ICD-10-CM | POA: Diagnosis not present

## 2022-10-18 DIAGNOSIS — E785 Hyperlipidemia, unspecified: Secondary | ICD-10-CM | POA: Diagnosis not present

## 2022-10-18 DIAGNOSIS — J90 Pleural effusion, not elsewhere classified: Secondary | ICD-10-CM | POA: Diagnosis not present

## 2022-10-18 DIAGNOSIS — Z85118 Personal history of other malignant neoplasm of bronchus and lung: Secondary | ICD-10-CM | POA: Diagnosis not present

## 2022-10-18 DIAGNOSIS — Z9049 Acquired absence of other specified parts of digestive tract: Secondary | ICD-10-CM

## 2022-10-18 DIAGNOSIS — J449 Chronic obstructive pulmonary disease, unspecified: Secondary | ICD-10-CM | POA: Diagnosis present

## 2022-10-18 DIAGNOSIS — K7460 Unspecified cirrhosis of liver: Secondary | ICD-10-CM | POA: Diagnosis present

## 2022-10-18 DIAGNOSIS — K219 Gastro-esophageal reflux disease without esophagitis: Secondary | ICD-10-CM | POA: Diagnosis not present

## 2022-10-18 DIAGNOSIS — T465X6A Underdosing of other antihypertensive drugs, initial encounter: Secondary | ICD-10-CM | POA: Diagnosis present

## 2022-10-18 DIAGNOSIS — D61818 Other pancytopenia: Secondary | ICD-10-CM | POA: Diagnosis present

## 2022-10-18 DIAGNOSIS — S2232XA Fracture of one rib, left side, initial encounter for closed fracture: Secondary | ICD-10-CM | POA: Diagnosis not present

## 2022-10-18 DIAGNOSIS — I10 Essential (primary) hypertension: Secondary | ICD-10-CM | POA: Diagnosis present

## 2022-10-18 DIAGNOSIS — W01198A Fall on same level from slipping, tripping and stumbling with subsequent striking against other object, initial encounter: Secondary | ICD-10-CM | POA: Diagnosis present

## 2022-10-18 DIAGNOSIS — F1010 Alcohol abuse, uncomplicated: Secondary | ICD-10-CM | POA: Diagnosis present

## 2022-10-18 DIAGNOSIS — M1990 Unspecified osteoarthritis, unspecified site: Secondary | ICD-10-CM | POA: Diagnosis present

## 2022-10-18 DIAGNOSIS — R0789 Other chest pain: Secondary | ICD-10-CM | POA: Diagnosis not present

## 2022-10-18 DIAGNOSIS — Z9221 Personal history of antineoplastic chemotherapy: Secondary | ICD-10-CM

## 2022-10-18 DIAGNOSIS — J9811 Atelectasis: Secondary | ICD-10-CM | POA: Diagnosis not present

## 2022-10-18 DIAGNOSIS — Z79899 Other long term (current) drug therapy: Secondary | ICD-10-CM | POA: Diagnosis not present

## 2022-10-18 DIAGNOSIS — Z888 Allergy status to other drugs, medicaments and biological substances status: Secondary | ICD-10-CM | POA: Diagnosis not present

## 2022-10-18 DIAGNOSIS — L409 Psoriasis, unspecified: Secondary | ICD-10-CM | POA: Diagnosis not present

## 2022-10-18 DIAGNOSIS — J439 Emphysema, unspecified: Secondary | ICD-10-CM | POA: Diagnosis not present

## 2022-10-18 DIAGNOSIS — W19XXXA Unspecified fall, initial encounter: Principal | ICD-10-CM

## 2022-10-18 DIAGNOSIS — S0990XA Unspecified injury of head, initial encounter: Secondary | ICD-10-CM | POA: Diagnosis not present

## 2022-10-18 DIAGNOSIS — E1140 Type 2 diabetes mellitus with diabetic neuropathy, unspecified: Secondary | ICD-10-CM | POA: Diagnosis not present

## 2022-10-18 LAB — CBC WITH DIFFERENTIAL/PLATELET
Abs Immature Granulocytes: 0.01 10*3/uL (ref 0.00–0.07)
Basophils Absolute: 0 10*3/uL (ref 0.0–0.1)
Basophils Relative: 1 %
Eosinophils Absolute: 0.1 10*3/uL (ref 0.0–0.5)
Eosinophils Relative: 2 %
HCT: 37.7 % — ABNORMAL LOW (ref 39.0–52.0)
Hemoglobin: 12.9 g/dL — ABNORMAL LOW (ref 13.0–17.0)
Immature Granulocytes: 0 %
Lymphocytes Relative: 15 %
Lymphs Abs: 0.5 10*3/uL — ABNORMAL LOW (ref 0.7–4.0)
MCH: 32.3 pg (ref 26.0–34.0)
MCHC: 34.2 g/dL (ref 30.0–36.0)
MCV: 94.3 fL (ref 80.0–100.0)
Monocytes Absolute: 0.4 10*3/uL (ref 0.1–1.0)
Monocytes Relative: 13 %
Neutro Abs: 2.2 10*3/uL (ref 1.7–7.7)
Neutrophils Relative %: 69 %
Platelets: 89 10*3/uL — ABNORMAL LOW (ref 150–400)
RBC: 4 MIL/uL — ABNORMAL LOW (ref 4.22–5.81)
RDW: 21.5 % — ABNORMAL HIGH (ref 11.5–15.5)
WBC: 3.2 10*3/uL — ABNORMAL LOW (ref 4.0–10.5)
nRBC: 0 % (ref 0.0–0.2)

## 2022-10-18 LAB — BASIC METABOLIC PANEL
Anion gap: 13 (ref 5–15)
BUN: 20 mg/dL (ref 8–23)
CO2: 24 mmol/L (ref 22–32)
Calcium: 8.6 mg/dL — ABNORMAL LOW (ref 8.9–10.3)
Chloride: 95 mmol/L — ABNORMAL LOW (ref 98–111)
Creatinine, Ser: 1.18 mg/dL (ref 0.61–1.24)
GFR, Estimated: >60 mL/min (ref >60)
Glucose, Bld: 100 mg/dL — ABNORMAL HIGH (ref 70–99)
Potassium: 3.2 mmol/L — ABNORMAL LOW (ref 3.5–5.1)
Sodium: 132 mmol/L — ABNORMAL LOW (ref 135–145)

## 2022-10-18 LAB — TYPE AND SCREEN: ABO/RH(D): B POS

## 2022-10-18 MED ORDER — LORAZEPAM 2 MG/ML IJ SOLN: 1.0000 mg | INTRAMUSCULAR | Status: DC | PRN

## 2022-10-18 MED ORDER — MORPHINE SULFATE (PF) 4 MG/ML IV SOLN
4.0000 mg | Freq: Once | INTRAVENOUS | Status: AC
  Administered 2022-10-18: 4 mg via INTRAVENOUS
  Filled 2022-10-18: qty 1

## 2022-10-18 MED ORDER — IPRATROPIUM-ALBUTEROL 0.5-2.5 (3) MG/3ML IN SOLN: 3.0000 mL | Freq: Four times a day (QID) | RESPIRATORY_TRACT | Status: DC

## 2022-10-18 MED ORDER — HYDROMORPHONE HCL 1 MG/ML IJ SOLN
0.5000 mg | INTRAMUSCULAR | Status: DC | PRN
  Administered 2022-10-19: 0.5 mg via INTRAVENOUS
  Filled 2022-10-18: qty 1

## 2022-10-18 MED ORDER — SODIUM CHLORIDE 0.9 % IV BOLUS
500.0000 mL | Freq: Once | INTRAVENOUS | Status: AC
  Administered 2022-10-18: 500 mL via INTRAVENOUS

## 2022-10-18 MED ORDER — PANTOPRAZOLE SODIUM 40 MG PO TBEC
40.0000 mg | DELAYED_RELEASE_TABLET | Freq: Every day | ORAL | Status: DC
  Administered 2022-10-18 – 2022-10-21 (×4): 40 mg via ORAL
  Filled 2022-10-18 (×4): qty 1

## 2022-10-18 MED ORDER — POLYETHYLENE GLYCOL 3350 17 G PO PACK: 17.0000 g | PACK | Freq: Every day | ORAL | Status: DC | PRN

## 2022-10-18 MED ORDER — DOCUSATE SODIUM 100 MG PO CAPS
100.0000 mg | ORAL_CAPSULE | Freq: Two times a day (BID) | ORAL | Status: DC
  Administered 2022-10-18 – 2022-10-20 (×6): 100 mg via ORAL
  Filled 2022-10-18 (×6): qty 1

## 2022-10-18 MED ORDER — GUAIFENESIN 200 MG PO TABS
200.0000 mg | ORAL_TABLET | Freq: Four times a day (QID) | ORAL | Status: DC
  Administered 2022-10-19 – 2022-10-21 (×10): 200 mg via ORAL
  Filled 2022-10-18 (×14): qty 1

## 2022-10-18 MED ORDER — THIAMINE HCL 100 MG/ML IJ SOLN: 100.0000 mg | Freq: Every day | INTRAMUSCULAR | Status: DC

## 2022-10-18 MED ORDER — METHOCARBAMOL 500 MG PO TABS
500.0000 mg | ORAL_TABLET | Freq: Four times a day (QID) | ORAL | Status: DC
  Administered 2022-10-18 – 2022-10-20 (×7): 500 mg via ORAL
  Filled 2022-10-18 (×7): qty 1

## 2022-10-18 MED ORDER — SODIUM CHLORIDE 0.9 % IV SOLN: INTRAVENOUS | Status: DC

## 2022-10-18 MED ORDER — MORPHINE SULFATE (PF) 2 MG/ML IV SOLN
2.0000 mg | Freq: Once | INTRAVENOUS | Status: AC
  Administered 2022-10-18: 2 mg via INTRAVENOUS
  Filled 2022-10-18: qty 1

## 2022-10-18 MED ORDER — PROCHLORPERAZINE MALEATE 10 MG PO TABS: 10.0000 mg | ORAL_TABLET | Freq: Four times a day (QID) | ORAL | Status: DC | PRN

## 2022-10-18 MED ORDER — PROCHLORPERAZINE EDISYLATE 10 MG/2ML IJ SOLN: 5.0000 mg | Freq: Four times a day (QID) | INTRAMUSCULAR | Status: DC | PRN

## 2022-10-18 MED ORDER — LORAZEPAM 1 MG PO TABS: 1.0000 mg | ORAL_TABLET | ORAL | Status: DC | PRN

## 2022-10-18 MED ORDER — LIDOCAINE 5 % EX PTCH
1.0000 | MEDICATED_PATCH | CUTANEOUS | Status: DC
  Administered 2022-10-19 – 2022-10-20 (×2): 1 via TRANSDERMAL
  Filled 2022-10-18 (×2): qty 1

## 2022-10-18 MED ORDER — ONDANSETRON HCL 4 MG/2ML IJ SOLN: 4.0000 mg | Freq: Four times a day (QID) | INTRAMUSCULAR | Status: DC | PRN

## 2022-10-18 MED ORDER — ONDANSETRON 4 MG PO TBDP: 4.0000 mg | ORAL_TABLET | Freq: Four times a day (QID) | ORAL | Status: DC | PRN

## 2022-10-18 MED ORDER — OXYCODONE HCL 5 MG PO TABS
5.0000 mg | ORAL_TABLET | ORAL | Status: DC | PRN
  Administered 2022-10-18: 5 mg via ORAL
  Administered 2022-10-19 (×2): 10 mg via ORAL
  Administered 2022-10-19: 5 mg via ORAL
  Administered 2022-10-19 – 2022-10-20 (×4): 10 mg via ORAL
  Filled 2022-10-18 (×4): qty 2
  Filled 2022-10-18: qty 1
  Filled 2022-10-18: qty 2
  Filled 2022-10-18: qty 1
  Filled 2022-10-18: qty 2

## 2022-10-18 MED ORDER — IPRATROPIUM-ALBUTEROL 0.5-2.5 (3) MG/3ML IN SOLN
3.0000 mL | Freq: Four times a day (QID) | RESPIRATORY_TRACT | Status: AC
  Administered 2022-10-18 – 2022-10-19 (×4): 3 mL via RESPIRATORY_TRACT
  Filled 2022-10-18 (×4): qty 3

## 2022-10-18 MED ORDER — THIAMINE MONONITRATE 100 MG PO TABS
100.0000 mg | ORAL_TABLET | Freq: Every day | ORAL | Status: DC
  Administered 2022-10-18 – 2022-10-21 (×4): 100 mg via ORAL
  Filled 2022-10-18 (×4): qty 1

## 2022-10-18 MED ORDER — ADULT MULTIVITAMIN W/MINERALS CH
1.0000 | ORAL_TABLET | Freq: Every day | ORAL | Status: DC
  Administered 2022-10-18 – 2022-10-21 (×4): 1 via ORAL
  Filled 2022-10-18 (×4): qty 1

## 2022-10-18 MED ORDER — POTASSIUM CHLORIDE CRYS ER 20 MEQ PO TBCR
40.0000 meq | EXTENDED_RELEASE_TABLET | Freq: Two times a day (BID) | ORAL | Status: AC
  Administered 2022-10-18 (×2): 40 meq via ORAL
  Filled 2022-10-18 (×2): qty 2

## 2022-10-18 MED ORDER — OXYCODONE-ACETAMINOPHEN 5-325 MG PO TABS
1.0000 | ORAL_TABLET | Freq: Once | ORAL | Status: AC
  Administered 2022-10-18: 1 via ORAL
  Filled 2022-10-18: qty 1

## 2022-10-18 MED ORDER — HYDRALAZINE HCL 20 MG/ML IJ SOLN
10.0000 mg | INTRAMUSCULAR | Status: DC | PRN
  Administered 2022-10-19: 10 mg via INTRAVENOUS
  Filled 2022-10-18: qty 1

## 2022-10-18 MED ORDER — ACETAMINOPHEN 500 MG PO TABS
1000.0000 mg | ORAL_TABLET | Freq: Four times a day (QID) | ORAL | Status: DC
  Administered 2022-10-18 – 2022-10-21 (×11): 1000 mg via ORAL
  Filled 2022-10-18 (×11): qty 2

## 2022-10-18 MED ORDER — LIDOCAINE HCL 2 % IJ SOLN
INTRAMUSCULAR | Status: AC
  Administered 2022-10-18: 400 mg
  Filled 2022-10-18: qty 20

## 2022-10-18 MED ORDER — FOLIC ACID 1 MG PO TABS
1.0000 mg | ORAL_TABLET | Freq: Every day | ORAL | Status: DC
  Administered 2022-10-18 – 2022-10-21 (×4): 1 mg via ORAL
  Filled 2022-10-18 (×4): qty 1

## 2022-10-18 MED ORDER — AMLODIPINE BESYLATE 5 MG PO TABS
5.0000 mg | ORAL_TABLET | Freq: Every day | ORAL | Status: DC
  Administered 2022-10-18 – 2022-10-21 (×4): 5 mg via ORAL
  Filled 2022-10-18 (×4): qty 1

## 2022-10-18 MED ORDER — MORPHINE SULFATE (PF) 2 MG/ML IV SOLN
2.0000 mg | INTRAVENOUS | Status: AC
  Administered 2022-10-18: 2 mg via INTRAVENOUS
  Filled 2022-10-18: qty 1

## 2022-10-18 NOTE — ED Provider Notes (Signed)
[place] EMERGENCY DEPARTMENT AT [facility] Provider Note   CSN: [id number] Arrival date & time: 10/18/22  1127     History  Chief Complaint  Patient presents with   Marletta Lor    [name] is a 67 y.o. male.  67 year old male with prior medical history as detailed below presents for evaluation.  Patient reports that he fell on Saturday.  He tripped and fell at home.  He did hit his left ribs against a table as he went down.  He complains of significant pain to the left lateral ribs.  Pain is worse with movement or with deep breathing.  He denies shortness of breath.  He took some Tylenol at home with some improvement in his pain.  His last dose of Tylenol was yesterday evening.  He does report striking his head.  He denies LOC.  He denies neck pain.  He does not take anticoagulation medications.  The history is provided by the patient and medical records.       Home Medications Prior to Admission medications   Medication Sig Start Date End Date Taking? Authorizing Provider  omeprazole (PRILOSEC) 20 MG capsule TAKE 1 CAPSULE(20 MG) BY MOUTH DAILY 04/12/22   [name], MD  acetaminophen (TYLENOL) 500 MG tablet Take 500 mg by mouth every 6 (six) hours as needed for moderate pain.    [provider]  acetaZOLAMIDE (DIAMOX) 250 MG tablet Take 500 mg by mouth 2 (two) times daily. 11/26/20   [provider]  allopurinol (ZYLOPRIM) 300 MG tablet Take 1 tablet (300 mg total) by mouth daily. 12/31/21   [name], MD  ALPHAGAN P 0.1 % SOLN Apply to eye. 12/04/20   [provider]  amLODipine (NORVASC) 5 MG tablet 1 tablet 07/01/22   [name], MD  Apremilast 30 MG TABS Take 30 mg by mouth 2 (two) times daily.    [provider]  atorvastatin (LIPITOR) 40 MG tablet Take 1 tablet (40 mg total) by mouth daily. 12/31/21   [name], MD  Blood Glucose Monitoring Suppl (BAYER CONTOUR NEXT MONITOR) w/Device KIT Use as  directed 05/17/16   Quentin Angst, MD  buPROPion (WELLBUTRIN XL) 150 MG 24 hr tablet TAKE 1 TABLET EVERY DAY 10/06/20   [name], MD  cetirizine (ZYRTEC) 10 MG tablet Take 1 tablet (10 mg total) by mouth daily. 12/31/21   [name], MD  clobetasol (OLUX) 0.05 % topical foam 1 application to affected area    [provider]  coal tar (NEUTROGENA T-GEL) 0.5 % shampoo Apply 1 application topically daily.    [provider]  dorzolamide-timolol (COSOPT) 22.3-6.8 MG/ML ophthalmic solution SMARTSIG:In Eye(s) 12/04/20   [provider]  fluticasone (FLONASE) 50 MCG/ACT nasal spray Place 1 spray into both nostrils daily. 12/31/21   [name], MD  gabapentin (NEURONTIN) 300 MG capsule Take 2 capsules (600 mg total) by mouth at bedtime. 12/31/21   [name], MD  glucose blood (BAYER CONTOUR NEXT TEST) test strip Use as instructed 05/17/16   Quentin Angst, MD  hydrocortisone (ANUSOL-HC) 25 MG suppository Place 1 suppository (25 mg total) rectally 2 (two) times daily. 02/16/20   [name], PA-C  hydroxypropyl methylcellulose / hypromellose (ISOPTO TEARS / GONIOVISC) 2.5 % ophthalmic solution Place 1 drop into both eyes 3 (three) times daily as needed for dry eyes.    [provider]  Insulin Pen Needle (PEN NEEDLES) 31G X 6 MM MISC Use lantus every night  at hours of sleep Patient not taking: Reported on 12/31/2021 03/15/14   Holland Commons A, NP  MITIGARE 0.6 MG CAPS TAKE 2 CAPSULES BY MOUTH AT THE START OF GOUT ATTACK, MAY TAKE 1 CAPSULE AFTERWARDS IF SYMPTOMS PERSIST Patient not taking: Reported on 12/31/2021 02/19/19   [name], MD  naltrexone (DEPADE) 50 MG tablet Take 1 tablet (50 mg total) by mouth daily. 11/18/21   [name], MD  Omega-3 Fatty Acids (FISH OIL) 1200 MG CAPS Take 1,200 mg by mouth daily.     [provider]  OVER THE COUNTER MEDICATION Take 250 mg by mouth daily. Super Beta Prostate    [provider]  sildenafil (VIAGRA) 50 MG tablet Take 1 tablet (50 mg total) by mouth daily as needed for erectile dysfunction. At least 24 hours between doses Patient not taking: Reported on 12/31/2021 04/26/18   [name], MD  thiamine (VITAMIN B-1) 100 MG tablet Take 100 mg by mouth daily.    [provider]  tiZANidine (ZANAFLEX) 4 MG tablet Take 1 tablet (4 mg total) by mouth every 8 (eight) hours as needed for muscle spasms. 12/31/21   [name], MD  triamcinolone cream (KENALOG) 0.1 % Apply 1 Application topically 2 (two) times daily. 12/31/21   [name], MD      Allergies    Ace inhibitors and Furosemide    Review of Systems   Review of Systems  All other systems reviewed and are negative.   Physical Exam Updated Vital Signs BP (!) 158/105 (BP Location: Right Arm)   Pulse (!) 110   Temp 97.8 F (36.6 C) (Oral)   Resp 20   Ht 6\' 2"  (1.88 m)   Wt 82.6 kg   SpO2 98%   BMI 23.37 kg/m  Physical Exam Vitals and nursing note reviewed.  Constitutional:      General: He is not in acute distress.    Appearance: Normal appearance. He is well-developed.  HENT:     Head: Normocephalic and atraumatic.  Eyes:     Conjunctiva/sclera: Conjunctivae normal.     Pupils: Pupils are equal, round, and reactive to light.  Cardiovascular:     Rate and Rhythm: Normal rate and regular rhythm.     Heart sounds: Normal heart sounds.  Pulmonary:     Effort: Pulmonary effort is normal. No respiratory distress.     Breath sounds: Normal breath sounds.  Abdominal:     General: There is no distension.     Palpations: Abdomen is soft.     Tenderness: There is no abdominal tenderness.     Comments: Tender to the left lateral ribs with mild ecchymosis noted to area of maximal tenderness.  No crepitus or step-off appreciated.    Musculoskeletal:        General: No deformity. Normal range of motion.     Cervical back: Normal range of motion and neck supple.  Skin:     General: Skin is warm and dry.  Neurological:     General: No focal deficit present.     Mental Status: He is alert and oriented to person, place, and time. Mental status is at baseline.     Cranial Nerves: No cranial nerve deficit.     Sensory: No sensory deficit.     Motor: No weakness.     ED Results / Procedures / Treatments   Labs (all labs ordered are listed, but only abnormal results are displayed) Labs Reviewed  BASIC METABOLIC  PANEL - Abnormal; Notable for the following components:      Result Value   Sodium 132 (*)    Potassium 3.2 (*)    Chloride 95 (*)    Glucose, Bld 100 (*)    Calcium 8.6 (*)    All other components within normal limits  CBC WITH DIFFERENTIAL/PLATELET - Abnormal; Notable for the following components:   WBC 3.2 (*)    RBC 4.00 (*)    Hemoglobin 12.9 (*)    HCT 37.7 (*)    RDW 21.5 (*)    Platelets 89 (*)    Lymphs Abs 0.5 (*)    All other components within normal limits  CBC WITH DIFFERENTIAL/PLATELET  TYPE AND SCREEN    EKG EKG Interpretation  Date/Time:  Monday October 18 2022 12:42:59 EDT Ventricular Rate:  95 PR Interval:  112 QRS Duration: 95 QT Interval:  410 QTC Calculation: 470 R Axis:   62 Text Interpretation: Sinus rhythm Ventricular bigeminy Borderline short PR interval Anteroseptal infarct, old Confirmed by Kristine Royal [phone]) on 10/18/2022 12:49:49 PM  Radiology CT Chest Wo Contrast  Result Date: 10/18/2022 CLINICAL DATA:  Fall 2 days ago. Left-sided chest wall pain. History of lung carcinoma. EXAM: CT CHEST WITHOUT CONTRAST TECHNIQUE: Multidetector CT imaging of the chest was performed following the standard protocol without IV contrast. RADIATION DOSE REDUCTION: This exam was performed according to the departmental dose-optimization program which includes automated exposure control, adjustment of the mA and/or kV according to patient size and/or use of iterative reconstruction technique. COMPARISON:  10/04/2022 and  multiple older chest CTs. FINDINGS: Cardiovascular: Heart normal size. Trace pericardial effusion. Ascending thoracic aorta borderline enlarged, 3.9 cm. Main pulmonary artery enlarged, 3.7 cm. Minor aortic atherosclerotic calcifications. Mediastinum/Nodes: No neck base, mediastinal or left hilar masses. Scattered subcentimeter mediastinal and left hilar lymph nodes. Confluent soft tissue surrounds the right hilar structures with associated dystrophic calcifications, the latter also seen along the right paratracheal mediastinum. Mild irregular narrowing of the central bronchi. Trachea and esophagus are unremarkable. Lungs/Pleura: Moderate left pneumothorax. Collapse estimated at 40%. No right pneumothorax. Small left pleural effusion, mean Hounsfield units 36. No right pleural effusion. There is confluent opacity, with bronchial irregularity, Hyst ending from the right hilum into the right upper, middle and posterior lobes, stable from the prior CT, consistent with radiation therapy induced fibrosis. Mild dependent opacity no in the left lower lobe and at the diaphragmatic base consistent with atelectasis. No lung mass or suspicious nodule. No evidence of pulmonary edema or convincing pneumonia. Paraseptal and centrilobular emphysema is stable from the prior CT. Upper Abdomen: No acute findings. Musculoskeletal: Acute fractures, without significant displacement, of the posterior left eighth and ninth ribs. No other acute fractures. No bone lesion. No chest wall mass. IMPRESSION: 1. Moderate, approximally 40%, left pneumothorax associated with nondisplaced fractures the left posterior eighth and ninth ribs. 2. Small left pleural effusion demonstrates increased Hounsfield units of 36 suspected to be a hemothorax. 3. Associated dependent atelectasis in the left lower lobe. 4. No other acute abnormality in the chest. 5. Right perihilar soft tissue and contiguous lung opacity consistent with chronic fibrosis from  treatment of lung carcinoma, unchanged from the prior CT. 6. Stable centrilobular and paraseptal emphysema. Minor aortic atherosclerosis. 7. Enlarged main pulmonary artery consistent with pulmonary artery hypertension. Aortic Atherosclerosis (ICD10-I70.0) and Emphysema (ICD10-J43.9). Electronically Signed   By: Amie Portland M.D.   On: 10/18/2022 14:10   CT Head Wo Contrast  Result Date: 10/18/2022 CLINICAL DATA:  Head trauma EXAM: CT HEAD WITHOUT CONTRAST TECHNIQUE: Contiguous axial images were obtained from the base of the skull through the vertex without intravenous contrast. RADIATION DOSE REDUCTION: This exam was performed according to the departmental dose-optimization program which includes automated exposure control, adjustment of the mA and/or kV according to patient size and/or use of iterative reconstruction technique. COMPARISON:  CT head 06/21/20 FINDINGS: Brain: No evidence of acute infarction, hemorrhage, hydrocephalus, extra-axial collection or mass lesion/mass effect. Partially empty sella. Vascular: No hyperdense vessel or unexpected calcification. Skull: Normal. Negative for fracture or focal lesion. Sinuses/Orbits: No middle ear or mastoid effusion. Paranasal sinuses are clear. Bilateral lens replacement. Orbits are otherwise unremarkable. Other: None. IMPRESSION: No CT evidence of intracranial injury. Electronically Signed   By: Lorenza Cambridge M.D.   On: 10/18/2022 14:02   DG Ribs Unilateral W/Chest Left  Result Date: 10/18/2022 CLINICAL DATA:  Fall 2 days ago with left rib pain. EXAM: LEFT RIBS AND CHEST - 3+ VIEW COMPARISON:  CT chest 10/04/2022. FINDINGS: The cardiomediastinal silhouette is stable. Consolidation in the right perihilar region with associated volume loss and architectural distortion is unchanged. There is small left hydropneumothorax without evidence of tension. There is a minimally displaced fracture of the left posterior ninth rib. Metallic density foreign bodies  project over the abdomen, unchanged. IMPRESSION: Minimally displaced fracture of the left posterior ninth rib with associated small volume left hydropneumothorax. No evidence of tension. These results were called by telephone at the time of interpretation on 10/18/2022 at 12:16 pm to provider [facility] , who verbally acknowledged these results. Electronically Signed   By: Lesia Hausen M.D.   On: 10/18/2022 12:21    Procedures Procedures    Medications Ordered in ED Medications  oxyCODONE-acetaminophen (PERCOCET/ROXICET) 5-325 MG per tablet 1 tablet (1 tablet Oral Given 10/18/22 1147)    ED Course/ Medical Decision Making/ A&P                             Medical Decision Making Amount and/or Complexity of Data Reviewed Labs: ordered. Radiology: ordered.  Risk Prescription drug management. Decision regarding hospitalization.    Medical Screen Complete  This patient presented to the ED with complaint of fall, left-sided rib pain.  This complaint involves an extensive number of treatment options. The initial differential diagnosis includes, but is not limited to, trauma related to fall, fracture, pneumothorax, etc.  This presentation is: Acute, Self-Limited, Previously Undiagnosed, Uncertain Prognosis, Complicated, Systemic Symptoms, and Threat to Life/Bodily Function  Patient presents with complaint of fall that occurred 2 days ago.  Patient reports persistent pain to the left posterior lateral ribs.  Exam is consistent and concerning for likely rib fracture.  X-ray reveals evidence of rib fracture and associated likely hemopneumothorax.  Patient is without hypoxia or respiratory distress.  Patient's primary complaint is of left-sided rib pain.  CT imaging obtained.  Trauma surgery evaluation initiated.  Patient will likely acquire admission for further treatment.   Additional history obtained: External records from outside sources obtained and reviewed including  prior ED visits and prior Inpatient records.    Lab Tests:  I ordered and personally interpreted labs.  The pertinent results include: CBC, BMP, type and screen   Imaging Studies ordered:  I ordered imaging studies including plain films of chest and left ribs, CT head, CT chest I independently visualized and interpreted obtained imaging which showed left-sided pneumothorax with associated rib fractures I agree with the  radiologist interpretation.   Cardiac Monitoring:  The patient was maintained on a cardiac monitor.  I personally viewed and interpreted the cardiac monitor which showed an underlying rhythm of: NSR   Medicines ordered:  I ordered medication including Percocet, morphine for pain Reevaluation of the patient after these medicines showed that the patient: improved   Problem List / ED Course:  Fall, left-sided rib fractures, pneumothorax   Reevaluation:  After the interventions noted above, I reevaluated the patient and found that they have: improved   Disposition:  After consideration of the diagnostic results and the patients response to treatment, I feel that the patent would benefit from admission.          Final Clinical Impression(s) / ED Diagnoses Final diagnoses:  Fall, initial encounter  Closed fracture of multiple ribs of left side, initial encounter  Pneumothorax, unspecified type    Rx / DC Orders ED Discharge Orders     None         Wynetta Fines, MD 10/18/22 1455

## 2022-10-18 NOTE — ED Notes (Signed)
[facility] called. 

## 2022-10-18 NOTE — ED Notes (Signed)
Trauma Event Note    Pt was a transfer from [facility] ED with a 40% pneumothorax on the left side from a fall a few days ago.   Consent signed for chest tube insertion, Time out prior to procedure.   Assisted Dr. Bedelia Person with chest tube insertion in room. Pt received 4mg  morphine in 2 mg increments IV. Tolereated procedure well.  Last imported Vital Signs BP (!) 180/107   Pulse 91   Temp 97.8 F (36.6 C) (Oral)   Resp (!) 24   Ht 6\' 2"  (1.88 [name])   Wt 182 lb (82.6 kg)   SpO2 100%   BMI 23.37 kg/[name]   Trending CBC Recent Labs    10/18/22 1240  WBC 3.2*  HGB 12.9*  HCT 37.7*  PLT 89*    Trending Coag's No results for input(s): "APTT", "INR" in the last 72 hours.  Trending BMET Recent Labs    10/18/22 1240  NA 132*  K 3.2*  CL 95*  CO2 24  BUN 20  CREATININE 1.18  GLUCOSE 100*      [name] [name]  Trauma Response RN  Please call TRN at [phone] for further assistance.

## 2022-10-18 NOTE — ED Notes (Signed)
Patient transported to CT 

## 2022-10-18 NOTE — ED Notes (Signed)
Report given to [place] charge RN jamie

## 2022-10-18 NOTE — TOC CAGE-AID Note (Signed)
Transition of Care [facility]) - CAGE-AID Screening   Patient Details  Name: [name] MRN: [id number] Date of Birth: 06-15-1956   Hewitt Shorts, RN Trauma Response Nurse Phone Number: [phone] 10/18/2022, 6:51 PM   Clinical Narrative:  Transfer from [facility] ED with pneumothorax-and left 8th and 9th rib fx.  Hx of lung Ca  CAGE-AID Screening:    Have You Ever Felt You Ought to Cut Down on Your Drinking or Drug Use?: No Have People Annoyed You By [employer] Your Drinking Or Drug Use?: No Have You Felt Bad Or Guilty About Your Drinking Or Drug Use?: No Have You Ever Had a Drink or Used Drugs First Thing In The Morning to Steady Your Nerves or to Get Rid of a Hangover?: No CAGE-AID Score: 0  Substance Abuse Education Offered: No (Pt declines information regarding alcohol use-- states "I drink about a pint or more- depending on the day and who I am with" denies ever having withdrawal symptoms)
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
## 2022-10-18 NOTE — Procedures (Signed)
   Procedure Note  Date: 10/18/2022  Procedure: tube thoracostomy--left    Pre-op diagnosis: left pneumothorax  Post-op diagnosis: same  Surgeon: Diamantina Monks, MD  Anesthesia: local   EBL: <5cc procedural; 0cc evacuated Drains/Implants: 6F chest tube Specimen: none  Description of procedure: Time-out was performed verifying correct patient, procedure, site, laterality, and signature of informed consent. Fifteen cc's of local anesthetic was infiltrated into the tissues just over the fourth intercostal space.  A small skin nick was made at the fourth intercostal space. An introducer needle was inserted and a guidewire inserted through the needle. The needle was removed and the tract dilated. The chest tube was inserted over the guidewire and the guidewire removed.   The tube was secured at the skin with suture and connected to an atrium at -20cm water wall suction. Immediate output from the chest tube was 0cc. The site was dressed with gauze, and tape. The patient tolerated the procedure well. There were no complications. Follow up chest x-ray was ordered to confirm tube positioning, complete evacuation, and complete lung re-expansion.    Diamantina Monks, MD General and Trauma Surgery [facility]

## 2022-10-18 NOTE — ED Triage Notes (Addendum)
Pt states he had a mechanical fall on Saturday. Pt denies LOC. Pt states he did hit his head, knot to left side of head.. Pt mostly c/o left rib pain, pain with deep breathing. Not on thinners  Pt did not take HTN meds this AM

## 2022-10-19 ENCOUNTER — Observation Stay (HOSPITAL_COMMUNITY): Payer: Medicare HMO

## 2022-10-19 DIAGNOSIS — Y92009 Unspecified place in unspecified non-institutional (private) residence as the place of occurrence of the external cause: Secondary | ICD-10-CM | POA: Diagnosis not present

## 2022-10-19 DIAGNOSIS — Z560 Unemployment, unspecified: Secondary | ICD-10-CM | POA: Diagnosis not present

## 2022-10-19 DIAGNOSIS — D61818 Other pancytopenia: Secondary | ICD-10-CM | POA: Diagnosis present

## 2022-10-19 DIAGNOSIS — Z79899 Other long term (current) drug therapy: Secondary | ICD-10-CM | POA: Diagnosis not present

## 2022-10-19 DIAGNOSIS — Z888 Allergy status to other drugs, medicaments and biological substances status: Secondary | ICD-10-CM | POA: Diagnosis not present

## 2022-10-19 DIAGNOSIS — E876 Hypokalemia: Secondary | ICD-10-CM | POA: Diagnosis present

## 2022-10-19 DIAGNOSIS — M1990 Unspecified osteoarthritis, unspecified site: Secondary | ICD-10-CM | POA: Diagnosis present

## 2022-10-19 DIAGNOSIS — J449 Chronic obstructive pulmonary disease, unspecified: Secondary | ICD-10-CM | POA: Diagnosis present

## 2022-10-19 DIAGNOSIS — E871 Hypo-osmolality and hyponatremia: Secondary | ICD-10-CM | POA: Diagnosis present

## 2022-10-19 DIAGNOSIS — I10 Essential (primary) hypertension: Secondary | ICD-10-CM | POA: Diagnosis present

## 2022-10-19 DIAGNOSIS — J939 Pneumothorax, unspecified: Secondary | ICD-10-CM | POA: Diagnosis present

## 2022-10-19 DIAGNOSIS — E119 Type 2 diabetes mellitus without complications: Secondary | ICD-10-CM | POA: Diagnosis present

## 2022-10-19 DIAGNOSIS — Z8249 Family history of ischemic heart disease and other diseases of the circulatory system: Secondary | ICD-10-CM | POA: Diagnosis not present

## 2022-10-19 DIAGNOSIS — J9690 Respiratory failure, unspecified, unspecified whether with hypoxia or hypercapnia: Secondary | ICD-10-CM | POA: Diagnosis not present

## 2022-10-19 DIAGNOSIS — M109 Gout, unspecified: Secondary | ICD-10-CM | POA: Diagnosis present

## 2022-10-19 DIAGNOSIS — Z85118 Personal history of other malignant neoplasm of bronchus and lung: Secondary | ICD-10-CM | POA: Diagnosis not present

## 2022-10-19 DIAGNOSIS — W01198A Fall on same level from slipping, tripping and stumbling with subsequent striking against other object, initial encounter: Secondary | ICD-10-CM | POA: Diagnosis present

## 2022-10-19 DIAGNOSIS — K219 Gastro-esophageal reflux disease without esophagitis: Secondary | ICD-10-CM | POA: Diagnosis present

## 2022-10-19 DIAGNOSIS — K7460 Unspecified cirrhosis of liver: Secondary | ICD-10-CM | POA: Diagnosis present

## 2022-10-19 DIAGNOSIS — F1010 Alcohol abuse, uncomplicated: Secondary | ICD-10-CM | POA: Diagnosis present

## 2022-10-19 DIAGNOSIS — S272XXA Traumatic hemopneumothorax, initial encounter: Secondary | ICD-10-CM | POA: Diagnosis present

## 2022-10-19 DIAGNOSIS — Z794 Long term (current) use of insulin: Secondary | ICD-10-CM | POA: Diagnosis not present

## 2022-10-19 DIAGNOSIS — Z9221 Personal history of antineoplastic chemotherapy: Secondary | ICD-10-CM | POA: Diagnosis not present

## 2022-10-19 DIAGNOSIS — L409 Psoriasis, unspecified: Secondary | ICD-10-CM | POA: Diagnosis present

## 2022-10-19 DIAGNOSIS — S2242XA Multiple fractures of ribs, left side, initial encounter for closed fracture: Secondary | ICD-10-CM | POA: Diagnosis present

## 2022-10-19 DIAGNOSIS — F17210 Nicotine dependence, cigarettes, uncomplicated: Secondary | ICD-10-CM | POA: Diagnosis present

## 2022-10-19 DIAGNOSIS — E785 Hyperlipidemia, unspecified: Secondary | ICD-10-CM | POA: Diagnosis present

## 2022-10-19 DIAGNOSIS — J9811 Atelectasis: Secondary | ICD-10-CM | POA: Diagnosis not present

## 2022-10-19 DIAGNOSIS — J90 Pleural effusion, not elsewhere classified: Secondary | ICD-10-CM | POA: Diagnosis not present

## 2022-10-19 LAB — CBC
HCT: 34.5 % — ABNORMAL LOW (ref 39.0–52.0)
Hemoglobin: 12 g/dL — ABNORMAL LOW (ref 13.0–17.0)
MCH: 31.7 pg (ref 26.0–34.0)
MCHC: 34.8 g/dL (ref 30.0–36.0)
MCV: 91.3 fL (ref 80.0–100.0)
Platelets: 80 10*3/uL — ABNORMAL LOW (ref 150–400)
RBC: 3.78 MIL/uL — ABNORMAL LOW (ref 4.22–5.81)
RDW: 20.1 % — ABNORMAL HIGH (ref 11.5–15.5)
WBC: 2.8 10*3/uL — ABNORMAL LOW (ref 4.0–10.5)
nRBC: 0 % (ref 0.0–0.2)

## 2022-10-19 LAB — BASIC METABOLIC PANEL
Anion gap: 10 (ref 5–15)
BUN: 18 mg/dL (ref 8–23)
CO2: 23 mmol/L (ref 22–32)
Calcium: 8.2 mg/dL — ABNORMAL LOW (ref 8.9–10.3)
Chloride: 97 mmol/L — ABNORMAL LOW (ref 98–111)
Creatinine, Ser: 1.29 mg/dL — ABNORMAL HIGH (ref 0.61–1.24)
GFR, Estimated: >60 mL/min (ref >60)
Glucose, Bld: 115 mg/dL — ABNORMAL HIGH (ref 70–99)
Potassium: 3.2 mmol/L — ABNORMAL LOW (ref 3.5–5.1)
Sodium: 130 mmol/L — ABNORMAL LOW (ref 135–145)

## 2022-10-19 LAB — TYPE AND SCREEN
Antibody Screen: POSITIVE
DAT, IgG: POSITIVE

## 2022-10-19 LAB — MAGNESIUM
Magnesium: 0.9 mg/dL — CL (ref 1.7–2.4)
Magnesium: 1.9 mg/dL (ref 1.7–2.4)

## 2022-10-19 MED ORDER — POTASSIUM CHLORIDE CRYS ER 20 MEQ PO TBCR
40.0000 meq | EXTENDED_RELEASE_TABLET | Freq: Once | ORAL | Status: AC
  Administered 2022-10-19: 40 meq via ORAL
  Filled 2022-10-19: qty 2

## 2022-10-19 MED ORDER — MAGNESIUM SULFATE 4 GM/100ML IV SOLN
4.0000 g | Freq: Once | INTRAVENOUS | Status: AC
  Administered 2022-10-19: 4 g via INTRAVENOUS
  Filled 2022-10-19: qty 100

## 2022-10-19 MED ORDER — MAGNESIUM SULFATE 2 GM/50ML IV SOLN
2.0000 g | Freq: Once | INTRAVENOUS | Status: AC
  Administered 2022-10-19: 2 g via INTRAVENOUS
  Filled 2022-10-19: qty 50

## 2022-10-19 MED ORDER — POTASSIUM CHLORIDE 20 MEQ PO PACK
40.0000 meq | PACK | Freq: Once | ORAL | Status: AC
  Administered 2022-10-19: 40 meq via ORAL
  Filled 2022-10-19: qty 2

## 2022-10-19 MED ORDER — POTASSIUM CHLORIDE 20 MEQ PO PACK: 40.0000 meq | PACK | Freq: Two times a day (BID) | ORAL | Status: DC

## 2022-10-19 MED ORDER — BUPROPION HCL ER (XL) 150 MG PO TB24
150.0000 mg | ORAL_TABLET | Freq: Every day | ORAL | Status: DC
  Administered 2022-10-19 – 2022-10-21 (×3): 150 mg via ORAL
  Filled 2022-10-19 (×3): qty 1

## 2022-10-19 MED ORDER — ALLOPURINOL 300 MG PO TABS
300.0000 mg | ORAL_TABLET | Freq: Every day | ORAL | Status: DC
  Administered 2022-10-19 – 2022-10-21 (×3): 300 mg via ORAL
  Filled 2022-10-19 (×3): qty 1

## 2022-10-19 MED ORDER — SODIUM CHLORIDE 1 G PO TABS
1.0000 g | ORAL_TABLET | Freq: Three times a day (TID) | ORAL | Status: DC
  Administered 2022-10-19 – 2022-10-20 (×3): 1 g via ORAL
  Filled 2022-10-19 (×3): qty 1
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
## 2022-10-19 NOTE — Progress Notes (Signed)
Pt arrives to room 10 from ED. Initiated tele. Oriented pt to the unit. VSS. Call bell within reach.   Lawson Radar, RN

## 2022-10-19 NOTE — Evaluation (Signed)
Physical Therapy Evaluation Patient Details Name: [name] MRN: [id number] DOB: 1955/07/23 Today's Date: 10/19/2022  History of Present Illness  67 y.o. male presents to [facility] hospital on 10/18/2022 after a fall. Pt found to have L posterior 8-9 rib fxs with L PTX and small hemothorax. Chest placed 4/15. PMH includes HTN, HLD, GERD, stage IIIA R lung squamous cell carcinoma.  Clinical Impression  Pt presents to PT with deficits in activity tolerance, gait, power, endurance. Pt is mobilizing well, reporting mild SOB when ambulating although sats stable in low 90s on room air. PT encourages frequent mobilization with staff assistance along with incentive spirometer use. Acute PT will continue to follow this admission, no post-acute PT needs anticipated.       Recommendations for follow up therapy are one component of a multi-disciplinary discharge planning process, led by the attending physician.  Recommendations may be updated based on patient status, additional functional criteria and insurance authorization.  Follow Up Recommendations       Assistance Recommended at Discharge PRN  Patient can return home with the following  A little help with bathing/dressing/bathroom;Assistance with cooking/housework;Assist for transportation;Help with stairs or ramp for entrance    Equipment Recommendations None recommended by PT  Recommendations for Other Services       Functional Status Assessment Patient has had a recent decline in their functional status and demonstrates the ability to make significant improvements in function in a reasonable and predictable amount of time.     Precautions / Restrictions Precautions Precautions: Fall Precaution Comments: chest tube Restrictions Weight Bearing Restrictions: No      Mobility  Bed Mobility Overal bed mobility: Needs Assistance Bed Mobility: Supine to Sit, Sit to Supine     Supine to sit: Supervision Sit to supine: Supervision    General bed mobility comments: line management    Transfers Overall transfer level: Needs assistance Equipment used: None Transfers: Sit to/from Stand Sit to Stand: Supervision                Ambulation/Gait Ambulation/Gait assistance: Supervision Gait Distance (Feet): 300 Feet Assistive device: None Gait Pattern/deviations: Step-through pattern Gait velocity: functional Gait velocity interpretation: 1.31 - 2.62 ft/sec, indicative of limited community ambulator   General Gait Details: slowed step-through gait, no significant balance deviations  Stairs            Wheelchair Mobility    Modified Rankin (Stroke Patients Only)       Balance Overall balance assessment: Needs assistance Sitting-balance support: No upper extremity supported, Feet supported Sitting balance-Leahy Scale: Good     Standing balance support: No upper extremity supported, During functional activity Standing balance-Leahy Scale: Good                               Pertinent Vitals/Pain Pain Assessment Pain Assessment: 0-10 Pain Score: 7  Pain Location: ribs Pain Descriptors / Indicators: Aching Pain Intervention(s): Monitored during session    Home Living Family/patient expects to be discharged to:: Private residence Living Arrangements: Other relatives Available Help at Discharge: Family;Available PRN/intermittently Type of Home: House Home Access: Stairs to enter Entrance Stairs-Rails: Right Entrance Stairs-Number of Steps: 10   Home Layout: One level Home Equipment: None      Prior Function Prior Level of Function : Independent/Modified Independent             Mobility Comments: enjoys gardening and working on cars  Hand Dominance        Extremity/Trunk Assessment   Upper Extremity Assessment Upper Extremity Assessment: Overall WFL for tasks assessed    Lower Extremity Assessment Lower Extremity Assessment: Overall WFL for tasks  assessed    Cervical / Trunk Assessment Cervical / Trunk Assessment: Normal  Communication   Communication: No difficulties  Cognition Arousal/Alertness: Awake/alert Behavior During Therapy: WFL for tasks assessed/performed Overall Cognitive Status: Within Functional Limits for tasks assessed                                          General Comments General comments (skin integrity, edema, etc.): VSS on RA, sats in low 90s with ambulation    Exercises     Assessment/Plan    PT Assessment Patient needs continued PT services  PT Problem List Decreased activity tolerance;Decreased mobility;Cardiopulmonary status limiting activity       PT Treatment Interventions DME instruction;Gait training;Stair training;Functional mobility training;Therapeutic activities;Neuromuscular re-education;Balance training;Patient/family education    PT Goals (Current goals can be found in the Care Plan section)  Acute Rehab PT Goals Patient Stated Goal: to return to independence PT Goal Formulation: With patient Time For Goal Achievement: 11/02/22 Potential to Achieve Goals: Good Additional Goals Additional Goal #1: Pt will score >19/24 on the DGI to indicate a reduced risk for falls Additional Goal #2: Pt will report 0/4 DOE when ambulating for >500' to demonstrate improved activity tolerance    Frequency Min 3X/week     Co-evaluation               AM-PAC PT "6 Clicks" Mobility  Outcome Measure Help needed turning from your back to your side while in a flat bed without using bedrails?: A Little Help needed moving from lying on your back to sitting on the side of a flat bed without using bedrails?: A Little Help needed moving to and from a bed to a chair (including a wheelchair)?: A Little Help needed standing up from a chair using your arms (e.g., wheelchair or bedside chair)?: A Little Help needed to walk in hospital room?: A Little Help needed climbing 3-5 steps  with a railing? : A Little 6 Click Score: 18    End of Session   Activity Tolerance: Patient tolerated treatment well Patient left: in bed;with call bell/phone within reach Nurse Communication: Mobility status PT Visit Diagnosis: Other abnormalities of gait and mobility (R26.89);Muscle weakness (generalized) (M62.81);Pain Pain - Right/Left: Right Pain - part of body:  (ribs)    Time: 1937-9024 PT Time Calculation (min) (ACUTE ONLY): 20 min   Charges:   PT Evaluation $PT Eval Low Complexity: 1 Low          Arlyss Gandy, PT, DPT Acute Rehabilitation Office [phone]   Arlyss Gandy 10/19/2022, 10:53 AM

## 2022-10-19 NOTE — ED Notes (Signed)
Date and time results received: 10/19/22 0557 (use smartphrase ".now" to insert current time)  Test: Magnesium  Critical Value: 0.9  Name of Provider Notified: Sophronia Simas MD  Orders Received? Or Actions Taken?:  MD to place orders

## 2022-10-19 NOTE — Progress Notes (Signed)
[facility] Progress Note     Subjective: CC:  Reports left chest wall pain. Tolerating PO. Per RN mobilized well this AM   Objective: Vital signs in last 24 hours: Temp:  [97.1 F (36.2 C)-98 F (36.7 C)] 97.8 F (36.6 C) (04/16 0754) Pulse Rate:  [66-110] 90 (04/16 1009) Resp:  [16-27] 16 (04/16 0754) BP: (124-191)/(81-145) 132/93 (04/16 1009) SpO2:  [94 %-100 %] 94 % (04/16 1009) Weight:  [82.6 kg] 82.6 kg (04/15 1134)    Intake/Output from previous day: 04/15 0701 - 04/16 0700 In: 939 [I.V.:439; IV Piggyback:500] Out: 110 [Chest Tube:110] Intake/Output this shift: Total I/O In: 385.8 [I.V.:285.8; IV Piggyback:100] Out: -   PE: Gen:  Alert, NAD, pleasant Card:  Regular rate and rhythm, no lower extremity edema  Pulm:  Normal effort, clear to auscultation bilaterally, chest tube without air leak Abd: Soft, non-tender, non-distended Skin: warm and dry, no rashes  Psych: A&Ox3   Lab Results:  Recent Labs    10/18/22 1240 10/19/22 0430  WBC 3.2* 2.8*  HGB 12.9* 12.0*  HCT 37.7* 34.5*  PLT 89* 80*   BMET Recent Labs    10/18/22 1240 10/19/22 0430  NA 132* 130*  K 3.2* 3.2*  CL 95* 97*  CO2 24 23  GLUCOSE 100* 115*  BUN 20 18  CREATININE 1.18 1.29*  CALCIUM 8.6* 8.2*   PT/INR No results for input(s): "LABPROT", "INR" in the last 72 hours. CMP     Component Value Date/Time   NA 130 (L) 10/19/2022 0430   NA 135 07/01/2022 1152   NA 137 07/07/2017 0839   K 3.2 (L) 10/19/2022 0430   K 3.4 (L) 07/07/2017 0839   CL 97 (L) 10/19/2022 0430   CO2 23 10/19/2022 0430   CO2 21 (L) 07/07/2017 0839   GLUCOSE 115 (H) 10/19/2022 0430   GLUCOSE 154 (H) 07/07/2017 0839   BUN 18 10/19/2022 0430   BUN 16 07/01/2022 1152   BUN 17.0 07/07/2017 0839   CREATININE 1.29 (H) 10/19/2022 0430   CREATININE 1.52 (H) 10/04/2022 0923   CREATININE 1.2 07/07/2017 0839   CALCIUM 8.2 (L) 10/19/2022 0430   CALCIUM 8.9 07/07/2017 0839   PROT 7.7 10/04/2022  0923   PROT 8.5 12/31/2021 1137   PROT 8.3 07/07/2017 0839   ALBUMIN 3.6 10/04/2022 0923   ALBUMIN 4.0 12/31/2021 1137   ALBUMIN 3.5 07/07/2017 0839   AST 27 10/04/2022 0923   AST 20 07/07/2017 0839   ALT 10 10/04/2022 0923   ALT 13 07/07/2017 0839   ALKPHOS 151 (H) 10/04/2022 0923   ALKPHOS 120 07/07/2017 0839   BILITOT 0.5 10/04/2022 0923   BILITOT 0.41 07/07/2017 0839   GFRNONAA >60 10/19/2022 0430   GFRNONAA 50 (L) 10/04/2022 0923   GFRNONAA 64 02/09/2016 0949   GFRAA 58 (L) 02/16/2020 0323   GFRAA 58 (L) 11/23/2019 1256   GFRAA 74 02/09/2016 0949   Lipase     Component Value Date/Time   LIPASE 58 02/20/2011 0355       Studies/Results: DG Chest Port 1 View  Result Date: 10/19/2022 CLINICAL DATA:  [address] with respiratory failure and left pneumothorax with chest tube in place. EXAM: PORTABLE CHEST 1 VIEW COMPARISON:  Portable chest yesterday at 5:14 p.m. FINDINGS: 5:18 a.m. Pigtail left chest tube remains stable in positioning with the pigtail adjacent the aortic knob. There is improving chest wall emphysema alongside the tube entry. Today there is no visible pneumothorax. Mediastinal shift continues to improve.  Small left pleural effusion is similar. There is stable left infrahilar atelectasis. Stable right perihilar opacity/XRT change with radiating stranding to the chest wall and diaphragmatic interface. No new or significant further lung opacity is seen. There are COPD changes. The cardiac size is normal. Tortuous aorta with calcification, stable mediastinum.No acute osseous findings. IMPRESSION: 1. No visible pneumothorax. Improving chest wall emphysema. 2. Stable small left pleural effusion and left infrahilar atelectasis. 3. Stable right perihilar opacity/XRT change. Electronically Signed   By: Almira Bar M.D.   On: 10/19/2022 06:04   DG CHEST PORT 1 VIEW  Result Date: 10/18/2022 CLINICAL DATA:  Chest tube in place this. EXAM: PORTABLE CHEST 1 VIEW COMPARISON:   Radiograph earlier today. CT earlier today FINDINGS: Placement of pigtail catheter, pigtail portion loops centrally in the mid hemithorax. Near completely resolved left pneumothorax with questionable residual at the apex. Diminished mediastinal shift. There is new subcutaneous emphysema in the left lateral chest wall. Small amount of left pleural fluid. Unchanged right perihilar opacity. IMPRESSION: 1. Placement of pigtail catheter in the mid hemithorax. Near completely resolved left pneumothorax with questionable residual at the apex. Diminished mediastinal shift. 2. New subcutaneous emphysema in the left lateral chest wall. 3. Unchanged right perihilar opacity. Electronically Signed   By: Narda Rutherford M.D.   On: 10/18/2022 17:20   CT Chest Wo Contrast  Result Date: 10/18/2022 CLINICAL DATA:  Fall 2 days ago. Left-sided chest wall pain. History of lung carcinoma. EXAM: CT CHEST WITHOUT CONTRAST TECHNIQUE: Multidetector CT imaging of the chest was performed following the standard protocol without IV contrast. RADIATION DOSE REDUCTION: This exam was performed according to the departmental dose-optimization program which includes automated exposure control, adjustment of the mA and/or kV according to patient size and/or use of iterative reconstruction technique. COMPARISON:  10/04/2022 and multiple older chest CTs. FINDINGS: Cardiovascular: Heart normal size. Trace pericardial effusion. Ascending thoracic aorta borderline enlarged, 3.9 cm. Main pulmonary artery enlarged, 3.7 cm. Minor aortic atherosclerotic calcifications. Mediastinum/Nodes: No neck base, mediastinal or left hilar masses. Scattered subcentimeter mediastinal and left hilar lymph nodes. Confluent soft tissue surrounds the right hilar structures with associated dystrophic calcifications, the latter also seen along the right paratracheal mediastinum. Mild irregular narrowing of the central bronchi. Trachea and esophagus are unremarkable.  Lungs/Pleura: Moderate left pneumothorax. Collapse estimated at 40%. No right pneumothorax. Small left pleural effusion, mean Hounsfield units 36. No right pleural effusion. There is confluent opacity, with bronchial irregularity, Hyst ending from the right hilum into the right upper, middle and posterior lobes, stable from the prior CT, consistent with radiation therapy induced fibrosis. Mild dependent opacity no in the left lower lobe and at the diaphragmatic base consistent with atelectasis. No lung mass or suspicious nodule. No evidence of pulmonary edema or convincing pneumonia. Paraseptal and centrilobular emphysema is stable from the prior CT. Upper Abdomen: No acute findings. Musculoskeletal: Acute fractures, without significant displacement, of the posterior left eighth and ninth ribs. No other acute fractures. No bone lesion. No chest wall mass. IMPRESSION: 1. Moderate, approximally 40%, left pneumothorax associated with nondisplaced fractures the left posterior eighth and ninth ribs. 2. Small left pleural effusion demonstrates increased Hounsfield units of 36 suspected to be a hemothorax. 3. Associated dependent atelectasis in the left lower lobe. 4. No other acute abnormality in the chest. 5. Right perihilar soft tissue and contiguous lung opacity consistent with chronic fibrosis from treatment of lung carcinoma, unchanged from the prior CT. 6. Stable centrilobular and paraseptal emphysema. Minor aortic atherosclerosis.  7. Enlarged main pulmonary artery consistent with pulmonary artery hypertension. Aortic Atherosclerosis (ICD10-I70.0) and Emphysema (ICD10-J43.9). Electronically Signed   By: Amie Portland M.D.   On: 10/18/2022 14:10   CT Head Wo Contrast  Result Date: 10/18/2022 CLINICAL DATA:  Head trauma EXAM: CT HEAD WITHOUT CONTRAST TECHNIQUE: Contiguous axial images were obtained from the base of the skull through the vertex without intravenous contrast. RADIATION DOSE REDUCTION: This exam was  performed according to the departmental dose-optimization program which includes automated exposure control, adjustment of the mA and/or kV according to patient size and/or use of iterative reconstruction technique. COMPARISON:  CT head 06/21/20 FINDINGS: Brain: No evidence of acute infarction, hemorrhage, hydrocephalus, extra-axial collection or mass lesion/mass effect. Partially empty sella. Vascular: No hyperdense vessel or unexpected calcification. Skull: Normal. Negative for fracture or focal lesion. Sinuses/Orbits: No middle ear or mastoid effusion. Paranasal sinuses are clear. Bilateral lens replacement. Orbits are otherwise unremarkable. Other: None. IMPRESSION: No CT evidence of intracranial injury. Electronically Signed   By: Lorenza Cambridge M.D.   On: 10/18/2022 14:02   DG Ribs Unilateral W/Chest Left  Result Date: 10/18/2022 CLINICAL DATA:  Fall 2 days ago with left rib pain. EXAM: LEFT RIBS AND CHEST - 3+ VIEW COMPARISON:  CT chest 10/04/2022. FINDINGS: The cardiomediastinal silhouette is stable. Consolidation in the right perihilar region with associated volume loss and architectural distortion is unchanged. There is small left hydropneumothorax without evidence of tension. There is a minimally displaced fracture of the left posterior ninth rib. Metallic density foreign bodies project over the abdomen, unchanged. IMPRESSION: Minimally displaced fracture of the left posterior ninth rib with associated small volume left hydropneumothorax. No evidence of tension. These results were called by telephone at the time of interpretation on 10/18/2022 at 12:16 pm to provider [facility] , who verbally acknowledged these results. Electronically Signed   By: Lesia Hausen M.D.   On: 10/18/2022 12:21    Anti-infectives: Anti-infectives (From admission, onward)    None       Assessment/Plan Fall 4/13 L 8-9th rib fx's with HPTX - s/p pigtail chest tube 4/15 AL. No PTX on CXR this AM. Continue to suction  today. HTN - daily norvasc, PRN antihypertensives HLD GERD - pepcid Psoriasis Gout -resume home allopurinol  COPD DM2 - no longer on medication Hx stage IIIA R Lung squamous cell carcinoma s/p chemo & immunotherapy 2018 - partial response, in observation status for several years, followed by Dr. Arbutus Ped Pancytopenia - currently being worked up by hem/onc Tobacco abuse - smokes 1-2 cigarettes every 1-2 days Alcohol abuse - drinks about 1 pint brandy daily, no prior h/o alcohol withdrawal. TOC consult for cage-aid. CIWA   FEN - NS at 53mL/hr, reg diet, hyponatremia (130) - continue NS, 1g salt tabs TID, hypokalemia (3.2) - 40 mEq PO x 2 doses, Mg 0.9 - given 4 g IV mag this AM. BMP in AM VTE - SCDs only due to thrombocytopenia ID - none Foley - none Dispo - Admit to trauma service at [facility], med-surg. PT/OT, chest tube mgmt   LOS: 0 days   I reviewed nursing notes, last 24 h vitals and pain scores, last 48 h intake and output, last 24 h labs and trends, and last 24 h imaging results.  This care required moderate level of medical decision making.   Hosie Spangle, PA-C [facility] Please see Amion for pager number during day hours 7:00am-4:30pm

## 2022-10-20 ENCOUNTER — Inpatient Hospital Stay (HOSPITAL_COMMUNITY): Payer: Medicare HMO

## 2022-10-20 LAB — BASIC METABOLIC PANEL
Anion gap: 6 (ref 5–15)
BUN: 16 mg/dL (ref 8–23)
CO2: 21 mmol/L — ABNORMAL LOW (ref 22–32)
Calcium: 8 mg/dL — ABNORMAL LOW (ref 8.9–10.3)
Chloride: 99 mmol/L (ref 98–111)
Creatinine, Ser: 1.25 mg/dL — ABNORMAL HIGH (ref 0.61–1.24)
GFR, Estimated: >60 mL/min (ref >60)
Glucose, Bld: 95 mg/dL (ref 70–99)
Potassium: 3.8 mmol/L (ref 3.5–5.1)
Sodium: 126 mmol/L — ABNORMAL LOW (ref 135–145)

## 2022-10-20 LAB — PHOSPHORUS: Phosphorus: 2.2 mg/dL — ABNORMAL LOW (ref 2.5–4.6)

## 2022-10-20 LAB — MRSA NEXT GEN BY PCR, NASAL: MRSA by PCR Next Gen: NOT DETECTED

## 2022-10-20 LAB — MAGNESIUM: Magnesium: 2.1 mg/dL (ref 1.7–2.4)

## 2022-10-20 MED ORDER — METOPROLOL TARTRATE 5 MG/5ML IV SOLN
5.0000 mg | Freq: Four times a day (QID) | INTRAVENOUS | Status: DC | PRN
  Administered 2022-10-20 – 2022-10-21 (×2): 5 mg via INTRAVENOUS
  Filled 2022-10-20 (×2): qty 5

## 2022-10-20 MED ORDER — MAGNESIUM HYDROXIDE 400 MG/5ML PO SUSP
30.0000 mL | Freq: Once | ORAL | Status: DC
  Filled 2022-10-20: qty 30

## 2022-10-20 MED ORDER — POLYETHYLENE GLYCOL 3350 17 G PO PACK
17.0000 g | PACK | Freq: Every day | ORAL | Status: DC
  Administered 2022-10-20: 17 g via ORAL
  Filled 2022-10-20: qty 1

## 2022-10-20 MED ORDER — SIMETHICONE 80 MG PO CHEW
80.0000 mg | CHEWABLE_TABLET | Freq: Four times a day (QID) | ORAL | Status: DC | PRN
  Administered 2022-10-20: 80 mg via ORAL
  Filled 2022-10-20: qty 1

## 2022-10-20 MED ORDER — SODIUM CHLORIDE 1 G PO TABS
2.0000 g | ORAL_TABLET | Freq: Three times a day (TID) | ORAL | Status: DC
  Administered 2022-10-20 – 2022-10-21 (×3): 2 g via ORAL
  Filled 2022-10-20 (×3): qty 2

## 2022-10-20 MED ORDER — BISACODYL 5 MG PO TBEC: 10.0000 mg | DELAYED_RELEASE_TABLET | Freq: Once | ORAL | Status: DC

## 2022-10-20 MED ORDER — BISACODYL 10 MG RE SUPP: 10.0000 mg | Freq: Once | RECTAL | Status: DC

## 2022-10-20 MED ORDER — MAGNESIUM CITRATE PO SOLN
1.0000 | Freq: Once | ORAL | Status: DC
  Filled 2022-10-20: qty 296

## 2022-10-20 MED ORDER — SENNA 8.6 MG PO TABS
2.0000 | ORAL_TABLET | Freq: Once | ORAL | Status: AC
  Administered 2022-10-20: 17.2 mg via ORAL
  Filled 2022-10-20: qty 2

## 2022-10-20 MED ORDER — METHOCARBAMOL 500 MG PO TABS
1000.0000 mg | ORAL_TABLET | Freq: Four times a day (QID) | ORAL | Status: DC
  Administered 2022-10-20 – 2022-10-21 (×4): 1000 mg via ORAL
  Filled 2022-10-20 (×4): qty 2

## 2022-10-20 NOTE — Progress Notes (Signed)
Trauma Event Note    Reason for Call :  Chest tube removal -- per Dr. Bedelia Person order  Interventions:  Pigtail Chest tube removed without any issues  Plan of Care:  Incentive spirometry given with education- pt able to pull 1000 on first try.   Last imported Vital Signs BP (!) 150/95   Pulse 99   Temp 97.8 F (36.6 C)   Resp 20   Ht 6\' 2"  (1.88 m)   Wt 182 lb (82.6 kg)   SpO2 93%   BMI 23.37 kg/m   Trending CBC Recent Labs    10/18/22 1240 10/19/22 0430  WBC 3.2* 2.8*  HGB 12.9* 12.0*  HCT 37.7* 34.5*  PLT 89* 80*    Trending Coag's No results for input(s): "APTT", "INR" in the last 72 hours.  Trending BMET Recent Labs    10/18/22 1240 10/19/22 0430 10/20/22 0126  NA 132* 130* 126*  K 3.2* 3.2* 3.8  CL 95* 97* 99  CO2 24 23 21*  BUN 20 18 16   CREATININE 1.18 1.29* 1.25*  GLUCOSE 100* 115* 95      [name]  Trauma Response RN  Please call TRN at [phone] for further assistance.

## 2022-10-20 NOTE — TOC Initial Note (Signed)
Transition of Care [facility]) - Initial/Assessment Note    Patient Details  Name: [name] MRN: [id number] Date of Birth: 1956/06/18  [facility] CM/SW Contact:    Glennon Mac, RN Phone Number: 10/20/2022, 4:07 PM  Clinical Narrative:                 67 y.o. male presents to [facility] hospital on 10/18/2022 after a fall. Pt found to have L posterior 8-9 rib fxs with L PTX and small hemothorax. Chest placed 4/15.  PTA, pt independent and living at home alone.  He states that his first cousin and nephew can provide needed assistance at discharge.  PT/OT recommending no OP follow up.  Will continue to follow for home needs as patient progresses.  Expected Discharge Plan: Home/Self Care Barriers to Discharge: Continued Medical Work up   Patient Goals and CMS Choice Patient states their goals for this hospitalization and ongoing recovery are:: to go home          Expected Discharge Plan and Services   Discharge Planning Services: CM Consult   Living arrangements for the past 2 months: Single Family Home                                      Prior Living Arrangements/Services Living arrangements for the past 2 months: Single Family Home Lives with:: Self Patient language and need for interpreter reviewed:: Yes Do you feel safe going back to the place where you live?: Yes      Need for Family Participation in Patient Care: Yes (Comment) Care giver support system in place?: Yes (comment)   Criminal Activity/Legal Involvement Pertinent to Current Situation/Hospitalization: No - Comment as needed  Activities of Daily Living Home Assistive Devices/Equipment: None ADL Screening (condition at time of admission) Patient's cognitive ability adequate to safely complete daily activities?: Yes Is the patient deaf or have difficulty hearing?: No Does the patient have difficulty seeing, even when wearing glasses/contacts?: No Does the patient have difficulty  concentrating, remembering, or making decisions?: No Patient able to express need for assistance with ADLs?: Yes Does the patient have difficulty dressing or bathing?: No Independently performs ADLs?: Yes (appropriate for developmental age) Does the patient have difficulty walking or climbing stairs?: Yes Weakness of Legs: Both Weakness of Arms/Hands: None            Emotional Assessment   Attitude/Demeanor/Rapport: Engaged Affect (typically observed): Accepting Orientation: : Oriented to Self, Oriented to Place, Oriented to  Time, Oriented to Situation      Admission diagnosis:  Pneumothorax, left [J93.9] Fall, initial encounter [W19.XXXA] Closed fracture of multiple ribs of left side, initial encounter [S22.42XA] Pneumothorax, unspecified type [J93.9] Patient Active Problem List   Diagnosis Date Noted   Pneumothorax, left 10/18/2022   Emphysema lung 12/25/2020   Cirrhosis of liver 12/25/2020   Internal hemorrhoids 12/24/2020   Psoriasis 04/26/2018   Drug-induced skin rash 03/17/2017   Hypokalemia 02/17/2017   Encounter for antineoplastic immunotherapy 12/21/2016   Chronic fatigue 12/21/2016   Goals of care, counseling/discussion 10/09/2016   Encounter for antineoplastic chemotherapy 10/09/2016   Stage III squamous cell carcinoma of right lung 10/07/2016   Encounter for smoking cessation counseling 08/10/2016   Right lower lobe lung mass 07/26/2016   Diabetic neuropathy 05/21/2016   Tobacco use disorder 05/21/2016   Alcohol abuse 02/12/2016   Elevated liver enzymes 02/12/2016   Right  hand pain 08/13/2014   History of incisional hernia repair 06/10/2014   Osteoarthritis of hand, right 04/03/2014   ACE inhibitor-aggravated angioedema 02/02/2013   Vomiting 02/02/2013   Diarrhea 02/02/2013   Epistaxis 02/02/2013   Angioedema of lips 02/01/2013   Type 2 diabetes mellitus 12/29/2012   Essential hypertension 12/29/2012   Dyslipidemia 12/29/2012   Rectal bleeding  12/29/2012   Erectile dysfunction 12/29/2012   Gout 05/17/2011   Incarcerated ventral hernia 05/11/2011   Diabetes mellitus 05/11/2011   Hypertension 05/11/2011   PCP:  [name], MD Pharmacy:   [id number] [place], [place] - [address] AT [facility] & [address] [place] [address] Phone: [phone] Fax: [phone]     Social Determinants of Health (SDOH) Social History: SDOH Screenings   Food Insecurity: No Food Insecurity (10/19/2022)  Housing: Low Risk  (10/19/2022)  Transportation Needs: No Transportation Needs (10/19/2022)  Utilities: Not At Risk (10/19/2022)  Alcohol Screen: Low Risk  (02/23/2022)  Depression (PHQ2-9): Low Risk  (02/23/2022)  Recent Concern: Depression (PHQ2-9) - Medium Risk (12/31/2021)  Financial Resource Strain: Low Risk  (02/23/2022)  Physical Activity: Inactive (01/26/2021)  Social Connections: Moderately Integrated (01/26/2021)  Stress: No Stress Concern Present (02/23/2022)  Tobacco Use: High Risk (10/18/2022)   SDOH Interventions:     Readmission Risk Interventions     No data to display         Quintella Baton, RN, BSN  Trauma/Neuro ICU Case Manager [phone]

## 2022-10-20 NOTE — Progress Notes (Signed)
[facility] Progress Note     Subjective: CC:  Reports left chest wall pain. Tolerating PO. Per RN mobilized well this AM   Objective: Vital signs in last 24 hours: Temp:  [97.6 F (36.4 C)-98 F (36.7 C)] 97.6 F (36.4 C) (04/17 0819) Pulse Rate:  [90-118] 118 (04/17 0819) Resp:  [18-20] 19 (04/17 0819) BP: (114-151)/(77-100) 151/93 (04/17 0819) SpO2:  [92 %-97 %] 92 % (04/17 0819) Last BM Date : 10/19/22  Intake/Output from previous day: 04/16 0701 - 04/17 0700 In: 985 [P.O.:220; I.V.:657; IV Piggyback:108] Out: 815 [Urine:675; Chest Tube:140] Intake/Output this shift: No intake/output data recorded.  PE: Gen:  Alert, NAD, pleasant Card:  Regular rate and rhythm, no lower extremity edema  Pulm:  Normal effort, clear to auscultation bilaterally, chest tube without air leak Abd: Soft, non-tender, non-distended Skin: warm and dry, no rashes  Psych: A&Ox3   Lab Results:  Recent Labs    10/18/22 1240 10/19/22 0430  WBC 3.2* 2.8*  HGB 12.9* 12.0*  HCT 37.7* 34.5*  PLT 89* 80*   BMET Recent Labs    10/19/22 0430 10/20/22 0126  NA 130* 126*  K 3.2* 3.8  CL 97* 99  CO2 23 21*  GLUCOSE 115* 95  BUN 18 16  CREATININE 1.29* 1.25*  CALCIUM 8.2* 8.0*   PT/INR No results for input(s): "LABPROT", "INR" in the last 72 hours. CMP     Component Value Date/Time   NA 126 (L) 10/20/2022 0126   NA 135 07/01/2022 1152   NA 137 07/07/2017 0839   K 3.8 10/20/2022 0126   K 3.4 (L) 07/07/2017 0839   CL 99 10/20/2022 0126   CO2 21 (L) 10/20/2022 0126   CO2 21 (L) 07/07/2017 0839   GLUCOSE 95 10/20/2022 0126   GLUCOSE 154 (H) 07/07/2017 0839   BUN 16 10/20/2022 0126   BUN 16 07/01/2022 1152   BUN 17.0 07/07/2017 0839   CREATININE 1.25 (H) 10/20/2022 0126   CREATININE 1.52 (H) 10/04/2022 0923   CREATININE 1.2 07/07/2017 0839   CALCIUM 8.0 (L) 10/20/2022 0126   CALCIUM 8.9 07/07/2017 0839   PROT 7.7 10/04/2022 0923   PROT 8.5 12/31/2021 1137   PROT  8.3 07/07/2017 0839   ALBUMIN 3.6 10/04/2022 0923   ALBUMIN 4.0 12/31/2021 1137   ALBUMIN 3.5 07/07/2017 0839   AST 27 10/04/2022 0923   AST 20 07/07/2017 0839   ALT 10 10/04/2022 0923   ALT 13 07/07/2017 0839   ALKPHOS 151 (H) 10/04/2022 0923   ALKPHOS 120 07/07/2017 0839   BILITOT 0.5 10/04/2022 0923   BILITOT 0.41 07/07/2017 0839   GFRNONAA >60 10/20/2022 0126   GFRNONAA 50 (L) 10/04/2022 0923   GFRNONAA 64 02/09/2016 0949   GFRAA 58 (L) 02/16/2020 0323   GFRAA 58 (L) 11/23/2019 1256   GFRAA 74 02/09/2016 0949   Lipase     Component Value Date/Time   LIPASE 58 02/20/2011 0355       Studies/Results: DG CHEST PORT 1 VIEW  Result Date: 10/20/2022 CLINICAL DATA:  Follow up pneumothorax EXAM: PORTABLE CHEST 1 VIEW COMPARISON:  10/19/2022. FINDINGS: 8 cm masslike process overlies the right hilum. This could be pneumonia but underlying lesions not excluded on this chest x-ray. No change from the prior study. No pneumothorax identified. There may be very small left-sided pleural effusion. Left-sided chest tube appears to have been partially withdrawn and may be partially extrathoracic. IMPRESSION: Mass/consolidation overlies the right hilum. Trace left-sided pleural effusion. No  pneumothorax identified with a chest tube partially withdrawn compared to the prior study. Electronically Signed   By: Layla Maw M.D.   On: 10/20/2022 08:26   DG Chest Port 1 View  Result Date: 10/19/2022 CLINICAL DATA:  [phone] with respiratory failure and left pneumothorax with chest tube in place. EXAM: PORTABLE CHEST 1 VIEW COMPARISON:  Portable chest yesterday at 5:14 p.m. FINDINGS: 5:18 a.m. Pigtail left chest tube remains stable in positioning with the pigtail adjacent the aortic knob. There is improving chest wall emphysema alongside the tube entry. Today there is no visible pneumothorax. Mediastinal shift continues to improve. Small left pleural effusion is similar. There is stable left infrahilar  atelectasis. Stable right perihilar opacity/XRT change with radiating stranding to the chest wall and diaphragmatic interface. No new or significant further lung opacity is seen. There are COPD changes. The cardiac size is normal. Tortuous aorta with calcification, stable mediastinum.No acute osseous findings. IMPRESSION: 1. No visible pneumothorax. Improving chest wall emphysema. 2. Stable small left pleural effusion and left infrahilar atelectasis. 3. Stable right perihilar opacity/XRT change. Electronically Signed   By: Almira Bar M.D.   On: 10/19/2022 06:04   DG CHEST PORT 1 VIEW  Result Date: 10/18/2022 CLINICAL DATA:  Chest tube in place this. EXAM: PORTABLE CHEST 1 VIEW COMPARISON:  Radiograph earlier today. CT earlier today FINDINGS: Placement of pigtail catheter, pigtail portion loops centrally in the mid hemithorax. Near completely resolved left pneumothorax with questionable residual at the apex. Diminished mediastinal shift. There is new subcutaneous emphysema in the left lateral chest wall. Small amount of left pleural fluid. Unchanged right perihilar opacity. IMPRESSION: 1. Placement of pigtail catheter in the mid hemithorax. Near completely resolved left pneumothorax with questionable residual at the apex. Diminished mediastinal shift. 2. New subcutaneous emphysema in the left lateral chest wall. 3. Unchanged right perihilar opacity. Electronically Signed   By: Narda Rutherford M.D.   On: 10/18/2022 17:20   CT Chest Wo Contrast  Result Date: 10/18/2022 CLINICAL DATA:  Fall 2 days ago. Left-sided chest wall pain. History of lung carcinoma. EXAM: CT CHEST WITHOUT CONTRAST TECHNIQUE: Multidetector CT imaging of the chest was performed following the standard protocol without IV contrast. RADIATION DOSE REDUCTION: This exam was performed according to the departmental dose-optimization program which includes automated exposure control, adjustment of the mA and/or kV according to patient size  and/or use of iterative reconstruction technique. COMPARISON:  10/04/2022 and multiple older chest CTs. FINDINGS: Cardiovascular: Heart normal size. Trace pericardial effusion. Ascending thoracic aorta borderline enlarged, 3.9 cm. Main pulmonary artery enlarged, 3.7 cm. Minor aortic atherosclerotic calcifications. Mediastinum/Nodes: No neck base, mediastinal or left hilar masses. Scattered subcentimeter mediastinal and left hilar lymph nodes. Confluent soft tissue surrounds the right hilar structures with associated dystrophic calcifications, the latter also seen along the right paratracheal mediastinum. Mild irregular narrowing of the central bronchi. Trachea and esophagus are unremarkable. Lungs/Pleura: Moderate left pneumothorax. Collapse estimated at 40%. No right pneumothorax. Small left pleural effusion, mean Hounsfield units 36. No right pleural effusion. There is confluent opacity, with bronchial irregularity, Hyst ending from the right hilum into the right upper, middle and posterior lobes, stable from the prior CT, consistent with radiation therapy induced fibrosis. Mild dependent opacity no in the left lower lobe and at the diaphragmatic base consistent with atelectasis. No lung mass or suspicious nodule. No evidence of pulmonary edema or convincing pneumonia. Paraseptal and centrilobular emphysema is stable from the prior CT. Upper Abdomen: No acute findings. Musculoskeletal: Acute fractures,  without significant displacement, of the posterior left eighth and ninth ribs. No other acute fractures. No bone lesion. No chest wall mass. IMPRESSION: 1. Moderate, approximally 40%, left pneumothorax associated with nondisplaced fractures the left posterior eighth and ninth ribs. 2. Small left pleural effusion demonstrates increased Hounsfield units of 36 suspected to be a hemothorax. 3. Associated dependent atelectasis in the left lower lobe. 4. No other acute abnormality in the chest. 5. Right perihilar soft  tissue and contiguous lung opacity consistent with chronic fibrosis from treatment of lung carcinoma, unchanged from the prior CT. 6. Stable centrilobular and paraseptal emphysema. Minor aortic atherosclerosis. 7. Enlarged main pulmonary artery consistent with pulmonary artery hypertension. Aortic Atherosclerosis (ICD10-I70.0) and Emphysema (ICD10-J43.9). Electronically Signed   By: Amie Portland M.D.   On: 10/18/2022 14:10   CT Head Wo Contrast  Result Date: 10/18/2022 CLINICAL DATA:  Head trauma EXAM: CT HEAD WITHOUT CONTRAST TECHNIQUE: Contiguous axial images were obtained from the base of the skull through the vertex without intravenous contrast. RADIATION DOSE REDUCTION: This exam was performed according to the departmental dose-optimization program which includes automated exposure control, adjustment of the mA and/or kV according to patient size and/or use of iterative reconstruction technique. COMPARISON:  CT head 06/21/20 FINDINGS: Brain: No evidence of acute infarction, hemorrhage, hydrocephalus, extra-axial collection or mass lesion/mass effect. Partially empty sella. Vascular: No hyperdense vessel or unexpected calcification. Skull: Normal. Negative for fracture or focal lesion. Sinuses/Orbits: No middle ear or mastoid effusion. Paranasal sinuses are clear. Bilateral lens replacement. Orbits are otherwise unremarkable. Other: None. IMPRESSION: No CT evidence of intracranial injury. Electronically Signed   By: Lorenza Cambridge M.D.   On: 10/18/2022 14:02   DG Ribs Unilateral W/Chest Left  Result Date: 10/18/2022 CLINICAL DATA:  Fall 2 days ago with left rib pain. EXAM: LEFT RIBS AND CHEST - 3+ VIEW COMPARISON:  CT chest 10/04/2022. FINDINGS: The cardiomediastinal silhouette is stable. Consolidation in the right perihilar region with associated volume loss and architectural distortion is unchanged. There is small left hydropneumothorax without evidence of tension. There is a minimally displaced  fracture of the left posterior ninth rib. Metallic density foreign bodies project over the abdomen, unchanged. IMPRESSION: Minimally displaced fracture of the left posterior ninth rib with associated small volume left hydropneumothorax. No evidence of tension. These results were called by telephone at the time of interpretation on 10/18/2022 at 12:16 pm to provider [facility] , who verbally acknowledged these results. Electronically Signed   By: Lesia Hausen M.D.   On: 10/18/2022 12:21    Anti-infectives: Anti-infectives (From admission, onward)    None       Assessment/Plan Fall 4/13 L 8-9th rib fx's with HPTX - s/p pigtail chest tube 4/15 AL. No PTX on CXR this AM. Chest tube significantly retracted but partially in chest cavity. D/C chest tube. CXR in 4 hours HTN - daily norvasc, PRN antihypertensives HLD GERD - pepcid Psoriasis Gout -resume home allopurinol  COPD DM2 - no longer on medication Hx stage IIIA R Lung squamous cell carcinoma s/p chemo & immunotherapy 2018 - partial response, in observation status for several years, followed by Dr. Arbutus Ped Pancytopenia - currently being worked up by hem/onc Tobacco abuse - smokes 1-2 cigarettes every 1-2 days Alcohol abuse - drinks about 1 pint brandy daily, no prior h/o alcohol withdrawal. TOC consult for cage-aid. CIWA   FEN - Reg, hyponatremia 132 > 130 > 126. Increase NS to 100 mL/hr. Continue salt tabs. Hypokalemia/hypomagnesemia resolved. VTE - SCDs only  due to thrombocytopenia ID - none Foley - none Dispo - Admit to trauma service at [facility], med-surg. PT/OT, chest tube mgmt   LOS: 1 day   I reviewed nursing notes, last 24 h vitals and pain scores, last 48 h intake and output, last 24 h labs and trends, and last 24 h imaging results.  This care required moderate level of medical decision making.   Hosie Spangle, PA-C [facility] Please see Amion for pager number during day hours 7:00am-4:30pm

## 2022-10-20 NOTE — Progress Notes (Signed)
Metoprolol 5 mg iv given to for HR= 124. Will continue to monitor the pt.   Lawson Radar, RN

## 2022-10-20 NOTE — Evaluation (Signed)
Occupational Therapy Evaluation Patient Details Name: [name] MRN: [id number] DOB: 06/19/1956 Today's Date: 10/20/2022   History of Present Illness 67 y.o. male presents to [facility] hospital on 10/18/2022 after a fall. Pt found to have L posterior 8-9 rib fxs with L PTX and small hemothorax. Chest placed 4/15. PMH includes HTN, HLD, GERD, stage IIIA R lung squamous cell carcinoma.   Clinical Impression   Pt was admitted as above, presenting with deficits as listed below (please refer to OT problem list for details). Pt lives in 1 story home with 10 STE. He lives alone but states that his nephew and other family/friends can stay with him PRN. Pain is limiting factor at this time. He is currently min guard assist for functional mobility and transfers related to ADL's and Mod A for UB ADL's secondary to lines and Min A for LB ADL's. He should benefit from 3:1 at d/c. Will follow acutely to assist in maximizing independence with ADL's and functional transfers for anticipated d/c home with PRN family/friend assist.     Recommendations for follow up therapy are one component of a multi-disciplinary discharge planning process, led by the attending physician.  Recommendations may be updated based on patient status, additional functional criteria and insurance authorization.   Assistance Recommended at Discharge Intermittent Supervision/Assistance  Patient can return home with the following A little help with bathing/dressing/bathroom;Assistance with cooking/housework;Help with stairs or ramp for entrance;Assist for transportation    Functional Status Assessment  Patient has had a recent decline in their functional status and demonstrates the ability to make significant improvements in function in a reasonable and predictable amount of time.  Equipment Recommendations  BSC/3in1 (3:1 pending pt progress)    Recommendations for Other Services       Precautions / Restrictions  Precautions Precautions: Fall Precaution Comments: chest tube Restrictions Weight Bearing Restrictions: No      Mobility Bed Mobility Overal bed mobility: Needs Assistance Bed Mobility: Supine to Sit, Sit to Supine     Supine to sit: Supervision, HOB elevated Sit to supine: Supervision, HOB elevated   General bed mobility comments: line management    Transfers Overall transfer level: Needs assistance Equipment used: Rolling walker (2 wheels) Transfers: Sit to/from Stand Sit to Stand: Supervision, Min guard, From elevated surface   General transfer comment: Assist with line management      Balance Overall balance assessment: Needs assistance Sitting-balance support: No upper extremity supported, Feet supported Sitting balance-Leahy Scale: Good     Standing balance support: No upper extremity supported, During functional activity, Bilateral upper extremity supported Standing balance-Leahy Scale: Good     ADL either performed or assessed with clinical judgement   ADL Overall ADL's : Needs assistance/impaired Eating/Feeding: Set up;Bed level;Sitting   Grooming: Sitting;Set up Grooming Details (indicate cue type and reason): Sitting up at EOB Upper Body Bathing: Moderate assistance;Sitting Upper Body Bathing Details (indicate cue type and reason): Chest tube and rib fx's Lower Body Bathing: Sitting/lateral leans;Sit to/from stand;Minimal assistance;Moderate assistance   Upper Body Dressing : Moderate assistance;Sitting;Set up Upper Body Dressing Details (indicate cue type and reason): Secondary to chest tube Lower Body Dressing: Minimal assistance;Min guard;Sitting/lateral leans;Sit to/from stand;Cueing for safety Lower Body Dressing Details (indicate cue type and reason): Cues for safety secondary to chest tube Toilet Transfer: Min guard;Rolling walker (2 wheels);Ambulation;Cueing for safety;Cueing for sequencing;Squat-pivot Toilet Transfer Details (indicate cue type  and reason): Simulated with sit to stand and squat step/pivot transfer at EOB performed Toileting- Clothing Manipulation and Hygiene:  Min guard;Sitting/lateral lean;Sit to/from stand   Functional mobility during ADLs: Min guard;Cueing for sequencing;Cueing for safety;Rolling walker (2 wheels) (secondary to lines (iv, chest tube)) General ADL Comments: Pt reports that he lives alone but nephew and other family members can assist PRN at d/c per his report. He was independent ADL's, IADL's and drives prior to this admission. He was educated in role of OT and plan of care/goals. Discussed PRN assist from family/friends vs SNF rehab and pt prefers home w/o services.     Vision Baseline Vision/History: 1 Wears glasses Ability to See in Adequate Light: 0 Adequate Patient Visual Report: No change from baseline Vision Assessment?: No apparent visual deficits            Pertinent Vitals/Pain Pain Assessment Pain Assessment: 0-10 Pain Score: 9  Pain Location: ribs Pain Descriptors / Indicators: Aching Pain Intervention(s): Limited activity within patient's tolerance, Monitored during session, Repositioned, Patient requesting pain meds-RN notified     Hand Dominance Right   Extremity/Trunk Assessment Upper Extremity Assessment Upper Extremity Assessment: Overall WFL for tasks assessed   Lower Extremity Assessment Lower Extremity Assessment: Defer to PT evaluation;Overall [facility] for tasks assessed   Cervical / Trunk Assessment Cervical / Trunk Assessment: Normal   Communication Communication Communication: No difficulties   Cognition Arousal/Alertness: Awake/alert Behavior During Therapy: WFL for tasks assessed/performed Overall Cognitive Status: Within Functional Limits for tasks assessed     General Comments  VSS on RA, Sats in low 90's with functional mobility. 1 bout of HR into low 100's when pt was coughing noted            Home Living Family/patient expects to be discharged  to:: Private residence Living Arrangements: Other relatives;Alone (Nephew stays with pt at times and can assist per pt report) Available Help at Discharge: Family;Available PRN/intermittently Type of Home: House Home Access: Stairs to enter [employer] of Steps: 10 Entrance Stairs-Rails: Right Home Layout: One level     Bathroom Shower/Tub: Producer, television/film/video: Standard     Home Equipment: None      Prior Functioning/Environment Prior Level of Function : Independent/Modified Independent   Mobility Comments: enjoys gardening and working on cars ADLs Comments: Independent wit hADL's and IADL's        OT Problem List: Decreased knowledge of use of DME or AE;Decreased knowledge of precautions;Decreased activity tolerance;Pain;Impaired UE functional use (UE use impaired secondary to rib fx's and chest tube)      OT Treatment/Interventions: Self-care/ADL training;Patient/family education;Therapeutic activities;DME and/or AE instruction    OT Goals(Current goals can be found in the care plan section) Acute Rehab OT Goals Patient Stated Goal: Less pain in ribs OT Goal Formulation: With patient Time For Goal Achievement: 11/03/22 Potential to Achieve Goals: Fair  OT Frequency: Min 2X/week       AM-PAC OT "6 Clicks" Daily Activity     Outcome Measure Help from another person eating meals?: None Help from another person taking care of personal grooming?: A Little Help from another person toileting, which includes using toliet, bedpan, or urinal?: A Little Help from another person bathing (including washing, rinsing, drying)?: A Little Help from another person to put on and taking off regular upper body clothing?: A Little Help from another person to put on and taking off regular lower body clothing?: A Little 6 Click Score: 19   End of Session Equipment Utilized During Treatment: Rolling walker (2 wheels);Other (comment) (No AD) Nurse Communication:  Mobility status;Patient requests pain meds  Activity Tolerance: Patient tolerated treatment well Patient left: in bed;with call bell/phone within reach  OT Visit Diagnosis: Unsteadiness on feet (R26.81);History of falling (Z91.81);Pain Pain - part of body:  (Ribs)                Time: 4098-1191 OT Time Calculation (min): 23 min Charges:  OT General Charges $OT Visit: 1 Visit OT Evaluation $OT Eval Low Complexity: 1 Low Verma Grothaus Beth [name], OTR/L 10/20/2022, 11:38 AM

## 2022-10-20 NOTE — Progress Notes (Signed)
Pt's HR 130-140s. Pt asymptomatic. PA aware of pt's HR. Will continue to monitor the pt.  Lawson Radar, RN

## 2022-10-20 NOTE — Progress Notes (Signed)
Physical Therapy Treatment Patient Details Name: [name] MRN: [id number] DOB: 12/01/1955 Today's Date: 10/20/2022   History of Present Illness 67 y.o. male presents to [facility] hospital on 10/18/2022 after a fall. Pt found to have L posterior 8-9 rib fxs with L PTX and small hemothorax. Chest placed 4/15. PMH includes HTN, HLD, GERD, stage IIIA R lung squamous cell carcinoma.    PT Comments    Pt received in supine, agreeable to therapy session and with good participation and tolerance for transfer, gait and stair training. Pt with fair to good balance unsupported during gait trial needing only Supervision and scored 19/24 on the Dynamic Gait Index. Scores of 19 or less are predictive of falls in older community living adults, pt making good progress toward his goals. HR slightly elevated (Pt typically drinks equivalent ~8 shots per day per chart review) and SpO2 WFL on RA at end of gait trial, only mild dypsnea 1/4 DOE. Of note, pt c/o abdominal discomfort once up in chair and states he hasn't had a BM since the morning he fell (Saturday AM). Pt continues to benefit from PT services to progress toward functional mobility goals.   Recommendations for follow up therapy are one component of a multi-disciplinary discharge planning process, led by the attending physician.  Recommendations may be updated based on patient status, additional functional criteria and insurance authorization.  Follow Up Recommendations       Assistance Recommended at Discharge PRN  Patient can return home with the following A little help with bathing/dressing/bathroom;Assistance with cooking/housework;Assist for transportation;Help with stairs or ramp for entrance   Equipment Recommendations  None recommended by PT    Recommendations for Other Services       Precautions / Restrictions Precautions Precautions: Fall Precaution Comments: chest tube recently removed from L lateral chest wall Restrictions Weight  Bearing Restrictions: No     Mobility  Bed Mobility Overal bed mobility: Modified Independent Bed Mobility: Supine to Sit     Supine to sit: Modified independent (Device/Increase time), HOB elevated     General bed mobility comments: no lines to manage, no assist needed from elevated HOB posture    Transfers Overall transfer level: Needs assistance Equipment used: None, Rolling walker (2 wheels) Transfers: Sit to/from Stand Sit to Stand: Supervision           General transfer comment: supervision from EOB<>RW and with no AD>chair, min safety cues for UE placement    Ambulation/Gait Ambulation/Gait assistance: Supervision Gait Distance (Feet): 500 Feet Assistive device: None Gait Pattern/deviations: Step-through pattern, Narrow base of support       General Gait Details: see DGI; no significant LOB but narrow BOS causing minor instability with higher level balance tasks   Stairs Stairs: Yes Stairs assistance: Supervision Stair Management: Two rails, Alternating pattern, Forwards Number of Stairs: 3 General stair comments: in stairwell, no LOB, pt reports no STE but eval states 10 STE, will need to clarify; pt heavily utilizing one rail and lightly using opposite rail, no buckling or LOB   Wheelchair Mobility    Modified Rankin (Stroke Patients Only)       Balance Overall balance assessment: Needs assistance Sitting-balance support: No upper extremity supported, Feet supported Sitting balance-Leahy Scale: Good     Standing balance support: No upper extremity supported, During functional activity, Bilateral upper extremity supported Standing balance-Leahy Scale: Good Standing balance comment: see DGI                 Standardized Balance  Assessment Standardized Balance Assessment : Dynamic Gait Index   Dynamic Gait Index Level Surface: Normal Change in Gait Speed: Normal Gait with Horizontal Head Turns: Mild Impairment Gait with Vertical Head  Turns: Mild Impairment Gait and Pivot Turn: Normal Step Over Obstacle: Normal Step Around Obstacles: Mild Impairment Steps: Moderate Impairment Total Score: 19      Cognition Arousal/Alertness: Awake/alert Behavior During Therapy: WFL for tasks assessed/performed Overall Cognitive Status: Within Functional Limits for tasks assessed                                 General Comments: Pt states no stairs to enter home but eval said 10 steps, unsure of baseline cognition but pt following 1 and some 2-step commands well.        Exercises      General Comments General comments (skin integrity, edema, etc.): HR remains 120-130 bpm once reclined in chair, RN aware. SpO2 94% on RA post-exertion (pleth not working during gait trial but only 1-2/4 DOE.      Pertinent Vitals/Pain Pain Assessment Pain Assessment: 0-10 Pain Score: 7  Pain Location: ribs with certain movements/coughing Pain Descriptors / Indicators: Aching, Sharp (intermittent) Pain Intervention(s): Monitored during session, Repositioned, Patient requesting pain meds-RN notified (reviewed manual splinting with cough)    Home Living Family/patient expects to be discharged to:: Private residence Living Arrangements: Other relatives;Alone (Nephew stays with pt at times and can assist per pt report) Available Help at Discharge: Family;Available PRN/intermittently Type of Home: House Home Access: Stairs to enter Entrance Stairs-Rails: Right Entrance Stairs-Number of Steps: 10   Home Layout: One level Home Equipment: None          PT Goals (current goals can now be found in the care plan section) Acute Rehab PT Goals Patient Stated Goal: to return to independence PT Goal Formulation: With patient Time For Goal Achievement: 11/02/22 Progress towards PT goals: Progressing toward goals    Frequency    Min 3X/week      PT Plan Current plan remains appropriate       AM-PAC PT "6 Clicks" Mobility    Outcome Measure  Help needed turning from your back to your side while in a flat bed without using bedrails?: None Help needed moving from lying on your back to sitting on the side of a flat bed without using bedrails?: None Help needed moving to and from a bed to a chair (including a wheelchair)?: None Help needed standing up from a chair using your arms (e.g., wheelchair or bedside chair)?: A Little Help needed to walk in hospital room?: A Little Help needed climbing 3-5 steps with a railing? : A Little 6 Click Score: 21    End of Session Equipment Utilized During Treatment: Gait belt Activity Tolerance: Patient tolerated treatment well (pt c/o pain but this did not limit his activity during gait trial) Patient left: in chair;with call bell/phone within reach;Other (comment) (RN defers chair alarm pt has been compliant with using call bell) Nurse Communication: Mobility status;Patient requests pain meds;Other (comment) (tachycardia post-exertion) PT Visit Diagnosis: Other abnormalities of gait and mobility (R26.89);Muscle weakness (generalized) (M62.81);Pain Pain - Right/Left: Right Pain - part of body:  (L lateral chest wall)     Time: 1610-9604 PT Time Calculation (min) (ACUTE ONLY): 21 min  Charges:  $Gait Training: 8-22 mins  Florina Ou., PTA Acute Rehabilitation Services Secure Chat Preferred 9a-5:30pm Office: [phone]    Dorathy Kinsman [facility] 10/20/2022, 1:25 PM

## 2022-10-20 NOTE — Progress Notes (Addendum)
Physical Therapy Treatment Patient Details Name: [name] MRN: [id number] DOB: 1955/12/20 Today's Date: 10/20/2022   History of Present Illness 67 y.o. male presents to [facility] on 10/18/2022 after a fall. Pt found to have L posterior 8-9 rib fxs with L PTX and small hemothorax. Chest placed 4/15. PMH includes HTN, HLD, GERD, stage IIIA R lung squamous cell carcinoma.    PT Comments    PTA entered room to assist pt for safety as pt was getting up ad lib and telemetry monitor sounding due to elevated HR. Pt needing min safety cues and Supervision at most, able to navigate in room using IV pole. Pt encouraged to use call bell for OOB mobility needs for safety given IV and lines. RN notified of pt elevated HR max 143 bpm with short distance gait in room and c/o constipation related discomfort. Reviewed "falls at home handout" and supine/standing BLE HEP and encouraged him to perform standing exercises only with assist once HR improves. Pt up in recliner awaiting dinner at end of session. Pt continues to benefit from PT services to progress toward functional mobility goals.   Recommendations for follow up therapy are one component of a multi-disciplinary discharge planning process, led by the attending physician.  Recommendations may be updated based on patient status, additional functional criteria and insurance authorization.  Follow Up Recommendations       Assistance Recommended at Discharge PRN  Patient can return home with the following A little help with bathing/dressing/bathroom;Assistance with cooking/housework;Assist for transportation;Help with stairs or ramp for entrance   Equipment Recommendations  None recommended by PT    Recommendations for Other Services       Precautions / Restrictions Precautions Precautions: Fall Precaution Comments: chest tube recently removed from L lateral chest wall Restrictions Weight Bearing Restrictions: No     Mobility  Bed  Mobility Overal bed mobility: Modified Independent Bed Mobility: Supine to Sit     Supine to sit: Modified independent (Device/Increase time), HOB elevated     General bed mobility comments: pt up in chair at end of session    Transfers Overall transfer level: Needs assistance Equipment used: None (I) Transfers: Sit to/from Stand Sit to Stand: Supervision           General transfer comment: fair safety awareness to sit, assist with line mgmt    Ambulation/Gait Ambulation/Gait assistance: Supervision Gait Distance (Feet): 30 Feet Assistive device: IV Pole Gait Pattern/deviations: Step-through pattern, Narrow base of support       General Gait Details: no difficulty supported by IV pole; defer longer distance as pt HR elevated to 143 bpm with minimal exertion.   Stairs Stairs: Yes Stairs assistance: Supervision Stair Management: Two rails, Alternating pattern, Forwards Number of Stairs: 3 General stair comments: in stairwell, no LOB, pt reports no STE but eval states 10 STE, will need to clarify; pt heavily utilizing one rail and lightly using opposite rail, no buckling or LOB   Wheelchair Mobility    Modified Rankin (Stroke Patients Only)       Balance Overall balance assessment: Needs assistance Sitting-balance support: No upper extremity supported, Feet supported Sitting balance-Leahy Scale: Good     Standing balance support: No upper extremity supported, During functional activity, Bilateral upper extremity supported Standing balance-Leahy Scale: Good Standing balance comment: see DGI                 Standardized Balance Assessment Standardized Balance Assessment : Dynamic Gait Index   Dynamic Gait Index  Level Surface: Normal Change in Gait Speed: Normal Gait with Horizontal Head Turns: Mild Impairment Gait with Vertical Head Turns: Mild Impairment Gait and Pivot Turn: Normal Step Over Obstacle: Normal Step Around Obstacles: Mild  Impairment Steps: Moderate Impairment Total Score: 19      Cognition Arousal/Alertness: Awake/alert Behavior During Therapy: WFL for tasks assessed/performed Overall Cognitive Status: Within Functional Limits for tasks assessed                                 General Comments: Pt states no stairs to enter home but eval said 10 steps, unsure of baseline cognition but pt following 1 and some 2-step commands well.        Exercises Other Exercises Other Exercises: pt given standing/supine BLE exercises for strengthening and "Falls at Home" pamphlet for fall risk reduction ideas.    General Comments General comments (skin integrity, edema, etc.): HR to 143 bpm wtih short distance gait trial in the room, pt impulsively getting up unassisted due to constipation and needed min safety cues for line awareness, pt able to navigate fairly well in room areas with IV pole. Pt reports no BM, RN aware and giving meds at end of session for this.      Pertinent Vitals/Pain Pain Assessment Pain Assessment: Faces Pain Score: 7  Faces Pain Scale: Hurts even more Pain Location: intermittent in ribs and constant in abomen, pt reports no BM since admission Pain Descriptors / Indicators: Aching, Discomfort, Grimacing (intermittent) Pain Intervention(s): Limited activity within patient's tolerance, Monitored during session, Repositioned, Other (comment) (RN aware of pt c/o constipation)     PT Goals (current goals can now be found in the care plan section) Acute Rehab PT Goals Patient Stated Goal: to return to independence PT Goal Formulation: With patient Time For Goal Achievement: 11/02/22 Progress towards PT goals: Progressing toward goals    Frequency    Min 3X/week      PT Plan Current plan remains appropriate       AM-PAC PT "6 Clicks" Mobility   Outcome Measure  Help needed turning from your back to your side while in a flat bed without using bedrails?: None Help  needed moving from lying on your back to sitting on the side of a flat bed without using bedrails?: None Help needed moving to and from a bed to a chair (including a wheelchair)?: None Help needed standing up from a chair using your arms (e.g., wheelchair or bedside chair)?: A Little Help needed to walk in hospital room?: A Little Help needed climbing 3-5 steps with a railing? : A Little 6 Click Score: 21    End of Session   Activity Tolerance: Treatment limited secondary to medical complications (Comment);Patient limited by pain (elevated HR so defer additional hallway ambulation; constipation related pain per pt) Patient left: in chair;with call bell/phone within reach;Other (comment) (RN defers chair alarm, PTA offered to get him one) Nurse Communication: Mobility status;Patient requests pain meds;Other (comment) (tachycardia, constipation) PT Visit Diagnosis: Other abnormalities of gait and mobility (R26.89);Muscle weakness (generalized) (M62.81);Pain Pain - Right/Left: Right Pain - part of body:  (L lateral chest wall)     Time: 1610-9604 PT Time Calculation (min) (ACUTE ONLY): 9 min  Charges:  $Therapeutic Activity: 8-22 mins                     Chaela Branscum P., PTA Acute Rehabilitation Services Secure Chat Preferred  9a-5:30pm Office: [phone]    Dorathy Kinsman Fredricka Kohrs 10/20/2022, 5:48 PM

## 2022-10-21 ENCOUNTER — Inpatient Hospital Stay (HOSPITAL_COMMUNITY)
Admit: 2022-10-21 | Discharge: 2022-10-21 | Disposition: A | Discharge status: Home / Self Care | Payer: Medicare HMO | Attending: General Surgery | Admitting: General Surgery

## 2022-10-21 DIAGNOSIS — J939 Pneumothorax, unspecified: Secondary | ICD-10-CM

## 2022-10-21 LAB — BASIC METABOLIC PANEL
Anion gap: 8 (ref 5–15)
BUN: 13 mg/dL (ref 8–23)
CO2: 21 mmol/L — ABNORMAL LOW (ref 22–32)
Calcium: 8.1 mg/dL — ABNORMAL LOW (ref 8.9–10.3)
Chloride: 102 mmol/L (ref 98–111)
Creatinine, Ser: 1.22 mg/dL (ref 0.61–1.24)
GFR, Estimated: >60 mL/min (ref >60)
Glucose, Bld: 91 mg/dL (ref 70–99)
Potassium: 4 mmol/L (ref 3.5–5.1)
Sodium: 131 mmol/L — ABNORMAL LOW (ref 135–145)

## 2022-10-21 MED ORDER — ACETAMINOPHEN 500 MG PO TABS: 1000.0000 mg | ORAL_TABLET | Freq: Four times a day (QID) | ORAL | 0 refills | Status: DC | PRN

## 2022-10-21 MED ORDER — OXYCODONE HCL 10 MG PO TABS: 10.0000 mg | ORAL_TABLET | Freq: Four times a day (QID) | ORAL | 0 refills | Status: DC | PRN

## 2022-10-21 MED ORDER — AMLODIPINE BESYLATE 5 MG PO TABS: 5.0000 mg | ORAL_TABLET | Freq: Every day | ORAL | 0 refills | Status: DC

## 2022-10-21 MED ORDER — GUAIFENESIN 200 MG PO TABS: 200.0000 mg | ORAL_TABLET | Freq: Three times a day (TID) | ORAL | 0 refills | Status: DC | PRN

## 2022-10-21 MED ORDER — DOCUSATE SODIUM 100 MG PO CAPS: 100.0000 mg | ORAL_CAPSULE | Freq: Two times a day (BID) | ORAL | 0 refills | Status: DC

## 2022-10-21 MED ORDER — METHOCARBAMOL 1000 MG PO TABS: 1000.0000 mg | ORAL_TABLET | Freq: Four times a day (QID) | ORAL | 0 refills | Status: DC | PRN

## 2022-10-21 NOTE — Progress Notes (Signed)
Physical Therapy Treatment Patient Details Name: [name] MRN: [id number] DOB: [birth year] Today's Date: 10/21/2022   History of Present Illness 67 y.o. male presents to [facility] hospital on 10/18/2022 after a fall. Pt found to have L posterior 8-9 rib fxs with L PTX and small hemothorax. Chest placed 4/15. PMH includes HTN, HLD, GERD, stage IIIA R lung squamous cell carcinoma.    PT Comments    Pt received up in room, anxious and impulsive, requesting assist for discharge, RN clearance for session. Pt with increased confusion (dressed for DC but ride not yet arrived), however pt making good progress toward mobility goals and mostly modI for transfers, gait and only needing Supervision with min cues for stair safety using single rail. Pt without significant dyspnea and able to achieve community distance gait trial without loss of balance, with some dual tasking performed, indicating decreased fall risk compared with previous sessions. Pt's family member called his personal phone during session so pt assisted to [facility] front entrance for his safety to ensure he is able to locate his transportation, pt defers wheelchair ride and denies symptoms of pain/tachycardia or dyspnea. Pt left in care of family transporting him home by car with all belongings.   Recommendations for follow up therapy are one component of a multi-disciplinary discharge planning process, led by the attending physician.  Recommendations may be updated based on patient status, additional functional criteria and insurance authorization.  Follow Up Recommendations       Assistance Recommended at Discharge PRN  Patient can return home with the following A little help with bathing/dressing/bathroom;Assistance with cooking/housework;Assist for transportation;Help with stairs or ramp for entrance   Equipment Recommendations  None recommended by PT    Recommendations for Other Services       Precautions / Restrictions  Precautions Precautions: Fall Precaution Comments: chest tube recently removed from L lateral chest wall Restrictions Weight Bearing Restrictions: No     Mobility  Bed Mobility Overal bed mobility: Modified Independent             General bed mobility comments: up in room as PTA arrived    Transfers Overall transfer level: Needs assistance Equipment used: None Transfers: Sit to/from Stand Sit to Stand: Modified independent (Device/Increase time)           General transfer comment: fair safety awareness with transfers; impulsive    Ambulation/Gait Ambulation/Gait assistance: Modified independent (Device/Increase time) Gait Distance (Feet): 2500 Feet Assistive device: None Gait Pattern/deviations: Step-through pattern, Narrow base of support Gait velocity: functional     General Gait Details: pt without cardiac telemetry as he is preparing for DC, however no acute s/sx distress other than anxiety and impulsivity. no noted dyspnea or loss of balance.   Stairs Stairs: Yes Stairs assistance: Supervision Stair Management: One rail Right, Alternating pattern, Forwards Number of Stairs: 11 General stair comments: single rail, no LOB, reciprocal pattern   Wheelchair Mobility    Modified Rankin (Stroke Patients Only)       Balance Overall balance assessment: Needs assistance Sitting-balance support: No upper extremity supported, Feet supported Sitting balance-Leahy Scale: Good     Standing balance support: No upper extremity supported, During functional activity, Bilateral upper extremity supported Standing balance-Leahy Scale: Good                              Cognition Arousal/Alertness: Awake/alert Behavior During Therapy: Impulsive, Anxious, Restless Overall Cognitive Status: Impaired/Different from baseline Area  of Impairment: Memory, Awareness, Problem solving                     Memory: Decreased short-term memory      Awareness: Emergent Problem Solving: Requires verbal cues General Comments: Pt more anxious/confused today (hx ETOH), pt restless and impulsive, leaving his room frequently and appearing confused with status of his ride for DC. Pt agreeable to therapy session for strengthening while waiting for his ride to arrive, but remains anxious throughout. Ultimately, pt not agreeable to wait at nursing station for his ride to call, so PTA accompanying him to [facility] front entrance to ensure he is able to leave safely. Pt standing at front entrance waiting for ride, and called his ride after ~2 mins, then realized the person driving him home was parked directly in front of him. Pt more confused this date than previous date.        Exercises Other Exercises Other Exercises: pt given standing/supine BLE exercises for strengthening and "Falls at Home" pamphlet for fall risk reduction ideas.    General Comments General comments (skin integrity, edema, etc.): no acute s/sx distress; anxious, not dyspneic      Pertinent Vitals/Pain Pain Assessment Pain Assessment: No/denies pain Pain Intervention(s): Monitored during session     PT Goals (current goals can now be found in the care plan section) Acute Rehab PT Goals Patient Stated Goal: to return to independence PT Goal Formulation: With patient Time For Goal Achievement: 11/02/22 Progress towards PT goals: Progressing toward goals    Frequency    Min 3X/week      PT Plan Current plan remains appropriate       AM-PAC PT "6 Clicks" Mobility   Outcome Measure  Help needed turning from your back to your side while in a flat bed without using bedrails?: None Help needed moving from lying on your back to sitting on the side of a flat bed without using bedrails?: None Help needed moving to and from a bed to a chair (including a wheelchair)?: None Help needed standing up from a chair using your arms (e.g., wheelchair or bedside chair)?:  None Help needed to walk in hospital room?: None Help needed climbing 3-5 steps with a railing? : A Little 6 Click Score: 23    End of Session   Activity Tolerance: Patient tolerated treatment well (eager for DC but following instructions from PTA) Patient left: Other (comment) (in care of family member to transport home at front entrance [address] [facility] [address]; RN notified) Nurse Communication: Mobility status;Other (comment) (pt DC in care of family member by car, pt has DC paperwork and his belonging bag) PT Visit Diagnosis: Other abnormalities of gait and mobility (R26.89);Muscle weakness (generalized) (M62.81);Pain Pain - Right/Left: Right Pain - part of body:  (L lateral chest wall)     Time: 6962-9528 PT Time Calculation (min) (ACUTE ONLY): 19 min  Charges:  $Gait Training: 8-22 mins                     Mckinzey Entwistle P., PTA Acute Rehabilitation Services Secure Chat Preferred 9a-5:30pm Office: [phone]    Angus Palms 10/21/2022, 1:51 PM

## 2022-10-21 NOTE — Progress Notes (Signed)
Discharge instruction given. Patient verbalized understanding and all questions were answered.  

## 2022-10-21 NOTE — Discharge Summary (Signed)
[facility] Discharge Summary   Patient ID: [name] MRN: [id number] DOB/AGE: December 30, 1955 67 y.o.  Admit date: 10/18/2022 Discharge date: 10/21/2022  Admitting Diagnosis: Fall Pneumothorax Rib fractures   Discharge Diagnosis Patient Active Problem List   Diagnosis Date Noted   Pneumothorax, left 10/18/2022   Emphysema lung 12/25/2020   Cirrhosis of liver 12/25/2020   Internal hemorrhoids 12/24/2020   Psoriasis 04/26/2018   Drug-induced skin rash 03/17/2017   Hypokalemia 02/17/2017   Encounter for antineoplastic immunotherapy 12/21/2016   Chronic fatigue 12/21/2016   Goals of care, counseling/discussion 10/09/2016   Encounter for antineoplastic chemotherapy 10/09/2016   Stage III squamous cell carcinoma of right lung 10/07/2016   Encounter for smoking cessation counseling 08/10/2016   Right lower lobe lung mass 07/26/2016   Diabetic neuropathy 05/21/2016   Tobacco use disorder 05/21/2016   Alcohol abuse 02/12/2016   Elevated liver enzymes 02/12/2016   Right hand pain 08/13/2014   History of incisional hernia repair 06/10/2014   Osteoarthritis of hand, right 04/03/2014   ACE inhibitor-aggravated angioedema 02/02/2013   Vomiting 02/02/2013   Diarrhea 02/02/2013   Epistaxis 02/02/2013   Angioedema of lips 02/01/2013   Type 2 diabetes mellitus 12/29/2012   Essential hypertension 12/29/2012   Dyslipidemia 12/29/2012   Rectal bleeding 12/29/2012   Erectile dysfunction 12/29/2012   Gout 05/17/2011   Incarcerated ventral hernia 05/11/2011   Diabetes mellitus 05/11/2011   Hypertension 05/11/2011    Consultants None   Imaging: DG CHEST PORT 1 VIEW  Result Date: 10/20/2022 CLINICAL DATA:  Follow up pneumothorax EXAM: PORTABLE CHEST 1 VIEW COMPARISON:  CT 10/20/2022 at 5:21 a.m. FINDINGS: Stable masslike enlargement of the right hilum with spiculation. Persistent left basilar atelectasis. No edema or effusion. Normal cardiopericardial silhouette. There  has been interval removal of the left-sided pigtail catheter. No underlying pneumothorax. Overlapping cardiac leads. IMPRESSION: Interval removal of the left-sided pigtail catheter. No pneumothorax. Electronically Signed   By: Karen Kays M.D.   On: 10/20/2022 15:54   DG CHEST PORT 1 VIEW  Result Date: 10/20/2022 CLINICAL DATA:  Follow up pneumothorax EXAM: PORTABLE CHEST 1 VIEW COMPARISON:  10/19/2022. FINDINGS: 8 cm masslike process overlies the right hilum. This could be pneumonia but underlying lesions not excluded on this chest x-ray. No change from the prior study. No pneumothorax identified. There may be very small left-sided pleural effusion. Left-sided chest tube appears to have been partially withdrawn and may be partially extrathoracic. IMPRESSION: Mass/consolidation overlies the right hilum. Trace left-sided pleural effusion. No pneumothorax identified with a chest tube partially withdrawn compared to the prior study. Electronically Signed   By: Layla Maw M.D.   On: 10/20/2022 08:26    Procedures Dr. Bedelia Person 10/18/22 - pigtail chest tube placement, left   HPI: [name] is a 67 y.o. male with a history of HTN, HLD, GERD and stage IIIA R Lung squamous cell carcinoma s/p chemo & immunotherapy 2018 followed by Dr. Si Gaul who presented [facility] ED today after a fall.    Patient reports he tripped and fell at home on Saturday, 4/13, and hit his left ribs against a table as he went down.  He also hit his head but denies any loss of consciousness.  Only complains of left-sided rib pain with some associated shortness of breath.  He tried Tylenol for this without relief.  He denies any headache, neck pain, abdominal pain or extremity pain.  Denies fever or productive cough. States that he has not had an appetite since this  occurred, and he has not been taking his daily medications.   In the ED he was afebrile.  Initially tachycardic to 110 and Hypertensive 190's/110's. No hypoxia on  RA. CTH negative for any acute intracranial injury.  CT chest with left posterior 8-9th rib fractures with left-sided pneumothorax (moderate, ~40%) and small hemothorax. We were asked to see.  Hospital Course:  Below is a list of the patients injuries along with their management:  Fall 4/13 L 8-9th rib fx's with HPTX - s/p pigtail chest tube 4/15 AL. No PTX on CXR this AM. Chest tube significantly retracted 4/17 so it was removed. Follow up chest x-ray showed no pneumothorax. Oxygen levels > 90% ORA.  HTN - daily norvasc, PRN IV antihypertensives; was intermittently hypertensive during his admission. States he was prescribed "lasix" but stopped taking it at home due to throat swelling and lip swelling. Outpatient follow up with PCP for possible adjustment in anti-hypertensive regimen. He was also intermittently tachycardic, almost always with mobility or while in pain. EKG was normal sinus rhythm.  HLD GERD - pepcid Psoriasis Gout -resume home allopurinol  COPD DM2 - no longer on medication Hx stage IIIA R Lung squamous cell carcinoma s/p chemo & immunotherapy 2018 - partial response, in observation status for several years, followed by Dr. Arbutus Ped Pancytopenia - currently being worked up by hem/onc; hold lovenox given plts <100 Tobacco abuse - smokes 1-2 cigarettes every 1-2 days Alcohol abuse - drinks about 1 pint brandy daily, no prior h/o alcohol withdrawal. TOC consult for cage-aid. CIWA. Acute on chronic hyponatremia during his admission improve with normal saline and salt tabs.   On 10/21/22 the patients vitals were stable, pain controlled, tolerating PO, mobilizing, cleared by PT/OT and felt stable for discharge home. Follow up as below.   I have personally reviewed the patients medication history on the [place] controlled substance database.  PE: Gen:  Alert, NAD, pleasant Card:  Regular rate and rhythm, no lower extremity edema  Pulm:  Normal effort, clear to auscultation bilaterally,  interval removal of chest tube Abd: Soft, non-tender, non-distended Skin: warm and dry, no rashes  Psych: A&Ox3     Allergies as of 10/21/2022       Reactions   Ace Inhibitors Swelling, Other (See Comments)   Angioedema   Furosemide Anaphylaxis   Aspirin Other (See Comments)   "Made my nose bleed"        Medication List     STOP taking these medications    acetaZOLAMIDE 250 MG tablet Commonly known as: DIAMOX   tiZANidine 4 MG tablet Commonly known as: Zanaflex       TAKE these medications    acetaminophen 500 MG tablet Commonly known as: TYLENOL Take 2 tablets (1,000 mg total) by mouth every 6 (six) hours as needed for mild pain. What changed:  how much to take reasons to take this   allopurinol 300 MG tablet Commonly known as: ZYLOPRIM Take 1 tablet (300 mg total) by mouth daily.   Alphagan P 0.1 % Soln Generic drug: brimonidine Apply to eye.   amLODipine 5 MG tablet Commonly known as: NORVASC Take 1 tablet (5 mg total) by mouth daily.   Apremilast 30 MG Tabs Take 30 mg by mouth 2 (two) times daily.   atorvastatin 40 MG tablet Commonly known as: LIPITOR Take 1 tablet (40 mg total) by mouth daily.   Bayer Contour Next Monitor w/Device Kit Use as directed   buPROPion 150 MG 24 hr tablet Commonly  known as: WELLBUTRIN XL TAKE 1 TABLET EVERY DAY   cetirizine 10 MG tablet Commonly known as: ZYRTEC Take 1 tablet (10 mg total) by mouth daily.   clobetasol 0.05 % topical foam Commonly known as: OLUX 1 application to affected area   coal tar 0.5 % shampoo Commonly known as: NEUTROGENA T-GEL Apply 1 application topically daily.   docusate sodium 100 MG capsule Commonly known as: COLACE Take 1 capsule (100 mg total) by mouth 2 (two) times daily.   dorzolamide-timolol 2-0.5 % ophthalmic solution Commonly known as: COSOPT SMARTSIG:In Eye(s)   Fish Oil 1200 MG Caps Take 1,200 mg by mouth daily.   fluticasone 50 MCG/ACT nasal spray Commonly  known as: FLONASE Place 1 spray into both nostrils daily. What changed: how much to take   gabapentin 300 MG capsule Commonly known as: NEURONTIN Take 2 capsules (600 mg total) by mouth at bedtime.   glucose blood test strip Commonly known as: Banker Next Test Use as instructed   guaiFENesin 200 MG tablet Take 1 tablet (200 mg total) by mouth every 8 (eight) hours as needed for cough or to loosen phlegm.   hydrocortisone 25 MG suppository Commonly known as: ANUSOL-HC Place 1 suppository (25 mg total) rectally 2 (two) times daily.   hydroxypropyl methylcellulose / hypromellose 2.5 % ophthalmic solution Commonly known as: ISOPTO TEARS / GONIOVISC Place 1 drop into both eyes 3 (three) times daily as needed for dry eyes.   Methocarbamol 1000 MG Tabs Take 1,000 mg by mouth every 6 (six) hours as needed for muscle spasms.   Mitigare 0.6 MG Caps Generic drug: Colchicine TAKE 2 CAPSULES BY MOUTH AT THE START OF GOUT ATTACK, MAY TAKE 1 CAPSULE AFTERWARDS IF SYMPTOMS PERSIST   naltrexone 50 MG tablet Commonly known as: DEPADE Take 1 tablet (50 mg total) by mouth daily.   omeprazole 20 MG capsule Commonly known as: PRILOSEC TAKE 1 CAPSULE(20 MG) BY MOUTH DAILY What changed: See the new instructions.   OVER THE COUNTER MEDICATION Take 250 mg by mouth See admin instructions. Super Beta Prostate capsules- Take 250 mg by mouth once a day   Oxycodone HCl 10 MG Tabs Take 1 tablet (10 mg total) by mouth every 6 (six) hours as needed for severe pain (severe pain not relieved by tylenol, robaxin).   Pen Needles 31G X 6 MM Misc Use lantus every night at hours of sleep   thiamine 100 MG tablet Commonly known as: Vitamin B-1 Take 100 mg by mouth daily.   triamcinolone cream 0.1 % Commonly known as: KENALOG Apply 1 Application topically 2 (two) times daily.          Follow-up Information     [facility]. Call.   Why: As needed. we will call you with chest  x-ray results. Contact information: [address] [place] [address] [phone]        [facility] Follow up.   Why: Go get a chest x-ray in 2 weeks. Contact information: [address] [place] [address]        [name], MD. Schedule an appointment as soon as possible for a visit in 1 week(s).   Specialty: Family Medicine Why: for hospital follow up and follow up of high blood pressure. Contact information: [address] [place] [address] [place] [address] [phone]                 Signed: Hosie Spangle, PA-C  [facility] 10/21/2022, 8:18 AM

## 2022-10-21 NOTE — Progress Notes (Signed)
Pt is alert and fully oriented x 4, afebrile, BP stable, but HR has been > 110 or higher to 130s with ambulation shortly to the restroom. Metoprolol 5 mg IV PRN q 6 hr given. Pt has not had SOB at rest or with excertion. Lung sounds is expiratory wheezing bilaterally on auscultations. Pt is able to cough strongly with some productive yellowish sputum. Guaiphenesin 200 mg q 6 hrs given. Pt appears with no acute respiratory distress on room air with regular effort, unlabored breathing. SPO2 94-97% on room air. Left chest dressing is dry and clean after pigtail chest tube removal on day shift yesterday. His pain is well tolerated after Oxycodone, Tylenol and Robaxin given. Pt is able to rest well. We will continue to monitor.  Filiberto Pinks, RN

## 2022-10-22 ENCOUNTER — Telehealth: Payer: Self-pay

## 2022-10-22 NOTE — Transitions of Care (Post Inpatient/ED Visit) (Signed)
   10/22/2022  Name: [name] MRN: [id number] DOB: 07-15-1955  Today's TOC FU Call Status: Today's TOC FU Call Status:: Successful TOC FU Call Competed TOC FU Call Complete Date: 10/22/22  Transition Care Management Follow-up Telephone Call Date of Discharge: 10/21/22 Discharge Facility: [facility]) Type of Discharge: Inpatient Admission Primary Inpatient Discharge Diagnosis:: "fall-closed fracture of multiple ribs of left side" How have you been since you were released from the hospital?: Better (Pt states he is "still sore-area tender but taking pain med and gettng good relief." He took pain med last night prior to going to bed and was able to get several hrs of sleep. He has been walking aorund with no issues. Appetite good.) Any questions or concerns?: No  Items Reviewed: Did you receive and understand the discharge instructions provided?: Yes Medications obtained and verified?: Yes (Medications Reviewed) Any new allergies since your discharge?: No Dietary orders reviewed?: Yes Type of Diet Ordered:: low salt/heart healthy Do you have support at home?: Yes People in Home: alone, other relative(s) Name of Support/Comfort Primary Source: cousin and nephew able to assist as needed  Home Care and Equipment/Supplies: Were Home Health Services Ordered?: NA Any new equipment or medical supplies ordered?: NA  Functional Questionnaire: Do you need assistance with bathing/showering or dressing?: No Do you need assistance with meal preparation?: No Do you need assistance with eating?: No Do you have difficulty maintaining continence: No Do you need assistance with getting out of bed/getting out of a chair/moving?: No Do you have difficulty managing or taking your medications?: No  Follow up appointments reviewed: PCP Follow-up appointment confirmed?: Yes Date of PCP follow-up appointment?: 11/10/22 Follow-up Provider: [place] [facility] Follow-up  appointment confirmed?: No Reason Specialist Follow-Up Not Confirmed: Patient has Specialist Provider Number and will Call for Appointment Do you need transportation to your follow-up appointment?: No (pt states family will be able to get him to appts)   TOC Interventions Today    Flowsheet Row Most Recent Value  TOC Interventions   TOC Interventions Discussed/Reviewed TOC Interventions Discussed, Arranged PCP follow up less than 12 days/Care Guide scheduled, Post discharge activity limitations per provider, S/S of infection, Post op wound/incision care      Interventions Today    Flowsheet Row Most Recent Value  Chronic Disease   Chronic disease during today's visit Hypertension (HTN)  General Interventions   General Interventions Discussed/Reviewed General Interventions Discussed, Doctor Visits  Doctor Visits Discussed/Reviewed Doctor Visits Discussed, PCP, Specialist  PCP/Specialist Visits Compliance with follow-up visit  Education Interventions   Education Provided Provided Education  Provided Verbal Education On Nutrition, Medication, When to see the doctor, Other  [BP mgmt-pt does not have monitor in the home but voices he will go buy one and start monitoring BP]  Nutrition Interventions   Nutrition Discussed/Reviewed Nutrition Discussed, Adding fruits and vegetables, Decreasing salt  Pharmacy Interventions   Pharmacy Dicussed/Reviewed Pharmacy Topics Discussed, Medications and their functions  Safety Interventions   Safety Discussed/Reviewed Safety Discussed        Alessandra Grout [facility] Care Management Care Management Community Coordinator Direct Phone: [phone] Toll Free: [phone] Fax: [phone]

## 2022-10-23 ENCOUNTER — Other Ambulatory Visit: Payer: Self-pay | Admitting: Family Medicine

## 2022-10-23 DIAGNOSIS — R0981 Nasal congestion: Secondary | ICD-10-CM

## 2022-10-25 NOTE — Telephone Encounter (Signed)
Requested Prescriptions  Pending Prescriptions Disp Refills   fluticasone (FLONASE) 50 MCG/ACT nasal spray [Pharmacy Med Name: FLUTICASONE NASAL SP (120) RX] 16 g 1    Sig: SHAKE LIQUID AND USE 1 SPRAY IN EACH NOSTRIL DAILY     Ear, Nose, and Throat: Nasal Preparations - Corticosteroids Passed - 10/23/2022  3:52 PM      Passed - Valid encounter within last 12 months    Recent Outpatient Visits           3 months ago Chronic gout without tophus, unspecified cause, unspecified site   [facility] [place], [place], MD   9 months ago Acute bilateral low back pain without sciatica   [name] [name], MD   1 year ago Frequent urination   [facility] [place], Odette Horns, MD   1 year ago Type 2 diabetes mellitus with other specified complication, without long-term current use of insulin [facility])   [facility], Odette Horns, MD   2 years ago Type 2 diabetes mellitus with other specified complication, without long-term current use of insulin [facility])   [facility] [name], MD       Future Appointments             In 2 weeks [place], Marzella Schlein, PA-C [facility]   In 2 months [name], MD [facility]

## 2022-11-03 ENCOUNTER — Inpatient Hospital Stay (HOSPITAL_BASED_OUTPATIENT_CLINIC_OR_DEPARTMENT_OTHER): Payer: Medicare HMO | Admitting: Internal Medicine

## 2022-11-03 ENCOUNTER — Inpatient Hospital Stay: Payer: Medicare HMO | Attending: Internal Medicine

## 2022-11-03 DIAGNOSIS — C3490 Malignant neoplasm of unspecified part of unspecified bronchus or lung: Secondary | ICD-10-CM

## 2022-11-03 DIAGNOSIS — Z08 Encounter for follow-up examination after completed treatment for malignant neoplasm: Secondary | ICD-10-CM | POA: Insufficient documentation

## 2022-11-03 DIAGNOSIS — R768 Other specified abnormal immunological findings in serum: Secondary | ICD-10-CM

## 2022-11-03 DIAGNOSIS — D61818 Other pancytopenia: Secondary | ICD-10-CM

## 2022-11-03 DIAGNOSIS — Z85118 Personal history of other malignant neoplasm of bronchus and lung: Secondary | ICD-10-CM | POA: Diagnosis not present

## 2022-11-03 LAB — CBC WITH DIFFERENTIAL (CANCER CENTER ONLY)
Abs Immature Granulocytes: 0.02 10*3/uL (ref 0.00–0.07)
Basophils Absolute: 0 10*3/uL (ref 0.0–0.1)
Basophils Relative: 1 %
Eosinophils Absolute: 0.2 10*3/uL (ref 0.0–0.5)
Eosinophils Relative: 6 %
HCT: 34.4 % — ABNORMAL LOW (ref 39.0–52.0)
Hemoglobin: 12.2 g/dL — ABNORMAL LOW (ref 13.0–17.0)
Immature Granulocytes: 1 %
Lymphocytes Relative: 34 %
Lymphs Abs: 0.9 10*3/uL (ref 0.7–4.0)
MCH: 31.9 pg (ref 26.0–34.0)
MCHC: 35.5 g/dL (ref 30.0–36.0)
MCV: 90.1 fL (ref 80.0–100.0)
Monocytes Absolute: 0.3 10*3/uL (ref 0.1–1.0)
Monocytes Relative: 13 %
Neutro Abs: 1.1 10*3/uL — ABNORMAL LOW (ref 1.7–7.7)
Neutrophils Relative %: 45 %
Platelet Count: 134 10*3/uL — ABNORMAL LOW (ref 150–400)
RBC: 3.82 MIL/uL — ABNORMAL LOW (ref 4.22–5.81)
RDW: 17.3 % — ABNORMAL HIGH (ref 11.5–15.5)
Smear Review: NORMAL
WBC Count: 2.5 10*3/uL — ABNORMAL LOW (ref 4.0–10.5)
nRBC: 0 % (ref 0.0–0.2)
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
## 2022-11-08 NOTE — Progress Notes (Unsigned)
Patient ID: [name], male   DOB: Feb 23, 1956, 68 y.o.   MRN: [id number] Admit date: 10/18/2022 Discharge date: 10/21/2022   Admitting Diagnosis: Fall Pneumothorax Rib fractures  HPI: [name] is a 67 y.o. male with a history of HTN, HLD, GERD and stage IIIA R Lung squamous cell carcinoma s/p chemo & immunotherapy 2018 followed by Dr. Si Gaul who presented [facility] ED today after a fall.    Patient reports he tripped and fell at home on Saturday, 4/13, and hit his left ribs against a table as he went down.  He also hit his head but denies any loss of consciousness.  Only complains of left-sided rib pain with some associated shortness of breath.  He tried Tylenol for this without relief.  He denies any headache, neck pain, abdominal pain or extremity pain.  Denies fever or productive cough. States that he has not had an appetite since this occurred, and he has not been taking his daily medications.   In the ED he was afebrile.  Initially tachycardic to 110 and Hypertensive 190's/110's. No hypoxia on RA. CTH negative for any acute intracranial injury.  CT chest with left posterior 8-9th rib fractures with left-sided pneumothorax (moderate, ~40%) and small hemothorax. We were asked to see.  Hospital Course:  Below is a list of the patients injuries along with their management:  Fall 4/13 L 8-9th rib fx's with HPTX - s/p pigtail chest tube 4/15 AL. No PTX on CXR this AM. Chest tube significantly retracted 4/17 so it was removed. Follow up chest x-ray showed no pneumothorax. Oxygen levels > 90% ORA.  HTN - daily norvasc, PRN IV antihypertensives; was intermittently hypertensive during his admission. States he was prescribed "lasix" but stopped taking it at home due to throat swelling and lip swelling. Outpatient follow up with PCP for possible adjustment in anti-hypertensive regimen. He was also intermittently tachycardic, almost always with mobility or while in pain. EKG was normal sinus  rhythm.  HLD GERD - pepcid Psoriasis Gout -resume home allopurinol  COPD DM2 - no longer on medication Hx stage IIIA R Lung squamous cell carcinoma s/p chemo & immunotherapy 2018 - partial response, in observation status for several years, followed by Dr. Arbutus Ped Pancytopenia - currently being worked up by hem/onc; hold lovenox given plts <100 Tobacco abuse - smokes 1-2 cigarettes every 1-2 days Alcohol abuse - drinks about 1 pint brandy daily, no prior h/o alcohol withdrawal. TOC consult for cage-aid. CIWA. Acute on chronic hyponatremia during his admission improve with normal saline and salt tabs.    On 10/21/22 the patients vitals were stable, pain controlled, tolerating PO, mobilizing, cleared by PT/OT and felt stable for discharge home. Follow up as below.

## 2022-11-10 ENCOUNTER — Encounter: Payer: Self-pay | Admitting: Physician Assistant

## 2022-11-10 ENCOUNTER — Ambulatory Visit: Payer: Medicare HMO | Attending: Physician Assistant | Admitting: Physician Assistant

## 2022-11-10 VITALS — BP 138/78 | HR 82 | Wt 183.6 lb

## 2022-11-10 DIAGNOSIS — Z09 Encounter for follow-up examination after completed treatment for conditions other than malignant neoplasm: Secondary | ICD-10-CM

## 2022-11-10 DIAGNOSIS — J948 Other specified pleural conditions: Secondary | ICD-10-CM | POA: Diagnosis not present

## 2022-11-10 DIAGNOSIS — E871 Hypo-osmolality and hyponatremia: Secondary | ICD-10-CM

## 2022-11-10 DIAGNOSIS — F1010 Alcohol abuse, uncomplicated: Secondary | ICD-10-CM | POA: Diagnosis not present

## 2022-11-10 DIAGNOSIS — S2232XD Fracture of one rib, left side, subsequent encounter for fracture with routine healing: Secondary | ICD-10-CM | POA: Diagnosis not present

## 2022-11-10 DIAGNOSIS — I10 Essential (primary) hypertension: Secondary | ICD-10-CM | POA: Diagnosis not present

## 2022-11-10 MED ORDER — AMLODIPINE BESYLATE 5 MG PO TABS: 5.0000 mg | ORAL_TABLET | Freq: Every day | ORAL | 1 refills | Status: DC

## 2022-11-10 NOTE — Patient Instructions (Signed)
[email or web address] is the website for narcotics anonymous Nc23.org (website) or 336-854-4278 is the information for alcoholics anonymous Both are free and immediately available for help with alcohol and drug use  

## 2022-11-11 LAB — COMPREHENSIVE METABOLIC PANEL
ALT: 16 IU/L (ref 0–44)
AST: 24 IU/L (ref 0–40)
Albumin/Globulin Ratio: 1 — ABNORMAL LOW (ref 1.2–2.2)
Albumin: 3.9 g/dL (ref 3.9–4.9)
Alkaline Phosphatase: 198 IU/L — ABNORMAL HIGH (ref 44–121)
BUN/Creatinine Ratio: 16 (ref 10–24)
BUN: 19 mg/dL (ref 8–27)
Bilirubin Total: 0.6 mg/dL (ref 0.0–1.2)
CO2: 22 mmol/L (ref 20–29)
Calcium: 9.4 mg/dL (ref 8.6–10.2)
Chloride: 97 mmol/L (ref 96–106)
Creatinine, Ser: 1.19 mg/dL (ref 0.76–1.27)
Globulin, Total: 3.9 g/dL (ref 1.5–4.5)
Glucose: 81 mg/dL (ref 70–99)
Potassium: 4.2 mmol/L (ref 3.5–5.2)
Sodium: 135 mmol/L (ref 134–144)
Total Protein: 7.8 g/dL (ref 6.0–8.5)
eGFR: 67 mL/min/{1.73_m2} (ref >59)

## 2022-11-11 LAB — CBC WITH DIFFERENTIAL/PLATELET
Basophils Absolute: 0 10*3/uL (ref 0.0–0.2)
Basos: 1 %
EOS (ABSOLUTE): 0.2 10*3/uL (ref 0.0–0.4)
Eos: 7 %
Hematocrit: 38 % (ref 37.5–51.0)
Hemoglobin: 12.5 g/dL — ABNORMAL LOW (ref 13.0–17.7)
Immature Grans (Abs): 0 10*3/uL (ref 0.0–0.1)
Immature Granulocytes: 0 %
Lymphocytes Absolute: 0.9 10*3/uL (ref 0.7–3.1)
Lymphs: 31 %
MCH: 29.6 pg (ref 26.6–33.0)
MCHC: 32.9 g/dL (ref 31.5–35.7)
MCV: 90 fL (ref 79–97)
Monocytes Absolute: 0.3 10*3/uL (ref 0.1–0.9)
Monocytes: 11 %
Neutrophils Absolute: 1.4 10*3/uL (ref 1.4–7.0)
Neutrophils: 50 %
Platelets: 164 10*3/uL (ref 150–450)
RBC: 4.22 x10E6/uL (ref 4.14–5.80)
RDW: 16 % — ABNORMAL HIGH (ref 11.6–15.4)
WBC: 2.8 10*3/uL — ABNORMAL LOW (ref 3.4–10.8)

## 2022-11-15 ENCOUNTER — Telehealth: Payer: Self-pay | Admitting: *Deleted

## 2022-11-15 NOTE — Telephone Encounter (Signed)
Pt name and DOB verified. Patient aware of results and result note per Georgian Co, PA-C States he will try to stop drinking, drinks 1/5 daily. Advised to take BP and notify office of SBP readings greater than 160. Advised of sx's that will warrant ED visit. Patient verbalized understanding.

## 2022-11-15 NOTE — Telephone Encounter (Signed)
-----   Message from Anders Simmonds, [place] sent at 11/11/2022  8:32 AM EDT ----- Please call patient and let him know his hemoglobin is stable.  Kidney and liver function are normal.  Sodium is back to normal.  Alkaline phosphatase is elevated which can indicate a problem in the bones or liver.  Avoid drinking alcohol and we will recheck this at your appt in July.  Thanks, Georgian Co, PA-C

## 2022-11-24 ENCOUNTER — Ambulatory Visit: Payer: Medicare HMO | Attending: Family Medicine | Admitting: Pharmacist

## 2022-11-24 DIAGNOSIS — I700 Atherosclerosis of aorta: Secondary | ICD-10-CM

## 2022-11-24 DIAGNOSIS — E1169 Type 2 diabetes mellitus with other specified complication: Secondary | ICD-10-CM

## 2022-11-24 DIAGNOSIS — E785 Hyperlipidemia, unspecified: Secondary | ICD-10-CM

## 2022-11-24 DIAGNOSIS — Z79899 Other long term (current) drug therapy: Secondary | ICD-10-CM

## 2022-11-24 MED ORDER — ATORVASTATIN CALCIUM 40 MG PO TABS
40.0000 mg | ORAL_TABLET | Freq: Every day | ORAL | 2 refills | Status: DC
Start: 2022-11-24 — End: 2024-01-26

## 2022-11-24 NOTE — Progress Notes (Signed)
11/24/2022 Name: [name] MRN: [id number] DOB: [birth year]  No chief complaint on file.   [name] is a 67 y.o. year old male who presented for a telephone visit.   They were referred to the pharmacist by a quality report for assistance in managing medication access. Per [facility], he failed his MAC or adherence to his cholesterol medication metric last year.   Patient is participating in a Managed Medicaid Plan: No  Subjective:  Care Team: Primary Care Provider: [name], MD ; Next Scheduled Visit: 01/2023  Medication Access/Adherence  Current Pharmacy:  [id number] [place], [place] - [address] AT [facility] & [address] [place] [address] Phone: [phone] Fax: [phone]   Patient reports affordability concerns with their medications: No  Patient reports access/transportation concerns to their pharmacy: No  Patient reports adherence concerns with their medications:  No     Medication Management:  Current adherence strategy: sufficient. Would like to continue getting 90-day supplies at his [facility].   Patient reports Fair adherence to medications  Patient reports the following barriers to adherence: none reported    Objective:  Lab Results  Component Value Date   HGBA1C 4.7 (L) 07/01/2022    Lab Results  Component Value Date   CREATININE 1.19 11/10/2022   BUN 19 11/10/2022   NA 135 11/10/2022   K 4.2 11/10/2022   CL 97 11/10/2022   CO2 22 11/10/2022    Lab Results  Component Value Date   CHOL 128 12/31/2021   HDL 62 12/31/2021   LDLCALC 43 12/31/2021   TRIG 131 12/31/2021   CHOLHDL 4.2 12/25/2020    Medications Reviewed Today     Reviewed by Bary Richard (Physician Assistant) on 11/10/22 at [id number]  Med List Status: <None>   Medication Order Taking? Sig Documenting Provider Last Dose Status Informant  acetaminophen (TYLENOL) 500 MG tablet [id number] Yes  Take 2 tablets (1,000 mg total) by mouth every 6 (six) hours as needed for mild pain. [name], PA-C Taking Active   allopurinol (ZYLOPRIM) 300 MG tablet [id number] Yes Take 1 tablet (300 mg total) by mouth daily. [name], MD Taking Active            Med Note [name], Melody Comas Oct 20, 2022  3:11 PM) PER [facility], THIS HAS NOT BEEN FILLED SINCE DECEMBER, 2022  ALPHAGAN P 0.1 % SOLN [id number] Yes Apply to eye. [provider] Taking Active            Med Note [name], [name]   Wed Oct 20, 2022  3:12 PM) PER [facility], THIS HAS NOT BEEN FILLED IN 24+ MONTHS  amLODipine (NORVASC) 5 MG tablet [id number]  Take 1 tablet (5 mg total) by mouth daily. Bary Richard  Active   Apremilast 30 MG TABS [id number] Yes Take 30 mg by mouth 2 (two) times daily. [provider] Taking Active            Med Note [name], [name]   Wed Oct 20, 2022  3:13 PM) PER [facility], THIS HAS NOT BEEN FILLED IN 24+ MONTHS  atorvastatin (LIPITOR) 40 MG tablet [id number] Yes Take 1 tablet (40 mg total) by mouth daily. [name], MD Taking Active Self           Med Note [name], [name]   Wed Oct 20, 2022  3:13 PM) LF #90 in  05/2022  Blood Glucose Monitoring Suppl (BAYER CONTOUR NEXT MONITOR) w/Device KIT [id number] Yes Use as directed Quentin Angst, MD Taking Active   buPROPion (WELLBUTRIN XL) 150 MG 24 hr tablet [id number] Yes TAKE 1 TABLET EVERY DAY [name], MD Taking Active            Med Note [name], KATHY N   Wed Oct 20, 2022  3:17 PM) PER [facility], THIS HAS NOT BEEN FILLED IN 24+ MONTHS  cetirizine (ZYRTEC) 10 MG tablet [id number] Yes Take 1 tablet (10 mg total) by mouth daily. [name], MD Taking Active Self  clobetasol (OLUX) 0.05 % topical foam [id number] Yes 1 application to affected area [provider] Taking Active   coal tar (NEUTROGENA T-GEL) 0.5 % shampoo [id number] Yes Apply 1 application topically daily. [provider] Taking  Active   docusate sodium (COLACE) 100 MG capsule [id number] Yes Take 1 capsule (100 mg total) by mouth 2 (two) times daily. [name], PA-C Taking Active   dorzolamide-timolol (COSOPT) 22.3-6.8 MG/ML ophthalmic solution [id number] Yes SMARTSIG:In Eye(s) [provider] Taking Active            Med Note [name], [name]   Wed Oct 20, 2022  3:17 PM) PER [facility], THIS HAS NOT BEEN FILLED IN 24+ MONTHS  fluticasone (FLONASE) 50 MCG/ACT nasal spray [id number] Yes SHAKE LIQUID AND USE 1 SPRAY IN EACH NOSTRIL DAILY Newlin, Enobong, MD Taking Active   gabapentin (NEURONTIN) 300 MG capsule [id number] Yes Take 2 capsules (600 mg total) by mouth at bedtime. [name], MD Taking Active Self           Med Note [name], Melody Comas Oct 20, 2022  3:14 PM) PER [facility], THIS HAS NOT BEEN FILLED SINCE 12/2021  glucose blood (BAYER CONTOUR NEXT TEST) test strip [id number] Yes Use as instructed Quentin Angst, MD Taking Active   guaiFENesin 200 MG tablet [id number] Yes Take 1 tablet (200 mg total) by mouth every 8 (eight) hours as needed for cough or to loosen phlegm. [name], PA-C Taking Active   hydrocortisone (ANUSOL-HC) 25 MG suppository [id number] Yes Place 1 suppository (25 mg total) rectally 2 (two) times daily. [name], PA-C Taking Active            Med Note [name], [name]   Wed Oct 20, 2022  3:14 PM) PER [facility], THIS HAS NOT BEEN FILLED IN 24+ MONTHS  hydroxypropyl methylcellulose / hypromellose (ISOPTO TEARS / GONIOVISC) 2.5 % ophthalmic solution [id number] Yes Place 1 drop into both eyes 3 (three) times daily as needed for dry eyes. [provider] Taking Active   Insulin Pen Needle (PEN NEEDLES) 31G X 6 MM MISC [id number] Yes Use lantus every night at hours of sleep [name], NP Taking Active   methocarbamol 1000 MG TABS [id number] Yes Take 1,000 mg by mouth every 6 (six) hours as needed for muscle spasms. [name] Taking  Active   MITIGARE 0.6 MG CAPS [id number] Yes TAKE 2 CAPSULES BY MOUTH AT THE START OF GOUT ATTACK, MAY TAKE 1 CAPSULE AFTERWARDS IF SYMPTOMS PERSIST [name], MD Taking Active            Med Note [name], KATHY N   Wed Oct 20, 2022  3:14 PM) PER [facility], THIS HAS NOT BEEN FILLED IN 24+ MONTHS  naltrexone (DEPADE) 50 MG tablet [id number] Yes Take 1 tablet (50 mg total) by mouth daily. [name], MD  Taking Active            Med Note [name], [name]   Wed Oct 20, 2022  3:15 PM) PER [facility], THIS HAS NOT BEEN FILLED SINCE 10/2021  Omega-3 Fatty Acids (FISH OIL) 1200 MG CAPS [id number] Yes Take 1,200 mg by mouth daily.  [provider] Taking Active   omeprazole (PRILOSEC) 20 MG capsule [id number] Yes TAKE 1 CAPSULE(20 MG) BY MOUTH DAILY  Patient taking differently: Take 20 mg by mouth 3 (three) times a week.   [name], MD Taking Active Self           Med Note [name], Melody Comas Oct 20, 2022  3:15 PM) PER [facility], THIS HAS NOT BEEN FILLED SINCE 03/2022  OVER THE COUNTER MEDICATION [id number] Yes Take 250 mg by mouth See admin instructions. Super Beta Prostate capsules- Take 250 mg by mouth once a day [provider] Taking Active Self  oxyCODONE 10 MG TABS [id number] Yes Take 1 tablet (10 mg total) by mouth every 6 (six) hours as needed for severe pain (severe pain not relieved by tylenol, robaxin). [name], PA-C Taking Active   thiamine (VITAMIN B-1) 100 MG tablet [id number] Yes Take 100 mg by mouth daily. [provider] Taking Active Self  triamcinolone cream (KENALOG) 0.1 % [id number] Yes Apply 1 Application topically 2 (two) times daily. [name], MD Taking Active               Assessment/Plan:   Medication Management: - Currently strategy sufficient to maintain appropriate adherence to prescribed medication regimen - Suggested use of weekly pill box to organize medications - Created list of medication, indication, and  administration time. Provided to patient    Follow Up Plan: PCP in July  Butch Penny, PharmD, [place], CPP Clinical Pharmacist [facility] [phone]

## 2022-11-25 ENCOUNTER — Other Ambulatory Visit: Payer: Self-pay | Admitting: Family Medicine

## 2022-11-25 DIAGNOSIS — R0981 Nasal congestion: Secondary | ICD-10-CM

## 2022-11-25 MED ORDER — AMLODIPINE BESYLATE 5 MG PO TABS: 5.0000 mg | ORAL_TABLET | Freq: Every day | ORAL | 1 refills | Status: DC

## 2022-11-25 MED ORDER — FLUTICASONE PROPIONATE 50 MCG/ACT NA SUSP
NASAL | 1 refills | Status: DC
Start: 2022-11-25 — End: 2023-01-06

## 2022-11-25 MED ORDER — METHOCARBAMOL 1000 MG PO TABS: 1000.0000 mg | ORAL_TABLET | Freq: Four times a day (QID) | ORAL | 0 refills | Status: DC | PRN

## 2022-11-25 NOTE — Telephone Encounter (Signed)
Sending to mail order pharmacy d/t pt request.   Requested Prescriptions  Pending Prescriptions Disp Refills   amLODipine (NORVASC) 5 MG tablet 90 tablet 1    Sig: Take 1 tablet (5 mg total) by mouth daily.     Cardiovascular: Calcium Channel Blockers 2 Passed - 11/25/2022 10:14 AM      Passed - Last BP in normal range    BP Readings from Last 1 Encounters:  11/10/22 138/78         Passed - Last Heart Rate in normal range    Pulse Readings from Last 1 Encounters:  11/10/22 82         Passed - Valid encounter within last 6 months    Recent Outpatient Visits           Yesterday Medication management   [facility] [place], [place] L, RPH-CPP   2 weeks ago Closed fracture of one rib of left side with routine healing, subsequent encounter   [facility] [place], [place], [place]   4 months ago Chronic gout without tophus, unspecified cause, unspecified site   [facility] [place], [place] Bird, MD   10 months ago Acute bilateral low back pain without sciatica   [facility] [name], MD   1 year ago Frequent urination   [place] [facility] [facility] [name], MD       Future Appointments             In 1 month [name], MD [facility]             Methocarbamol 1000 MG TABS 30 tablet 0    Sig: Take 1,000 mg by mouth every 6 (six) hours as needed.     Not Delegated - Analgesics:  Muscle Relaxants Failed - 11/25/2022 10:14 AM      Failed - This refill cannot be delegated      Passed - Valid encounter within last 6 months    Recent Outpatient Visits           Yesterday Medication management   [facility] [place], [place] L, RPH-CPP   2 weeks ago Closed fracture of one rib of left side with routine healing, subsequent encounter    [facility] [place], [place], [place]   4 months ago Chronic gout without tophus, unspecified cause, unspecified site   [facility] [place], Odette Horns, MD   10 months ago Acute bilateral low back pain without sciatica   [facility] [name], MD   1 year ago Frequent urination   [place] [facility] [name], MD       Future Appointments             In 1 month Alvis Lemmings, Odette Horns, MD  [facility]             fluticasone [facility]) 50 MCG/ACT nasal spray 16 g 1    Sig: SHAKE LIQUID AND USE 1 SPRAY IN EACH NOSTRIL DAILY     Ear, Nose, and Throat: Nasal Preparations - Corticosteroids Passed - 11/25/2022 10:14 AM      Passed - Valid encounter within last 12 months    Recent Outpatient Visits  Yesterday Medication management   [facility] [place], Cornelius Moras, RPH-CPP   2 weeks ago Closed fracture of one rib of left side with routine healing, subsequent encounter   [facility] [place], [place], [place]   4 months ago Chronic gout without tophus, unspecified cause, unspecified site   [facility] [place], Odette Horns, MD   10 months ago Acute bilateral low back pain without sciatica   [facility] [name], MD   1 year ago Frequent urination    [facility] [name], MD       Future Appointments             In 1 month [name], MD [facility]

## 2022-11-25 NOTE — Telephone Encounter (Signed)
Requested medication (s) are due for refill today: review  Requested medication (s) are on the active medication list: yes  Last refill:  10/21/22 #30/0  Future visit scheduled: yes  Notes to clinic:  Unable to refill per protocol, last refill by another provider.      Requested Prescriptions  Pending Prescriptions Disp Refills   Methocarbamol 1000 MG TABS 30 tablet 0    Sig: Take 1,000 mg by mouth every 6 (six) hours as needed.     Not Delegated - Analgesics:  Muscle Relaxants Failed - 11/25/2022 10:14 AM      Failed - This refill cannot be delegated      Passed - Valid encounter within last 6 months    Recent Outpatient Visits           Yesterday Medication management   [facility] [place], [place] L, RPH-CPP   2 weeks ago Closed fracture of one rib of left side with routine healing, subsequent encounter   [facility] [place], [place], [place]   4 months ago Chronic gout without tophus, unspecified cause, unspecified site   [facility] [place], Odette Horns, MD   10 months ago Acute bilateral low back pain without sciatica   [facility] [name], MD   1 year ago Frequent urination   [facility] [name], MD       Future Appointments             In 1 month [name], MD [place] [facility]            Signed Prescriptions Disp Refills   amLODipine (NORVASC) 5 MG tablet 90 tablet 1    Sig: Take 1 tablet (5 mg total) by mouth daily.     Cardiovascular: Calcium Channel Blockers 2 Passed - 11/25/2022 10:14 AM      Passed - Last BP in normal range    BP Readings from Last 1 Encounters:  11/10/22 138/78         Passed - Last Heart Rate in normal range    Pulse Readings from Last 1 Encounters:  11/10/22 82         Passed - Valid encounter within  last 6 months    Recent Outpatient Visits           Yesterday Medication management   [facility] [place], [place] L, RPH-CPP   2 weeks ago Closed fracture of one rib of left side with routine healing, subsequent encounter   [facility] [place], [place], [place]   4 months ago Chronic gout without tophus, unspecified cause, unspecified site   [facility] [place], Odette Horns, MD   10 months ago Acute bilateral low back pain without sciatica   [facility] [name], MD   1 year ago Frequent urination   [facility] [name], MD       Future Appointments             In 1 month [name], MD [facility]             fluticasone [facility]) 50 MCG/ACT nasal spray 16 g 1    Sig: SHAKE  LIQUID AND USE 1 SPRAY IN EACH NOSTRIL DAILY     Ear, Nose, and Throat: Nasal Preparations - Corticosteroids Passed - 11/25/2022 10:14 AM      Passed - Valid encounter within last 12 months    Recent Outpatient Visits           Yesterday Medication management   [facility] [place], [place] L, RPH-CPP   2 weeks ago Closed fracture of one rib of left side with routine healing, subsequent encounter   [facility] [place], [place], [place]   4 months ago Chronic gout without tophus, unspecified cause, unspecified site   [facility] [place], Odette Horns, MD   10 months ago Acute bilateral low back pain without sciatica   [facility] [name], MD   1 year ago Frequent urination    [facility] [name], MD       Future Appointments             In 1 month [name], MD [facility] & [facility]

## 2022-11-25 NOTE — Telephone Encounter (Signed)
Medication Refill - Medication: amlodipine 5mg /methocarbamol 1000 MG TABS /fluticasone (FLONASE) 50 MCG/ACT nasal spray  Has the patient contacted their pharmacy? yes (Agent: If no, request that the patient contact the pharmacy for the refill. If patient does not wish to contact the pharmacy document the reason why and proceed with request.) (Agent: If yes, when and what did the pharmacy advise?)pharmacy called in directly  Preferred Pharmacy (with phone number or street name):  [facility] - [place], [place] - [address] Phone: [phone]  Fax: [phone]     Has the patient been seen for an appointment in the last year OR does the patient have an upcoming appointment? yes  Agent: Please be advised that RX refills may take up to 3 business days. We ask that you follow-up with your pharmacy.

## 2022-12-03 ENCOUNTER — Other Ambulatory Visit: Payer: Self-pay | Admitting: Family Medicine

## 2022-12-03 MED ORDER — METHOCARBAMOL 750 MG PO TABS: 750.0000 mg | ORAL_TABLET | Freq: Three times a day (TID) | ORAL | 1 refills | Status: DC | PRN

## 2022-12-20 DIAGNOSIS — H2701 Aphakia, right eye: Secondary | ICD-10-CM | POA: Diagnosis not present

## 2022-12-20 DIAGNOSIS — H4051X3 Glaucoma secondary to other eye disorders, right eye, severe stage: Secondary | ICD-10-CM | POA: Diagnosis not present

## 2022-12-20 DIAGNOSIS — H59811 Chorioretinal scars after surgery for detachment, right eye: Secondary | ICD-10-CM | POA: Diagnosis not present

## 2022-12-20 DIAGNOSIS — H53411 Scotoma involving central area, right eye: Secondary | ICD-10-CM | POA: Diagnosis not present

## 2022-12-20 DIAGNOSIS — H338 Other retinal detachments: Secondary | ICD-10-CM | POA: Diagnosis not present

## 2022-12-20 DIAGNOSIS — H31011 Macula scars of posterior pole (postinflammatory) (post-traumatic), right eye: Secondary | ICD-10-CM | POA: Diagnosis not present

## 2023-01-03 ENCOUNTER — Ambulatory Visit: Payer: Medicare HMO | Admitting: Family Medicine

## 2023-01-04 ENCOUNTER — Emergency Department (HOSPITAL_COMMUNITY): Payer: Medicare HMO

## 2023-01-04 ENCOUNTER — Ambulatory Visit: Payer: Self-pay

## 2023-01-04 ENCOUNTER — Inpatient Hospital Stay (HOSPITAL_COMMUNITY)
Admission: EM | Admit: 2023-01-04 | Discharge: 2023-01-06 | DRG: 683 | Disposition: A | Discharge status: Home / Self Care | Payer: Medicare HMO | Source: Ambulatory Visit | Attending: Internal Medicine | Admitting: Internal Medicine

## 2023-01-04 ENCOUNTER — Other Ambulatory Visit: Payer: Self-pay

## 2023-01-04 DIAGNOSIS — Z820 Family history of epilepsy and other diseases of the nervous system: Secondary | ICD-10-CM

## 2023-01-04 DIAGNOSIS — E860 Dehydration: Secondary | ICD-10-CM | POA: Diagnosis present

## 2023-01-04 DIAGNOSIS — D61818 Other pancytopenia: Secondary | ICD-10-CM | POA: Insufficient documentation

## 2023-01-04 DIAGNOSIS — F1020 Alcohol dependence, uncomplicated: Secondary | ICD-10-CM | POA: Diagnosis present

## 2023-01-04 DIAGNOSIS — Z79899 Other long term (current) drug therapy: Secondary | ICD-10-CM | POA: Diagnosis not present

## 2023-01-04 DIAGNOSIS — Z85118 Personal history of other malignant neoplasm of bronchus and lung: Secondary | ICD-10-CM

## 2023-01-04 DIAGNOSIS — R7989 Other specified abnormal findings of blood chemistry: Secondary | ICD-10-CM | POA: Diagnosis not present

## 2023-01-04 DIAGNOSIS — E119 Type 2 diabetes mellitus without complications: Secondary | ICD-10-CM | POA: Diagnosis present

## 2023-01-04 DIAGNOSIS — F17210 Nicotine dependence, cigarettes, uncomplicated: Secondary | ICD-10-CM | POA: Diagnosis present

## 2023-01-04 DIAGNOSIS — E876 Hypokalemia: Secondary | ICD-10-CM | POA: Diagnosis present

## 2023-01-04 DIAGNOSIS — R5381 Other malaise: Secondary | ICD-10-CM

## 2023-01-04 DIAGNOSIS — Z8249 Family history of ischemic heart disease and other diseases of the circulatory system: Secondary | ICD-10-CM | POA: Diagnosis not present

## 2023-01-04 DIAGNOSIS — E440 Moderate protein-calorie malnutrition: Secondary | ICD-10-CM | POA: Insufficient documentation

## 2023-01-04 DIAGNOSIS — K219 Gastro-esophageal reflux disease without esophagitis: Secondary | ICD-10-CM | POA: Insufficient documentation

## 2023-01-04 DIAGNOSIS — Z888 Allergy status to other drugs, medicaments and biological substances status: Secondary | ICD-10-CM

## 2023-01-04 DIAGNOSIS — N179 Acute kidney failure, unspecified: Secondary | ICD-10-CM | POA: Diagnosis present

## 2023-01-04 DIAGNOSIS — Z833 Family history of diabetes mellitus: Secondary | ICD-10-CM

## 2023-01-04 DIAGNOSIS — E785 Hyperlipidemia, unspecified: Secondary | ICD-10-CM | POA: Diagnosis present

## 2023-01-04 DIAGNOSIS — I10 Essential (primary) hypertension: Secondary | ICD-10-CM | POA: Diagnosis present

## 2023-01-04 DIAGNOSIS — R079 Chest pain, unspecified: Secondary | ICD-10-CM | POA: Diagnosis not present

## 2023-01-04 DIAGNOSIS — Z6822 Body mass index (BMI) 22.0-22.9, adult: Secondary | ICD-10-CM

## 2023-01-04 DIAGNOSIS — M109 Gout, unspecified: Secondary | ICD-10-CM | POA: Diagnosis present

## 2023-01-04 DIAGNOSIS — Z9221 Personal history of antineoplastic chemotherapy: Secondary | ICD-10-CM

## 2023-01-04 DIAGNOSIS — R9431 Abnormal electrocardiogram [ECG] [EKG]: Secondary | ICD-10-CM | POA: Diagnosis not present

## 2023-01-04 DIAGNOSIS — Z886 Allergy status to analgesic agent status: Secondary | ICD-10-CM | POA: Diagnosis not present

## 2023-01-04 DIAGNOSIS — R918 Other nonspecific abnormal finding of lung field: Secondary | ICD-10-CM | POA: Diagnosis not present

## 2023-01-04 LAB — BASIC METABOLIC PANEL
Anion gap: 15 (ref 5–15)
BUN: 13 mg/dL (ref 8–23)
CO2: 20 mmol/L — ABNORMAL LOW (ref 22–32)
Calcium: 7.9 mg/dL — ABNORMAL LOW (ref 8.9–10.3)
Chloride: 96 mmol/L — ABNORMAL LOW (ref 98–111)
Creatinine, Ser: 1.84 mg/dL — ABNORMAL HIGH (ref 0.61–1.24)
GFR, Estimated: 39 mL/min — ABNORMAL LOW (ref >60)
Glucose, Bld: 105 mg/dL — ABNORMAL HIGH (ref 70–99)
Potassium: 2.5 mmol/L — CL (ref 3.5–5.1)
Sodium: 131 mmol/L — ABNORMAL LOW (ref 135–145)

## 2023-01-04 LAB — CBC
HCT: 35.1 % — ABNORMAL LOW (ref 39.0–52.0)
Hemoglobin: 12.1 g/dL — ABNORMAL LOW (ref 13.0–17.0)
MCH: 31.5 pg (ref 26.0–34.0)
MCHC: 34.5 g/dL (ref 30.0–36.0)
MCV: 91.4 fL (ref 80.0–100.0)
Platelets: 151 10*3/uL (ref 150–400)
RBC: 3.84 MIL/uL — ABNORMAL LOW (ref 4.22–5.81)
RDW: 18.2 % — ABNORMAL HIGH (ref 11.5–15.5)
WBC: 4.4 10*3/uL (ref 4.0–10.5)
nRBC: 0 % (ref 0.0–0.2)

## 2023-01-04 LAB — MAGNESIUM: Magnesium: 0.9 mg/dL — CL (ref 1.7–2.4)

## 2023-01-04 LAB — TROPONIN I (HIGH SENSITIVITY)
Troponin I (High Sensitivity): 17 ng/L (ref <18)
Troponin I (High Sensitivity): 15 ng/L (ref <18)

## 2023-01-04 MED ORDER — NICOTINE POLACRILEX 2 MG MT GUM: 2.0000 mg | CHEWING_GUM | OROMUCOSAL | Status: DC | PRN

## 2023-01-04 MED ORDER — SODIUM CHLORIDE 0.9 % IV BOLUS
1000.0000 mL | Freq: Once | INTRAVENOUS | Status: AC
  Administered 2023-01-04: 1000 mL via INTRAVENOUS

## 2023-01-04 MED ORDER — THIAMINE MONONITRATE 100 MG PO TABS
100.0000 mg | ORAL_TABLET | Freq: Every day | ORAL | Status: DC
  Administered 2023-01-04 – 2023-01-05 (×2): 100 mg via ORAL
  Filled 2023-01-04 (×2): qty 1

## 2023-01-04 MED ORDER — MAGNESIUM SULFATE 2 GM/50ML IV SOLN
2.0000 g | Freq: Once | INTRAVENOUS | Status: AC
  Administered 2023-01-04: 2 g via INTRAVENOUS
  Filled 2023-01-04: qty 50

## 2023-01-04 MED ORDER — LORAZEPAM 1 MG PO TABS: 0.0000 mg | ORAL_TABLET | Freq: Four times a day (QID) | ORAL | Status: DC

## 2023-01-04 MED ORDER — POTASSIUM CHLORIDE 20 MEQ PO PACK
40.0000 meq | PACK | Freq: Once | ORAL | Status: AC
  Administered 2023-01-05: 40 meq via ORAL
  Filled 2023-01-04: qty 2

## 2023-01-04 MED ORDER — LORAZEPAM 1 MG PO TABS: 1.0000 mg | ORAL_TABLET | Freq: Two times a day (BID) | ORAL | Status: DC

## 2023-01-04 MED ORDER — THIAMINE HCL 100 MG/ML IJ SOLN: 100.0000 mg | Freq: Every day | INTRAMUSCULAR | Status: DC

## 2023-01-04 MED ORDER — POTASSIUM CHLORIDE CRYS ER 20 MEQ PO TBCR
40.0000 meq | EXTENDED_RELEASE_TABLET | Freq: Once | ORAL | Status: AC
  Administered 2023-01-04: 40 meq via ORAL
  Filled 2023-01-04: qty 2

## 2023-01-04 MED ORDER — LORAZEPAM 1 MG PO TABS: 1.0000 mg | ORAL_TABLET | Freq: Four times a day (QID) | ORAL | Status: DC

## 2023-01-04 MED ORDER — LORAZEPAM 1 MG PO TABS: 0.0000 mg | ORAL_TABLET | Freq: Two times a day (BID) | ORAL | Status: DC

## 2023-01-04 NOTE — ED Provider Notes (Signed)
[facility] AT [facility] Provider Note   CSN: [id number] Arrival date & time: 01/04/23  1907     History {Add pertinent medical, surgical, social history, OB history to HPI:1} Chief Complaint  Patient presents with   Irregular Heart Beat    [name] is a 67 y.o. male.  HPI   Patient with medical history including alcohol dependency drinking about a pint of liquor daily, hypertension, diabetes, stage III right lung carcinoma currently in remission, presenting with complaints of abnormal EKG.  Patient states that he went to his ophthalmologist today was was to have a cornea repair, noted that his heart rate was in the 30s and it was irregular, so was sent here for further assessment.  Patient states he has no actual complaints, he states that he has had no chest pain no shortness of breath no worsening peripheral edema, denies any heart palpitations, near-syncope, he states that over the last month he has felt slightly more fatigue but states that he is also had decrease in appetite.  Denies any stomach pains nausea vomiting, states he is still tolerating p.o. but eats more less every other day, states he will have intermittent soft stools per denies any diarrhea or bloody stools.  He has no other complaints.  No history of PEs or DVTs no cardiac abnormalities.    Home Medications Prior to Admission medications   Medication Sig Start Date End Date Taking? Authorizing Provider  methocarbamol (ROBAXIN) 750 MG tablet Take 1 tablet (750 mg total) by mouth every 8 (eight) hours as needed for muscle spasms. 12/03/22   [name], MD  acetaminophen (TYLENOL) 500 MG tablet Take 2 tablets (1,000 mg total) by mouth every 6 (six) hours as needed for mild pain. 10/21/22   [name], PA-C  allopurinol (ZYLOPRIM) 300 MG tablet Take 1 tablet (300 mg total) by mouth daily. 12/31/21   [name], MD  ALPHAGAN P 0.1 % SOLN Apply to eye. 12/04/20    [provider]  amLODipine (NORVASC) 5 MG tablet Take 1 tablet (5 mg total) by mouth daily. 11/25/22 12/25/22  [name], MD  Apremilast 30 MG TABS Take 30 mg by mouth 2 (two) times daily.    [provider]  atorvastatin (LIPITOR) 40 MG tablet Take 1 tablet (40 mg total) by mouth daily. 11/24/22   [name], MD  Blood Glucose Monitoring Suppl (BAYER CONTOUR NEXT MONITOR) w/Device KIT Use as directed 05/17/16   Quentin Angst, MD  buPROPion (WELLBUTRIN XL) 150 MG 24 hr tablet TAKE 1 TABLET EVERY DAY 10/06/20   [name], MD  cetirizine (ZYRTEC) 10 MG tablet Take 1 tablet (10 mg total) by mouth daily. 12/31/21   [name], MD  clobetasol (OLUX) 0.05 % topical foam 1 application to affected area    [provider]  coal tar (NEUTROGENA T-GEL) 0.5 % shampoo Apply 1 application topically daily.    [provider]  docusate sodium (COLACE) 100 MG capsule Take 1 capsule (100 mg total) by mouth 2 (two) times daily. 10/21/22   [name], PA-C  dorzolamide-timolol (COSOPT) 22.3-6.8 MG/ML ophthalmic solution SMARTSIG:In Eye(s) 12/04/20   [provider]  fluticasone (FLONASE) 50 MCG/ACT nasal spray SHAKE LIQUID AND USE 1 SPRAY IN EACH NOSTRIL DAILY 11/25/22   [name], MD  gabapentin (NEURONTIN) 300 MG capsule Take 2 capsules (600 mg total) by mouth at bedtime. 12/31/21   [name], MD  glucose blood (BAYER CONTOUR NEXT  TEST) test strip Use as instructed 05/17/16   Quentin Angst, MD  guaiFENesin 200 MG tablet Take 1 tablet (200 mg total) by mouth every 8 (eight) hours as needed for cough or to loosen phlegm. 10/21/22   [name], PA-C  hydrocortisone (ANUSOL-HC) 25 MG suppository Place 1 suppository (25 mg total) rectally 2 (two) times daily. 02/16/20   [name], PA-C  hydroxypropyl methylcellulose / hypromellose (ISOPTO TEARS / GONIOVISC) 2.5 % ophthalmic solution Place 1 drop into both eyes 3  (three) times daily as needed for dry eyes.    [provider]  Insulin Pen Needle (PEN NEEDLES) 31G X 6 MM MISC Use lantus every night at hours of sleep 03/15/14   Holland Commons A, NP  MITIGARE 0.6 MG CAPS TAKE 2 CAPSULES BY MOUTH AT THE START OF GOUT ATTACK, MAY TAKE 1 CAPSULE AFTERWARDS IF SYMPTOMS PERSIST 02/19/19   [name], MD  naltrexone (DEPADE) 50 MG tablet Take 1 tablet (50 mg total) by mouth daily. 11/18/21   [name], MD  Omega-3 Fatty Acids (FISH OIL) 1200 MG CAPS Take 1,200 mg by mouth daily.     [provider]  omeprazole (PRILOSEC) 20 MG capsule TAKE 1 CAPSULE(20 MG) BY MOUTH DAILY Patient taking differently: Take 20 mg by mouth 3 (three) times a week. 04/12/22   [name], MD  OVER THE COUNTER MEDICATION Take 250 mg by mouth See admin instructions. Super Beta Prostate capsules- Take 250 mg by mouth once a day    [provider]  oxyCODONE 10 MG TABS Take 1 tablet (10 mg total) by mouth every 6 (six) hours as needed for severe pain (severe pain not relieved by tylenol, robaxin). 10/21/22   [name], PA-C  thiamine (VITAMIN B-1) 100 MG tablet Take 100 mg by mouth daily.    [provider]  triamcinolone cream (KENALOG) 0.1 % Apply 1 Application topically 2 (two) times daily. 12/31/21   [name], MD      Allergies    Ace inhibitors, Furosemide, and Aspirin    Review of Systems   Review of Systems  Constitutional:  Negative for chills and fever.  Respiratory:  Negative for shortness of breath.   Cardiovascular:  Negative for chest pain.  Gastrointestinal:  Negative for abdominal pain.  Neurological:  Negative for headaches.    Physical Exam Updated Vital Signs BP (!) 140/90   Pulse 86   Temp 97.6 F (36.4 C) (Oral)   Resp (!) 23   Ht 6\' 2"  (1.88 m)   Wt 81.2 kg   SpO2 99%   BMI 22.98 kg/m  Physical Exam Vitals and nursing note reviewed.  Constitutional:      General: He is not in acute  distress.    Appearance: He is not ill-appearing.  HENT:     Head: Normocephalic and atraumatic.     Nose: No congestion.  Eyes:     Conjunctiva/sclera: Conjunctivae normal.  Cardiovascular:     Rate and Rhythm: Normal rate and regular rhythm.     Pulses: Normal pulses.     Heart sounds: No murmur heard.    No friction rub. No gallop.  Pulmonary:     Effort: No respiratory distress.     Breath sounds: No wheezing, rhonchi or rales.  Abdominal:     Palpations: Abdomen is soft.     Tenderness: There is no abdominal tenderness. There is no right CVA tenderness or left CVA tenderness.  Musculoskeletal:  Right lower leg: No edema.     Left lower leg: No edema.     Comments: No unilateral leg swelling no calf tenderness no palpable cords.  Skin:    General: Skin is warm and dry.  Neurological:     Mental Status: He is alert.  Psychiatric:        Mood and Affect: Mood normal.     ED Results / Procedures / Treatments   Labs (all labs ordered are listed, but only abnormal results are displayed) Labs Reviewed  BASIC METABOLIC PANEL - Abnormal; Notable for the following components:      Result Value   Sodium 131 (*)    Potassium 2.5 (*)    Chloride 96 (*)    CO2 20 (*)    Glucose, Bld 105 (*)    Creatinine, Ser 1.84 (*)    Calcium 7.9 (*)    GFR, Estimated 39 (*)    All other components within normal limits  CBC - Abnormal; Notable for the following components:   RBC 3.84 (*)    Hemoglobin 12.1 (*)    HCT 35.1 (*)    RDW 18.2 (*)    All other components within normal limits  MAGNESIUM - Abnormal; Notable for the following components:   Magnesium 0.9 (*)    All other components within normal limits  TROPONIN I (HIGH SENSITIVITY)  TROPONIN I (HIGH SENSITIVITY)    EKG EKG Interpretation Date/Time:  Tuesday January 04 2023 19:21:44 EDT Ventricular Rate:  98 PR Interval:  124 QRS Duration:  92 QT Interval:  386 QTC Calculation: 492 R Axis:   67  Text  Interpretation: Sinus rhythm with Premature supraventricular complexes Nonspecific ST and T wave abnormality Prolonged QT Abnormal ECG When compared with ECG of 20-Oct-2022 10:33, PREVIOUS ECG IS PRESENT when compared to prior, some ST abnormalities. No STEMI  Confirmed by Theda Belfast ([id number]) on 01/04/2023 10:14:14 PM  Radiology DG Chest 2 View  Result Date: 01/04/2023 CLINICAL DATA:  Chest pain. EXAM: CHEST - 2 VIEW COMPARISON:  Chest radiograph dated 10/21/2022. FINDINGS: Stable chronic radiation changes of the right perihilar region. No new consolidation. There is no pleural effusion or pneumothorax. The cardiac silhouette is within normal limits. No acute osseous pathology. Degenerative changes of the spine. IMPRESSION: 1. No active cardiopulmonary disease. 2. Chronic radiation changes of the right perihilar region. Electronically Signed   By: Elgie Collard M.D.   On: 01/04/2023 20:00    Procedures Procedures  {Document cardiac monitor, telemetry assessment procedure when appropriate:1}  Medications Ordered in ED Medications  magnesium sulfate IVPB 2 g 50 mL (has no administration in time range)  potassium chloride SA (KLOR-CON M) CR tablet 40 mEq (has no administration in time range)  sodium chloride 0.9 % bolus 1,000 mL (has no administration in time range)  potassium chloride SA (KLOR-CON M) CR tablet 40 mEq (40 mEq Oral Given 01/04/23 2149)    ED Course/ Medical Decision Making/ A&P   {   Click here for ABCD2, HEART and other calculatorsREFRESH Note before signing :1}                          Medical Decision Making Risk Prescription drug management. Decision regarding hospitalization.   This patient presents to the ED for concern of abnormal EKG, this involves an extensive number of treatment options, and is a complaint that carries with it a high risk of complications and morbidity.  The  differential diagnosis includes electrolyte abnormality, ACS, PE,  sepsis    Additional history obtained:  Additional history obtained from daughter at bedside External records from outside source obtained and reviewed including oncology notes   Co morbidities that complicate the patient evaluation  Stage III lung carcinoma, diabetes,  Social Determinants of Health:  Alcohol dependency    Lab Tests:  I Ordered, and personally interpreted labs.  The pertinent results include: CBC shows normocytic anemia hemoglobin 12.1, BMP reveals sodium 131 potassium 2.5 chloride 96 CO2 28 glucose 105 calcium 1.8, mag 0.9, negative delta troponin   Imaging Studies ordered:  I ordered imaging studies including chest x-ray I independently visualized and interpreted imaging which showed without  significant abnormalities I agree with the radiologist interpretation   Cardiac Monitoring:  The patient was maintained on a cardiac monitor.  I personally viewed and interpreted the cardiac monitored which showed an underlying rhythm of: Sinus without signs of ischemia, nonspecific ST abnormalities, borderline prolonged QT   Medicines ordered and prescription drug management:  I ordered medication including fluids I have reviewed the patients home medicines and have made adjustments as needed  Critical Interventions:  Electrolyte derailment critically low magnesium with hypokalemia, will provide supplementation. Patient was reassessed resting comfortably vital signs remained stable   Reevaluation:  Presents with abnormal EKG, triage obtain basic lab workup imaging which I personally viewed, shows new AKI, with hypokalemia, will add on magnesium provide with fluids provide him with additional potassium pills.  Magnesium is critically low, will provide him magnesium, recommend admission for further observation and he is agreement this plan.  Consultations Obtained:  I requested consultation with the ***,  and discussed lab and imaging findings as well as  pertinent plan - they recommend: ***    Test Considered:  N/A    Rule out I have low suspicion for ACS as history is atypical, patient has no cardiac history, EKG shows no ST elevation., patient negative delta troponin.  Low suspicion for PE as patient denies pleuritic chest pain, shortness of breath, patient denies leg pain, no pedal edema noted on exam, vital signs reassuring nontachypneic nonhypoxic nontachycardic.  Low suspicion for AAA or aortic dissection as history is atypical, patient has low risk factors.  Low suspicion for systemic infection as patient is nontoxic-appearing, vital signs reassuring, no obvious source infection noted on exam.     Dispostion and problem list  After consideration of the diagnostic results and the patients response to treatment, I feel that the patent would benefit from admission.  Hypomagnesium-suspect multifactorial, poor oral intake, with decreased nutritional absorption from chronic alcohol use, patient will need continued monitoring and magnesium resuscitation. Hypokalemia-suspect multifactorial, poor oral intake, with decreased nutritional absorption from chronic alcohol use, patient will need continued monitoring and potassium resuscitation. AKI-likely dehydration will need gentle fluid resuscitation continue monitoring.      {Document critical care time when appropriate:1} {Document review of labs and clinical decision tools ie heart score, Chads2Vasc2 etc:1}  {Document your independent review of radiology images, and any outside records:1} {Document your discussion with family members, caretakers, and with consultants:1} {Document social determinants of health affecting pt's care:1} {Document your decision making why or why not admission, treatments were needed:1} Final Clinical Impression(s) / ED Diagnoses Final diagnoses:  Hypokalemia  Hypomagnesemia    Rx / DC Orders ED Discharge Orders     None

## 2023-01-04 NOTE — ED Triage Notes (Addendum)
Pt arrives to ED c/o irregular HR/ abnormal EKG. Pt was seen for outpatient cornea surgery and was told that he was bradycardic with rates in 30's and also an abnormal EKG and was referred here. Pt with no other complaints or symptoms. Pt endorses drinking one pint of liquor today. Pt states he drinks everyday

## 2023-01-04 NOTE — Telephone Encounter (Signed)
Chief Complaint: low heart rate  Symptoms: dizziness, SOB, fatigue Frequency: Onset this morning  Pertinent Negatives: Patient denies headache, chest pain and other symptoms Disposition: [x] ED /[] Urgent Care (no appt availability in office) / [] Appointment(In office/virtual)/ []  [facility]/ [] Home Care/ [] Refused Recommended Disposition /[] [facility]/ []  Follow-up with PCP Additional Notes: Spoke to patient and his daughter today. Patient reports going to have eye surgery this morning and the surgery was canceled due to the patient's low heart. Patient reported heart rate being in the 30's. Patient reports feeling dizzy when he bends over or stand up too fast, SOB and general fatigue. Advised patient to go to the ED for evaluation and daughter stated that she would drive him to the ED now. His daughter report that the paperwork from the eye center advised him to go to the ED as well. Patient was agreeable.  Reason for Disposition  Dizziness, lightheadedness, or weakness  Answer Assessment - Initial Assessment Questions 1. DESCRIPTION: "Please describe your heart rate or heartbeat that you are having" (e.g., fast/slow, regular/irregular, skipped or extra beats, "palpitations")     Slow heart rate 2. ONSET: "When did it start?" (Minutes, hours or days)      This morning  3. DURATION: "How long does it last" (e.g., seconds, minutes, hours)     Ongoing  4. PATTERN "Does it come and go, or has it been constant since it started?"  "Does it get worse with exertion?"   "Are you feeling it now?"     I feel fine, I'm sitting outside now. Sometimes I feel weak or tired   7. RECURRENT SYMPTOM: "Have you ever had this before?" If Yes, ask: "When was the last time?" and "What happened that time?"      I been told I have an irregular heart rate.   9. CARDIAC HISTORY: "Do you have any history of heart disease?" (e.g., heart attack, angina, bypass surgery, angioplasty, arrhythmia)       Heart disease 10. OTHER SYMPTOMS: "Do you have any other symptoms?" (e.g., dizziness, chest pain, sweating, difficulty breathing)       Dizziness, SOB  Protocols used: Heart Rate and Heartbeat Questions-A-AH
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
## 2023-01-04 NOTE — Telephone Encounter (Signed)
Noted  
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
## 2023-01-05 ENCOUNTER — Encounter (HOSPITAL_COMMUNITY): Payer: Self-pay | Admitting: Internal Medicine

## 2023-01-05 DIAGNOSIS — E876 Hypokalemia: Secondary | ICD-10-CM | POA: Diagnosis not present

## 2023-01-05 DIAGNOSIS — E440 Moderate protein-calorie malnutrition: Secondary | ICD-10-CM | POA: Insufficient documentation

## 2023-01-05 DIAGNOSIS — R7989 Other specified abnormal findings of blood chemistry: Secondary | ICD-10-CM

## 2023-01-05 DIAGNOSIS — K219 Gastro-esophageal reflux disease without esophagitis: Secondary | ICD-10-CM | POA: Diagnosis not present

## 2023-01-05 DIAGNOSIS — N179 Acute kidney failure, unspecified: Secondary | ICD-10-CM | POA: Insufficient documentation

## 2023-01-05 DIAGNOSIS — D61818 Other pancytopenia: Secondary | ICD-10-CM | POA: Insufficient documentation

## 2023-01-05 DIAGNOSIS — I10 Essential (primary) hypertension: Secondary | ICD-10-CM | POA: Diagnosis not present

## 2023-01-05 LAB — HEPATIC FUNCTION PANEL
ALT: 16 U/L (ref 0–44)
AST: 40 U/L (ref 15–41)
Albumin: 2.9 g/dL — ABNORMAL LOW (ref 3.5–5.0)
Alkaline Phosphatase: 134 U/L — ABNORMAL HIGH (ref 38–126)
Bilirubin, Direct: 0.4 mg/dL — ABNORMAL HIGH (ref 0.0–0.2)
Indirect Bilirubin: 0.4 mg/dL (ref 0.3–0.9)
Total Bilirubin: 0.8 mg/dL (ref 0.3–1.2)
Total Protein: 7 g/dL (ref 6.5–8.1)

## 2023-01-05 LAB — BASIC METABOLIC PANEL
Anion gap: 10 (ref 5–15)
BUN: 15 mg/dL (ref 8–23)
CO2: 25 mmol/L (ref 22–32)
Calcium: 7.9 mg/dL — ABNORMAL LOW (ref 8.9–10.3)
Chloride: 102 mmol/L (ref 98–111)
Creatinine, Ser: 1.72 mg/dL — ABNORMAL HIGH (ref 0.61–1.24)
GFR, Estimated: 43 mL/min — ABNORMAL LOW (ref >60)
Glucose, Bld: 84 mg/dL (ref 70–99)
Potassium: 4.6 mmol/L (ref 3.5–5.1)
Sodium: 137 mmol/L (ref 135–145)

## 2023-01-05 LAB — CBC
HCT: 32.6 % — ABNORMAL LOW (ref 39.0–52.0)
Hemoglobin: 11.5 g/dL — ABNORMAL LOW (ref 13.0–17.0)
MCH: 33.3 pg (ref 26.0–34.0)
MCHC: 35.3 g/dL (ref 30.0–36.0)
MCV: 94.5 fL (ref 80.0–100.0)
Platelets: 90 10*3/uL — ABNORMAL LOW (ref 150–400)
RBC: 3.45 MIL/uL — ABNORMAL LOW (ref 4.22–5.81)
RDW: 18.6 % — ABNORMAL HIGH (ref 11.5–15.5)
WBC: 2.8 10*3/uL — ABNORMAL LOW (ref 4.0–10.5)
nRBC: 0 % (ref 0.0–0.2)

## 2023-01-05 LAB — URINALYSIS, COMPLETE (UACMP) WITH MICROSCOPIC
Bacteria, UA: NONE SEEN
Bilirubin Urine: NEGATIVE
Glucose, UA: NEGATIVE mg/dL
Hgb urine dipstick: NEGATIVE
Ketones, ur: NEGATIVE mg/dL
Leukocytes,Ua: NEGATIVE
Nitrite: NEGATIVE
Protein, ur: 30 mg/dL — AB
Specific Gravity, Urine: 1.004 — ABNORMAL LOW (ref 1.005–1.030)
pH: 6 (ref 5.0–8.0)

## 2023-01-05 LAB — SODIUM, URINE, RANDOM: Sodium, Ur: 19 mmol/L

## 2023-01-05 LAB — MAGNESIUM: Magnesium: 1.3 mg/dL — ABNORMAL LOW (ref 1.7–2.4)

## 2023-01-05 LAB — PHOSPHORUS: Phosphorus: 3 mg/dL (ref 2.5–4.6)

## 2023-01-05 LAB — APTT: aPTT: 28 seconds (ref 24–36)

## 2023-01-05 LAB — PROTIME-INR
INR: 1.1 (ref 0.8–1.2)
Prothrombin Time: 14.9 seconds (ref 11.4–15.2)

## 2023-01-05 LAB — CREATININE, URINE, RANDOM: Creatinine, Urine: 111 mg/dL

## 2023-01-05 LAB — GLUCOSE, CAPILLARY: Glucose-Capillary: 103 mg/dL — ABNORMAL HIGH (ref 70–99)

## 2023-01-05 LAB — TSH: TSH: 3.643 u[IU]/mL (ref 0.350–4.500)

## 2023-01-05 MED ORDER — LORAZEPAM 1 MG PO TABS: 1.0000 mg | ORAL_TABLET | ORAL | Status: DC | PRN

## 2023-01-05 MED ORDER — ENSURE ENLIVE PO LIQD
237.0000 mL | Freq: Two times a day (BID) | ORAL | Status: DC
  Administered 2023-01-06: 237 mL via ORAL

## 2023-01-05 MED ORDER — ATORVASTATIN CALCIUM 40 MG PO TABS
40.0000 mg | ORAL_TABLET | Freq: Every day | ORAL | Status: DC
  Administered 2023-01-05 – 2023-01-06 (×2): 40 mg via ORAL
  Filled 2023-01-05 (×2): qty 1

## 2023-01-05 MED ORDER — METHOCARBAMOL 500 MG PO TABS: 750.0000 mg | ORAL_TABLET | Freq: Three times a day (TID) | ORAL | Status: DC | PRN

## 2023-01-05 MED ORDER — FOLIC ACID 1 MG PO TABS
1.0000 mg | ORAL_TABLET | Freq: Every day | ORAL | Status: DC
  Administered 2023-01-05 – 2023-01-06 (×2): 1 mg via ORAL
  Filled 2023-01-05 (×2): qty 1

## 2023-01-05 MED ORDER — AMLODIPINE BESYLATE 5 MG PO TABS
5.0000 mg | ORAL_TABLET | Freq: Every day | ORAL | Status: DC
  Administered 2023-01-06: 5 mg via ORAL
  Filled 2023-01-05: qty 1

## 2023-01-05 MED ORDER — LORAZEPAM 2 MG/ML IJ SOLN: 1.0000 mg | INTRAMUSCULAR | Status: DC | PRN

## 2023-01-05 MED ORDER — THIAMINE HCL 100 MG/ML IJ SOLN: 100.0000 mg | Freq: Every day | INTRAMUSCULAR | Status: DC

## 2023-01-05 MED ORDER — MAGNESIUM OXIDE -MG SUPPLEMENT 400 (240 MG) MG PO TABS
400.0000 mg | ORAL_TABLET | Freq: Two times a day (BID) | ORAL | Status: DC
  Administered 2023-01-06: 400 mg via ORAL
  Filled 2023-01-05: qty 1

## 2023-01-05 MED ORDER — LORAZEPAM 1 MG PO TABS: 0.0000 mg | ORAL_TABLET | Freq: Two times a day (BID) | ORAL | Status: DC

## 2023-01-05 MED ORDER — ENOXAPARIN SODIUM 40 MG/0.4ML IJ SOSY: 40.0000 mg | PREFILLED_SYRINGE | INTRAMUSCULAR | Status: DC

## 2023-01-05 MED ORDER — THIAMINE MONONITRATE 100 MG PO TABS
100.0000 mg | ORAL_TABLET | Freq: Every day | ORAL | Status: DC
  Administered 2023-01-06: 100 mg via ORAL
  Filled 2023-01-05: qty 1

## 2023-01-05 MED ORDER — POLYETHYLENE GLYCOL 3350 17 G PO PACK: 17.0000 g | PACK | Freq: Every day | ORAL | Status: DC | PRN

## 2023-01-05 MED ORDER — LACTATED RINGERS IV SOLN: INTRAVENOUS | Status: DC

## 2023-01-05 MED ORDER — ACETAMINOPHEN 650 MG RE SUPP: 650.0000 mg | Freq: Four times a day (QID) | RECTAL | Status: DC | PRN

## 2023-01-05 MED ORDER — ACETAMINOPHEN 325 MG PO TABS: 650.0000 mg | ORAL_TABLET | Freq: Four times a day (QID) | ORAL | Status: DC | PRN

## 2023-01-05 MED ORDER — ADULT MULTIVITAMIN W/MINERALS CH
1.0000 | ORAL_TABLET | Freq: Every day | ORAL | Status: DC
  Administered 2023-01-05 – 2023-01-06 (×2): 1 via ORAL
  Filled 2023-01-05 (×2): qty 1

## 2023-01-05 MED ORDER — LORATADINE 10 MG PO TABS: 10.0000 mg | ORAL_TABLET | Freq: Every day | ORAL | Status: DC

## 2023-01-05 MED ORDER — SODIUM CHLORIDE 0.9 % IV SOLN: INTRAVENOUS | Status: DC

## 2023-01-05 MED ORDER — LORAZEPAM 1 MG PO TABS: 0.0000 mg | ORAL_TABLET | Freq: Four times a day (QID) | ORAL | Status: DC

## 2023-01-05 MED ORDER — MAGNESIUM SULFATE 4 GM/100ML IV SOLN
4.0000 g | Freq: Once | INTRAVENOUS | Status: AC
  Administered 2023-01-05: 4 g via INTRAVENOUS
  Filled 2023-01-05: qty 100

## 2023-01-05 MED ORDER — FLUTICASONE PROPIONATE 50 MCG/ACT NA SUSP
1.0000 | Freq: Every day | NASAL | Status: DC
  Administered 2023-01-06: 1 via NASAL
  Filled 2023-01-05: qty 16

## 2023-01-05 MED ORDER — SODIUM CHLORIDE 0.9% FLUSH
3.0000 mL | Freq: Two times a day (BID) | INTRAVENOUS | Status: DC
  Administered 2023-01-05 – 2023-01-06 (×2): 3 mL via INTRAVENOUS

## 2023-01-05 MED ORDER — PANTOPRAZOLE SODIUM 40 MG PO TBEC
40.0000 mg | DELAYED_RELEASE_TABLET | Freq: Every day | ORAL | Status: DC
  Administered 2023-01-05 – 2023-01-06 (×2): 40 mg via ORAL
  Filled 2023-01-05 (×2): qty 1

## 2023-01-05 NOTE — Plan of Care (Signed)

## 2023-01-05 NOTE — Assessment & Plan Note (Signed)
This is severe, KF only 2.5.  Patient has received 80 mEq of oral KCl in the ER which would raise the potassium level to approximately 2.9 mEq.  I will give additional 40 mEq.  And then check the potassium at approximately 2 AM.  Etiology of this is under workup.  I suspect this is related to poor p.o. intake given that the patient does have severe alcohol abuse as well as finding of malnutrition findings on exam.  Therefore I will request dietitian evaluation.  Regarding alcohol abuse, I have continued ER standing orders for as needed benzodiazepines based on alcohol withdrawal symptoms.  Patient would need outpatient referral for tobacco use as well as alcohol use.  Therefore I have sent outpatient referral to tobacco cessation program.  Check serum phosphorus

## 2023-01-05 NOTE — Progress Notes (Signed)
PROGRESS NOTE    [name]  ZOX:[id number] DOB: [birth year] DOA: 01/04/2023 PCP: [name], MD    Chief Complaint  Patient presents with   Irregular Heart Beat    Brief Narrative:  Patient 66 year old gentleman history of GERD, diabetes mellitus, alcohol use, GERD, gout, hypertension, hyperlipidemia in his usual state of health went to the ophthalmologist office for scheduled cataract surgery had been n.p.o. the night prior and was sent to the ED as patient noted to be bradycardic with heart rates in the 30s per report.  Patient denied any chest pain, shortness of breath, presyncope episodes or syncopal episodes.  Patient remained totally asymptomatic.  Patient seen in the ED and noted not to be bradycardic.  Lab work done concerning for acute kidney injury, hypomagnesemia and hypokalemia and as such hospitalist were consulted to admit the patient for further evaluation and management.   Assessment & Plan:   Principal Problem:   AKI (acute kidney injury) (HCC) Active Problems:   Hypertension   Essential hypertension   Hypokalemia   High serum creatinine   Hypomagnesemia   Malnutrition of moderate degree   GERD (gastroesophageal reflux disease)   Pancytopenia (HCC)  #1 acute kidney injury -Likely secondary to a prerenal azotemia in the setting of dehydration as patient noted to have been n.p.o. the night before for his cataract surgery. -Urinalysis done nitrite negative, leukocytes negative, 30 protein, specific gravity of 1.004. -Urine sodium of 19, urine creatinine 111. -Last creatinine noted of 1.19 on 11/10/2022. -Creatinine on admission of 1.84 currently trending down currently at 1.72 today. -Urine output not properly recorded. -Continue gentle hydration with IV fluids. -Supportive care.  2.  Hypomagnesemia/hypokalemia -Likely in the setting of chronic alcohol use. -Patient denies any nausea vomiting or diarrhea. -Potassium on admission noted at 2.5 and  repleted currently at 4.6 this morning. -Magnesium noted at 0.9 on admission status post 2 g IV magnesium currently at 1.3. -Magnesium sulfate 4 g IV x 1. -Start oral magnesium supplementation tomorrow.  3.  History of alcohol use -Patient with history of alcohol use, states he drinks a pint of vodka with 2 other friends almost on a daily basis. -Patient with no overt signs of withdrawal. -Electrolytes being repleted. -Placed on the Ativan CIWA withdrawal protocol, thiamine, multivitamin, folic acid. -Alcohol cessation stressed to patient.  4.  Pancytopenia -Chronic. -Likely secondary to alcohol use. -Outpatient follow-up.  5.  Hypertension -Resume home regimen Norvasc.  6.  GERD -PPI.  7.  Moderate protein calorie malnutrition -Nutritional supplementation.   DVT prophylaxis: Lovenox>>>> SCDs Code Status: Full Family Communication: Updated patient.  No family at bedside. Disposition: Likely home when clinically improved, improvement with renal function hopefully in the next 1 to 2 days.  Status is: Observation The patient remains OBS appropriate and will d/c before 2 midnights.   Consultants:  None  Procedures:  Chest x-ray 01/04/2023   Antimicrobials:  Anti-infectives (From admission, onward)    None         Subjective: Sitting up in bed eating lunch.  Denies any chest pain or shortness of breath.  No abdominal pain.  Denies any tremors.  Feeling well.  Asking when he is going to be able to go home.    Objective: Vitals:   01/05/23 0546 01/05/23 0730 01/05/23 1030 01/05/23 1606  BP: 138/78 131/88 (!) 141/93 (!) 152/99  Pulse: 79 80  78  Resp: 18 18 18 17   Temp: 97.7 F (36.5 C) 97.9 F (36.6 C) (!) 97.5  F (36.4 C) 97.9 F (36.6 C)  TempSrc: Oral Oral Oral Oral  SpO2: 100% 100% 100% 100%  Weight:      Height:        Intake/Output Summary (Last 24 hours) at 01/05/2023 1854 Last data filed at 01/05/2023 1300 Gross per 24 hour  Intake 1075 ml  Output  --  Net 1075 ml   Filed Weights   01/04/23 1937 01/05/23 0139  Weight: 81.2 kg 81.1 kg    Examination:  General exam: Appears calm and comfortable  Respiratory system: Clear to auscultation. Respiratory effort normal. Cardiovascular system: S1 & S2 heard, RRR. No JVD, murmurs, rubs, gallops or clicks. No pedal edema. Gastrointestinal system: Abdomen is nondistended, soft and nontender. No organomegaly or masses felt. Normal bowel sounds heard. Central nervous system: Alert and oriented. No focal neurological deficits. Extremities: Symmetric 5 x 5 power. Skin: No rashes, lesions or ulcers Psychiatry: Judgement and insight appear normal. Mood & affect appropriate.     Data Reviewed: I have personally reviewed following labs and imaging studies  CBC: Recent Labs  Lab 01/04/23 1952 01/05/23 0436  WBC 4.4 2.8*  HGB 12.1* 11.5*  HCT 35.1* 32.6*  MCV 91.4 94.5  PLT 151 90*    Basic Metabolic Panel: Recent Labs  Lab 01/04/23 1952 01/04/23 2153 01/05/23 0436 01/05/23 0437  NA 131*  --   --  137  K 2.5*  --   --  4.6  CL 96*  --   --  102  CO2 20*  --   --  25  GLUCOSE 105*  --   --  84  BUN 13  --   --  15  CREATININE 1.84*  --   --  1.72*  CALCIUM 7.9*  --   --  7.9*  MG  --  0.9*  --  1.3*  PHOS  --   --  3.0  --     GFR: Estimated Creatinine Clearance: 47.2 mL/min (A) (by C-G formula based on SCr of 1.72 mg/dL (H)).  Liver Function Tests: Recent Labs  Lab 01/05/23 0437  AST 40  ALT 16  ALKPHOS 134*  BILITOT 0.8  PROT 7.0  ALBUMIN 2.9*    CBG: Recent Labs  Lab 01/05/23 1605  GLUCAP 103*     No results found for this or any previous visit (from the past 240 hour(s)).       Radiology Studies: DG Chest 2 View  Result Date: 01/04/2023 CLINICAL DATA:  Chest pain. EXAM: CHEST - 2 VIEW COMPARISON:  Chest radiograph dated 10/21/2022. FINDINGS: Stable chronic radiation changes of the right perihilar region. No new consolidation. There is no pleural  effusion or pneumothorax. The cardiac silhouette is within normal limits. No acute osseous pathology. Degenerative changes of the spine. IMPRESSION: 1. No active cardiopulmonary disease. 2. Chronic radiation changes of the right perihilar region. Electronically Signed   By: Elgie Collard M.D.   On: 01/04/2023 20:00        Scheduled Meds:  [START ON 01/06/2023] amLODipine  5 mg Oral Daily   atorvastatin  40 mg Oral Daily   [START ON 01/06/2023] enoxaparin (LOVENOX) injection  40 mg Subcutaneous Q24H   [START ON 01/06/2023] fluticasone  1 spray Each Nare Daily   folic acid  1 mg Oral Daily   LORazepam  0-4 mg Oral Q6H   Followed by   Melene Muller ON 01/07/2023] LORazepam  0-4 mg Oral Q12H   [START ON 01/06/2023] magnesium oxide  400 mg Oral BID   multivitamin with minerals  1 tablet Oral Daily   pantoprazole  40 mg Oral Daily   sodium chloride flush  3 mL Intravenous Q12H   thiamine  100 mg Oral Daily   Or   thiamine  100 mg Intravenous Daily   Continuous Infusions:  sodium chloride 125 mL/hr at 01/05/23 1025     LOS: 0 days    Time spent: 35 minutes    Ramiro Harvest, MD [facility]   To contact the attending provider between 7A-7P or the covering provider during after hours 7P-7A, please log into the web site [email or web address] and access using universal [place] password for that web site. If you do not have the password, please call the hospital operator.  01/05/2023, 6:54 PM

## 2023-01-05 NOTE — Assessment & Plan Note (Signed)
Unknown acuity, asymptomatic at this time.  Will treat with intake output monitoring check urinalysis sodium and creatinine.  Lactated Ringer's infusion.  Creatinine check bladder scan

## 2023-01-05 NOTE — Progress Notes (Signed)
Pt came onto unit at 0135. He was ambulatory, so he was able to walk from the ED stretcher to the bed. VS were obtained - WNL. Telemetry placed on pt - NSR at the time. Admission questions were done. Pt stated that he couldn't see that much in R eye, but can see well in the L eye. While talking with him, this nurse realized that he had a mild hearing impairment. He states that he has upper and lower dentures, but doesn't wear them. Pt came w/ a IV in his R FA w/ LR going at 100 ml/hr. Skin intact; skin assessment complete w/ Lanora Manis, RN (charge nurse). CIWA will be done on pt; currently a 2. Pt is A&Ox4. Bedside table in reach and call bell in place. Pt also has a acell phone at bedside. Pt was informed on room functions and expressed no concerns at this moment. Will continue to monitor throughout night.

## 2023-01-05 NOTE — Progress Notes (Signed)
Initial Nutrition Assessment  DOCUMENTATION CODES:   Non-severe (moderate) malnutrition in context of social or environmental circumstances  INTERVENTION:   Chocolate Ensure Plus High Protein po BID, each supplement provides 350 kcal and 20 grams of protein.  Encourage po intake   Education on adequate nutrition    NUTRITION DIAGNOSIS:   Moderate Malnutrition related to social / environmental circumstances as evidenced by moderate muscle depletion, moderate fat depletion, severe fat depletion, severe muscle depletion.  GOAL:   Patient will meet greater than or equal to 90% of their needs  MONITOR:   PO intake, Supplement acceptance, Labs, Weight trends, Skin, I & O's  REASON FOR ASSESSMENT:   Consult Assessment of nutrition requirement/status  ASSESSMENT:   67 y.o. male with PMHx including GERD, gout, arthritis, T2DM, diverticulosis, hx of kidney stones, HLD, HTN, mouth bleeding at night, pancreatitis, stage 3 lung cancer presents with bradycardia from ophthalmologist office during cataract procedure. Patient admitted for electrolyte abnormalities  Visited patient at bedside whose speech can be difficult to understand. He reports poor appetite PTA and that he would graze throughout the day. He attributes decreased appetite to poor dentition which he plans on getting fixed after dc from hospital. Patient states he has lost ~10# in the past month of 2 because he would try to eat and then his gums would start bleeding and he'd stop eating.   Patient has been reported to consume 1 pint of vodka per day.   Labs: Cr 1.72, mag 1.3, alk phos 134 Meds: folvite, ativan, mag-ox, magnesium sulfate, MVI, NS, thiamine, lactated ringers Wt: no significant wt loss noted, admit wt- 179#  PO: 50% meal intake x 1 documented meals  I/O's: +955 ml   NUTRITION - FOCUSED PHYSICAL EXAM:  Flowsheet Row Most Recent Value  Orbital Region Moderate depletion  Upper Arm Region Moderate depletion   Thoracic and Lumbar Region Unable to assess  Buccal Region Severe depletion  Temple Region Severe depletion  Clavicle Bone Region Severe depletion  Clavicle and Acromion Bone Region Severe depletion  Scapular Bone Region Unable to assess  Dorsal Hand Moderate depletion  Edema (RD Assessment) None  Hair Reviewed  Eyes Reviewed  Mouth Reviewed  [poor dentition]  Skin Reviewed  Nails Reviewed       Diet Order:   Diet Order             Diet regular Room service appropriate? Yes; Fluid consistency: Thin  Diet effective now                   EDUCATION NEEDS:   Education needs have been addressed  Skin:  Skin Assessment: Reviewed RN Assessment  Last BM:  7/3  Height:   Ht Readings from Last 1 Encounters:  01/05/23 6\' 2"  (1.88 m)    Weight:   Wt Readings from Last 1 Encounters:  01/05/23 81.1 kg    Ideal Body Weight:     BMI:  Body mass index is 22.96 kg/m.  Estimated Nutritional Needs:   Kcal:  1610-9604 kcal  Protein:  97-121 g  Fluid:  > 2L   Leodis Rains, RDN, LDN  Clinical Nutrition
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
## 2023-01-05 NOTE — TOC CM/SW Note (Signed)
Transition of Care [facility]) - Inpatient Brief Assessment   Patient Details  Name: Keyan Wean MRN: [id number] Date of Birth: March 17, 1956  Transition of Care [facility] CM/SW Contact:    Tom-Johnson, Hershal Coria, RN Phone Number: 01/05/2023, 10:54 AM   Clinical Narrative:  Patient presented to the ED from his  Ophthalmologist office for a scheduled Cataract surgery after noted to have Bradycardia with HR of 30 per report.   No TOC needs or recommendations noted at this time. CM will continue to follow as patient progresses with care towards discharge.       Transition of Care Asessment: Insurance and Status: Insurance coverage has been reviewed Patient has primary care physician: Yes Home environment has been reviewed: Yes Prior level of function:: Independent Prior/Current Home Services: No current home services Social Determinants of Health Reivew: SDOH reviewed no interventions necessary Readmission risk has been reviewed: Yes Transition of care needs: no transition of care needs at this time

## 2023-01-05 NOTE — Assessment & Plan Note (Signed)
Received 2 g of IV magnesium in the ER, recheck in the morning.  Maintain on telemetry

## 2023-01-06 DIAGNOSIS — Z8249 Family history of ischemic heart disease and other diseases of the circulatory system: Secondary | ICD-10-CM | POA: Diagnosis not present

## 2023-01-06 DIAGNOSIS — I10 Essential (primary) hypertension: Secondary | ICD-10-CM | POA: Diagnosis present

## 2023-01-06 DIAGNOSIS — F1020 Alcohol dependence, uncomplicated: Secondary | ICD-10-CM | POA: Diagnosis present

## 2023-01-06 DIAGNOSIS — E440 Moderate protein-calorie malnutrition: Secondary | ICD-10-CM

## 2023-01-06 DIAGNOSIS — D61818 Other pancytopenia: Secondary | ICD-10-CM

## 2023-01-06 DIAGNOSIS — E785 Hyperlipidemia, unspecified: Secondary | ICD-10-CM | POA: Diagnosis present

## 2023-01-06 DIAGNOSIS — E860 Dehydration: Secondary | ICD-10-CM | POA: Diagnosis present

## 2023-01-06 DIAGNOSIS — Z6822 Body mass index (BMI) 22.0-22.9, adult: Secondary | ICD-10-CM | POA: Diagnosis not present

## 2023-01-06 DIAGNOSIS — K219 Gastro-esophageal reflux disease without esophagitis: Secondary | ICD-10-CM | POA: Diagnosis present

## 2023-01-06 DIAGNOSIS — Z820 Family history of epilepsy and other diseases of the nervous system: Secondary | ICD-10-CM | POA: Diagnosis not present

## 2023-01-06 DIAGNOSIS — Z79899 Other long term (current) drug therapy: Secondary | ICD-10-CM | POA: Diagnosis not present

## 2023-01-06 DIAGNOSIS — Z9221 Personal history of antineoplastic chemotherapy: Secondary | ICD-10-CM | POA: Diagnosis not present

## 2023-01-06 DIAGNOSIS — Z886 Allergy status to analgesic agent status: Secondary | ICD-10-CM | POA: Diagnosis not present

## 2023-01-06 DIAGNOSIS — E876 Hypokalemia: Secondary | ICD-10-CM | POA: Diagnosis present

## 2023-01-06 DIAGNOSIS — E119 Type 2 diabetes mellitus without complications: Secondary | ICD-10-CM | POA: Diagnosis present

## 2023-01-06 DIAGNOSIS — N179 Acute kidney failure, unspecified: Secondary | ICD-10-CM | POA: Diagnosis present

## 2023-01-06 DIAGNOSIS — Z833 Family history of diabetes mellitus: Secondary | ICD-10-CM | POA: Diagnosis not present

## 2023-01-06 DIAGNOSIS — M109 Gout, unspecified: Secondary | ICD-10-CM | POA: Diagnosis present

## 2023-01-06 DIAGNOSIS — Z888 Allergy status to other drugs, medicaments and biological substances status: Secondary | ICD-10-CM | POA: Diagnosis not present

## 2023-01-06 DIAGNOSIS — Z85118 Personal history of other malignant neoplasm of bronchus and lung: Secondary | ICD-10-CM | POA: Diagnosis not present

## 2023-01-06 DIAGNOSIS — F17210 Nicotine dependence, cigarettes, uncomplicated: Secondary | ICD-10-CM | POA: Diagnosis present

## 2023-01-06 LAB — BASIC METABOLIC PANEL
Anion gap: 9 (ref 5–15)
BUN: 15 mg/dL (ref 8–23)
CO2: 23 mmol/L (ref 22–32)
Calcium: 8.2 mg/dL — ABNORMAL LOW (ref 8.9–10.3)
Chloride: 102 mmol/L (ref 98–111)
Creatinine, Ser: 1.25 mg/dL — ABNORMAL HIGH (ref 0.61–1.24)
GFR, Estimated: >60 mL/min (ref >60)
Glucose, Bld: 95 mg/dL (ref 70–99)
Potassium: 3.3 mmol/L — ABNORMAL LOW (ref 3.5–5.1)
Sodium: 134 mmol/L — ABNORMAL LOW (ref 135–145)

## 2023-01-06 LAB — CBC WITH DIFFERENTIAL/PLATELET
Abs Immature Granulocytes: 0.01 10*3/uL (ref 0.00–0.07)
Basophils Absolute: 0 10*3/uL (ref 0.0–0.1)
Basophils Relative: 0 %
Eosinophils Absolute: 0.2 10*3/uL (ref 0.0–0.5)
Eosinophils Relative: 7 %
HCT: 33.1 % — ABNORMAL LOW (ref 39.0–52.0)
Hemoglobin: 11.7 g/dL — ABNORMAL LOW (ref 13.0–17.0)
Immature Granulocytes: 0 %
Lymphocytes Relative: 22 %
Lymphs Abs: 0.5 10*3/uL — ABNORMAL LOW (ref 0.7–4.0)
MCH: 33.5 pg (ref 26.0–34.0)
MCHC: 35.3 g/dL (ref 30.0–36.0)
MCV: 94.8 fL (ref 80.0–100.0)
Monocytes Absolute: 0.2 10*3/uL (ref 0.1–1.0)
Monocytes Relative: 10 %
Neutro Abs: 1.3 10*3/uL — ABNORMAL LOW (ref 1.7–7.7)
Neutrophils Relative %: 61 %
Platelets: 72 10*3/uL — ABNORMAL LOW (ref 150–400)
RBC: 3.49 MIL/uL — ABNORMAL LOW (ref 4.22–5.81)
RDW: 18.5 % — ABNORMAL HIGH (ref 11.5–15.5)
WBC: 2.2 10*3/uL — ABNORMAL LOW (ref 4.0–10.5)
nRBC: 0 % (ref 0.0–0.2)

## 2023-01-06 LAB — MAGNESIUM: Magnesium: 1.2 mg/dL — ABNORMAL LOW (ref 1.7–2.4)

## 2023-01-06 MED ORDER — ACETAMINOPHEN 325 MG PO TABS: 650.0000 mg | ORAL_TABLET | Freq: Four times a day (QID) | ORAL | Status: AC | PRN

## 2023-01-06 MED ORDER — ADULT MULTIVITAMIN W/MINERALS CH: 1.0000 | ORAL_TABLET | Freq: Every day | ORAL | Status: DC

## 2023-01-06 MED ORDER — POTASSIUM CHLORIDE CRYS ER 10 MEQ PO TBCR
40.0000 meq | EXTENDED_RELEASE_TABLET | Freq: Once | ORAL | Status: AC
  Administered 2023-01-06: 40 meq via ORAL
  Filled 2023-01-06: qty 4

## 2023-01-06 MED ORDER — AMLODIPINE BESYLATE 10 MG PO TABS: 10.0000 mg | ORAL_TABLET | Freq: Every day | ORAL | Status: DC

## 2023-01-06 MED ORDER — NICOTINE POLACRILEX 2 MG MT GUM: 2.0000 mg | CHEWING_GUM | OROMUCOSAL | 0 refills | Status: DC | PRN

## 2023-01-06 MED ORDER — FOLIC ACID 1 MG PO TABS: 1.0000 mg | ORAL_TABLET | Freq: Every day | ORAL | Status: DC

## 2023-01-06 MED ORDER — MAGNESIUM OXIDE -MG SUPPLEMENT 400 (240 MG) MG PO TABS: 800.0000 mg | ORAL_TABLET | Freq: Two times a day (BID) | ORAL | Status: DC

## 2023-01-06 MED ORDER — ENSURE ENLIVE PO LIQD: 237.0000 mL | Freq: Two times a day (BID) | ORAL | 12 refills | Status: DC

## 2023-01-06 MED ORDER — MAGNESIUM OXIDE -MG SUPPLEMENT 400 (240 MG) MG PO TABS: 800.0000 mg | ORAL_TABLET | Freq: Two times a day (BID) | ORAL | 0 refills | Status: DC

## 2023-01-06 MED ORDER — AMLODIPINE BESYLATE 5 MG PO TABS
5.0000 mg | ORAL_TABLET | Freq: Once | ORAL | Status: AC
  Administered 2023-01-06: 5 mg via ORAL
  Filled 2023-01-06: qty 1

## 2023-01-06 MED ORDER — AMLODIPINE BESYLATE 10 MG PO TABS: 10.0000 mg | ORAL_TABLET | Freq: Every day | ORAL | 1 refills | Status: DC

## 2023-01-06 MED ORDER — PANTOPRAZOLE SODIUM 40 MG PO TBEC: 40.0000 mg | DELAYED_RELEASE_TABLET | Freq: Every day | ORAL | 1 refills | Status: DC

## 2023-01-06 MED ORDER — MAGNESIUM SULFATE 50 % IJ SOLN
6.0000 g | Freq: Once | INTRAVENOUS | Status: AC
  Administered 2023-01-06: 6 g via INTRAVENOUS
  Filled 2023-01-06: qty 10

## 2023-01-06 NOTE — Plan of Care (Signed)

## 2023-01-06 NOTE — Progress Notes (Signed)
DISCHARGE NOTE HOME [name] to be discharged Home per MD order. Discussed prescriptions and follow up appointments with the patient. Prescriptions given to patient; medication list explained in detail. Patient verbalized understanding.  Skin clean, dry and intact without evidence of skin break down, no evidence of skin tears noted. IV catheter discontinued intact. Site without signs and symptoms of complications. Dressing and pressure applied. Pt denies pain at the site currently. No complaints noted.  Patient free of lines, drains, and wounds.   An After Visit Summary (AVS) was printed and given to the patient. Patient escorted via wheelchair, and discharged home via private auto.  Margarita Grizzle, RN

## 2023-01-06 NOTE — Evaluation (Signed)
Physical Therapy Evaluation Patient Details Name: Saul Lawery MRN: [id number] DOB: 1956/06/19 Today's Date: 01/06/2023  History of Present Illness  67 yo male who was sent to the ED on 7/2 by ophthalmologist due to having HR of 30bpm. Admitted with hypomagnesemia, increased serum creatine, and hypokalemia. PMH OA, DM, gout, HLD, HTN, ventral hernia s/p repair, pancreatitis, hx stage iii squamous cell carcinoma in lung, exploratory laprarotomy, eye surgery  Clinical Impression   Observed patient from the hallway prior to eval, he was able to mobilize well in the room and reports no concerns with mobility, RN also reports he has done well with mobility with no noted concerns. Rechecked BP as his diastolic pressures had been high earlier, unfortunately they were still elevated at this time (diastolic pressure 108 on first check, and 111 on second check) which does prevent safe mobility with PT per current hospital guidelines. RN educated on BP findings. Performed light MMT at bed level, he does have some muscle weakness. Left in bed with all needs met this morning- given that he is mobilizing well but does have some notable weakness as well as hx of recent fall, would benefit from post-acute PT f/u. He is agreeable to this.   No charge for this visit since we were unable to formally mobilize with PT given medical status.       Assistance Recommended at Discharge None  If plan is discharge home, recommend the following:  Can travel by private vehicle  Help with stairs or ramp for entrance        Equipment Recommendations None recommended by PT  Recommendations for Other Services       Functional Status Assessment Patient has had a recent decline in their functional status and demonstrates the ability to make significant improvements in function in a reasonable and predictable amount of time.     Precautions / Restrictions Precautions Precautions: Other (comment) Precaution Comments:  watch BP Restrictions Weight Bearing Restrictions: No      Mobility  Bed Mobility Overal bed mobility: Independent                  Transfers   Equipment used: None               General transfer comment: observed from hallway    Ambulation/Gait Ambulation/Gait assistance: Modified independent (Device/Increase time) Gait Distance (Feet):  (to/from bathroom) Assistive device: IV Pole Gait Pattern/deviations: WFL(Within Functional Limits)       General Gait Details: observed from hallway by PT  Stairs            Wheelchair Mobility     Tilt Bed    Modified Rankin (Stroke Patients Only)       Balance Overall balance assessment: Independent                                           Pertinent Vitals/Pain Pain Assessment Pain Assessment: No/denies pain Pain Score: 0-No pain    Home Living Family/patient expects to be discharged to:: Private residence Living Arrangements: Other (Comment) (nephew stays with him but works, usually there at night and son also comes to check on him during teh day) Available Help at Discharge: Family;Available PRN/intermittently Type of Home: House Home Access: Stairs to enter Entrance Stairs-Rails: Right Entrance Stairs-Number of Steps: 10 in the back, 2 STE in the front no rail   Home  Layout: One level Home Equipment: None      Prior Function Prior Level of Function : Independent/Modified Independent             Mobility Comments: enjoys gardening and working on carsj; had a fall last summer ADLs Comments: Independent wit hADL's and IADL's     Hand Dominance   Dominant Hand: Right    Extremity/Trunk Assessment   Upper Extremity Assessment Upper Extremity Assessment: Defer to OT evaluation    Lower Extremity Assessment Lower Extremity Assessment: Generalized weakness    Cervical / Trunk Assessment Cervical / Trunk Assessment: Normal  Communication   Communication: No  difficulties  Cognition Arousal/Alertness: Awake/alert Behavior During Therapy: WFL for tasks assessed/performed Overall Cognitive Status: Within Functional Limits for tasks assessed                                          General Comments General comments (skin integrity, edema, etc.): diastolic BP persistently high, 108 diastolic on first check 111 on second check in bed    Exercises     Assessment/Plan    PT Assessment Patient needs continued PT services  PT Problem List Decreased strength;Decreased activity tolerance       PT Treatment Interventions DME instruction;Gait training;Stair training;Therapeutic activities;Therapeutic exercise;Patient/family education    PT Goals (Current goals can be found in the Care Plan section)  Acute Rehab PT Goals Patient Stated Goal: go home when able PT Goal Formulation: With patient Time For Goal Achievement: 01/20/23 Potential to Achieve Goals: Good    Frequency Min 2X/week     Co-evaluation               AM-PAC PT "6 Clicks" Mobility  Outcome Measure Help needed turning from your back to your side while in a flat bed without using bedrails?: None Help needed moving from lying on your back to sitting on the side of a flat bed without using bedrails?: None Help needed moving to and from a bed to a chair (including a wheelchair)?: None Help needed standing up from a chair using your arms (e.g., wheelchair or bedside chair)?: None Help needed to walk in hospital room?: None Help needed climbing 3-5 steps with a railing? : A Little 6 Click Score: 23    End of Session   Activity Tolerance: Treatment limited secondary to medical complications (Comment) (elevated diastolic BP) Patient left: in bed;with call bell/phone within reach Nurse Communication: Mobility status;Other (comment) (elevated diastolics) PT Visit Diagnosis: Muscle weakness (generalized) (M62.81)    Time: 8469-6295 PT Time Calculation  (min) (ACUTE ONLY): 23 min   Charges:    PT General Charges $$ ACUTE PT VISIT: 1 Visit        Nedra Hai, PT, DPT 01/06/23 12:09 PM

## 2023-01-06 NOTE — Evaluation (Signed)
Occupational Therapy Evaluation Patient Details Name: [name] MRN: [id number] DOB: [birth year] Today's Date: 01/06/2023   History of Present Illness 67 yo male who was sent to the ED on 7/2 by ophthalmologist due to having HR of 30bpm. Admitted with hypomagnesemia, increased serum creatine, and hypokalemia. PMH OA, DM, gout, HLD, HTN, ventral hernia s/p repair, pancreatitis, hx stage iii squamous cell carcinoma in lung, exploratory laprarotomy, eye surgery   Clinical Impression   Pt is functioning independently. Has support of his nephew and daughter when he returns home. Pt is eager to discharge.     Recommendations for follow up therapy are one component of a multi-disciplinary discharge planning process, led by the attending physician.  Recommendations may be updated based on patient status, additional functional criteria and insurance authorization.   Assistance Recommended at Discharge PRN  Patient can return home with the following      Functional Status Assessment  Patient has not had a recent decline in their functional status  Equipment Recommendations  None recommended by OT    Recommendations for Other Services       Precautions / Restrictions Precautions Precautions: None Precaution Comments: watch BP Restrictions Weight Bearing Restrictions: No      Mobility Bed Mobility                    Transfers Overall transfer level: Independent Equipment used: None                      Balance Overall balance assessment: Independent                                         ADL either performed or assessed with clinical judgement   ADL Overall ADL's : Independent                                             Vision Ability to See in Adequate Light: 1 Impaired Patient Visual Report: No change from baseline Additional Comments: poor vision in R eye with planned surgery     Perception     Praxis       Pertinent Vitals/Pain Pain Assessment Pain Assessment: No/denies pain     Hand Dominance Right   Extremity/Trunk Assessment Upper Extremity Assessment Upper Extremity Assessment: Overall WFL for tasks assessed   Lower Extremity Assessment Lower Extremity Assessment: Defer to PT evaluation   Cervical / Trunk Assessment Cervical / Trunk Assessment: Normal   Communication Communication Communication: No difficulties   Cognition Arousal/Alertness: Awake/alert Behavior During Therapy: WFL for tasks assessed/performed Overall Cognitive Status: Within Functional Limits for tasks assessed                                       General Comments  diastolic BP persistently high, 108 diastolic on first check 111 on second check in bed    Exercises     Shoulder Instructions      Home Living Family/patient expects to be discharged to:: Private residence Living Arrangements: Other (Comment) (nephew) Available Help at Discharge: Family;Available PRN/intermittently Type of Home: House Home Access: Stairs to enter Entrance Stairs-Number of Steps: 10 in the back, 2 STE  in the front no rail Entrance Stairs-Rails: Right Home Layout: One level     Bathroom Shower/Tub: Producer, television/film/video: Standard     Home Equipment: None          Prior Functioning/Environment Prior Level of Function : Independent/Modified Independent             Mobility Comments: enjoys gardening and working on carsj; had a fall last summer ADLs Comments: Independent wit hADL's and IADL's        OT Problem List:        OT Treatment/Interventions:      OT Goals(Current goals can be found in the care plan section)    OT Frequency:      Co-evaluation              AM-PAC OT "6 Clicks" Daily Activity     Outcome Measure Help from another person eating meals?: None Help from another person taking care of personal grooming?: None Help from another person  toileting, which includes using toliet, bedpan, or urinal?: None Help from another person bathing (including washing, rinsing, drying)?: None Help from another person to put on and taking off regular upper body clothing?: None Help from another person to put on and taking off regular lower body clothing?: None 6 Click Score: 24   End of Session    Activity Tolerance: Patient tolerated treatment well Patient left: in chair;with call bell/phone within reach  OT Visit Diagnosis: Muscle weakness (generalized) (M62.81)                Time: 1610-9604 OT Time Calculation (min): 16 min Charges:  OT General Charges $OT Visit: 1 Visit OT Evaluation $OT Eval Low Complexity: 1 Low  [name], OTR/L Acute Rehabilitation Services Office: [phone]  Evern Bio 01/06/2023, 3:15 PM

## 2023-01-06 NOTE — Discharge Summary (Signed)
Physician Discharge Summary  [name] YQM:[id number] DOB: Apr 29, 1956 DOA: 01/04/2023  PCP: [name], MD  Admit date: 01/04/2023 Discharge date: 01/06/2023  Time spent: 55 minutes  Recommendations for Outpatient Follow-up:  Follow-up with [name], MD in 1 to 2 weeks.  On follow-up patient will need a basic metabolic profile, magnesium level done to follow-up on electrolytes and renal function.  Patient will need a CBC done to follow-up on chronic pancytopenia.  Patient will need blood pressure also reassessed as Norvasc was increased to 10 mg daily.   Discharge Diagnoses:  Principal Problem:   AKI (acute kidney injury) (HCC) Active Problems:   Hypertension   Essential hypertension   Hypokalemia   High serum creatinine   Hypomagnesemia   Malnutrition of moderate degree   GERD (gastroesophageal reflux disease)   Pancytopenia (HCC)   Discharge Condition: Stable and improved.  Diet recommendation: Heart healthy  Filed Weights   01/04/23 1937 01/05/23 0139 01/06/23 0702  Weight: 81.2 kg 81.1 kg 81.4 kg    History of present illness:  HPI per Dr. Versie [name] is a 67 y.o. male with medical history significant of as listed below.  Patient was in her in his usual state of health today where he went to an ophthalmologist office for a scheduled cataract surgery.  Patient was therefore n.p.o. since last midnight.  Patient was noted to have bradycardia down to heart rate of 30 per report.  Unfortunately I have no no direct communication with the clinic at this hour.  And we have no rhythm strips from the time.  Patient was sent to the ER in [facility].   Patient reports no palpitation chest pain shortness of breath presyncope loss of consciousness or similar symptoms.  Patient is totally asymptomatic.  Patient is NOT noted to have any bradycardia here in the ER.  However it is noted that the patient has electrolyte abnormalities as below, medical evaluation is sought.    Patient denies any vomiting or diarrhea.  Patient does drink 1 pint of vodka/liquor daily.  This has been going on for several years.  Patient reports not eating anything today because of n.p.o. status.  However even last evening he had a couple of bites of chicken and cannot recall what he had for lunch.  Therefore I suspect patient has been having poor p.o. intake for long time.  Hospital Course:  #1 acute kidney injury -Likely secondary to a prerenal azotemia in the setting of dehydration as patient noted to have been n.p.o. the night before for his cataract surgery. -Urinalysis done nitrite negative, leukocytes negative, 30 protein, specific gravity of 1.004. -Urine sodium of 19, urine creatinine 111. -Last creatinine noted of 1.19 on 11/10/2022. -Creatinine on admission of 1.84 and by day of discharge had improved to 1.25 with hydration.   -Outpatient follow-up with PCP.    2.  Hypomagnesemia/hypokalemia -In the setting of chronic alcohol use. -Patient denied any nausea vomiting or diarrhea. -Potassium on admission noted at 2.5 and repleted during the hospitalization.  On day of discharge potassium was 3.3, patient received 40 mEq of oral potassium supplementation on day of discharge.  -Magnesium noted at 0.9 on admission patient received a total of 12 g of IV magnesium during this hospitalization and will be discharged on oral magnesium supplementation.   -Outpatient follow-up with PCP.     3.  History of alcohol use -Patient with history of alcohol use, states he drinks a pint of vodka with 2 other friends almost  on a daily basis. -Patient with no overt signs of withdrawal during the hospitalization. -Patient was maintained on Ativan CIWA withdrawal protocol, thiamine, multivitamin, folic acid. -Electrolytes were repleted. -Alcohol cessation stressed to patient. -Outpatient follow-up.[name]   4.  Pancytopenia -Chronic. -Likely secondary to alcohol use. -Outpatient follow-up with PCP.    5.  Hypertension -Patient started back on home regimen Norvasc 5 mg daily and dose uptitrated to 10 mg daily for better blood pressure control.  -Outpatient follow-up with PCP.    6.  GERD -Patient maintained on PPI.   7.  Moderate protein calorie malnutrition -Nutritional supplementation.  8.??  Bradycardia -Patient noted to have been sent from ophthalmologist office due to concerns for bradycardia. -On presentation to the ED patient noted NOT to be bradycardic. -Patient monitored on telemetry throughout the hospitalization and had NO episodes of bradycardia. -Outpatient follow-up.      Procedures: Chest x-ray 01/04/2023  Consultations: None  Discharge Exam: Vitals:   01/06/23 0729 01/06/23 1300  BP: (!) 151/103 (!) 155/96  Pulse: 81   Resp: 18   Temp: 98.1 F (36.7 C)   SpO2: 100%     General: NAD. Cardiovascular: RRR no murmurs rubs or gallops.  No JVD.  No lower extremity edema. Respiratory: Clear to auscultation bilaterally.  No wheezes, no crackles, no rhonchi.  Fair air movement.  Speaking in full sentences.  Discharge Instructions   Discharge Instructions     Ambulatory referral to Physical Therapy   Complete by: As directed    Ambulatory referral to Smoking Cessation Program   Complete by: As directed    Diet - low sodium heart healthy   Complete by: As directed    Increase activity slowly   Complete by: As directed       Allergies as of 01/06/2023       Reactions   Ace Inhibitors Swelling, Other (See Comments)   Angioedema   Furosemide Anaphylaxis   Aspirin Other (See Comments)   "Made my nose bleed"        Medication List     STOP taking these medications    buPROPion 150 MG 24 hr tablet Commonly known as: WELLBUTRIN XL   naltrexone 50 MG tablet Commonly known as: DEPADE   omeprazole 20 MG capsule Commonly known as: PRILOSEC Replaced by: pantoprazole 40 MG tablet       TAKE these medications    acetaminophen 325 MG  tablet Commonly known as: TYLENOL Take 2 tablets (650 mg total) by mouth every 6 (six) hours as needed for mild pain (or Fever >/= 101).   amLODipine 10 MG tablet Commonly known as: NORVASC Take 1 tablet (10 mg total) by mouth daily. What changed:  medication strength how much to take   atorvastatin 40 MG tablet Commonly known as: LIPITOR Take 1 tablet (40 mg total) by mouth daily.   cetirizine 10 MG tablet Commonly known as: ZYRTEC Take 1 tablet (10 mg total) by mouth daily.   clobetasol 0.05 % topical foam Commonly known as: OLUX 1 application to affected area   coal tar 0.5 % shampoo Commonly known as: NEUTROGENA T-GEL Apply 1 application topically daily.   feeding supplement Liqd Take 237 mLs by mouth 2 (two) times daily between meals. Start taking on: January 07, 2023   Fish Oil 1200 MG Caps Take 1,200 mg by mouth daily.   folic acid 1 MG tablet Commonly known as: FOLVITE Take 1 tablet (1 mg total) by mouth daily. Start taking on:  January 07, 2023   gabapentin 300 MG capsule Commonly known as: NEURONTIN Take 2 capsules (600 mg total) by mouth at bedtime.   hydrocortisone 25 MG suppository Commonly known as: ANUSOL-HC Place 1 suppository (25 mg total) rectally 2 (two) times daily.   hydroxypropyl methylcellulose / hypromellose 2.5 % ophthalmic solution Commonly known as: ISOPTO TEARS / GONIOVISC Place 1 drop into both eyes 3 (three) times daily as needed for dry eyes.   magnesium oxide 400 (240 Mg) MG tablet Commonly known as: MAG-OX Take 2 tablets (800 mg total) by mouth 2 (two) times daily.   methocarbamol 750 MG tablet Commonly known as: ROBAXIN Take 1 tablet (750 mg total) by mouth every 8 (eight) hours as needed for muscle spasms.   multivitamin with minerals Tabs tablet Take 1 tablet by mouth daily. Start taking on: January 07, 2023   nicotine polacrilex 2 MG gum Commonly known as: NICORETTE Take 1 each (2 mg total) by mouth as needed for smoking  cessation.   Oxycodone HCl 10 MG Tabs Take 1 tablet (10 mg total) by mouth every 6 (six) hours as needed for severe pain (severe pain not relieved by tylenol, robaxin).   pantoprazole 40 MG tablet Commonly known as: PROTONIX Take 1 tablet (40 mg total) by mouth daily. Start taking on: January 07, 2023 Replaces: omeprazole 20 MG capsule   thiamine 100 MG tablet Commonly known as: Vitamin B-1 Take 100 mg by mouth daily.       Allergies  Allergen Reactions   Ace Inhibitors Swelling and Other (See Comments)    Angioedema    Furosemide Anaphylaxis   Aspirin Other (See Comments)    "Made my nose bleed"    Follow-up Information     [name], MD. Schedule an appointment as soon as possible for a visit in 2 week(s).   Specialty: Family Medicine Why: Follow-up in 1 to 2 weeks. Contact information: [address] [place] [address] [place] [address] [phone]                  The results of significant diagnostics from this hospitalization (including imaging, microbiology, ancillary and laboratory) are listed below for reference.    Significant Diagnostic Studies: DG Chest 2 View  Result Date: 01/04/2023 CLINICAL DATA:  Chest pain. EXAM: CHEST - 2 VIEW COMPARISON:  Chest radiograph dated 10/21/2022. FINDINGS: Stable chronic radiation changes of the right perihilar region. No new consolidation. There is no pleural effusion or pneumothorax. The cardiac silhouette is within normal limits. No acute osseous pathology. Degenerative changes of the spine. IMPRESSION: 1. No active cardiopulmonary disease. 2. Chronic radiation changes of the right perihilar region. Electronically Signed   By: Elgie Collard M.D.   On: 01/04/2023 20:00    Microbiology: No results found for this or any previous visit (from the past 240 hour(s)).   Labs: Basic Metabolic Panel: Recent Labs  Lab 01/04/23 1952 01/04/23 2153 01/05/23 0436 01/05/23 0437 01/06/23 0352  NA 131*  --   --  137  134*  K 2.5*  --   --  4.6 3.3*  CL 96*  --   --  102 102  CO2 20*  --   --  25 23  GLUCOSE 105*  --   --  84 95  BUN 13  --   --  15 15  CREATININE 1.84*  --   --  1.72* 1.25*  CALCIUM 7.9*  --   --  7.9* 8.2*  MG  --  0.9*  --  1.3* 1.2*  PHOS  --   --  3.0  --   --    Liver Function Tests: Recent Labs  Lab 01/05/23 0437  AST 40  ALT 16  ALKPHOS 134*  BILITOT 0.8  PROT 7.0  ALBUMIN 2.9*   No results for input(s): "LIPASE", "AMYLASE" in the last 168 hours. No results for input(s): "AMMONIA" in the last 168 hours. CBC: Recent Labs  Lab 01/04/23 1952 01/05/23 0436 01/06/23 0948  WBC 4.4 2.8* 2.2*  NEUTROABS  --   --  1.3*  HGB 12.1* 11.5* 11.7*  HCT 35.1* 32.6* 33.1*  MCV 91.4 94.5 94.8  PLT 151 90* 72*   Cardiac Enzymes: No results for input(s): "CKTOTAL", "CKMB", "CKMBINDEX", "TROPONINI" in the last 168 hours. BNP: BNP (last 3 results) No results for input(s): "BNP" in the last 8760 hours.  ProBNP (last 3 results) No results for input(s): "PROBNP" in the last 8760 hours.  CBG: Recent Labs  Lab 01/05/23 1605  GLUCAP 103*       Signed:  Ramiro Harvest MD.  [facility] 01/06/2023, 2:59 PM

## 2023-01-07 ENCOUNTER — Telehealth: Payer: Self-pay

## 2023-01-07 NOTE — Transitions of Care (Post Inpatient/ED Visit) (Signed)
01/07/2023  Name: [name] MRN: [id number] DOB: Nov 03, 1955  Today's TOC FU Call Status: Today's TOC FU Call Status:: Successful TOC FU Call Competed TOC FU Call Complete Date: 01/07/23  Transition Care Management Follow-up Telephone Call Date of Discharge: 01/06/23 Discharge Facility: [facility]) Type of Discharge: Inpatient Admission Primary Inpatient Discharge Diagnosis:: "hypokalemia,AKI" How have you been since you were released from the hospital?: Better (Pt voices he is doing well-rested well-getting ready to eat. Denies any pain/discomfort.) Any questions or concerns?: No  Items Reviewed: Did you receive and understand the discharge instructions provided?: No Medications obtained,verified, and reconciled?: Yes (Medications Reviewed) (Pt states he is going today to ickup new meds from pharmacy and reports that some of his meds filled through [facility] mail order. He was instructed to call back after getting meds if he has any questions/issues) Any new allergies since your discharge?: No Dietary orders reviewed?: Yes Type of Diet Ordered:: low salt/heart healthy Do you have support at home?: Yes People in Home: alone Name of Support/Comfort Primary Source: pt reports he has supportive family nearby that assist him as needed  Medications Reviewed Today: Medications Reviewed Today     Reviewed by Charlyn Minerva, RN (Registered Nurse) on 01/07/23 at 1205  Med List Status: <None>   Medication Order Taking? Sig Documenting Provider Last Dose Status Informant  acetaminophen (TYLENOL) 325 MG tablet [id number]  Take 2 tablets (650 mg total) by mouth every 6 (six) hours as needed for mild pain (or Fever >/= 101). Rodolph Bong, MD  Active   amLODipine (NORVASC) 10 MG tablet [id number] Yes Take 1 tablet (10 mg total) by mouth daily. Rodolph Bong, MD Taking Active   atorvastatin (LIPITOR) 40 MG tablet [id number] Yes Take 1 tablet (40 mg total) by mouth daily.  [name], MD Taking Active Self  cetirizine (ZYRTEC) 10 MG tablet [id number] Yes Take 1 tablet (10 mg total) by mouth daily. [name], MD Taking Active Self  clobetasol (OLUX) 0.05 % topical foam [id number]  1 application to affected area [provider]  Active Self  coal tar (NEUTROGENA T-GEL) 0.5 % shampoo [id number] Yes Apply 1 application topically daily. [provider] Taking Active Self  feeding supplement (ENSURE ENLIVE / ENSURE PLUS) LIQD [id number]  Take 237 mLs by mouth 2 (two) times daily between meals. Rodolph Bong, MD  Active   folic acid (FOLVITE) 1 MG tablet [id number]  Take 1 tablet (1 mg total) by mouth daily. Rodolph Bong, MD  Active   gabapentin (NEURONTIN) 300 MG capsule [id number] Yes Take 2 capsules (600 mg total) by mouth at bedtime. [name], MD Taking Active Self           Med Note Laray Anger Jan 06, 2023 12:08 PM)    hydrocortisone (ANUSOL-HC) 25 MG suppository [id number] No Place 1 suppository (25 mg total) rectally 2 (two) times daily.  Patient not taking: Reported on 01/07/2023   [name], PA-C Not Taking Active Self           Med Note [name], [name]   Wed Oct 20, 2022  3:14 PM) PER [facility], THIS HAS NOT BEEN FILLED IN 24+ MONTHS  hydroxypropyl methylcellulose / hypromellose (ISOPTO TEARS / GONIOVISC) 2.5 % ophthalmic solution [id number]  Place 1 drop into both eyes 3 (three) times daily as needed for dry eyes. [provider]  Active Self  magnesium oxide (MAG-OX) 400 (240 Mg) MG tablet [id number] Yes  Take 2 tablets (800 mg total) by mouth 2 (two) times daily. Rodolph Bong, MD Taking Active   methocarbamol (ROBAXIN) 750 MG tablet [id number] Yes Take 1 tablet (750 mg total) by mouth every 8 (eight) hours as needed for muscle spasms. [name], MD Taking Active Self  Multiple Vitamin (MULTIVITAMIN WITH MINERALS) TABS tablet [id number]  Take 1 tablet by mouth daily. Rodolph Bong,  MD  Active   nicotine polacrilex (NICORETTE) 2 MG gum [id number]  Take 1 each (2 mg total) by mouth as needed for smoking cessation. Rodolph Bong, MD  Active   Omega-3 Fatty Acids (FISH OIL) 1200 MG CAPS [id number] Yes Take 1,200 mg by mouth daily.  [provider] Taking Active Self  oxyCODONE 10 MG TABS [id number]  Take 1 tablet (10 mg total) by mouth every 6 (six) hours as needed for severe pain (severe pain not relieved by tylenol, robaxin). [name], PA-C  Active Self  pantoprazole (PROTONIX) 40 MG tablet [id number]  Take 1 tablet (40 mg total) by mouth daily. Rodolph Bong, MD  Active   thiamine (VITAMIN B-1) 100 MG tablet [id number] Yes Take 100 mg by mouth daily. [provider] Taking Active Self            Home Care and Equipment/Supplies: Were Home Health Services Ordered?: NA Any new equipment or medical supplies ordered?: NA  Functional Questionnaire: Do you need assistance with bathing/showering or dressing?: No Do you need assistance with meal preparation?: No Do you need assistance with eating?: No Do you have difficulty maintaining continence: No Do you need assistance with getting out of bed/getting out of a chair/moving?: No Do you have difficulty managing or taking your medications?: No  Follow up appointments reviewed: PCP Follow-up appointment confirmed?: Yes Date of PCP follow-up appointment?: 01/18/23 Follow-up Provider: Dr. Alvis Lemmings Specialist [facility] Follow-up appointment confirmed?: NA Do you need transportation to your follow-up appointment?: No (pt voices he doesn't drive but family/friends take him to appts-denies needing transportation resources-educated on [employer] transportation) Do you understand care options if your condition(s) worsen?: Yes-patient verbalized understanding  SDOH Interventions Today    Flowsheet Row Most Recent Value  SDOH Interventions   Food Insecurity Interventions Intervention Not Indicated   Transportation Interventions Intervention Not Indicated  [pt reports he does not drive but gets family/friends to take him to appts and run errands-discussed [employer] transportation and instructed pt to call to inquire about coverage]      TOC Interventions Today    Flowsheet Row Most Recent Value  TOC Interventions   TOC Interventions Discussed/Reviewed TOC Interventions Discussed, Arranged PCP follow up less than 12 days/Care Guide scheduled      Interventions Today    Flowsheet Row Most Recent Value  Chronic Disease   Chronic disease during today's visit Hypertension (HTN)  General Interventions   General Interventions Discussed/Reviewed General Interventions Discussed, Doctor Visits, Durable Medical Equipment (DME)  Doctor Visits Discussed/Reviewed PCP, Doctor Visits Discussed  Durable Medical Equipment (DME) BP Cuff  [pt does not have BP machine to monitor BP in the home-encouraged to call [employer] to see if he has any coverage/benefits for one]  PCP/Specialist Visits Compliance with follow-up visit  Education Interventions   Education Provided Provided Education  Provided Verbal Education On Nutrition, When to see the doctor, Medication, Other  [sx mgmt]  Nutrition Interventions   Nutrition Discussed/Reviewed Nutrition Discussed, Adding fruits and vegetables, Increasing proteins, Supplemental nutrition  Pharmacy Interventions   Pharmacy Dicussed/Reviewed Pharmacy Topics Discussed,  Medications and their functions  Safety Interventions   Safety Discussed/Reviewed Safety Discussed       Alessandra Grout [facility] Care Management Care Management Community Coordinator Direct Phone: [phone] Toll Free: [phone] Fax: [phone]

## 2023-01-18 ENCOUNTER — Encounter: Payer: Self-pay | Admitting: Family Medicine

## 2023-01-18 ENCOUNTER — Ambulatory Visit: Payer: Medicare HMO | Attending: Family Medicine | Admitting: Family Medicine

## 2023-01-18 DIAGNOSIS — Z85118 Personal history of other malignant neoplasm of bronchus and lung: Secondary | ICD-10-CM | POA: Diagnosis not present

## 2023-01-18 DIAGNOSIS — R001 Bradycardia, unspecified: Secondary | ICD-10-CM

## 2023-01-18 DIAGNOSIS — E785 Hyperlipidemia, unspecified: Secondary | ICD-10-CM | POA: Diagnosis not present

## 2023-01-18 DIAGNOSIS — I152 Hypertension secondary to endocrine disorders: Secondary | ICD-10-CM | POA: Diagnosis not present

## 2023-01-18 DIAGNOSIS — E1159 Type 2 diabetes mellitus with other circulatory complications: Secondary | ICD-10-CM | POA: Diagnosis not present

## 2023-01-18 DIAGNOSIS — E1169 Type 2 diabetes mellitus with other specified complication: Secondary | ICD-10-CM

## 2023-01-18 DIAGNOSIS — E876 Hypokalemia: Secondary | ICD-10-CM

## 2023-01-18 MED ORDER — ALBUTEROL SULFATE HFA 108 (90 BASE) MCG/ACT IN AERS: 2.0000 | INHALATION_SPRAY | Freq: Four times a day (QID) | RESPIRATORY_TRACT | 2 refills | Status: DC | PRN
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
## 2023-01-18 NOTE — Patient Instructions (Signed)
Managing Your Hypertension Hypertension, also called high blood pressure, is when the force of the blood pressing against the walls of the arteries is too strong. Arteries are blood vessels that carry blood from your heart throughout your body. Hypertension forces the heart to work harder to pump blood and may cause the arteries to become narrow or stiff. Understanding blood pressure readings A blood pressure reading includes a higher number over a lower number: The first, or top, number is called the systolic pressure. It is a measure of the pressure in your arteries as your heart beats. The second, or bottom number, is called the diastolic pressure. It is a measure of the pressure in your arteries as the heart relaxes. For most people, a normal blood pressure is below 120/80. Your personal target blood pressure may vary depending on your medical conditions, your age, and other factors. Blood pressure is classified into four stages. Based on your blood pressure reading, your health care provider may use the following stages to determine what type of treatment you need, if any. Systolic pressure and diastolic pressure are measured in a unit called millimeters of mercury (mmHg). Normal Systolic pressure: below 120. Diastolic pressure: below 80. Elevated Systolic pressure: 120-129. Diastolic pressure: below 80. Hypertension stage 1 Systolic pressure: 130-139. Diastolic pressure: 80-89. Hypertension stage 2 Systolic pressure: 140 or above. Diastolic pressure: 90 or above. How can this condition affect me? Managing your hypertension is very important. Over time, hypertension can damage the arteries and decrease blood flow to parts of the body, including the brain, heart, and kidneys. Having untreated or uncontrolled hypertension can lead to: A heart attack. A stroke. A weakened blood vessel (aneurysm). Heart failure. Kidney damage. Eye damage. Memory and concentration problems. Vascular  dementia. What actions can I take to manage this condition? Hypertension can be managed by making lifestyle changes and possibly by taking medicines. Your health care provider will help you make a plan to bring your blood pressure within a normal range. You may be referred for counseling on a healthy diet and physical activity. Nutrition  Eat a diet that is high in fiber and potassium, and low in salt (sodium), added sugar, and fat. An example eating plan is called the DASH diet. DASH stands for Dietary Approaches to Stop Hypertension. To eat this way: Eat plenty of fresh fruits and vegetables. Try to fill one-half of your plate at each meal with fruits and vegetables. Eat whole grains, such as whole-wheat pasta, brown rice, or whole-grain bread. Fill about one-fourth of your plate with whole grains. Eat low-fat dairy products. Avoid fatty cuts of meat, processed or cured meats, and poultry with skin. Fill about one-fourth of your plate with lean proteins such as fish, chicken without skin, beans, eggs, and tofu. Avoid pre-made and processed foods. These tend to be higher in sodium, added sugar, and fat. Reduce your daily sodium intake. Many people with hypertension should eat less than 1,500 mg of sodium a day. Lifestyle  Work with your health care provider to maintain a healthy body weight or to lose weight. Ask what an ideal weight is for you. Get at least 30 minutes of exercise that causes your heart to beat faster (aerobic exercise) most days of the week. Activities may include walking, swimming, or biking. Include exercise to strengthen your muscles (resistance exercise), such as weight lifting, as part of your weekly exercise routine. Try to do these types of exercises for 30 minutes at least 3 days a week. Do   not use any products that contain nicotine or tobacco. These products include cigarettes, chewing tobacco, and vaping devices, such as e-cigarettes. If you need help quitting, ask your  health care provider. Control any long-term (chronic) conditions you have, such as high cholesterol or diabetes. Identify your sources of stress and find ways to manage stress. This may include meditation, deep breathing, or making time for fun activities. Alcohol use Do not drink alcohol if: Your health care provider tells you not to drink. You are pregnant, may be pregnant, or are planning to become pregnant. If you drink alcohol: Limit how much you have to: 0-1 drink a day for women. 0-2 drinks a day for men. Know how much alcohol is in your drink. In the [place] one drink equals one 12 oz bottle of beer (355 mL), one 5 oz glass of wine (148 mL), or one 1 oz glass of hard liquor (44 mL). Medicines Your health care provider may prescribe medicine if lifestyle changes are not enough to get your blood pressure under control and if: Your systolic blood pressure is 130 or higher. Your diastolic blood pressure is 80 or higher. Take medicines only as told by your health care provider. Follow the directions carefully. Blood pressure medicines must be taken as told by your health care provider. The medicine does not work as well when you skip doses. Skipping doses also puts you at risk for problems. Monitoring Before you monitor your blood pressure: Do not smoke, drink caffeinated beverages, or exercise within 30 minutes before taking a measurement. Use the bathroom and empty your bladder (urinate). Sit quietly for at least 5 minutes before taking measurements. Monitor your blood pressure at home as told by your health care provider. To do this: Sit with your back straight and supported. Place your feet flat on the floor. Do not cross your legs. Support your arm on a flat surface, such as a table. Make sure your upper arm is at heart level. Each time you measure, take two or three readings one minute apart and record the results. You may also need to have your blood pressure checked regularly by  your health care provider. General information Talk with your health care provider about your diet, exercise habits, and other lifestyle factors that may be contributing to hypertension. Review all the medicines you take with your health care provider because there may be side effects or interactions. Keep all follow-up visits. Your health care provider can help you create and adjust your plan for managing your high blood pressure. Where to find more information National Heart, Lung, and Blood Institute: www.nhlbi.nih.gov American Heart Association: www.heart.org Contact a health care provider if: You think you are having a reaction to medicines you have taken. You have repeated (recurrent) headaches. You feel dizzy. You have swelling in your ankles. You have trouble with your vision. Get help right away if: You develop a severe headache or confusion. You have unusual weakness or numbness, or you feel faint. You have severe pain in your chest or abdomen. You vomit repeatedly. You have trouble breathing. These symptoms may be an emergency. Get help right away. Call 911. Do not wait to see if the symptoms will go away. Do not drive yourself to the hospital. Summary Hypertension is when the force of blood pumping through your arteries is too strong. If this condition is not controlled, it may put you at risk for serious complications. Your personal target blood pressure may vary depending on your medical conditions,   your age, and other factors. For most people, a normal blood pressure is less than 120/80. Hypertension is managed by lifestyle changes, medicines, or both. Lifestyle changes to help manage hypertension include losing weight, eating a healthy, low-sodium diet, exercising more, stopping smoking, and limiting alcohol. This information is not intended to replace advice given to you by your health care provider. Make sure you discuss any questions you have with your health care  provider. Document Revised: 03/05/2021 Document Reviewed: 03/05/2021 Elsevier Patient Education  2024 [employer]  

## 2023-01-19 ENCOUNTER — Other Ambulatory Visit: Payer: Self-pay | Admitting: Family Medicine

## 2023-01-19 LAB — BASIC METABOLIC PANEL
BUN/Creatinine Ratio: 17 (ref 10–24)
BUN: 21 mg/dL (ref 8–27)
CO2: 23 mmol/L (ref 20–29)
Calcium: 9.6 mg/dL (ref 8.6–10.2)
Chloride: 98 mmol/L (ref 96–106)
Creatinine, Ser: 1.25 mg/dL (ref 0.76–1.27)
Glucose: 90 mg/dL (ref 70–99)
Potassium: 4.4 mmol/L (ref 3.5–5.2)
Sodium: 135 mmol/L (ref 134–144)
eGFR: 63 mL/min/{1.73_m2} (ref >59)

## 2023-01-19 LAB — MAGNESIUM: Magnesium: 1.1 mg/dL — ABNORMAL LOW (ref 1.6–2.3)

## 2023-02-01 ENCOUNTER — Ambulatory Visit: Payer: Medicare HMO
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
## 2023-02-18 ENCOUNTER — Ambulatory Visit: Payer: Medicare HMO | Attending: Family Medicine | Admitting: Pharmacist

## 2023-02-18 ENCOUNTER — Encounter: Payer: Self-pay | Admitting: Pharmacist

## 2023-02-18 VITALS — BP 130/79 | HR 90

## 2023-02-18 DIAGNOSIS — I10 Essential (primary) hypertension: Secondary | ICD-10-CM | POA: Diagnosis not present

## 2023-02-18 NOTE — Progress Notes (Signed)
S:     No chief complaint on file.  67 y.o. male who presents for hypertension evaluation, education, and management.  PMH is significant for type 2 diabetes mellitus (A1c 4.2, on diet control), GERD, Gout , alcohol abuse, stage III  Non small cell squamous cell carcinoma of the right lung (completed chemoradiation with Carboplatin and Paclitaxel x 5 cycles with partial response, completed immunotherapy with Imfinzi), psoriasis   Patient was referred and last seen by Primary Care Provider, Dr. Alvis Lemmings, on 01/18/2023.   Today, patient arrives in good spirits and presents without assistance. Denies dizziness, headache, blurred vision, swelling. Patient reports hypertension is longstanding. Of note, he was admitted 7/2-01/06/23 with AKI and bradycardia. Found also to have low potassium and magnesium levels. Since then, HR has been normal. He is taking amlodipine as instructed. Denies any side effects. No symptoms of hypotension or bradycardia currently.   Family/Social history:  -Fhx: DM, CHF  -Tobacco: still smoking. Currently at 0.3 PPD.  -Alcohol: none reported.  Medication adherence reported. Patient has taken BP medications today.   Current antihypertensives include: amlodipine 10 mg   Reported home BP readings: none  Patient reported dietary habits:  -Limits his sodium -Denies drinking excessive caffeine   Patient-reported exercise habits: tries to stay active.   O:  Vitals:   02/18/23 0959  BP: 130/79  Pulse: 90     Last 3 Office BP readings: BP Readings from Last 3 Encounters:  02/18/23 130/79  01/18/23 (!) 158/95  01/06/23 (!) 155/96    BMET    Component Value Date/Time   NA 135 01/18/2023 1228   NA 137 07/07/2017 0839   K 4.4 01/18/2023 1228   K 3.4 (L) 07/07/2017 0839   CL 98 01/18/2023 1228   CO2 23 01/18/2023 1228   CO2 21 (L) 07/07/2017 0839   GLUCOSE 90 01/18/2023 1228   GLUCOSE 95 01/06/2023 0352   GLUCOSE 154 (H) 07/07/2017 0839   BUN 21  01/18/2023 1228   BUN 17.0 07/07/2017 0839   CREATININE 1.25 01/18/2023 1228   CREATININE 1.52 (H) 10/04/2022 0923   CREATININE 1.2 07/07/2017 0839   CALCIUM 9.6 01/18/2023 1228   CALCIUM 8.9 07/07/2017 0839   GFRNONAA >60 01/06/2023 0352   GFRNONAA 50 (L) 10/04/2022 0923   GFRNONAA 64 02/09/2016 0949   GFRAA 58 (L) 02/16/2020 0323   GFRAA 58 (L) 11/23/2019 1256   GFRAA 74 02/09/2016 0949    Renal function: CrCl cannot be calculated (Patient's most recent lab result is older than the maximum 21 days allowed.).  Clinical ASCVD: No  The ASCVD Risk score (Arnett DK, et al., 2019) failed to calculate for the following reasons:   The valid total cholesterol range is 130 to 320 mg/dL  Patient is participating in a Managed Medicaid Plan: no    A/P: Hypertension diagnosed currently at goal on current medications. BP goal < 130/80 mmHg. Medication adherence appears appropriate.  -Continued amlodipine 10 mg daily.   -Patient educated on purpose, proper use, and potential adverse effects of amlodipine.  -F/u labs ordered - none -Counseled on lifestyle modifications for blood pressure control including reduced dietary sodium, increased exercise, adequate sleep. -Encouraged patient to check BP at home and bring log of readings to next visit. Counseled on proper use of home BP cuff.   Results reviewed and written information provided.    Written patient instructions provided. Patient verbalized understanding of treatment plan.  Total time in face to face counseling 20 minutes.  Follow-up:  Pharmacist in 4-6 weeks if needed. PCP clinic visit 04/20/2023.   Butch Penny, PharmD, P[place]y [place], CPP Clinical Pharmacist [facility] [phone]

## 2023-02-23 DIAGNOSIS — L209 Atopic dermatitis, unspecified: Secondary | ICD-10-CM | POA: Diagnosis not present

## 2023-02-23 DIAGNOSIS — L409 Psoriasis, unspecified: Secondary | ICD-10-CM | POA: Diagnosis not present

## 2023-03-14 ENCOUNTER — Encounter: Payer: Medicare HMO | Admitting: Internal Medicine
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
## 2023-03-29 NOTE — Progress Notes (Unsigned)
Cardiology Office Note:  .   Date:  03/29/2023  ID:  [name], DOB [birth year], MRN [id number] PCP: [name], MD  [facility] Providers Cardiologist:  None { Click to update primary MD,subspecialty MD or APP then REFRESH:1}   History of Present Illness: .   [name] is a 67 y.o. male hx of HTN, ETOH abuse, chronic pancytopenia, stage III SCC R lung, fall/PTX, he was sent to the ER in July 2024 because his HR was noted to be down to 30 prior to cataract surgery. He had no significant bradycardia in the ER. He was managed for an AKI, hypokalemia, low mag.  Of note, he is s/p chemoradiation for RLL SCC. Did receive ICI in 2019. He is on observation now. Today,   ROS: ***  Studies Reviewed: [name]        ECG 01/05/2023-Sinus rhythm with PACs  Risk Assessment/Calculations:        Physical Exam:   VS:  *** Wt Readings from Last 3 Encounters:  01/18/23 182 lb 6.4 oz (82.7 kg)  01/06/23 179 lb 7.3 oz (81.4 kg)  11/10/22 183 lb 9.6 oz (83.3 kg)    GEN: Well nourished, well developed in no acute distress NECK: No JVD; No carotid bruits CARDIAC: ***RRR, no murmurs, rubs, gallops RESPIRATORY:  Clear to auscultation without rales, wheezing or rhonchi  ABDOMEN: Soft, non-tender, non-distended EXTREMITIES:  No edema; No deformity   ASSESSMENT AND PLAN: .   Bradycardia No signs of AV block. S/p electrolyte abnormalities while NPO. No indication for device or further w/u at this time     Dispo: Follow up PRN  Signed, Maisie Fus, MD

## 2023-03-30 ENCOUNTER — Encounter: Payer: Self-pay | Admitting: Internal Medicine

## 2023-03-30 ENCOUNTER — Ambulatory Visit: Payer: Medicare HMO | Attending: Internal Medicine | Admitting: Internal Medicine

## 2023-03-30 VITALS — BP 122/80 | HR 78 | Ht 74.0 in | Wt 189.4 lb

## 2023-03-30 DIAGNOSIS — I495 Sick sinus syndrome: Secondary | ICD-10-CM | POA: Diagnosis not present

## 2023-03-30 NOTE — Patient Instructions (Signed)
Medication Instructions:  Continue same medications   Lab Work: None ordered   Testing/Procedures: None ordered   Follow-Up: At [facility], you and your health needs are our priority.  As part of our continuing mission to provide you with exceptional heart care, we have created designated Provider Care Teams.  These Care Teams include your primary Cardiologist (physician) and Advanced Practice Providers (APPs -  Physician Assistants and Nurse Practitioners) who all work together to provide you with the care you need, when you need it.  We recommend signing up for the patient portal called "MyChart".  Sign up information is provided on this After Visit Summary.  MyChart is used to connect with patients for Virtual Visits (Telemedicine).  Patients are able to view lab/test results, encounter notes, upcoming appointments, etc.  Non-urgent messages can be sent to your provider as well.   To learn more about what you can do with MyChart, go to [email or web address].    Your next appointment:  As Needed    Provider:  Dr.Branch

## 2023-04-18 DIAGNOSIS — H40051 Ocular hypertension, right eye: Secondary | ICD-10-CM | POA: Diagnosis not present

## 2023-04-18 DIAGNOSIS — H18231 Secondary corneal edema, right eye: Secondary | ICD-10-CM | POA: Diagnosis not present

## 2023-04-18 DIAGNOSIS — H338 Other retinal detachments: Secondary | ICD-10-CM | POA: Diagnosis not present

## 2023-04-18 DIAGNOSIS — H4051X3 Glaucoma secondary to other eye disorders, right eye, severe stage: Secondary | ICD-10-CM | POA: Diagnosis not present

## 2023-04-18 DIAGNOSIS — H35362 Drusen (degenerative) of macula, left eye: Secondary | ICD-10-CM | POA: Diagnosis not present

## 2023-04-18 DIAGNOSIS — H53411 Scotoma involving central area, right eye: Secondary | ICD-10-CM | POA: Diagnosis not present

## 2023-04-20 ENCOUNTER — Ambulatory Visit: Payer: Self-pay | Admitting: Family Medicine

## 2023-05-13 ENCOUNTER — Other Ambulatory Visit: Payer: Self-pay | Admitting: Family Medicine

## 2023-05-13 NOTE — Telephone Encounter (Signed)
Requested medication (s) are due for refill today - yes  Requested medication (s) are on the active medication list -yes  Future visit scheduled -no  Last refill: 12/03/22 #90 1RF  Notes to clinic: non delegated Rx  Requested Prescriptions  Pending Prescriptions Disp Refills   methocarbamol (ROBAXIN) 750 MG tablet [Pharmacy Med Name: Methocarbamol Oral Tablet 750 MG] 90 tablet 11    Sig: Take 1 tablet (750 mg total) by mouth every 8 (eight) hours as needed for muscle spasms.     Not Delegated - Analgesics:  Muscle Relaxants Failed - 05/13/2023 11:26 AM      Failed - This refill cannot be delegated      Passed - Valid encounter within last 6 months    Recent Outpatient Visits           2 months ago Essential hypertension   [place] [facility] [place] Dept Of [place]. [facility] Drucilla Chalet, RPH-CPP   3 months ago Hypomagnesemia   [place] [facility] [place] [facility] A Dept Of [place]. [facility] [name], MD   5 months ago Medication management   [place] [facility] [place] - A Dept Of [place]. [facility] Lois Huxley, Kila L, RPH-CPP   6 months ago Closed fracture of one rib of left side with routine healing, subsequent encounter   [place] [facility] - A Dept Of [place]. [facility], Marylene Land M, [place]   10 months ago Chronic gout without tophus, unspecified cause, unspecified site   [facility] - A Dept Of [place]. [facility] [name], MD                 Requested Prescriptions  Pending Prescriptions Disp Refills   methocarbamol (ROBAXIN) 750 MG tablet [Pharmacy Med Name: Methocarbamol Oral Tablet 750 MG] 90 tablet 11    Sig: Take 1 tablet (750 mg total) by mouth every 8 (eight) hours as needed for muscle spasms.     Not Delegated - Analgesics:  Muscle Relaxants Failed - 05/13/2023 11:26 AM      Failed - This refill cannot be delegated       Passed - Valid encounter within last 6 months    Recent Outpatient Visits           2 months ago Essential hypertension   [place] [facility] [place] - [facility] Dept Of [place]. [facility] Drucilla Chalet, RPH-CPP   3 months ago Hypomagnesemia   [place] [facility] [place] - A Dept Of [place]. [facility] [name], MD   5 months ago Medication management   [place] [facility] [place] - A Dept Of [place]. [facility] Lois Huxley, Lorenzo L, RPH-CPP   6 months ago Closed fracture of one rib of left side with routine healing, subsequent encounter   [place] [facility] - A Dept Of [place]. [facility], Marylene Land M, [place]   10 months ago Chronic gout without tophus, unspecified cause, unspecified site   [facility] - A Dept Of [place]. [facility] [name], MD

## 2023-05-23 DIAGNOSIS — H35372 Puckering of macula, left eye: Secondary | ICD-10-CM | POA: Diagnosis not present

## 2023-05-23 DIAGNOSIS — H2701 Aphakia, right eye: Secondary | ICD-10-CM | POA: Diagnosis not present

## 2023-05-23 DIAGNOSIS — H43391 Other vitreous opacities, right eye: Secondary | ICD-10-CM | POA: Diagnosis not present

## 2023-05-23 DIAGNOSIS — Z8669 Personal history of other diseases of the nervous system and sense organs: Secondary | ICD-10-CM | POA: Diagnosis not present

## 2023-05-23 DIAGNOSIS — H59811 Chorioretinal scars after surgery for detachment, right eye: Secondary | ICD-10-CM | POA: Diagnosis not present

## 2023-06-10 ENCOUNTER — Emergency Department (HOSPITAL_COMMUNITY)
Admission: EM | Admit: 2023-06-10 | Discharge: 2023-06-11 | Disposition: A | Discharge status: Home / Self Care | Payer: Medicare HMO | Attending: Emergency Medicine | Admitting: Emergency Medicine

## 2023-06-10 ENCOUNTER — Other Ambulatory Visit: Payer: Self-pay

## 2023-06-10 ENCOUNTER — Encounter (HOSPITAL_COMMUNITY): Payer: Self-pay

## 2023-06-10 DIAGNOSIS — Z79899 Other long term (current) drug therapy: Secondary | ICD-10-CM | POA: Insufficient documentation

## 2023-06-10 DIAGNOSIS — E119 Type 2 diabetes mellitus without complications: Secondary | ICD-10-CM | POA: Insufficient documentation

## 2023-06-10 DIAGNOSIS — S0181XA Laceration without foreign body of other part of head, initial encounter: Secondary | ICD-10-CM | POA: Diagnosis not present

## 2023-06-10 DIAGNOSIS — R040 Epistaxis: Secondary | ICD-10-CM | POA: Insufficient documentation

## 2023-06-10 DIAGNOSIS — I10 Essential (primary) hypertension: Secondary | ICD-10-CM | POA: Insufficient documentation

## 2023-06-10 NOTE — ED Triage Notes (Signed)
C/o picking a scab on left outer nose and started bleeding. Pressure x2 hours per patient.  Denies blood thinner usage.

## 2023-06-11 NOTE — ED Provider Notes (Signed)
[place] [facility] EMERGENCY DEPARTMENT AT [facility] Provider Note   CSN: [id number] Arrival date & time: 06/10/23  2215     History  Chief Complaint  Patient presents with   Facial Laceration    [name] is a 67 y.o. male.  HPI Patient presents for bleeding from his nose.  Patient reports there was a scab on the left side of his nose that he picked off and has been bleeding for over 2 hours.  He reports he cannot get it to stop.  Patient denies any anticoagulation use   Past Medical History:  Diagnosis Date   Acid reflux    Arthritis    Diabetes mellitus    Diverticulosis 2014   seen on colonoscopy   Drug-induced skin rash 03/17/2017   Encounter for antineoplastic chemotherapy 10/09/2016   Encounter for antineoplastic immunotherapy 12/21/2016   Encounter for smoking cessation counseling 08/10/2016   GERD (gastroesophageal reflux disease)    Goals of care, counseling/discussion 10/09/2016   Gout    History of kidney stones    Hyperlipidemia    Hypertension    Incarcerated ventral hernia    Mouth bleeding    upper teeth right back side loose and bleed at night   Pancreatitis    Rectal bleeding    "intermittently for years"   Stage III squamous cell carcinoma of right lung (HCC) 10/07/2016    Home Medications Prior to Admission medications   Medication Sig Start Date End Date Taking? Authorizing Provider  acetaminophen (TYLENOL) 325 MG tablet Take 2 tablets (650 mg total) by mouth every 6 (six) hours as needed for mild pain (or Fever >/= 101). 01/06/23   Rodolph Bong, MD  albuterol (VENTOLIN HFA) 108 (90 Base) MCG/ACT inhaler Inhale 2 puffs into the lungs every 6 (six) hours as needed for wheezing or shortness of breath. 01/18/23   [name], MD  amLODipine (NORVASC) 10 MG tablet Take 1 tablet (10 mg total) by mouth daily. 01/06/23 02/05/23  Rodolph Bong, MD  atorvastatin (LIPITOR) 40 MG tablet Take 1 tablet (40 mg total) by mouth daily. 11/24/22    [name], MD  cetirizine (ZYRTEC) 10 MG tablet Take 1 tablet (10 mg total) by mouth daily. 12/31/21   [name], MD  clobetasol (OLUX) 0.05 % topical foam 1 application to affected area    [provider]  coal tar (NEUTROGENA T-GEL) 0.5 % shampoo Apply 1 application topically daily.    [provider]  feeding supplement (ENSURE ENLIVE / ENSURE PLUS) LIQD Take 237 mLs by mouth 2 (two) times daily between meals. 01/07/23   Rodolph Bong, MD  folic acid (FOLVITE) 1 MG tablet Take 1 tablet (1 mg total) by mouth daily. 01/07/23   Rodolph Bong, MD  gabapentin (NEURONTIN) 300 MG capsule Take 2 capsules (600 mg total) by mouth at bedtime. 12/31/21   [name], MD  hydrocortisone (ANUSOL-HC) 25 MG suppository Place 1 suppository (25 mg total) rectally 2 (two) times daily. 02/16/20   [name], PA-C  hydroxypropyl methylcellulose / hypromellose (ISOPTO TEARS / GONIOVISC) 2.5 % ophthalmic solution Place 1 drop into both eyes 3 (three) times daily as needed for dry eyes.    [provider]  magnesium oxide (MAG-OX) 400 (240 Mg) MG tablet Take 2 tablets (800 mg total) by mouth 2 (two) times daily. 01/06/23   Rodolph Bong, MD  methocarbamol (ROBAXIN) 750 MG tablet Take 1 tablet (750 mg total) by mouth every  8 (eight) hours as needed for muscle spasms. Must have office visit for refills 05/13/23   [name], MD  Multiple Vitamin (MULTIVITAMIN WITH MINERALS) TABS tablet Take 1 tablet by mouth daily. 01/07/23   Rodolph Bong, MD  nicotine polacrilex (NICORETTE) 2 MG gum Take 1 each (2 mg total) by mouth as needed for smoking cessation. 01/06/23   Rodolph Bong, MD  Omega-3 Fatty Acids (FISH OIL) 1200 MG CAPS Take 1,200 mg by mouth daily.     [provider]  oxyCODONE 10 MG TABS Take 1 tablet (10 mg total) by mouth every 6 (six) hours as needed for severe pain (severe pain not relieved by tylenol, robaxin). 10/21/22   [name], PA-C  pantoprazole (PROTONIX) 40 MG tablet Take 1 tablet (40 mg total) by mouth daily. 01/07/23   Rodolph Bong, MD  thiamine (VITAMIN B-1) 100 MG tablet Take 100 mg by mouth daily.    [provider]      Allergies    Ace inhibitors, Furosemide, and Aspirin    Review of Systems   Review of Systems  Skin:  Positive for wound.    Physical Exam Updated Vital Signs BP (!) 173/115 (BP Location: Right Arm)   Pulse 79   Temp 97.6 F (36.4 C) (Oral)   Resp 16   Wt 85 kg   SpO2 100%   BMI 24.06 kg/m  Physical Exam CONSTITUTIONAL: Elderly chronically ill-appearing HEAD: Normocephalic/atraumatic EYES: EOMI/PERRL ENMT: Mucous membranes moist Small pinpoint area on the left nare that is actively bleeding, no blood inside the nose NEURO: Pt is awake/alert/appropriate, moves all extremitiesx4.  No facial droop.    ED Results / Procedures / Treatments   Labs (all labs ordered are listed, but only abnormal results are displayed) Labs Reviewed - No data to display  EKG None  Radiology No results found.  Procedures Procedures    Medications Ordered in ED Medications - No data to display  ED Course/ Medical Decision Making/ A&P Clinical Course as of 06/11/23 0254  Sat Jun 11, 2023  0220 Patient with history of pancytopenia including thrombocytopenia presents with bleeding wound from his nare.  patient is placing direct pressure and silver nitrate was applied to the area [DW]    Clinical Course User Index [DW] Zadie Rhine, MD                                 Medical Decision Making  Patient obtained hemostasis with silver nitrate and pressure.  Advised patient to keep bandage on for least 24-48 hours and then remove it and he can gently clean the wound  Patient agreeable with plan        Final Clinical Impression(s) / ED Diagnoses Final diagnoses:  None    Rx / DC Orders ED Discharge Orders     None         Zadie Rhine,  MD 06/11/23 [phone]

## 2023-06-11 NOTE — Discharge Instructions (Signed)
Keep bandage on for 48 hours Then you can remove the bandage and clean off the wound If it begins to bleed, hold pressure for 15 minutes

## 2023-06-13 ENCOUNTER — Other Ambulatory Visit: Payer: Self-pay | Admitting: Family Medicine

## 2023-06-21 DIAGNOSIS — H43311 Vitreous membranes and strands, right eye: Secondary | ICD-10-CM | POA: Diagnosis not present

## 2023-06-21 DIAGNOSIS — H43391 Other vitreous opacities, right eye: Secondary | ICD-10-CM | POA: Diagnosis not present

## 2023-06-21 DIAGNOSIS — H2701 Aphakia, right eye: Secondary | ICD-10-CM | POA: Diagnosis not present

## 2023-06-30 ENCOUNTER — Other Ambulatory Visit: Payer: Self-pay | Admitting: Family Medicine

## 2023-06-30 DIAGNOSIS — K219 Gastro-esophageal reflux disease without esophagitis: Secondary | ICD-10-CM

## 2023-07-05 ENCOUNTER — Other Ambulatory Visit: Payer: Self-pay | Admitting: Family Medicine

## 2023-07-05 MED ORDER — AMLODIPINE BESYLATE 10 MG PO TABS: 10.0000 mg | ORAL_TABLET | Freq: Every day | ORAL | 0 refills | Status: DC

## 2023-07-27 ENCOUNTER — Other Ambulatory Visit: Payer: Self-pay | Admitting: Family Medicine

## 2023-07-27 NOTE — Telephone Encounter (Signed)
Requested medication (s) are due for refill today: Yes  Requested medication (s) are on the active medication list: No  Last refill:  unknown  Future visit scheduled: No  Notes to clinic:  medication not on current medication list     Requested Prescriptions  Pending Prescriptions Disp Refills   fluticasone (FLONASE) 50 MCG/ACT nasal spray [Pharmacy Med Name: Fluticasone Propionate Nasal Suspension 50 MCG/ACT] 32 g 3    Sig: SHAKE LIQUID AND USE 1 SPRAY IN EACH NOSTRIL DAILY     Ear, Nose, and Throat: Nasal Preparations - Corticosteroids Passed - 07/27/2023 12:04 PM      Passed - Valid encounter within last 12 months    Recent Outpatient Visits           5 months ago Essential hypertension   [place] [facility] [place] [facility] Dept Of [place]. [facility] Drucilla Chalet, RPH-CPP   6 months ago Hypomagnesemia   [place] [facility] [place] [facility] Dept Of [place]. [facility] [name], MD   8 months ago Medication management   [place] [facility] [place] - A Dept Of [place]. [facility] Lois Huxley, [place] L, RPH-CPP   8 months ago Closed fracture of one rib of left side with routine healing, subsequent encounter   [place] [facility] - [facility] Dept Of [place]. [facility] [place], [place], [place]   1 year ago Chronic gout without tophus, unspecified cause, unspecified site   [facility] - A Dept Of [place]. [facility] [name], MD

## 2023-08-05 ENCOUNTER — Other Ambulatory Visit: Payer: Self-pay | Admitting: Family Medicine

## 2023-08-05 DIAGNOSIS — K219 Gastro-esophageal reflux disease without esophagitis: Secondary | ICD-10-CM

## 2023-08-05 NOTE — Telephone Encounter (Signed)
Last Fill: Prilosec: 07/01/23     Robaxin: 05/13/23  Last OV: 01/18/23 Next OV: None Scheduled  Routing to provider for review/authorization.

## 2023-08-05 NOTE — Telephone Encounter (Unsigned)
Copied from CRM [id number]. Topic: Clinical - Medication Refill >> Aug 05, 2023  3:22 PM Elle L wrote: Most Recent Primary Care Visit:  Provider: Drucilla Chalet  Department[facility]  Visit Type: OFFICE VISIT  Date: 02/18/2023  Medication: omeprazole (PRILOSEC) 20 MG capsule AND methocarbamol (ROBAXIN) 750 MG tablet  Has the patient contacted their pharmacy? Yes, the pharmacy called on behalf of the patient.  Is this the correct pharmacy for this prescription? Yes  [facility] - [place], [place] - [address] [place] [address] Phone: [phone] Fax: [phone]  Has the prescription been filled recently? No  Is the patient out of the medication? Yes  Has the patient been seen for an appointment in the last year OR does the patient have an upcoming appointment? Yes  Can we respond through MyChart? No  Agent: Please be advised that Rx refills may take up to 3 business days. We ask that you follow-up with your pharmacy.

## 2023-08-06 ENCOUNTER — Other Ambulatory Visit: Payer: Self-pay | Admitting: Family Medicine

## 2023-08-06 DIAGNOSIS — K219 Gastro-esophageal reflux disease without esophagitis: Secondary | ICD-10-CM

## 2023-08-08 MED ORDER — OMEPRAZOLE 20 MG PO CPDR
DELAYED_RELEASE_CAPSULE | ORAL | 0 refills | Status: DC
Start: 2023-08-08 — End: 2024-01-26

## 2023-08-08 MED ORDER — METHOCARBAMOL 750 MG PO TABS: 750.0000 mg | ORAL_TABLET | Freq: Three times a day (TID) | ORAL | 0 refills | Status: DC | PRN

## 2023-09-15 DIAGNOSIS — E113292 Type 2 diabetes mellitus with mild nonproliferative diabetic retinopathy without macular edema, left eye: Secondary | ICD-10-CM | POA: Diagnosis not present

## 2023-09-15 DIAGNOSIS — H0102B Squamous blepharitis left eye, upper and lower eyelids: Secondary | ICD-10-CM | POA: Diagnosis not present

## 2023-09-15 DIAGNOSIS — H31011 Macula scars of posterior pole (postinflammatory) (post-traumatic), right eye: Secondary | ICD-10-CM | POA: Diagnosis not present

## 2023-09-15 DIAGNOSIS — H0102A Squamous blepharitis right eye, upper and lower eyelids: Secondary | ICD-10-CM | POA: Diagnosis not present

## 2023-09-15 DIAGNOSIS — Z961 Presence of intraocular lens: Secondary | ICD-10-CM | POA: Diagnosis not present

## 2023-09-15 DIAGNOSIS — H40023 Open angle with borderline findings, high risk, bilateral: Secondary | ICD-10-CM | POA: Diagnosis not present

## 2023-09-15 DIAGNOSIS — H04123 Dry eye syndrome of bilateral lacrimal glands: Secondary | ICD-10-CM | POA: Diagnosis not present

## 2023-09-22 ENCOUNTER — Other Ambulatory Visit: Payer: Self-pay | Admitting: Family Medicine

## 2023-10-03 ENCOUNTER — Other Ambulatory Visit: Payer: Self-pay | Admitting: Family Medicine

## 2023-10-03 DIAGNOSIS — K219 Gastro-esophageal reflux disease without esophagitis: Secondary | ICD-10-CM

## 2023-10-21 ENCOUNTER — Telehealth: Payer: Self-pay | Admitting: Internal Medicine

## 2023-10-21 NOTE — Telephone Encounter (Signed)
 Rescheduled appointments due to being too close to a scan. Attempted to contact the patient,son and daughter. All of their voicemail's were full. Will be sending an appointment reminder in the mail.

## 2023-10-25 ENCOUNTER — Telehealth: Payer: Self-pay | Admitting: Internal Medicine

## 2023-10-25 NOTE — Telephone Encounter (Signed)
 Confirmed appointments with the daughter. She will make the patient aware of the changes made.

## 2023-10-31 ENCOUNTER — Inpatient Hospital Stay: Payer: Medicare HMO | Attending: Physician Assistant

## 2023-10-31 ENCOUNTER — Encounter (HOSPITAL_COMMUNITY): Payer: Self-pay

## 2023-10-31 ENCOUNTER — Ambulatory Visit (HOSPITAL_COMMUNITY)
Admission: RE | Admit: 2023-10-31 | Discharge: 2023-10-31 | Disposition: A | Discharge status: Home / Self Care | Source: Ambulatory Visit | Attending: Internal Medicine | Admitting: Internal Medicine

## 2023-10-31 DIAGNOSIS — R918 Other nonspecific abnormal finding of lung field: Secondary | ICD-10-CM | POA: Diagnosis not present

## 2023-10-31 DIAGNOSIS — Z85118 Personal history of other malignant neoplasm of bronchus and lung: Secondary | ICD-10-CM | POA: Insufficient documentation

## 2023-10-31 DIAGNOSIS — J432 Centrilobular emphysema: Secondary | ICD-10-CM | POA: Diagnosis not present

## 2023-10-31 DIAGNOSIS — D61818 Other pancytopenia: Secondary | ICD-10-CM | POA: Diagnosis not present

## 2023-10-31 DIAGNOSIS — C3490 Malignant neoplasm of unspecified part of unspecified bronchus or lung: Secondary | ICD-10-CM | POA: Insufficient documentation

## 2023-10-31 LAB — CBC WITH DIFFERENTIAL (CANCER CENTER ONLY)
Abs Immature Granulocytes: 0.01 10*3/uL (ref 0.00–0.07)
Basophils Absolute: 0 10*3/uL (ref 0.0–0.1)
Basophils Relative: 2 %
Eosinophils Absolute: 0 10*3/uL (ref 0.0–0.5)
Eosinophils Relative: 1 %
HCT: 35.2 % — ABNORMAL LOW (ref 39.0–52.0)
Hemoglobin: 12.4 g/dL — ABNORMAL LOW (ref 13.0–17.0)
Immature Granulocytes: 0 %
Lymphocytes Relative: 32 %
Lymphs Abs: 0.8 10*3/uL (ref 0.7–4.0)
MCH: 32 pg (ref 26.0–34.0)
MCHC: 35.2 g/dL (ref 30.0–36.0)
MCV: 90.7 fL (ref 80.0–100.0)
Monocytes Absolute: 0.3 10*3/uL (ref 0.1–1.0)
Monocytes Relative: 13 %
Neutro Abs: 1.3 10*3/uL — ABNORMAL LOW (ref 1.7–7.7)
Neutrophils Relative %: 52 %
Platelet Count: 130 10*3/uL — ABNORMAL LOW (ref 150–400)
RBC: 3.88 MIL/uL — ABNORMAL LOW (ref 4.22–5.81)
RDW: 18.1 % — ABNORMAL HIGH (ref 11.5–15.5)
WBC Count: 2.5 10*3/uL — ABNORMAL LOW (ref 4.0–10.5)
nRBC: 0 % (ref 0.0–0.2)

## 2023-10-31 LAB — CMP (CANCER CENTER ONLY)
ALT: 11 U/L (ref 0–44)
AST: 29 U/L (ref 15–41)
Albumin: 3.7 g/dL (ref 3.5–5.0)
Alkaline Phosphatase: 144 U/L — ABNORMAL HIGH (ref 38–126)
Anion gap: 8 (ref 5–15)
BUN: 19 mg/dL (ref 8–23)
CO2: 30 mmol/L (ref 22–32)
Calcium: 8.9 mg/dL (ref 8.9–10.3)
Chloride: 97 mmol/L — ABNORMAL LOW (ref 98–111)
Creatinine: 1.19 mg/dL (ref 0.61–1.24)
GFR, Estimated: >60 mL/min (ref >60)
Glucose, Bld: 85 mg/dL (ref 70–99)
Potassium: 3.6 mmol/L (ref 3.5–5.1)
Sodium: 135 mmol/L (ref 135–145)
Total Bilirubin: 0.9 mg/dL (ref 0.0–1.2)
Total Protein: 8.7 g/dL — ABNORMAL HIGH (ref 6.5–8.1)

## 2023-11-02 ENCOUNTER — Ambulatory Visit: Payer: Medicare HMO | Admitting: Physician Assistant
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
## 2023-11-09 ENCOUNTER — Inpatient Hospital Stay: Attending: Physician Assistant | Admitting: Physician Assistant

## 2023-11-09 VITALS — BP 149/92 | HR 96 | Temp 97.1°F | Resp 16 | Wt 184.5 lb

## 2023-11-09 DIAGNOSIS — Z08 Encounter for follow-up examination after completed treatment for malignant neoplasm: Secondary | ICD-10-CM | POA: Diagnosis not present

## 2023-11-09 DIAGNOSIS — E538 Deficiency of other specified B group vitamins: Secondary | ICD-10-CM | POA: Insufficient documentation

## 2023-11-09 DIAGNOSIS — C3490 Malignant neoplasm of unspecified part of unspecified bronchus or lung: Secondary | ICD-10-CM

## 2023-11-09 DIAGNOSIS — Z85118 Personal history of other malignant neoplasm of bronchus and lung: Secondary | ICD-10-CM | POA: Insufficient documentation

## 2023-11-09 DIAGNOSIS — Z9221 Personal history of antineoplastic chemotherapy: Secondary | ICD-10-CM | POA: Diagnosis not present

## 2023-11-18 ENCOUNTER — Ambulatory Visit (HOSPITAL_COMMUNITY)
Admission: RE | Admit: 2023-11-18 | Discharge: 2023-11-18 | Disposition: A | Discharge status: Home / Self Care | Source: Ambulatory Visit | Attending: Physician Assistant | Admitting: Physician Assistant

## 2023-11-18 DIAGNOSIS — C3490 Malignant neoplasm of unspecified part of unspecified bronchus or lung: Secondary | ICD-10-CM | POA: Insufficient documentation

## 2023-11-18 DIAGNOSIS — R918 Other nonspecific abnormal finding of lung field: Secondary | ICD-10-CM | POA: Diagnosis not present

## 2023-11-18 LAB — GLUCOSE, CAPILLARY: Glucose-Capillary: 87 mg/dL (ref 70–99)

## 2023-11-18 MED ORDER — FLUDEOXYGLUCOSE F - 18 (FDG) INJECTION
9.2000 | Freq: Once | INTRAVENOUS | Status: AC
  Administered 2023-11-18: 9.2 via INTRAVENOUS

## 2023-12-02 ENCOUNTER — Encounter (HOSPITAL_COMMUNITY): Payer: Self-pay

## 2023-12-02 ENCOUNTER — Emergency Department (HOSPITAL_COMMUNITY)
Admission: EM | Admit: 2023-12-02 | Discharge: 2023-12-02 | Disposition: A | Discharge status: Home / Self Care | Attending: Emergency Medicine | Admitting: Emergency Medicine

## 2023-12-02 ENCOUNTER — Other Ambulatory Visit: Payer: Self-pay

## 2023-12-02 ENCOUNTER — Emergency Department (HOSPITAL_COMMUNITY)

## 2023-12-02 DIAGNOSIS — S0990XA Unspecified injury of head, initial encounter: Secondary | ICD-10-CM | POA: Diagnosis not present

## 2023-12-02 DIAGNOSIS — W01198A Fall on same level from slipping, tripping and stumbling with subsequent striking against other object, initial encounter: Secondary | ICD-10-CM | POA: Diagnosis not present

## 2023-12-02 DIAGNOSIS — S0181XA Laceration without foreign body of other part of head, initial encounter: Secondary | ICD-10-CM | POA: Diagnosis not present

## 2023-12-02 DIAGNOSIS — I6782 Cerebral ischemia: Secondary | ICD-10-CM | POA: Diagnosis not present

## 2023-12-02 NOTE — ED Notes (Signed)
 Wound to forehead cleaned and dressed with a band-aid per provider's request.

## 2023-12-02 NOTE — ED Triage Notes (Addendum)
 Pt has been drinking alcohol , had 3-4 drinks. Drinks daily. Tripped and hit head on floor yesterday around 1700. Gash to right side of head above eyebrow. Denies LOC, no blood thinners.

## 2023-12-02 NOTE — ED Provider Notes (Signed)
 [place] EMERGENCY DEPARTMENT AT [facility] Provider Note   CSN: [id number] Arrival date & time: 12/02/23  1524     History  Chief Complaint  Patient presents with   Head Injury    [name] is a 68 y.o. male.  Patient fell and hit his head yesterday.  He has been about 24 hours since he hit his head.  Patient has a 1 cm laceration to the right forehead  The history is provided by the patient and medical records. No language interpreter was used.  Head Injury Location:  Frontal Mechanism of injury: fall   Fall:    Impact surface:  Hard floor   Point of impact:  Head   Entrapped after fall: no   Pain details:    Quality:  Aching   Severity:  Mild   Timing:  Constant   Progression:  Waxing and waning Chronicity:  New Relieved by:  Nothing Worsened by:  Nothing Ineffective treatments:  None tried Associated symptoms: headache   Associated symptoms: no blurred vision and no seizures        Home Medications Prior to Admission medications   Medication Sig Start Date End Date Taking? Authorizing Provider  acetaminophen  (TYLENOL ) 325 MG tablet Take 2 tablets (650 mg total) by mouth every 6 (six) hours as needed for mild pain (or Fever >/= 101). 01/06/23   Armenta Landau, MD  albuterol  (VENTOLIN  HFA) 108 (90 Base) MCG/ACT inhaler INHALE 2 PUFFS INTO THE LUNGS EVERY 6 HOURS AS NEEDED FOR WHEEZING OR SHORTNESS OF BREATH 08/08/23   Newlin, Enobong, MD  amLODipine  (NORVASC ) 10 MG tablet Take 1 tablet (10 mg total) by mouth daily. 07/05/23 08/04/23  Newlin, Enobong, MD  atorvastatin  (LIPITOR) 40 MG tablet Take 1 tablet (40 mg total) by mouth daily. 11/24/22   Newlin, Enobong, MD  cetirizine  (ZYRTEC ) 10 MG tablet Take 1 tablet (10 mg total) by mouth daily. 12/31/21   Newlin, Enobong, MD  clobetasol  (OLUX ) 0.05 % topical foam 1 application to affected area    [provider]  coal tar (NEUTROGENA T-GEL) 0.5 % shampoo Apply 1 application topically daily.     [provider]  feeding supplement (ENSURE ENLIVE / ENSURE PLUS) LIQD Take 237 mLs by mouth 2 (two) times daily between meals. 01/07/23   Armenta Landau, MD  folic acid  (FOLVITE ) 1 MG tablet Take 1 tablet (1 mg total) by mouth daily. 01/07/23   Armenta Landau, MD  gabapentin  (NEURONTIN ) 300 MG capsule Take 2 capsules (600 mg total) by mouth at bedtime. 12/31/21   Newlin, Enobong, MD  hydrocortisone  (ANUSOL -HC) 25 MG suppository Place 1 suppository (25 mg total) rectally 2 (two) times daily. 02/16/20   Maryanna Smart, PA-C  hydroxypropyl methylcellulose / hypromellose (ISOPTO TEARS / GONIOVISC) 2.5 % ophthalmic solution Place 1 drop into both eyes 3 (three) times daily as needed for dry eyes.    [provider]  magnesium  oxide (MAG-OX) 400 (240 Mg) MG tablet Take 2 tablets (800 mg total) by mouth 2 (two) times daily. 01/06/23   Armenta Landau, MD  methocarbamol  (ROBAXIN ) 750 MG tablet Take 1 tablet (750 mg total) by mouth every 8 (eight) hours as needed for muscle spasms. Must have office visit for refills 08/08/23   Newlin, Enobong, MD  Multiple Vitamin (MULTIVITAMIN WITH MINERALS) TABS tablet Take 1 tablet by mouth daily. 01/07/23   Armenta Landau, MD  nicotine  polacrilex (NICORETTE ) 2 MG gum Take 1 each (2  mg total) by mouth as needed for smoking cessation. 01/06/23   Armenta Landau, MD  Omega-3 Fatty Acids (FISH OIL) 1200 MG CAPS Take 1,200 mg by mouth daily.     [provider]  omeprazole  (PRILOSEC) 20 MG capsule TAKE 1 CAPSULE(20 MG) BY MOUTH DAILY 08/08/23   Newlin, Enobong, MD  oxyCODONE  10 MG TABS Take 1 tablet (10 mg total) by mouth every 6 (six) hours as needed for severe pain (severe pain not relieved by tylenol , robaxin ). 10/21/22   Charlott Converse, PA-C  thiamine  (VITAMIN B-1) 100 MG tablet Take 100 mg by mouth daily.    [provider]      Allergies    Ace inhibitors, Furosemide , and Aspirin     Review of Systems   Review of Systems   Constitutional:  Negative for appetite change and fatigue.  HENT:  Negative for congestion, ear discharge and sinus pressure.   Eyes:  Negative for blurred vision and discharge.  Respiratory:  Negative for cough.   Cardiovascular:  Negative for chest pain.  Gastrointestinal:  Negative for abdominal pain and diarrhea.  Genitourinary:  Negative for frequency and hematuria.  Musculoskeletal:  Negative for back pain.  Skin:  Negative for rash.  Neurological:  Positive for headaches. Negative for seizures.  Psychiatric/Behavioral:  Negative for hallucinations.     Physical Exam Updated Vital Signs BP (!) 139/90 (BP Location: Right Arm)   Pulse 85   Temp 98.2 F (36.8 C) (Oral)   Resp 16   Ht 6\' 2"  (1.88 m)   Wt 81.6 kg   SpO2 98%   BMI 23.11 kg/m  Physical Exam Vitals and nursing note reviewed.  Constitutional:      Appearance: He is well-developed.  HENT:     Head: Normocephalic.     Comments: 1 cm healing laceration to forehead    Nose: Nose normal.  Eyes:     General: No scleral icterus.    Conjunctiva/sclera: Conjunctivae normal.  Neck:     Thyroid : No thyromegaly.  Cardiovascular:     Rate and Rhythm: Normal rate and regular rhythm.     Heart sounds: No murmur heard.    No friction rub. No gallop.  Pulmonary:     Breath sounds: No stridor. No wheezing or rales.  Chest:     Chest wall: No tenderness.  Abdominal:     General: There is no distension.     Tenderness: There is no abdominal tenderness. There is no rebound.  Musculoskeletal:        General: Normal range of motion.     Cervical back: Neck supple.  Lymphadenopathy:     Cervical: No cervical adenopathy.  Skin:    Findings: No erythema or rash.  Neurological:     Mental Status: He is alert and oriented to person, place, and time.     Motor: No abnormal muscle tone.     Coordination: Coordination normal.  Psychiatric:        Behavior: Behavior normal.     ED Results / Procedures / Treatments    Labs (all labs ordered are listed, but only abnormal results are displayed) Labs Reviewed - No data to display  EKG None  Radiology CT Head Wo Contrast Result Date: 12/02/2023 CLINICAL DATA:  Head trauma EXAM: CT HEAD WITHOUT CONTRAST TECHNIQUE: Contiguous axial images were obtained from the base of the skull through the vertex without intravenous contrast. RADIATION DOSE REDUCTION: This exam was performed according to the departmental  dose-optimization program which includes automated exposure control, adjustment of the mA and/or kV according to patient size and/or use of iterative reconstruction technique. COMPARISON:  CT brain 10/18/2022 FINDINGS: Brain: No acute territorial infarction, hemorrhage or intracranial mass. Atrophy and mild chronic small vessel ischemic changes of the white matter. Stable ventricle size Vascular: No hyperdense vessels.  Carotid vascular calcification Skull: Normal. Negative for fracture or focal lesion. Sinuses/Orbits: No acute finding. Other: None IMPRESSION: 1. No CT evidence for acute intracranial abnormality. 2. Atrophy and mild chronic small vessel ischemic changes of the white matter. Electronically Signed   By: Esmeralda Hedge M.D.   On: 12/02/2023 19:02    Procedures Procedures    Medications Ordered in ED Medications - No data to display  ED Course/ Medical Decision Making/ A&P                                 Medical Decision Making Amount and/or Complexity of Data Reviewed Radiology: ordered.   Head injury, CT head negative.  Old laceration to forehead will be cleaned and he will follow-up with his PCP        Final Clinical Impression(s) / ED Diagnoses Final diagnoses:  Injury of head, initial encounter    Rx / DC Orders ED Discharge Orders     None         Cheyenne Cotta, MD 12/04/23 1153

## 2023-12-02 NOTE — Discharge Instructions (Signed)
 Clean cut twice a day with soap and water.   Take tylenol  for pain.   Follow up with your md next week for recheck

## 2023-12-02 NOTE — ED Notes (Signed)
[place] Sandwich given

## 2023-12-02 NOTE — ED Notes (Signed)
 Patient cussing stating "I want some damn drink I can't get anything here, I am not staying here. What the hell is taking so long, I want some damn food." Son at bedside trying to calm down patient.

## 2023-12-06 ENCOUNTER — Other Ambulatory Visit: Payer: Self-pay | Admitting: Medical Oncology

## 2023-12-06 DIAGNOSIS — C3491 Malignant neoplasm of unspecified part of right bronchus or lung: Secondary | ICD-10-CM

## 2023-12-07 ENCOUNTER — Inpatient Hospital Stay: Attending: Physician Assistant | Admitting: Internal Medicine

## 2023-12-07 ENCOUNTER — Inpatient Hospital Stay

## 2023-12-07 VITALS — BP 131/80 | HR 98 | Temp 97.5°F | Resp 18 | Ht 74.0 in | Wt 184.3 lb

## 2023-12-07 DIAGNOSIS — Z9221 Personal history of antineoplastic chemotherapy: Secondary | ICD-10-CM | POA: Insufficient documentation

## 2023-12-07 DIAGNOSIS — Z79899 Other long term (current) drug therapy: Secondary | ICD-10-CM | POA: Insufficient documentation

## 2023-12-07 DIAGNOSIS — C3491 Malignant neoplasm of unspecified part of right bronchus or lung: Secondary | ICD-10-CM | POA: Diagnosis not present

## 2023-12-07 DIAGNOSIS — Z85118 Personal history of other malignant neoplasm of bronchus and lung: Secondary | ICD-10-CM | POA: Insufficient documentation

## 2023-12-07 LAB — CMP (CANCER CENTER ONLY)
ALT: 11 U/L (ref 0–44)
AST: 33 U/L (ref 15–41)
Albumin: 3.7 g/dL (ref 3.5–5.0)
Alkaline Phosphatase: 144 U/L — ABNORMAL HIGH (ref 38–126)
Anion gap: 11 (ref 5–15)
BUN: 23 mg/dL (ref 8–23)
CO2: 25 mmol/L (ref 22–32)
Calcium: 8.8 mg/dL — ABNORMAL LOW (ref 8.9–10.3)
Chloride: 95 mmol/L — ABNORMAL LOW (ref 98–111)
Creatinine: 1.43 mg/dL — ABNORMAL HIGH (ref 0.61–1.24)
GFR, Estimated: 53 mL/min — ABNORMAL LOW (ref >60)
Glucose, Bld: 76 mg/dL (ref 70–99)
Potassium: 3.9 mmol/L (ref 3.5–5.1)
Sodium: 131 mmol/L — ABNORMAL LOW (ref 135–145)
Total Bilirubin: 1 mg/dL (ref 0.0–1.2)
Total Protein: 8.5 g/dL — ABNORMAL HIGH (ref 6.5–8.1)

## 2023-12-07 LAB — CBC WITH DIFFERENTIAL (CANCER CENTER ONLY)
Abs Immature Granulocytes: 0.02 K/uL (ref 0.00–0.07)
Basophils Absolute: 0 K/uL (ref 0.0–0.1)
Basophils Relative: 1 %
Eosinophils Absolute: 0 K/uL (ref 0.0–0.5)
Eosinophils Relative: 1 %
HCT: 30.8 % — ABNORMAL LOW (ref 39.0–52.0)
Hemoglobin: 11 g/dL — ABNORMAL LOW (ref 13.0–17.0)
Immature Granulocytes: 1 %
Lymphocytes Relative: 29 %
Lymphs Abs: 1 K/uL (ref 0.7–4.0)
MCH: 31.5 pg (ref 26.0–34.0)
MCHC: 35.7 g/dL (ref 30.0–36.0)
MCV: 88.3 fL (ref 80.0–100.0)
Monocytes Absolute: 0.5 K/uL (ref 0.1–1.0)
Monocytes Relative: 16 %
Neutro Abs: 1.7 K/uL (ref 1.7–7.7)
Neutrophils Relative %: 52 %
Platelet Count: 108 K/uL — ABNORMAL LOW (ref 150–400)
RBC: 3.49 MIL/uL — ABNORMAL LOW (ref 4.22–5.81)
RDW: 15.4 % (ref 11.5–15.5)
WBC Count: 3.3 K/uL — ABNORMAL LOW (ref 4.0–10.5)
nRBC: 0 % (ref 0.0–0.2)
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
## 2023-12-09 NOTE — Progress Notes (Signed)
 Rosetta Cons, MD  Joelle Musca; P Ir Procedure Requests PROCEDURE / BIOPSY REVIEW Date: 12/09/23  Requested Biopsy site: L4 Reason for request: L4 FDG avid lesion right side Imaging review: Best seen on PET  Decision: Approved Imaging modality to perform: CT Schedule with: Moderate Sedation Schedule for: Any VIR  Additional comments: @Schedulers .  Please contact me with questions, concerns, or if issue pertaining to this request arise.  Rosetta Cons, MD [facility]       Previous Messages    ----- Message ----- From: Mariette Cowley Sent: 12/08/2023  11:12 AM EDT To: Rosetta Cons, MD; Joelle Musca; * Subject: CT Biopsy                                      Procedure : CT Biopsy  Reason: CT-guided core biopsy of the L4 hypermetabolic bone lesion seen on the recent PET scan. Dx: Stage III squamous cell carcinoma of right lung (HCC) [C34.91 (ICD-10-CM)]    History : CT Head w/o , NM PET restage skull base to thigh , ct chest w/o  Provider : Marlene Simas, MD  Contact: [phone]

## 2023-12-27 ENCOUNTER — Other Ambulatory Visit: Payer: Self-pay | Admitting: Radiology

## 2023-12-27 DIAGNOSIS — M899 Disorder of bone, unspecified: Secondary | ICD-10-CM

## 2023-12-28 ENCOUNTER — Other Ambulatory Visit: Payer: Self-pay

## 2023-12-28 ENCOUNTER — Encounter (HOSPITAL_COMMUNITY): Payer: Self-pay

## 2023-12-28 ENCOUNTER — Ambulatory Visit (HOSPITAL_COMMUNITY)
Admission: RE | Admit: 2023-12-28 | Discharge: 2023-12-28 | Disposition: A | Discharge status: Home / Self Care | Source: Ambulatory Visit | Attending: Internal Medicine | Admitting: Internal Medicine

## 2023-12-28 DIAGNOSIS — C3412 Malignant neoplasm of upper lobe, left bronchus or lung: Secondary | ICD-10-CM | POA: Insufficient documentation

## 2023-12-28 DIAGNOSIS — Z79899 Other long term (current) drug therapy: Secondary | ICD-10-CM | POA: Diagnosis not present

## 2023-12-28 DIAGNOSIS — M899 Disorder of bone, unspecified: Secondary | ICD-10-CM | POA: Diagnosis not present

## 2023-12-28 DIAGNOSIS — Z923 Personal history of irradiation: Secondary | ICD-10-CM | POA: Diagnosis not present

## 2023-12-28 DIAGNOSIS — F17210 Nicotine dependence, cigarettes, uncomplicated: Secondary | ICD-10-CM | POA: Insufficient documentation

## 2023-12-28 DIAGNOSIS — Z9221 Personal history of antineoplastic chemotherapy: Secondary | ICD-10-CM | POA: Diagnosis not present

## 2023-12-28 DIAGNOSIS — C801 Malignant (primary) neoplasm, unspecified: Secondary | ICD-10-CM | POA: Diagnosis not present

## 2023-12-28 DIAGNOSIS — C7951 Secondary malignant neoplasm of bone: Secondary | ICD-10-CM | POA: Insufficient documentation

## 2023-12-28 DIAGNOSIS — C3491 Malignant neoplasm of unspecified part of right bronchus or lung: Secondary | ICD-10-CM

## 2023-12-28 LAB — PROTIME-INR
INR: 1 (ref 0.8–1.2)
Prothrombin Time: 13.7 s (ref 11.4–15.2)

## 2023-12-28 LAB — CBC
HCT: 35 % — ABNORMAL LOW (ref 39.0–52.0)
Hemoglobin: 12.1 g/dL — ABNORMAL LOW (ref 13.0–17.0)
MCH: 31.6 pg (ref 26.0–34.0)
MCHC: 34.6 g/dL (ref 30.0–36.0)
MCV: 91.4 fL (ref 80.0–100.0)
Platelets: 122 10*3/uL — ABNORMAL LOW (ref 150–400)
RBC: 3.83 MIL/uL — ABNORMAL LOW (ref 4.22–5.81)
RDW: 17.3 % — ABNORMAL HIGH (ref 11.5–15.5)
WBC: 3.2 10*3/uL — ABNORMAL LOW (ref 4.0–10.5)
nRBC: 0 % (ref 0.0–0.2)

## 2023-12-28 MED ORDER — MIDAZOLAM HCL 2 MG/2ML IJ SOLN
INTRAMUSCULAR | Status: AC | PRN
  Administered 2023-12-28 (×2): .5 mg via INTRAVENOUS
  Administered 2023-12-28: 1 mg via INTRAVENOUS

## 2023-12-28 MED ORDER — FENTANYL CITRATE (PF) 100 MCG/2ML IJ SOLN
INTRAMUSCULAR | Status: AC | PRN
  Administered 2023-12-28: 25 ug via INTRAVENOUS
  Administered 2023-12-28: 50 ug via INTRAVENOUS
  Administered 2023-12-28: 25 ug via INTRAVENOUS

## 2023-12-28 MED ORDER — SODIUM CHLORIDE 0.9 % IV SOLN: INTRAVENOUS | Status: DC

## 2023-12-28 MED ORDER — LIDOCAINE HCL 1 % IJ SOLN
10.0000 mL | Freq: Once | INTRAMUSCULAR | Status: AC
  Administered 2023-12-28: 10 mL via INTRADERMAL

## 2023-12-28 MED ORDER — FENTANYL CITRATE (PF) 100 MCG/2ML IJ SOLN
INTRAMUSCULAR | Status: AC
  Filled 2023-12-28: qty 2

## 2023-12-28 MED ORDER — MIDAZOLAM HCL 2 MG/2ML IJ SOLN
INTRAMUSCULAR | Status: AC
Start: 2023-12-28 — End: 2023-12-28
  Filled 2023-12-28: qty 2

## 2023-12-28 NOTE — Procedures (Signed)
 Pre procedural Dx: L4 FDG avid lesion  Post procedural Dx: Same  Technically successful CT guided biopsy of L4 vertebra   EBL: None.   Complications: None immediate.   KANDICE Banner, MD Pager #: [phone]
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
## 2023-12-30 ENCOUNTER — Other Ambulatory Visit: Payer: Self-pay | Admitting: Medical Oncology

## 2023-12-30 DIAGNOSIS — C3491 Malignant neoplasm of unspecified part of right bronchus or lung: Secondary | ICD-10-CM

## 2023-12-30 LAB — SURGICAL PATHOLOGY

## 2024-01-02 ENCOUNTER — Inpatient Hospital Stay

## 2024-01-02 ENCOUNTER — Inpatient Hospital Stay: Admitting: Internal Medicine

## 2024-01-11 ENCOUNTER — Telehealth: Payer: Self-pay | Admitting: Physician Assistant

## 2024-01-11 NOTE — Telephone Encounter (Signed)
 Left the patients daughter a voicemail with the rescheduled appointment details.

## 2024-01-25 ENCOUNTER — Telehealth: Payer: Self-pay | Admitting: Family Medicine

## 2024-01-25 NOTE — Telephone Encounter (Signed)
 pt unconfirmed appt (per volunteer 7/23)

## 2024-01-26 ENCOUNTER — Ambulatory Visit: Attending: Family Medicine | Admitting: Family Medicine

## 2024-01-26 ENCOUNTER — Encounter: Payer: Self-pay | Admitting: Family Medicine

## 2024-01-26 VITALS — BP 167/101 | HR 80 | Wt 186.0 lb

## 2024-01-26 DIAGNOSIS — I700 Atherosclerosis of aorta: Secondary | ICD-10-CM

## 2024-01-26 DIAGNOSIS — F10229 Alcohol dependence with intoxication, unspecified: Secondary | ICD-10-CM

## 2024-01-26 DIAGNOSIS — Z91148 Patient's other noncompliance with medication regimen for other reason: Secondary | ICD-10-CM

## 2024-01-26 DIAGNOSIS — G621 Alcoholic polyneuropathy: Secondary | ICD-10-CM | POA: Diagnosis not present

## 2024-01-26 DIAGNOSIS — R296 Repeated falls: Secondary | ICD-10-CM | POA: Diagnosis not present

## 2024-01-26 DIAGNOSIS — C3491 Malignant neoplasm of unspecified part of right bronchus or lung: Secondary | ICD-10-CM

## 2024-01-26 DIAGNOSIS — E1169 Type 2 diabetes mellitus with other specified complication: Secondary | ICD-10-CM

## 2024-01-26 DIAGNOSIS — L409 Psoriasis, unspecified: Secondary | ICD-10-CM

## 2024-01-26 DIAGNOSIS — K219 Gastro-esophageal reflux disease without esophagitis: Secondary | ICD-10-CM | POA: Diagnosis not present

## 2024-01-26 DIAGNOSIS — E1159 Type 2 diabetes mellitus with other circulatory complications: Secondary | ICD-10-CM

## 2024-01-26 DIAGNOSIS — I110 Hypertensive heart disease with heart failure: Secondary | ICD-10-CM

## 2024-01-26 DIAGNOSIS — E119 Type 2 diabetes mellitus without complications: Secondary | ICD-10-CM

## 2024-01-26 DIAGNOSIS — E1149 Type 2 diabetes mellitus with other diabetic neurological complication: Secondary | ICD-10-CM

## 2024-01-26 LAB — POCT GLYCOSYLATED HEMOGLOBIN (HGB A1C): HbA1c, POC (controlled diabetic range): 4.6 % (ref 0.0–7.0)

## 2024-01-26 MED ORDER — OMEPRAZOLE 20 MG PO CPDR: DELAYED_RELEASE_CAPSULE | ORAL | 0 refills | Status: DC

## 2024-01-26 MED ORDER — CLOBETASOL PROPIONATE 0.05 % EX FOAM: Freq: Two times a day (BID) | CUTANEOUS | 0 refills | Status: DC

## 2024-01-26 MED ORDER — NALTREXONE HCL 50 MG PO TABS: 50.0000 mg | ORAL_TABLET | Freq: Every day | ORAL | 1 refills | Status: DC

## 2024-01-26 MED ORDER — GABAPENTIN 300 MG PO CAPS: 300.0000 mg | ORAL_CAPSULE | Freq: Every day | ORAL | 3 refills | Status: DC

## 2024-01-26 MED ORDER — AMLODIPINE BESYLATE 10 MG PO TABS: 10.0000 mg | ORAL_TABLET | Freq: Every day | ORAL | 3 refills | Status: DC

## 2024-01-26 MED ORDER — ATORVASTATIN CALCIUM 40 MG PO TABS: 40.0000 mg | ORAL_TABLET | Freq: Every day | ORAL | 3 refills | Status: AC

## 2024-01-26 NOTE — Patient Instructions (Signed)
 VISIT SUMMARY:  During your visit, we discussed several health concerns including your recurrent falls, alcohol  use, peripheral neuropathy, lung cancer, high blood pressure, acid reflux, psoriasis, and diabetes management. We have made some changes to your medications and discussed lifestyle modifications to improve your overall health.  YOUR PLAN:  -FALLS: Your falls are likely due to alcohol  consumption, which increases your risk of fractures and head injuries. We have prescribed naltrexone  to help reduce your alcohol  cravings and discussed the importance of quitting alcohol  to prevent further falls and injuries.  -ALCOHOL  ABUSE: Excessive alcohol  intake is contributing to your falls and neuropathy. We have prescribed naltrexone  to help reduce your alcohol  cravings and discussed the importance of quitting alcohol  to improve your health and prevent complications.  -PERIPHERAL NEUROPATHY: Peripheral neuropathy is causing sharp, burning pain and numbness in your toes and feet, likely due to alcohol  consumption. We have prescribed gabapentin  300 mg at bedtime to help manage the pain and emphasized the importance of quitting alcohol  to alleviate your symptoms.  -STAGE III NON-SMALL CELL SQUAMOUS CELL CARCINOMA OF THE RIGHT LUNG: You have lung cancer, and your oncologist is concerned about recurrence. It is important to continue your follow-up appointments with your oncologist to monitor your condition.  -HYPERTENSION: Your blood pressure is elevated due to inconsistent medication adherence. We have refilled your amlodipine  prescription and will arrange for a home care nurse to assist with medication management.  -GASTROESOPHAGEAL REFLUX DISEASE (GERD): You have ongoing acid reflux and heartburn. We have refilled your omeprazole  prescription to help manage these symptoms.  -PSORIASIS: You are experiencing itching on your elbows and back due to psoriasis. We have prescribed clobetasol  cream to help  relieve the itching.  -TYPE 2 DIABETES MELLITUS: Your diabetes is well-controlled with a normal A1c of 4.6. Continue with your current diet control to maintain this level.  INSTRUCTIONS:  Please follow up with your oncologist as scheduled to monitor your lung cancer. Additionally, we will arrange for a home care nurse to assist you with medication management for your high blood pressure. It is important to take your medications as prescribed and consider quitting alcohol  to improve your overall health and prevent further falls.
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
## 2024-01-30 ENCOUNTER — Telehealth: Payer: Self-pay

## 2024-01-30 NOTE — Telephone Encounter (Signed)
 Referral received for home health nursing. I called the patient and he had no preference for home health agencies.  Message sent to Arna Sayres, [facility], requesting she review the referral and let me know if she is able to accept it.

## 2024-01-31 NOTE — Telephone Encounter (Signed)
 Message received from Arna Sayres, [facility] stating they will be contacting the patient to schedule the start of care.  I called the patient and explained to him that a representative from [facility] will be contacting him to schedule a home visit and he verbalized understanding.

## 2024-02-02 ENCOUNTER — Other Ambulatory Visit: Payer: Self-pay | Admitting: Internal Medicine

## 2024-02-02 ENCOUNTER — Inpatient Hospital Stay: Attending: Physician Assistant

## 2024-02-02 ENCOUNTER — Other Ambulatory Visit: Payer: Self-pay | Admitting: Physician Assistant

## 2024-02-02 ENCOUNTER — Inpatient Hospital Stay (HOSPITAL_BASED_OUTPATIENT_CLINIC_OR_DEPARTMENT_OTHER): Admitting: Internal Medicine

## 2024-02-02 VITALS — BP 147/96 | HR 81 | Temp 97.6°F | Resp 17 | Ht 74.0 in | Wt 188.0 lb

## 2024-02-02 DIAGNOSIS — C3491 Malignant neoplasm of unspecified part of right bronchus or lung: Secondary | ICD-10-CM

## 2024-02-02 DIAGNOSIS — C7951 Secondary malignant neoplasm of bone: Secondary | ICD-10-CM | POA: Insufficient documentation

## 2024-02-02 DIAGNOSIS — C3412 Malignant neoplasm of upper lobe, left bronchus or lung: Secondary | ICD-10-CM

## 2024-02-02 DIAGNOSIS — C3431 Malignant neoplasm of lower lobe, right bronchus or lung: Secondary | ICD-10-CM | POA: Diagnosis not present

## 2024-02-02 LAB — CBC WITH DIFFERENTIAL (CANCER CENTER ONLY)
Abs Immature Granulocytes: 0.02 K/uL (ref 0.00–0.07)
Basophils Absolute: 0 K/uL (ref 0.0–0.1)
Basophils Relative: 1 %
Eosinophils Absolute: 0 K/uL (ref 0.0–0.5)
Eosinophils Relative: 2 %
HCT: 30.6 % — ABNORMAL LOW (ref 39.0–52.0)
Hemoglobin: 10.8 g/dL — ABNORMAL LOW (ref 13.0–17.0)
Immature Granulocytes: 1 %
Lymphocytes Relative: 20 %
Lymphs Abs: 0.5 K/uL — ABNORMAL LOW (ref 0.7–4.0)
MCH: 31.1 pg (ref 26.0–34.0)
MCHC: 35.3 g/dL (ref 30.0–36.0)
MCV: 88.2 fL (ref 80.0–100.0)
Monocytes Absolute: 0.5 K/uL (ref 0.1–1.0)
Monocytes Relative: 19 %
Neutro Abs: 1.5 K/uL — ABNORMAL LOW (ref 1.7–7.7)
Neutrophils Relative %: 57 %
Platelet Count: 91 K/uL — ABNORMAL LOW (ref 150–400)
RBC: 3.47 MIL/uL — ABNORMAL LOW (ref 4.22–5.81)
RDW: 17.6 % — ABNORMAL HIGH (ref 11.5–15.5)
WBC Count: 2.7 K/uL — ABNORMAL LOW (ref 4.0–10.5)
nRBC: 0 % (ref 0.0–0.2)

## 2024-02-02 LAB — CMP (CANCER CENTER ONLY)
ALT: 11 U/L (ref 0–44)
AST: 26 U/L (ref 15–41)
Albumin: 3.1 g/dL — ABNORMAL LOW (ref 3.5–5.0)
Alkaline Phosphatase: 209 U/L — ABNORMAL HIGH (ref 38–126)
Anion gap: 8 (ref 5–15)
BUN: 19 mg/dL (ref 8–23)
CO2: 29 mmol/L (ref 22–32)
Calcium: 8.1 mg/dL — ABNORMAL LOW (ref 8.9–10.3)
Chloride: 96 mmol/L — ABNORMAL LOW (ref 98–111)
Creatinine: 1.4 mg/dL — ABNORMAL HIGH (ref 0.61–1.24)
GFR, Estimated: 54 mL/min — ABNORMAL LOW (ref >60)
Glucose, Bld: 92 mg/dL (ref 70–99)
Potassium: 3.3 mmol/L — ABNORMAL LOW (ref 3.5–5.1)
Sodium: 133 mmol/L — ABNORMAL LOW (ref 135–145)
Total Bilirubin: 1.2 mg/dL (ref 0.0–1.2)
Total Protein: 7.9 g/dL (ref 6.5–8.1)

## 2024-02-02 MED ORDER — LIDOCAINE-PRILOCAINE 2.5-2.5 % EX CREA
TOPICAL_CREAM | CUTANEOUS | 3 refills | Status: DC
Start: 2024-02-02 — End: 2024-05-07

## 2024-02-02 MED ORDER — PROCHLORPERAZINE MALEATE 10 MG PO TABS
10.0000 mg | ORAL_TABLET | Freq: Four times a day (QID) | ORAL | 1 refills | Status: DC | PRN
Start: 2024-02-02 — End: 2024-05-07

## 2024-02-02 MED ORDER — ONDANSETRON HCL 8 MG PO TABS: 8.0000 mg | ORAL_TABLET | Freq: Three times a day (TID) | ORAL | 1 refills | Status: DC | PRN
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
## 2024-02-02 NOTE — Progress Notes (Signed)
 DISCONTINUE ON PATHWAY REGIMEN - Non-Small Cell Lung     A cycle is every 14 days:     Durvalumab    **Always confirm dose/schedule in your pharmacy ordering system**  PRIOR TREATMENT: [id number]: Durvalumab  10 mg/kg q14 Days x up to 12 Months  START OFF PATHWAY REGIMEN - Non-Small Cell Lung   [id number]:Cemiplimab 350 mg IV D1 + Carboplatin  AUC=6 IV D1 + Paclitaxel  200 mg/m2 IV D1 q21 Days x 4 Cycles:   A cycle is every 21 days:     Paclitaxel       Carboplatin       Cemiplimab-[id number]   **Always confirm dose/schedule in your pharmacy ordering system**  Patient Characteristics: Stage IV Metastatic, Squamous, Molecular Analysis Not Elected, PS = 0, 1, Initial Chemotherapy/Immunotherapy, Immunotherapy Candidate, PD-L1 Expression Positive ? 50% (TPS) Therapeutic Status: Stage IV Metastatic Histology: Squamous Cell ECOG Performance Status: 1 Chemotherapy/Immunotherapy Line of Therapy: Initial Chemotherapy/Immunotherapy Immunotherapy Candidate Status: Candidate for Immunotherapy PD-L1 Expression Status: PD-L1 Positive ? 50% (TPS) Intent of Therapy: Non-Curative / Palliative Intent, Discussed with Patient

## 2024-02-03 ENCOUNTER — Other Ambulatory Visit: Payer: Self-pay

## 2024-02-03 ENCOUNTER — Other Ambulatory Visit: Payer: Self-pay | Admitting: Radiology

## 2024-02-03 DIAGNOSIS — C3491 Malignant neoplasm of unspecified part of right bronchus or lung: Secondary | ICD-10-CM

## 2024-02-04 ENCOUNTER — Other Ambulatory Visit: Payer: Self-pay
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
## 2024-02-05 ENCOUNTER — Other Ambulatory Visit: Payer: Self-pay | Admitting: Student

## 2024-02-06 ENCOUNTER — Ambulatory Visit (HOSPITAL_COMMUNITY)
Admission: RE | Admit: 2024-02-06 | Discharge: 2024-02-06 | Disposition: A | Discharge status: Home / Self Care | Source: Ambulatory Visit | Attending: Internal Medicine | Admitting: Internal Medicine

## 2024-02-06 ENCOUNTER — Encounter: Payer: Self-pay | Admitting: Internal Medicine

## 2024-02-06 DIAGNOSIS — K219 Gastro-esophageal reflux disease without esophagitis: Secondary | ICD-10-CM | POA: Insufficient documentation

## 2024-02-06 DIAGNOSIS — E119 Type 2 diabetes mellitus without complications: Secondary | ICD-10-CM | POA: Diagnosis not present

## 2024-02-06 DIAGNOSIS — C7951 Secondary malignant neoplasm of bone: Secondary | ICD-10-CM | POA: Insufficient documentation

## 2024-02-06 DIAGNOSIS — Z79899 Other long term (current) drug therapy: Secondary | ICD-10-CM | POA: Diagnosis not present

## 2024-02-06 DIAGNOSIS — C3412 Malignant neoplasm of upper lobe, left bronchus or lung: Secondary | ICD-10-CM | POA: Diagnosis not present

## 2024-02-06 DIAGNOSIS — I10 Essential (primary) hypertension: Secondary | ICD-10-CM | POA: Diagnosis not present

## 2024-02-06 DIAGNOSIS — E785 Hyperlipidemia, unspecified: Secondary | ICD-10-CM | POA: Insufficient documentation

## 2024-02-06 DIAGNOSIS — C7800 Secondary malignant neoplasm of unspecified lung: Secondary | ICD-10-CM | POA: Diagnosis not present

## 2024-02-06 DIAGNOSIS — F17210 Nicotine dependence, cigarettes, uncomplicated: Secondary | ICD-10-CM | POA: Insufficient documentation

## 2024-02-06 DIAGNOSIS — C3491 Malignant neoplasm of unspecified part of right bronchus or lung: Secondary | ICD-10-CM

## 2024-02-06 DIAGNOSIS — M109 Gout, unspecified: Secondary | ICD-10-CM | POA: Diagnosis not present

## 2024-02-06 HISTORY — PX: IR IMAGING GUIDED PORT INSERTION: IMG5740

## 2024-02-06 MED ORDER — HEPARIN SOD (PORK) LOCK FLUSH 100 UNIT/ML IV SOLN
500.0000 [IU] | Freq: Once | INTRAVENOUS | Status: AC
  Administered 2024-02-06: 500 [IU] via INTRAVENOUS

## 2024-02-06 MED ORDER — LIDOCAINE-EPINEPHRINE 1 %-1:100000 IJ SOLN
INTRAMUSCULAR | Status: AC
  Filled 2024-02-06: qty 1

## 2024-02-06 MED ORDER — SODIUM CHLORIDE 0.9 % IV SOLN: INTRAVENOUS | Status: DC

## 2024-02-06 MED ORDER — FENTANYL CITRATE (PF) 100 MCG/2ML IJ SOLN
INTRAMUSCULAR | Status: AC | PRN
  Administered 2024-02-06 (×2): 50 ug via INTRAVENOUS

## 2024-02-06 MED ORDER — FENTANYL CITRATE (PF) 100 MCG/2ML IJ SOLN
INTRAMUSCULAR | Status: AC
Start: 2024-02-06 — End: 2024-02-06
  Filled 2024-02-06: qty 2

## 2024-02-06 MED ORDER — MIDAZOLAM HCL 2 MG/2ML IJ SOLN
INTRAMUSCULAR | Status: AC | PRN
  Administered 2024-02-06 (×2): 1 mg via INTRAVENOUS

## 2024-02-06 MED ORDER — DIPHENHYDRAMINE HCL 50 MG/ML IJ SOLN
INTRAMUSCULAR | Status: AC | PRN
Start: 2024-02-06 — End: 2024-02-06
  Administered 2024-02-06: 25 mg via INTRAVENOUS

## 2024-02-06 MED ORDER — LIDOCAINE HCL 1 % IJ SOLN
20.0000 mL | Freq: Once | INTRAMUSCULAR | Status: AC
  Administered 2024-02-06: 10 mL via INTRADERMAL

## 2024-02-06 MED ORDER — LIDOCAINE-EPINEPHRINE 1 %-1:100000 IJ SOLN
20.0000 mL | Freq: Once | INTRAMUSCULAR | Status: AC
  Administered 2024-02-06: 20 mL via INTRADERMAL

## 2024-02-06 MED ORDER — HEPARIN SOD (PORK) LOCK FLUSH 100 UNIT/ML IV SOLN
INTRAVENOUS | Status: AC
  Filled 2024-02-06: qty 5

## 2024-02-06 MED ORDER — LIDOCAINE HCL 1 % IJ SOLN
INTRAMUSCULAR | Status: AC
  Filled 2024-02-06: qty 20

## 2024-02-06 MED ORDER — MIDAZOLAM HCL 2 MG/2ML IJ SOLN
INTRAMUSCULAR | Status: AC
  Filled 2024-02-06: qty 2

## 2024-02-06 MED ORDER — DIPHENHYDRAMINE HCL 50 MG/ML IJ SOLN
INTRAMUSCULAR | Status: AC
  Filled 2024-02-06: qty 1

## 2024-02-06 NOTE — Progress Notes (Signed)
 Pharmacist Chemotherapy Monitoring - Initial Assessment    Anticipated start date: 02/10/24   The following has been reviewed per standard work regarding the patient's treatment regimen: [name] diagnosis, treatment plan and drug doses, and organ/hematologic function Lab orders and baseline tests specific to treatment regimen  The treatment plan start date, drug sequencing, and pre-medications Prior authorization status  Patient's documented medication list, including drug-drug interaction screen and prescriptions for anti-emetics and supportive care specific to the treatment regimen The drug concentrations, fluid compatibility, administration routes, and timing of the medications to be used [name] access for treatment and lifetime cumulative dose history, if applicable  [name] medication allergies and previous infusion related reactions, if applicable   Changes made to treatment plan:  treatment plan date, drug sequencing, drug offset times, and TSH/T4 lab ordered to monitor TSH/T4 with Cemiplimab   Follow up needed:  Pending authorization for treatment    Grettell Ransdell, Pharm.D., CPP 02/06/2024@12 :12 PM

## 2024-02-06 NOTE — Procedures (Signed)
 Interventional Radiology Procedure:   Indications: Metastatic non-small cell lung cancer   Procedure: Port placement  Findings: Right jugular port, tip at SVC/RA junction.    Complications: None     EBL: Minimal, less than 10 ml  Plan: Discharge in one hour.  Keep port site and incisions dry for at least 24 hours.     Laury Huizar R. Philip, MD  Pager: [phone]

## 2024-02-06 NOTE — Discharge Instructions (Signed)
 Implanted Port Insertion, Care After  The following information offers guidance on how to care for yourself after your procedure. Your health care provider may also give you more specific instructions. If you have problems or questions, contact your health care provider.  What can I expect after the procedure? After the procedure, it is common to have: Discomfort at the port insertion site. Bruising on the skin over the port. This should improve over 3-4 days.   Urgent needs - Interventional Radiology, clinic [phone] (mon-fri 8-5).   Wound - May remove dressing and shower in 24 to 48 hours.  Keep site clean and dry.  Replace with bandaid as needed.  Do not submerge in tub or water until site healing well. If closed with glue, glue will flake off on its own.   If ordered by your provider, may start Emla cream (or any other creams ointments or lotions) in 2 weeks or after incision is healed. Port is ready for use immediately.   After completion of treatment, your provider should have you set up for monthly port flushes.   Follow these instructions at home: [facility] care After your port is placed, you will get a manufacturer's information card. The card has information about your port. Keep this card with you at all times. Take care of the port as told by your health care provider. Ask your health care provider if you or a family member can get training for taking care of the port at home. A home health care nurse will be be available to help care for the port. Make sure to remember what type of port you have. Incision care     Follow instructions from your health care provider about how to take care of your port insertion site. Make sure you: Wash your hands with soap and water for at least 20 seconds before and after you change your bandage (dressing). If soap and water are not available, use hand sanitizer. Change your dressing as told by your health care provider. Leave stitches  (sutures), skin glue, or adhesive strips in place. These skin closures may need to stay in place for 2 weeks or longer. If adhesive strip edges start to loosen and curl up, you may trim the loose edges. Do not remove adhesive strips completely unless your health care provider tells you to do that. Check your port insertion site every day for signs of infection. Check for: Redness, swelling, or pain. Fluid or blood. Warmth. Pus or a bad smell. Activity Return to your normal activities as told by your health care provider. Ask your health care provider what activities are safe for you. You may have to avoid lifting. Ask your health care provider how much you can safely lift. General instructions Take over-the-counter and prescription medicines only as told by your health care provider. Do not take baths, swim, or use a hot tub until your health care provider approves. Ask your health care provider if you may take showers. You may only be allowed to take sponge baths. If you were given a sedative during the procedure, it can affect you for several hours. Do not drive or operate machinery until your health care provider says that it is safe. Wear a medical alert bracelet in case of an emergency. This will tell any health care providers that you have a port. Keep all follow-up visits. This is important. Contact a health care provider if: You cannot flush your port with saline as directed, or you cannot  draw blood from the port. You have a fever or chills. You have redness, swelling, or pain around your port insertion site. You have fluid or blood coming from your port insertion site. Your port insertion site feels warm to the touch. You have pus or a bad smell coming from the port insertion site. Get help right away if: You have chest pain or shortness of breath. You have bleeding from your port that you cannot control. These symptoms may be an emergency. Get help right away. Call 911. Do not  wait to see if the symptoms will go away. Do not drive yourself to the hospital. Summary Take care of the port as told by your health care provider. Keep the manufacturer's information card with you at all times. Change your dressing as told by your health care provider. Contact a health care provider if you have a fever or chills or if you have redness, swelling, or pain around your port insertion site. Keep all follow-up visits. This information is not intended to replace advice given to you by your health care provider. Make sure you discuss any questions you have with your health care provider. Document Revised: 12/23/2020 Document Reviewed: 12/23/2020 Elsevier Patient Education  2023 Elsevier Inc.    Moderate Conscious Sedation  Adult  Care After (English)  After the procedure, it is common to have: Sleepiness for a few hours. Impaired judgment for a few hours. Trouble with balance. Nausea or vomiting if you eat too soon. Follow these instructions at home: For the time period you were told by your health care provider:  Rest. Do not participate in activities where you could fall or become injured. Do not drive or use machinery. Do not drink alcohol. Do not take sleeping pills or medicines that cause drowsiness. Do not make important decisions or sign legal documents. Do not take care of children on your own. Eating and drinking Follow instructions from your health care provider about what you may eat and drink. Drink enough fluid to keep your urine pale yellow. If you vomit: Drink clear fluids slowly and in small amounts as you are able. Clear fluids include water, ice chips, low-calorie sports drinks, and fruit juice that has water added to it (diluted fruit juice). Eat light and bland foods in small amounts as you are able. These foods include bananas, applesauce, rice, lean meats, toast, and crackers. General instructions Take over-the-counter and prescription medicines  only as told by your health care provider. Have a responsible adult stay with you for the time you are told. Do not use any products that contain nicotine or tobacco. These products include cigarettes, chewing tobacco, and vaping devices, such as e-cigarettes. If you need help quitting, ask your health care provider. Return to your normal activities as told by your health care provider. Ask your health care provider what activities are safe for you. Your health care provider may give you more instructions. Make sure you know what you can and cannot do. Contact a health care provider if: You are still sleepy or having trouble with balance after 24 hours. You feel light-headed. You vomit every time you eat or drink. You get a rash. You have a fever. You have redness or swelling around the IV site. Get help right away if: You have trouble breathing. You start to feel confused at home. These symptoms may be an emergency. Get help right away. Call 911. Do not wait to see if the symptoms will go away. Do not  drive yourself to the hospital. This information is not intended to replace advice given to you by your health care provider. Make sure you discuss any questions you have with your health care provider.

## 2024-02-07 ENCOUNTER — Other Ambulatory Visit: Payer: Self-pay | Admitting: *Deleted

## 2024-02-07 ENCOUNTER — Other Ambulatory Visit: Payer: Self-pay | Admitting: Physician Assistant

## 2024-02-07 DIAGNOSIS — C3412 Malignant neoplasm of upper lobe, left bronchus or lung: Secondary | ICD-10-CM

## 2024-02-07 DIAGNOSIS — C3491 Malignant neoplasm of unspecified part of right bronchus or lung: Secondary | ICD-10-CM
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
## 2024-02-08 ENCOUNTER — Inpatient Hospital Stay: Attending: Physician Assistant

## 2024-02-08 ENCOUNTER — Encounter: Payer: Self-pay | Admitting: Internal Medicine

## 2024-02-08 DIAGNOSIS — Z5189 Encounter for other specified aftercare: Secondary | ICD-10-CM | POA: Insufficient documentation

## 2024-02-08 DIAGNOSIS — Z79630 Long term (current) use of alkylating agent: Secondary | ICD-10-CM | POA: Insufficient documentation

## 2024-02-08 DIAGNOSIS — C3491 Malignant neoplasm of unspecified part of right bronchus or lung: Secondary | ICD-10-CM

## 2024-02-08 DIAGNOSIS — Z5111 Encounter for antineoplastic chemotherapy: Secondary | ICD-10-CM | POA: Insufficient documentation

## 2024-02-08 DIAGNOSIS — Z5112 Encounter for antineoplastic immunotherapy: Secondary | ICD-10-CM | POA: Insufficient documentation

## 2024-02-08 DIAGNOSIS — Z79633 Long term (current) use of mitotic inhibitor: Secondary | ICD-10-CM | POA: Insufficient documentation

## 2024-02-08 DIAGNOSIS — C3431 Malignant neoplasm of lower lobe, right bronchus or lung: Secondary | ICD-10-CM | POA: Insufficient documentation

## 2024-02-08 DIAGNOSIS — Z79620 Long term (current) use of immunosuppressive biologic: Secondary | ICD-10-CM | POA: Insufficient documentation

## 2024-02-08 DIAGNOSIS — C7951 Secondary malignant neoplasm of bone: Secondary | ICD-10-CM | POA: Insufficient documentation

## 2024-02-08 LAB — GLUCOSE, CAPILLARY: Glucose-Capillary: 84 mg/dL (ref 70–99)

## 2024-02-10 ENCOUNTER — Inpatient Hospital Stay

## 2024-02-10 ENCOUNTER — Ambulatory Visit

## 2024-02-10 ENCOUNTER — Inpatient Hospital Stay: Admitting: Licensed Clinical Social Worker

## 2024-02-10 ENCOUNTER — Telehealth: Payer: Self-pay

## 2024-02-10 VITALS — BP 151/90 | HR 94 | Temp 97.6°F | Resp 18 | Ht 74.0 in | Wt 190.0 lb

## 2024-02-10 DIAGNOSIS — Z79630 Long term (current) use of alkylating agent: Secondary | ICD-10-CM | POA: Diagnosis not present

## 2024-02-10 DIAGNOSIS — Z5112 Encounter for antineoplastic immunotherapy: Secondary | ICD-10-CM | POA: Diagnosis not present

## 2024-02-10 DIAGNOSIS — C3412 Malignant neoplasm of upper lobe, left bronchus or lung: Secondary | ICD-10-CM

## 2024-02-10 DIAGNOSIS — Z5189 Encounter for other specified aftercare: Secondary | ICD-10-CM | POA: Diagnosis not present

## 2024-02-10 DIAGNOSIS — Z79633 Long term (current) use of mitotic inhibitor: Secondary | ICD-10-CM | POA: Diagnosis not present

## 2024-02-10 DIAGNOSIS — C7951 Secondary malignant neoplasm of bone: Secondary | ICD-10-CM | POA: Diagnosis not present

## 2024-02-10 DIAGNOSIS — C3431 Malignant neoplasm of lower lobe, right bronchus or lung: Secondary | ICD-10-CM | POA: Diagnosis not present

## 2024-02-10 DIAGNOSIS — Z5111 Encounter for antineoplastic chemotherapy: Secondary | ICD-10-CM | POA: Diagnosis not present

## 2024-02-10 DIAGNOSIS — Z79620 Long term (current) use of immunosuppressive biologic: Secondary | ICD-10-CM | POA: Diagnosis not present

## 2024-02-10 LAB — CBC WITH DIFFERENTIAL (CANCER CENTER ONLY)
Abs Immature Granulocytes: 0.02 K/uL (ref 0.00–0.07)
Basophils Absolute: 0 K/uL (ref 0.0–0.1)
Basophils Relative: 1 %
Eosinophils Absolute: 0.1 K/uL (ref 0.0–0.5)
Eosinophils Relative: 1 %
HCT: 29.8 % — ABNORMAL LOW (ref 39.0–52.0)
Hemoglobin: 10.4 g/dL — ABNORMAL LOW (ref 13.0–17.0)
Immature Granulocytes: 1 %
Lymphocytes Relative: 10 %
Lymphs Abs: 0.4 K/uL — ABNORMAL LOW (ref 0.7–4.0)
MCH: 31.2 pg (ref 26.0–34.0)
MCHC: 34.9 g/dL (ref 30.0–36.0)
MCV: 89.5 fL (ref 80.0–100.0)
Monocytes Absolute: 0.6 K/uL (ref 0.1–1.0)
Monocytes Relative: 17 %
Neutro Abs: 2.6 K/uL (ref 1.7–7.7)
Neutrophils Relative %: 70 %
Platelet Count: 113 K/uL — ABNORMAL LOW (ref 150–400)
RBC: 3.33 MIL/uL — ABNORMAL LOW (ref 4.22–5.81)
RDW: 17.3 % — ABNORMAL HIGH (ref 11.5–15.5)
WBC Count: 3.6 K/uL — ABNORMAL LOW (ref 4.0–10.5)
nRBC: 0 % (ref 0.0–0.2)

## 2024-02-10 LAB — CMP (CANCER CENTER ONLY)
ALT: 13 U/L (ref 0–44)
AST: 32 U/L (ref 15–41)
Albumin: 3.1 g/dL — ABNORMAL LOW (ref 3.5–5.0)
Alkaline Phosphatase: 193 U/L — ABNORMAL HIGH (ref 38–126)
Anion gap: 6 (ref 5–15)
BUN: 18 mg/dL (ref 8–23)
CO2: 29 mmol/L (ref 22–32)
Calcium: 7.9 mg/dL — ABNORMAL LOW (ref 8.9–10.3)
Chloride: 97 mmol/L — ABNORMAL LOW (ref 98–111)
Creatinine: 1.64 mg/dL — ABNORMAL HIGH (ref 0.61–1.24)
GFR, Estimated: 45 mL/min — ABNORMAL LOW (ref >60)
Glucose, Bld: 158 mg/dL — ABNORMAL HIGH (ref 70–99)
Potassium: 3.1 mmol/L — ABNORMAL LOW (ref 3.5–5.1)
Sodium: 132 mmol/L — ABNORMAL LOW (ref 135–145)
Total Bilirubin: 1.1 mg/dL (ref 0.0–1.2)
Total Protein: 7.8 g/dL (ref 6.5–8.1)

## 2024-02-10 LAB — SAMPLE TO BLOOD BANK

## 2024-02-10 LAB — TSH: TSH: 4.37 u[IU]/mL (ref 0.350–4.500)

## 2024-02-10 MED ORDER — SODIUM CHLORIDE 0.9% FLUSH
10.0000 mL | INTRAVENOUS | Status: DC | PRN
Start: 2024-02-10 — End: 2024-05-07
  Administered 2024-02-10 (×2): 10 mL

## 2024-02-10 MED ORDER — PALONOSETRON HCL INJECTION 0.25 MG/5ML
0.2500 mg | Freq: Once | INTRAVENOUS | Status: AC
  Administered 2024-02-10: 0.25 mg via INTRAVENOUS
  Filled 2024-02-10: qty 5

## 2024-02-10 MED ORDER — DEXAMETHASONE SODIUM PHOSPHATE 10 MG/ML IJ SOLN
10.0000 mg | Freq: Once | INTRAMUSCULAR | Status: AC
  Administered 2024-02-10: 10 mg via INTRAVENOUS
  Filled 2024-02-10: qty 1

## 2024-02-10 MED ORDER — FAMOTIDINE IN NACL 20-0.9 MG/50ML-% IV SOLN
20.0000 mg | Freq: Once | INTRAVENOUS | Status: AC
  Administered 2024-02-10: 20 mg via INTRAVENOUS
  Filled 2024-02-10: qty 50

## 2024-02-10 MED ORDER — SODIUM CHLORIDE 0.9 % IV SOLN
350.0000 mg | Freq: Once | INTRAVENOUS | Status: AC
  Administered 2024-02-10: 350 mg via INTRAVENOUS
  Filled 2024-02-10: qty 7

## 2024-02-10 MED ORDER — DIPHENHYDRAMINE HCL 50 MG/ML IJ SOLN
50.0000 mg | Freq: Once | INTRAMUSCULAR | Status: AC
  Administered 2024-02-10: 50 mg via INTRAVENOUS
  Filled 2024-02-10: qty 1

## 2024-02-10 MED ORDER — SODIUM CHLORIDE 0.9 % IV SOLN: INTRAVENOUS | Status: DC

## 2024-02-10 MED ORDER — APREPITANT 130 MG/18ML IV EMUL
130.0000 mg | Freq: Once | INTRAVENOUS | Status: AC
  Administered 2024-02-10: 130 mg via INTRAVENOUS
  Filled 2024-02-10: qty 18

## 2024-02-10 MED ORDER — SODIUM CHLORIDE 0.9 % IV SOLN
380.0000 mg | Freq: Once | INTRAVENOUS | Status: AC
  Administered 2024-02-10: 380 mg via INTRAVENOUS
  Filled 2024-02-10: qty 38

## 2024-02-10 MED ORDER — SODIUM CHLORIDE 0.9 % IV SOLN
175.0000 mg/m2 | Freq: Once | INTRAVENOUS | Status: AC
  Administered 2024-02-10: 372 mg via INTRAVENOUS
  Filled 2024-02-10: qty 62

## 2024-02-10 NOTE — Progress Notes (Signed)
 [facility] Clinical Social Work  Initial Assessment   [name] is a 68 y.o. year old male seen in infusion. Clinical Social Work was referred by medical provider for assessment of psychosocial needs.   SDOH (Social Determinants of Health) assessments performed: Yes SDOH Interventions    Flowsheet Row Telephone from 01/07/2023 in [facility] ED to Hosp-Admission (Discharged) from 01/04/2023 in [facility]  SDOH Interventions    Food Insecurity Interventions Intervention Not Indicated --  Transportation Interventions Intervention Not Indicated  [pt reports he does not drive but gets family/friends to take him to appts and run errands-discussed [employer] transportation and instructed pt to call to inquire about coverage] Intervention Not Indicated, Inpatient TOC, Patient Resources (Friends/Family)    SDOH Screenings   Food Insecurity: No Food Insecurity (01/07/2023)  Housing: Low Risk  (01/05/2023)  Transportation Needs: No Transportation Needs (01/07/2023)  Utilities: Not At Risk (01/05/2023)  Alcohol  Screen: Low Risk  (02/23/2022)  Depression (PHQ2-9): High Risk (01/18/2023)  Financial Resource Strain: Low Risk  (02/23/2022)  Physical Activity: Inactive (01/26/2021)  Social Connections: Moderately Integrated (01/26/2021)  Stress: No Stress Concern Present (02/23/2022)  Tobacco Use: High Risk (01/26/2024)     Distress Screen completed: Yes    02/08/2024    1:28 PM  ONCBCN DISTRESS SCREENING  Screening Type Initial Screening  How much distress have you been experiencing in the past week? (0-10) 5  Emotional concerns type Worry or anxiety;Sadness or depression;Loss of interest or enjoyment  Physical Concerns Type  Tobacco use;Memory or concentration;Changes in eating;Loss or change of physical abilities      Family/Social Information:  Housing Arrangement: patient lives with his nephew. Family members/support persons in your life? Pt  reports he has a son and a daughter nearby who are able to offer assistance as needed.   Transportation concerns: no  Employment: Retired .  Income source: Actor concerns: Yes, current concerns Type of concern: Utilities and Rent/ mortgage Food access concerns: not at this time, pt informed of food pantry should concerns arise Religious or spiritual practice: Not known Advanced directives: No, pt informed of advanced directives clinic to complete documents if desired. Services Currently in place:  none  Coping/ Adjustment to diagnosis: Patient understands treatment plan and what happens next? yes Concerns about diagnosis and/or treatment: How will I care for myself and Quality of life Patient reported stressors: Finances Hopes and/or priorities: pt's priority is to continue treatment w/ the hope of positive results Patient enjoys time with family/ friends Current coping skills/ strengths: Capable of independent living , Motivation for treatment/growth , and Physical Health     SUMMARY: Current SDOH Barriers:  Financial constraints related to fixed income  Clinical Social Work Clinical Goal(s):  Explore community resource options for unmet needs related to:  Financial Strain   Interventions: Discussed common feeling and emotions when being diagnosed with cancer, and the importance of support during treatment Informed patient of the support team roles and support services at [facility] Provided CSW contact information and encouraged patient to call with any questions or concerns Pt encouraged to apply for food stamps in order to meet presumptive eligibility for the [employer].  Pt informed of the food pantry as well as transportation services should needs arise.    Follow Up Plan: Patient will contact CSW with any support or resource needs Patient verbalizes understanding of plan: Yes    Devere JONELLE Manna, LCSW Clinical Social Worker [facility]

## 2024-02-10 NOTE — Patient Instructions (Signed)
 [facility] - A DEPT OF [place].  [facility]ITAL  Discharge Instructions: Thank you for choosing [facility] to provide your oncology and hematology care.   If you have a lab appointment with the Cancer Center, please go directly to the Cancer Center and check in at the registration area.   Wear comfortable clothing and clothing appropriate for easy access to any Portacath or PICC line.   We strive to give you quality time with your provider. You may need to reschedule your appointment if you arrive late (15 or more minutes).  Arriving late affects you and other patients whose appointments are after yours.  Also, if you miss three or more appointments without notifying the office, you may be dismissed from the clinic at the provider's discretion.      For prescription refill requests, have your pharmacy contact our office and allow 72 hours for refills to be completed.    Today you received the following chemotherapy and/or immunotherapy agents: Taxol , Carboplatin , Libtayo       To help prevent nausea and vomiting after your treatment, we encourage you to take your nausea medication as directed.  BELOW ARE SYMPTOMS THAT SHOULD BE REPORTED IMMEDIATELY: *FEVER GREATER THAN 100.4 F (38 C) OR HIGHER *CHILLS OR SWEATING *NAUSEA AND VOMITING THAT IS NOT CONTROLLED WITH YOUR NAUSEA MEDICATION *UNUSUAL SHORTNESS OF BREATH *UNUSUAL BRUISING OR BLEEDING *URINARY PROBLEMS (pain or burning when urinating, or frequent urination) *BOWEL PROBLEMS (unusual diarrhea, constipation, pain near the anus) TENDERNESS IN MOUTH AND THROAT WITH OR WITHOUT PRESENCE OF ULCERS (sore throat, sores in mouth, or a toothache) UNUSUAL RASH, SWELLING OR PAIN  UNUSUAL VAGINAL DISCHARGE OR ITCHING   Items with * indicate a potential emergency and should be followed up as soon as possible or go to the Emergency Department if any problems should occur.  Please show the CHEMOTHERAPY ALERT CARD  or IMMUNOTHERAPY ALERT CARD at check-in to the Emergency Department and triage nurse.  Should you have questions after your visit or need to cancel or reschedule your appointment, please contact [facility] - A DEPT OF [facility]  Dept: [phone]  and follow the prompts.  Office hours are 8:00 a.m. to 4:30 p.m. Monday - Friday. Please note that voicemails left after 4:00 p.m. may not be returned until the following business day.  We are closed weekends and major holidays. You have access to a nurse at all times for urgent questions. Please call the main number to the clinic Dept: [phone] and follow the prompts.   For any non-urgent questions, you may also contact your provider using MyChart. We now offer e-Visits for anyone 56 and older to request care online for non-urgent symptoms. For details visit [email or web address].   Also download the MyChart app! Go to the app store, search MyChart, open the app, select [place], and log in with your MyChart username and password.

## 2024-02-10 NOTE — Telephone Encounter (Signed)
 Tried to reach patient in regards to lab results.  Per Dr. Sherrod, patients potassium is slightly low.    LVM with patient informing him of potassium rich foods and to call back with any concerns or questions.

## 2024-02-10 NOTE — Progress Notes (Signed)
 Reduce carboplatin  dose for decreased renal function per on-call provider Dr. Timmy.  Harlene Nasuti, PharmD Oncology Infusion Pharmacist 02/10/2024 11:22 AM

## 2024-02-11 LAB — T4: T4, Total: 6.1 ug/dL (ref 4.5–12.0)

## 2024-02-13 ENCOUNTER — Other Ambulatory Visit: Payer: Self-pay | Admitting: Family Medicine

## 2024-02-13 ENCOUNTER — Inpatient Hospital Stay

## 2024-02-13 VITALS — BP 117/87 | HR 104 | Temp 97.9°F | Resp 18

## 2024-02-13 DIAGNOSIS — J439 Emphysema, unspecified: Secondary | ICD-10-CM | POA: Diagnosis not present

## 2024-02-13 DIAGNOSIS — Z79630 Long term (current) use of alkylating agent: Secondary | ICD-10-CM | POA: Diagnosis not present

## 2024-02-13 DIAGNOSIS — F419 Anxiety disorder, unspecified: Secondary | ICD-10-CM | POA: Diagnosis not present

## 2024-02-13 DIAGNOSIS — L409 Psoriasis, unspecified: Secondary | ICD-10-CM

## 2024-02-13 DIAGNOSIS — Z79620 Long term (current) use of immunosuppressive biologic: Secondary | ICD-10-CM | POA: Diagnosis not present

## 2024-02-13 DIAGNOSIS — C3412 Malignant neoplasm of upper lobe, left bronchus or lung: Secondary | ICD-10-CM

## 2024-02-13 DIAGNOSIS — C3492 Malignant neoplasm of unspecified part of left bronchus or lung: Secondary | ICD-10-CM | POA: Diagnosis not present

## 2024-02-13 DIAGNOSIS — Z5189 Encounter for other specified aftercare: Secondary | ICD-10-CM | POA: Diagnosis not present

## 2024-02-13 DIAGNOSIS — C3431 Malignant neoplasm of lower lobe, right bronchus or lung: Secondary | ICD-10-CM | POA: Diagnosis not present

## 2024-02-13 DIAGNOSIS — J45909 Unspecified asthma, uncomplicated: Secondary | ICD-10-CM | POA: Diagnosis not present

## 2024-02-13 DIAGNOSIS — E1159 Type 2 diabetes mellitus with other circulatory complications: Secondary | ICD-10-CM | POA: Diagnosis not present

## 2024-02-13 DIAGNOSIS — I152 Hypertension secondary to endocrine disorders: Secondary | ICD-10-CM | POA: Diagnosis not present

## 2024-02-13 DIAGNOSIS — E440 Moderate protein-calorie malnutrition: Secondary | ICD-10-CM | POA: Diagnosis not present

## 2024-02-13 DIAGNOSIS — Z5112 Encounter for antineoplastic immunotherapy: Secondary | ICD-10-CM | POA: Diagnosis not present

## 2024-02-13 DIAGNOSIS — C3491 Malignant neoplasm of unspecified part of right bronchus or lung: Secondary | ICD-10-CM | POA: Diagnosis not present

## 2024-02-13 DIAGNOSIS — C7951 Secondary malignant neoplasm of bone: Secondary | ICD-10-CM | POA: Diagnosis not present

## 2024-02-13 DIAGNOSIS — Z5111 Encounter for antineoplastic chemotherapy: Secondary | ICD-10-CM | POA: Diagnosis not present

## 2024-02-13 DIAGNOSIS — Z79633 Long term (current) use of mitotic inhibitor: Secondary | ICD-10-CM | POA: Diagnosis not present

## 2024-02-13 MED ORDER — PEGFILGRASTIM-CBQV 6 MG/0.6ML ~~LOC~~ SOSY
6.0000 mg | PREFILLED_SYRINGE | Freq: Once | SUBCUTANEOUS | Status: AC
  Administered 2024-02-13 (×2): 6 mg via SUBCUTANEOUS
  Filled 2024-02-13: qty 0.6

## 2024-02-13 NOTE — Progress Notes (Signed)
 Pt here for first Udenyca  injection today. He had tx on Friday and is reporting general aching and feeling like he has the flu. VSS. Spoke with pharmacy to ask about side effects of chemo drugs and they stated that the aching could be a from the infusion meds. Pt informed. Also educated pt about side effects of Udenyca  injection and to take Claritin  once a day for 7 days and Tylenol  as needed per protocol. Pt also asked about lidocaine  cream and pain medication Rx. Stated to pt that pharmacy received Rx for cream but there is not a pain med rx on file or on the chart. Diane, RN notified about this via secure chat.

## 2024-02-13 NOTE — Telephone Encounter (Signed)
 Copied from CRM [id number]. Topic: Clinical - Medication Refill >> Feb 13, 2024 12:44 PM Delon T wrote: Medication: albuterol  (VENTOLIN  HFA) 108 (90 Base) MCG/ACT inhaler clobetasol  (OLUX ) 0.05 % topical foam    Has the patient contacted their pharmacy? Yes (Agent: If no, request that the patient contact the pharmacy for the refill. If patient does not wish to contact the pharmacy document the reason why and proceed with request.) (Agent: If yes, when and what did the pharmacy advise?)  This is the patient's preferred pharmacy:  [id number] [place], [place] - [address] AT [facility] OF [facility] & [address] [place] [address] Phone: [phone] Fax: [phone]    Is this the correct pharmacy for this prescription? Yes If no, delete pharmacy and type the correct one.   Has the prescription been filled recently? Yes  Is the patient out of the medication? Yes  Has the patient been seen for an appointment in the last year OR does the patient have an upcoming appointment? Yes  Can we respond through MyChart? No  Agent: Please be advised that Rx refills may take up to 3 business days. We ask that you follow-up with your pharmacy.

## 2024-02-14 ENCOUNTER — Telehealth: Payer: Self-pay | Admitting: Medical Oncology

## 2024-02-14 ENCOUNTER — Encounter: Payer: Self-pay | Admitting: Internal Medicine

## 2024-02-14 NOTE — Telephone Encounter (Addendum)
 Monday 08/11-Pt had tx on 4 days ago. He reported  to the flush nurse that he had general aching and feeling like he has the flu. Can the chemo drugs he is on cause these side effects? . He received GCSF . He was educated by the flush nurse to take Claritin  once a day for 7 days and Tylenol  as needed per protocol.

## 2024-02-15 ENCOUNTER — Other Ambulatory Visit: Payer: Self-pay | Admitting: Internal Medicine

## 2024-02-15 ENCOUNTER — Telehealth: Payer: Self-pay

## 2024-02-15 DIAGNOSIS — C3412 Malignant neoplasm of upper lobe, left bronchus or lung: Secondary | ICD-10-CM

## 2024-02-15 NOTE — Telephone Encounter (Signed)
 Patient's daughter called and left a voicemail stating that the patient is constipated and inquiring about what he can take.  Returned call and left a voicemail advising that the patient may take over-the-counter stool softeners and Miralax  as needed. Also recommended maintaining adequate hydration. Informed her to contact our office with any further questions or concerns.

## 2024-02-16 ENCOUNTER — Inpatient Hospital Stay

## 2024-02-16 ENCOUNTER — Other Ambulatory Visit

## 2024-02-16 ENCOUNTER — Telehealth: Payer: Self-pay | Admitting: Medical Oncology

## 2024-02-16 ENCOUNTER — Other Ambulatory Visit: Payer: Self-pay

## 2024-02-16 DIAGNOSIS — C3431 Malignant neoplasm of lower lobe, right bronchus or lung: Secondary | ICD-10-CM | POA: Diagnosis not present

## 2024-02-16 DIAGNOSIS — Z79620 Long term (current) use of immunosuppressive biologic: Secondary | ICD-10-CM | POA: Diagnosis not present

## 2024-02-16 DIAGNOSIS — C7951 Secondary malignant neoplasm of bone: Secondary | ICD-10-CM | POA: Diagnosis not present

## 2024-02-16 DIAGNOSIS — Z5112 Encounter for antineoplastic immunotherapy: Secondary | ICD-10-CM | POA: Diagnosis not present

## 2024-02-16 DIAGNOSIS — C3412 Malignant neoplasm of upper lobe, left bronchus or lung: Secondary | ICD-10-CM

## 2024-02-16 DIAGNOSIS — Z79633 Long term (current) use of mitotic inhibitor: Secondary | ICD-10-CM | POA: Diagnosis not present

## 2024-02-16 DIAGNOSIS — Z5111 Encounter for antineoplastic chemotherapy: Secondary | ICD-10-CM | POA: Diagnosis not present

## 2024-02-16 DIAGNOSIS — Z79630 Long term (current) use of alkylating agent: Secondary | ICD-10-CM | POA: Diagnosis not present

## 2024-02-16 DIAGNOSIS — Z5189 Encounter for other specified aftercare: Secondary | ICD-10-CM | POA: Diagnosis not present

## 2024-02-16 DIAGNOSIS — Z95828 Presence of other vascular implants and grafts: Secondary | ICD-10-CM | POA: Insufficient documentation

## 2024-02-16 LAB — CBC WITH DIFFERENTIAL (CANCER CENTER ONLY)
Abs Immature Granulocytes: 0.05 K/uL (ref 0.00–0.07)
Basophils Absolute: 0 K/uL (ref 0.0–0.1)
Basophils Relative: 1 %
Eosinophils Absolute: 0.1 K/uL (ref 0.0–0.5)
Eosinophils Relative: 6 %
HCT: 26.4 % — ABNORMAL LOW (ref 39.0–52.0)
Hemoglobin: 9.5 g/dL — ABNORMAL LOW (ref 13.0–17.0)
Immature Granulocytes: 4 %
Lymphocytes Relative: 16 %
Lymphs Abs: 0.2 K/uL — ABNORMAL LOW (ref 0.7–4.0)
MCH: 32.4 pg (ref 26.0–34.0)
MCHC: 36 g/dL (ref 30.0–36.0)
MCV: 90.1 fL (ref 80.0–100.0)
Monocytes Absolute: 0.1 K/uL (ref 0.1–1.0)
Monocytes Relative: 7 %
Neutro Abs: 0.9 K/uL — ABNORMAL LOW (ref 1.7–7.7)
Neutrophils Relative %: 66 %
Platelet Count: 65 K/uL — ABNORMAL LOW (ref 150–400)
RBC: 2.93 MIL/uL — ABNORMAL LOW (ref 4.22–5.81)
RDW: 16.4 % — ABNORMAL HIGH (ref 11.5–15.5)
Smear Review: NORMAL
WBC Count: 1.4 K/uL — ABNORMAL LOW (ref 4.0–10.5)
nRBC: 0 % (ref 0.0–0.2)

## 2024-02-16 LAB — CMP (CANCER CENTER ONLY)
ALT: 10 U/L (ref 0–44)
AST: 24 U/L (ref 15–41)
Albumin: 3.1 g/dL — ABNORMAL LOW (ref 3.5–5.0)
Alkaline Phosphatase: 166 U/L — ABNORMAL HIGH (ref 38–126)
Anion gap: 7 (ref 5–15)
BUN: 26 mg/dL — ABNORMAL HIGH (ref 8–23)
CO2: 28 mmol/L (ref 22–32)
Calcium: 7.8 mg/dL — ABNORMAL LOW (ref 8.9–10.3)
Chloride: 95 mmol/L — ABNORMAL LOW (ref 98–111)
Creatinine: 1.77 mg/dL — ABNORMAL HIGH (ref 0.61–1.24)
GFR, Estimated: 41 mL/min — ABNORMAL LOW (ref >60)
Glucose, Bld: 109 mg/dL — ABNORMAL HIGH (ref 70–99)
Potassium: 3.5 mmol/L (ref 3.5–5.1)
Sodium: 130 mmol/L — ABNORMAL LOW (ref 135–145)
Total Bilirubin: 1.5 mg/dL — ABNORMAL HIGH (ref 0.0–1.2)
Total Protein: 7.5 g/dL (ref 6.5–8.1)

## 2024-02-16 MED ORDER — SODIUM CHLORIDE 0.9% FLUSH
10.0000 mL | Freq: Once | INTRAVENOUS | Status: AC
Start: 2024-02-16 — End: 2024-02-16
  Administered 2024-02-16: 10 mL

## 2024-02-16 MED ORDER — ALBUTEROL SULFATE HFA 108 (90 BASE) MCG/ACT IN AERS: 2.0000 | INHALATION_SPRAY | Freq: Four times a day (QID) | RESPIRATORY_TRACT | 0 refills | Status: DC | PRN

## 2024-02-16 NOTE — Telephone Encounter (Signed)
 Dtr said pt does not have an appetite. Would Dr. Sherrod recommend anything.

## 2024-02-16 NOTE — Progress Notes (Signed)
 Pt's family states that pharmacy needs Dr. Sherrod to call in an override because insurance will not pay for it without his authorization. Diane, RN notified via secure chat.

## 2024-02-16 NOTE — Telephone Encounter (Signed)
 Requested medication (s) are due for refill today: yes  Requested medication (s) are on the active medication list: yes  Last refill:  01/26/24 50 grams  Future visit scheduled: no  Notes to clinic:  med not delegated to NT to RF   Requested Prescriptions  Pending Prescriptions Disp Refills   clobetasol  (OLUX ) 0.05 % topical foam 50 g     Sig: Apply topically 2 (two) times daily.     Not Delegated - Dermatology:  Corticosteroids Failed - 02/16/2024  1:03 PM      Failed - This refill cannot be delegated      Passed - Valid encounter within last 12 months    Recent Outpatient Visits           3 weeks ago Type 2 diabetes mellitus with other specified complication, without long-term current use of insulin  (HCC)   [facility] A Dept Of [place]. [facility] [name], MD   12 months ago Essential hypertension   [facility] - A Dept Of [place]. [facility] Fleeta Tonia Garnette LITTIE, RPH-CPP   1 year ago Hypomagnesemia   [facility] - A Dept Of [place]. [facility] [name], MD   1 year ago Medication management   [facility] [place] - A Dept Of [place]. [facility] Fleeta Tonia, [place] L, RPH-CPP   1 year ago Closed fracture of one rib of left side with routine healing, subsequent encounter   [facility] - A Dept Of [place]. [facility] [place], [place], PA-C              Signed Prescriptions Disp Refills   albuterol  (VENTOLIN  HFA) 108 (90 Base) MCG/ACT inhaler 6.7 g 0    Sig: Inhale 2 puffs into the lungs every 6 (six) hours as needed for wheezing or shortness of breath.     Pulmonology:  Beta Agonists 2 Passed - 02/16/2024  1:03 PM      Passed - Last BP in normal range    BP Readings from Last 1 Encounters:  02/13/24 117/87         Passed - Last Heart Rate in normal range    Pulse Readings from Last 1 Encounters:   02/13/24 (!) 104         Passed - Valid encounter within last 12 months    Recent Outpatient Visits           3 weeks ago Type 2 diabetes mellitus with other specified complication, without long-term current use of insulin  (HCC)   [place] [facility] [place] - A Dept Of [place]. [facility] [name], MD   12 months ago Essential hypertension   [facility] [facility] [place] - A Dept Of [place]. [facility] Fleeta Tonia Garnette LITTIE, RPH-CPP   1 year ago Hypomagnesemia   [place] [facility] - A Dept Of [place]. [facility] [name], MD   1 year ago Medication management   [place] [facility] [place] - A Dept Of [place]. [facility] Fleeta Tonia, [place] L, RPH-CPP   1 year ago Closed fracture of one rib of left side with routine healing, subsequent encounter   [facility] [facility] - A Dept Of [place]. [facility] [place], [place], PA-C

## 2024-02-16 NOTE — Telephone Encounter (Signed)
 Requested Prescriptions  Pending Prescriptions Disp Refills   clobetasol  (OLUX ) 0.05 % topical foam 50 g     Sig: Apply topically 2 (two) times daily.     Not Delegated - Dermatology:  Corticosteroids Failed - 02/16/2024  1:02 PM      Failed - This refill cannot be delegated      Passed - Valid encounter within last 12 months    Recent Outpatient Visits           3 weeks ago Type 2 diabetes mellitus with other specified complication, without long-term current use of insulin  (HCC)   [facility] A Dept Of [place]. [facility] [name], MD   12 months ago Essential hypertension   [facility] - A Dept Of [place]. [facility] Fleeta Tonia Garnette LITTIE, RPH-CPP   1 year ago Hypomagnesemia   [place] [facility] - A Dept Of [place]. [facility] [name], MD   1 year ago Medication management   [place] [facility] [place] - A Dept Of [place]. [facility] Fleeta Tonia, [place] L, RPH-CPP   1 year ago Closed fracture of one rib of left side with routine healing, subsequent encounter   [facility] - A Dept Of [place]. [facility], Jon M, PA-C               albuterol  (VENTOLIN  HFA) 108 (90 Base) MCG/ACT inhaler 6.7 g 0    Sig: Inhale 2 puffs into the lungs every 6 (six) hours as needed for wheezing or shortness of breath.     Pulmonology:  Beta Agonists 2 Passed - 02/16/2024  1:02 PM      Passed - Last BP in normal range    BP Readings from Last 1 Encounters:  02/13/24 117/87         Passed - Last Heart Rate in normal range    Pulse Readings from Last 1 Encounters:  02/13/24 (!) 104         Passed - Valid encounter within last 12 months    Recent Outpatient Visits           3 weeks ago Type 2 diabetes mellitus with other specified complication, without long-term current use of insulin  (HCC)   [place] [facility] [facility] - A  Dept Of [place]. [facility] [name], MD   12 months ago Essential hypertension   [place] [facility] [place] - A Dept Of [place]. [facility] Fleeta Tonia Garnette LITTIE, RPH-CPP   1 year ago Hypomagnesemia   [place] [facility] - A Dept Of [place]. [facility] [name], MD   1 year ago Medication management   [place] [facility] [place] - A Dept Of [place]. [facility] Fleeta Tonia, [place] L, RPH-CPP   1 year ago Closed fracture of one rib of left side with routine healing, subsequent encounter   [facility] [facility] - A Dept Of . [facility] [place], [place], PA-C

## 2024-02-17 ENCOUNTER — Encounter: Payer: Self-pay | Admitting: Internal Medicine

## 2024-02-17 DIAGNOSIS — C7951 Secondary malignant neoplasm of bone: Secondary | ICD-10-CM | POA: Diagnosis not present

## 2024-02-17 DIAGNOSIS — C3491 Malignant neoplasm of unspecified part of right bronchus or lung: Secondary | ICD-10-CM | POA: Diagnosis not present

## 2024-02-17 DIAGNOSIS — I152 Hypertension secondary to endocrine disorders: Secondary | ICD-10-CM | POA: Diagnosis not present

## 2024-02-17 DIAGNOSIS — F419 Anxiety disorder, unspecified: Secondary | ICD-10-CM | POA: Diagnosis not present

## 2024-02-17 DIAGNOSIS — J439 Emphysema, unspecified: Secondary | ICD-10-CM | POA: Diagnosis not present

## 2024-02-17 DIAGNOSIS — E1159 Type 2 diabetes mellitus with other circulatory complications: Secondary | ICD-10-CM | POA: Diagnosis not present

## 2024-02-17 DIAGNOSIS — C3492 Malignant neoplasm of unspecified part of left bronchus or lung: Secondary | ICD-10-CM | POA: Diagnosis not present

## 2024-02-17 DIAGNOSIS — E440 Moderate protein-calorie malnutrition: Secondary | ICD-10-CM | POA: Diagnosis not present

## 2024-02-17 DIAGNOSIS — J45909 Unspecified asthma, uncomplicated: Secondary | ICD-10-CM | POA: Diagnosis not present

## 2024-02-17 MED ORDER — CLOBETASOL PROPIONATE 0.05 % EX FOAM: Freq: Two times a day (BID) | CUTANEOUS | 0 refills | Status: DC

## 2024-02-20 ENCOUNTER — Telehealth: Payer: Self-pay | Admitting: *Deleted

## 2024-02-20 ENCOUNTER — Other Ambulatory Visit: Payer: Self-pay | Admitting: Physician Assistant

## 2024-02-20 DIAGNOSIS — C3412 Malignant neoplasm of upper lobe, left bronchus or lung: Secondary | ICD-10-CM

## 2024-02-20 NOTE — Telephone Encounter (Signed)
 [name] daughter [name] [phone]) called requesting if she is able to email FMLA form. Provided [email or web address] to email form.  Reviewed [facility] forms process and will email ROI using DocuSign.   He doesn't know anything about email.  The address you all have is my sister's email address. Appointment note added to tomorrow's visit for [name] to see this nurse to sign ROI.  Denies further questions or needs.

## 2024-02-21 ENCOUNTER — Ambulatory Visit: Admitting: Physician Assistant

## 2024-02-21 ENCOUNTER — Other Ambulatory Visit

## 2024-02-21 ENCOUNTER — Inpatient Hospital Stay

## 2024-02-21 VITALS — BP 115/76 | HR 94 | Temp 97.8°F | Resp 16 | Wt 186.5 lb

## 2024-02-21 DIAGNOSIS — C7951 Secondary malignant neoplasm of bone: Secondary | ICD-10-CM | POA: Diagnosis not present

## 2024-02-21 DIAGNOSIS — Z95828 Presence of other vascular implants and grafts: Secondary | ICD-10-CM

## 2024-02-21 DIAGNOSIS — Z5112 Encounter for antineoplastic immunotherapy: Secondary | ICD-10-CM | POA: Diagnosis not present

## 2024-02-21 DIAGNOSIS — Z79630 Long term (current) use of alkylating agent: Secondary | ICD-10-CM | POA: Diagnosis not present

## 2024-02-21 DIAGNOSIS — Z79620 Long term (current) use of immunosuppressive biologic: Secondary | ICD-10-CM | POA: Diagnosis not present

## 2024-02-21 DIAGNOSIS — Z5189 Encounter for other specified aftercare: Secondary | ICD-10-CM | POA: Diagnosis not present

## 2024-02-21 DIAGNOSIS — C3412 Malignant neoplasm of upper lobe, left bronchus or lung: Secondary | ICD-10-CM

## 2024-02-21 DIAGNOSIS — C3431 Malignant neoplasm of lower lobe, right bronchus or lung: Secondary | ICD-10-CM | POA: Diagnosis not present

## 2024-02-21 DIAGNOSIS — Z5111 Encounter for antineoplastic chemotherapy: Secondary | ICD-10-CM | POA: Diagnosis not present

## 2024-02-21 DIAGNOSIS — Z79633 Long term (current) use of mitotic inhibitor: Secondary | ICD-10-CM | POA: Diagnosis not present

## 2024-02-21 LAB — CMP (CANCER CENTER ONLY)
ALT: 16 U/L (ref 0–44)
AST: 34 U/L (ref 15–41)
Albumin: 2.9 g/dL — ABNORMAL LOW (ref 3.5–5.0)
Alkaline Phosphatase: 231 U/L — ABNORMAL HIGH (ref 38–126)
Anion gap: 7 (ref 5–15)
BUN: 18 mg/dL (ref 8–23)
CO2: 26 mmol/L (ref 22–32)
Calcium: 8.1 mg/dL — ABNORMAL LOW (ref 8.9–10.3)
Chloride: 102 mmol/L (ref 98–111)
Creatinine: 1.56 mg/dL — ABNORMAL HIGH (ref 0.61–1.24)
GFR, Estimated: 48 mL/min — ABNORMAL LOW (ref >60)
Glucose, Bld: 87 mg/dL (ref 70–99)
Potassium: 3.4 mmol/L — ABNORMAL LOW (ref 3.5–5.1)
Sodium: 135 mmol/L (ref 135–145)
Total Bilirubin: 0.6 mg/dL (ref 0.0–1.2)
Total Protein: 6.9 g/dL (ref 6.5–8.1)

## 2024-02-21 LAB — CBC WITH DIFFERENTIAL (CANCER CENTER ONLY)
Abs Immature Granulocytes: 2.53 K/uL — ABNORMAL HIGH (ref 0.00–0.07)
Basophils Absolute: 0.1 K/uL (ref 0.0–0.1)
Basophils Relative: 0 %
Eosinophils Absolute: 0.1 K/uL (ref 0.0–0.5)
Eosinophils Relative: 0 %
HCT: 25.3 % — ABNORMAL LOW (ref 39.0–52.0)
Hemoglobin: 9.3 g/dL — ABNORMAL LOW (ref 13.0–17.0)
Immature Granulocytes: 12 %
Lymphocytes Relative: 5 %
Lymphs Abs: 1.1 K/uL (ref 0.7–4.0)
MCH: 33.6 pg (ref 26.0–34.0)
MCHC: 36.8 g/dL — ABNORMAL HIGH (ref 30.0–36.0)
MCV: 91.3 fL (ref 80.0–100.0)
Monocytes Absolute: 1.6 K/uL — ABNORMAL HIGH (ref 0.1–1.0)
Monocytes Relative: 8 %
Neutro Abs: 15.5 K/uL — ABNORMAL HIGH (ref 1.7–7.7)
Neutrophils Relative %: 75 %
Platelet Count: 112 K/uL — ABNORMAL LOW (ref 150–400)
RBC: 2.77 MIL/uL — ABNORMAL LOW (ref 4.22–5.81)
RDW: 17.9 % — ABNORMAL HIGH (ref 11.5–15.5)
WBC Count: 20.8 K/uL — ABNORMAL HIGH (ref 4.0–10.5)
nRBC: 0.1 % (ref 0.0–0.2)

## 2024-02-21 LAB — SAMPLE TO BLOOD BANK

## 2024-02-21 MED ORDER — SODIUM CHLORIDE 0.9% FLUSH
10.0000 mL | Freq: Once | INTRAVENOUS | Status: AC
  Administered 2024-02-21: 10 mL

## 2024-02-22 DIAGNOSIS — J439 Emphysema, unspecified: Secondary | ICD-10-CM | POA: Diagnosis not present

## 2024-02-22 DIAGNOSIS — J45909 Unspecified asthma, uncomplicated: Secondary | ICD-10-CM | POA: Diagnosis not present

## 2024-02-22 DIAGNOSIS — E440 Moderate protein-calorie malnutrition: Secondary | ICD-10-CM | POA: Diagnosis not present

## 2024-02-22 DIAGNOSIS — C3492 Malignant neoplasm of unspecified part of left bronchus or lung: Secondary | ICD-10-CM | POA: Diagnosis not present

## 2024-02-22 DIAGNOSIS — C7951 Secondary malignant neoplasm of bone: Secondary | ICD-10-CM | POA: Diagnosis not present

## 2024-02-22 DIAGNOSIS — F419 Anxiety disorder, unspecified: Secondary | ICD-10-CM | POA: Diagnosis not present

## 2024-02-22 DIAGNOSIS — I152 Hypertension secondary to endocrine disorders: Secondary | ICD-10-CM | POA: Diagnosis not present

## 2024-02-22 DIAGNOSIS — E1159 Type 2 diabetes mellitus with other circulatory complications: Secondary | ICD-10-CM | POA: Diagnosis not present

## 2024-02-22 DIAGNOSIS — C3491 Malignant neoplasm of unspecified part of right bronchus or lung: Secondary | ICD-10-CM | POA: Diagnosis not present

## 2024-02-23 ENCOUNTER — Other Ambulatory Visit

## 2024-02-24 DIAGNOSIS — I152 Hypertension secondary to endocrine disorders: Secondary | ICD-10-CM | POA: Diagnosis not present

## 2024-02-24 DIAGNOSIS — J439 Emphysema, unspecified: Secondary | ICD-10-CM | POA: Diagnosis not present

## 2024-02-24 DIAGNOSIS — C7951 Secondary malignant neoplasm of bone: Secondary | ICD-10-CM | POA: Diagnosis not present

## 2024-02-24 DIAGNOSIS — J45909 Unspecified asthma, uncomplicated: Secondary | ICD-10-CM | POA: Diagnosis not present

## 2024-02-24 DIAGNOSIS — E1159 Type 2 diabetes mellitus with other circulatory complications: Secondary | ICD-10-CM | POA: Diagnosis not present

## 2024-02-24 DIAGNOSIS — C3492 Malignant neoplasm of unspecified part of left bronchus or lung: Secondary | ICD-10-CM | POA: Diagnosis not present

## 2024-02-24 DIAGNOSIS — C3491 Malignant neoplasm of unspecified part of right bronchus or lung: Secondary | ICD-10-CM | POA: Diagnosis not present

## 2024-02-24 DIAGNOSIS — F419 Anxiety disorder, unspecified: Secondary | ICD-10-CM | POA: Diagnosis not present

## 2024-02-24 DIAGNOSIS — E440 Moderate protein-calorie malnutrition: Secondary | ICD-10-CM | POA: Diagnosis not present

## 2024-02-27 DIAGNOSIS — E1159 Type 2 diabetes mellitus with other circulatory complications: Secondary | ICD-10-CM | POA: Diagnosis not present

## 2024-02-27 DIAGNOSIS — I152 Hypertension secondary to endocrine disorders: Secondary | ICD-10-CM | POA: Diagnosis not present

## 2024-02-27 DIAGNOSIS — C3492 Malignant neoplasm of unspecified part of left bronchus or lung: Secondary | ICD-10-CM | POA: Diagnosis not present

## 2024-02-27 DIAGNOSIS — J45909 Unspecified asthma, uncomplicated: Secondary | ICD-10-CM | POA: Diagnosis not present

## 2024-02-27 DIAGNOSIS — E440 Moderate protein-calorie malnutrition: Secondary | ICD-10-CM | POA: Diagnosis not present

## 2024-02-27 DIAGNOSIS — F419 Anxiety disorder, unspecified: Secondary | ICD-10-CM | POA: Diagnosis not present

## 2024-02-27 DIAGNOSIS — C3491 Malignant neoplasm of unspecified part of right bronchus or lung: Secondary | ICD-10-CM | POA: Diagnosis not present

## 2024-02-27 DIAGNOSIS — C7951 Secondary malignant neoplasm of bone: Secondary | ICD-10-CM | POA: Diagnosis not present

## 2024-02-27 DIAGNOSIS — J439 Emphysema, unspecified: Secondary | ICD-10-CM | POA: Diagnosis not present

## 2024-03-01 ENCOUNTER — Other Ambulatory Visit

## 2024-03-01 ENCOUNTER — Inpatient Hospital Stay

## 2024-03-01 ENCOUNTER — Inpatient Hospital Stay (HOSPITAL_BASED_OUTPATIENT_CLINIC_OR_DEPARTMENT_OTHER): Admitting: Internal Medicine

## 2024-03-01 ENCOUNTER — Telehealth: Payer: Self-pay | Admitting: *Deleted

## 2024-03-01 ENCOUNTER — Encounter: Payer: Self-pay | Admitting: Internal Medicine

## 2024-03-01 VITALS — BP 128/88 | HR 89 | Temp 98.0°F | Resp 17 | Ht 74.0 in | Wt 182.1 lb

## 2024-03-01 DIAGNOSIS — Z79633 Long term (current) use of mitotic inhibitor: Secondary | ICD-10-CM | POA: Diagnosis not present

## 2024-03-01 DIAGNOSIS — Z79620 Long term (current) use of immunosuppressive biologic: Secondary | ICD-10-CM | POA: Diagnosis not present

## 2024-03-01 DIAGNOSIS — Z5111 Encounter for antineoplastic chemotherapy: Secondary | ICD-10-CM | POA: Diagnosis not present

## 2024-03-01 DIAGNOSIS — Z79630 Long term (current) use of alkylating agent: Secondary | ICD-10-CM | POA: Diagnosis not present

## 2024-03-01 DIAGNOSIS — Z5189 Encounter for other specified aftercare: Secondary | ICD-10-CM | POA: Diagnosis not present

## 2024-03-01 DIAGNOSIS — C3412 Malignant neoplasm of upper lobe, left bronchus or lung: Secondary | ICD-10-CM

## 2024-03-01 DIAGNOSIS — Z5112 Encounter for antineoplastic immunotherapy: Secondary | ICD-10-CM | POA: Diagnosis not present

## 2024-03-01 DIAGNOSIS — C3431 Malignant neoplasm of lower lobe, right bronchus or lung: Secondary | ICD-10-CM | POA: Diagnosis not present

## 2024-03-01 DIAGNOSIS — C7951 Secondary malignant neoplasm of bone: Secondary | ICD-10-CM | POA: Diagnosis not present

## 2024-03-01 LAB — CBC WITH DIFFERENTIAL (CANCER CENTER ONLY)
Abs Immature Granulocytes: 0.08 K/uL — ABNORMAL HIGH (ref 0.00–0.07)
Basophils Absolute: 0.1 K/uL (ref 0.0–0.1)
Basophils Relative: 2 %
Eosinophils Absolute: 0 K/uL (ref 0.0–0.5)
Eosinophils Relative: 1 %
HCT: 26.9 % — ABNORMAL LOW (ref 39.0–52.0)
Hemoglobin: 8.9 g/dL — ABNORMAL LOW (ref 13.0–17.0)
Immature Granulocytes: 2 %
Lymphocytes Relative: 13 %
Lymphs Abs: 0.6 K/uL — ABNORMAL LOW (ref 0.7–4.0)
MCH: 29.4 pg (ref 26.0–34.0)
MCHC: 33.1 g/dL (ref 30.0–36.0)
MCV: 88.8 fL (ref 80.0–100.0)
Monocytes Absolute: 0.5 K/uL (ref 0.1–1.0)
Monocytes Relative: 10 %
Neutro Abs: 3.7 K/uL (ref 1.7–7.7)
Neutrophils Relative %: 72 %
Platelet Count: 106 K/uL — ABNORMAL LOW (ref 150–400)
RBC: 3.03 MIL/uL — ABNORMAL LOW (ref 4.22–5.81)
RDW: 17.5 % — ABNORMAL HIGH (ref 11.5–15.5)
WBC Count: 5 K/uL (ref 4.0–10.5)
nRBC: 0 % (ref 0.0–0.2)

## 2024-03-01 LAB — SAMPLE TO BLOOD BANK

## 2024-03-01 LAB — CMP (CANCER CENTER ONLY)
ALT: 18 U/L (ref 0–44)
AST: 38 U/L (ref 15–41)
Albumin: 2.8 g/dL — ABNORMAL LOW (ref 3.5–5.0)
Alkaline Phosphatase: 284 U/L — ABNORMAL HIGH (ref 38–126)
Anion gap: 5 (ref 5–15)
BUN: 23 mg/dL (ref 8–23)
CO2: 26 mmol/L (ref 22–32)
Calcium: 8.5 mg/dL — ABNORMAL LOW (ref 8.9–10.3)
Chloride: 104 mmol/L (ref 98–111)
Creatinine: 1.53 mg/dL — ABNORMAL HIGH (ref 0.61–1.24)
GFR, Estimated: 49 mL/min — ABNORMAL LOW (ref >60)
Glucose, Bld: 95 mg/dL (ref 70–99)
Potassium: 4.3 mmol/L (ref 3.5–5.1)
Sodium: 135 mmol/L (ref 135–145)
Total Bilirubin: 0.6 mg/dL (ref 0.0–1.2)
Total Protein: 7.4 g/dL (ref 6.5–8.1)

## 2024-03-01 MED ORDER — CETIRIZINE HCL 10 MG/ML IV SOLN
10.0000 mg | Freq: Once | INTRAVENOUS | Status: AC
  Administered 2024-03-01: 10 mg via INTRAVENOUS
  Filled 2024-03-01: qty 1

## 2024-03-01 MED ORDER — SODIUM CHLORIDE 0.9 % IV SOLN: INTRAVENOUS | Status: DC

## 2024-03-01 MED ORDER — FAMOTIDINE IN NACL 20-0.9 MG/50ML-% IV SOLN
20.0000 mg | Freq: Once | INTRAVENOUS | Status: AC
  Administered 2024-03-01: 20 mg via INTRAVENOUS
  Filled 2024-03-01: qty 50

## 2024-03-01 MED ORDER — SODIUM CHLORIDE 0.9 % IV SOLN
150.0000 mg/m2 | Freq: Once | INTRAVENOUS | Status: AC
  Administered 2024-03-01: 318 mg via INTRAVENOUS
  Filled 2024-03-01: qty 53

## 2024-03-01 MED ORDER — PALONOSETRON HCL INJECTION 0.25 MG/5ML
0.2500 mg | Freq: Once | INTRAVENOUS | Status: AC
  Administered 2024-03-01: 0.25 mg via INTRAVENOUS
  Filled 2024-03-01: qty 5

## 2024-03-01 MED ORDER — APREPITANT 130 MG/18ML IV EMUL
130.0000 mg | Freq: Once | INTRAVENOUS | Status: AC
  Administered 2024-03-01: 130 mg via INTRAVENOUS
  Filled 2024-03-01: qty 18

## 2024-03-01 MED ORDER — SODIUM CHLORIDE 0.9 % IV SOLN
350.0000 mg | Freq: Once | INTRAVENOUS | Status: AC
  Administered 2024-03-01: 350 mg via INTRAVENOUS
  Filled 2024-03-01: qty 7

## 2024-03-01 MED ORDER — SODIUM CHLORIDE 0.9 % IV SOLN: 175.0000 mg/m2 | Freq: Once | INTRAVENOUS | Status: DC

## 2024-03-01 MED ORDER — DEXAMETHASONE SODIUM PHOSPHATE 10 MG/ML IJ SOLN
10.0000 mg | Freq: Once | INTRAMUSCULAR | Status: AC
  Administered 2024-03-01: 10 mg via INTRAVENOUS
  Filled 2024-03-01: qty 1

## 2024-03-01 MED ORDER — SODIUM CHLORIDE 0.9 % IV SOLN
380.0000 mg | Freq: Once | INTRAVENOUS | Status: AC
  Administered 2024-03-01: 380 mg via INTRAVENOUS
  Filled 2024-03-01: qty 38
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
## 2024-03-01 NOTE — Patient Instructions (Signed)
 [facility] - A DEPT OF [place].  [facility]ITAL  Discharge Instructions: Thank you for choosing [facility] to provide your oncology and hematology care.   If you have a lab appointment with the Cancer Center, please go directly to the Cancer Center and check in at the registration area.   Wear comfortable clothing and clothing appropriate for easy access to any Portacath or PICC line.   We strive to give you quality time with your provider. You may need to reschedule your appointment if you arrive late (15 or more minutes).  Arriving late affects you and other patients whose appointments are after yours.  Also, if you miss three or more appointments without notifying the office, you may be dismissed from the clinic at the provider's discretion.      For prescription refill requests, have your pharmacy contact our office and allow 72 hours for refills to be completed.    Today you received the following chemotherapy and/or immunotherapy agents: Taxol , Carboplatin , Libtayo       To help prevent nausea and vomiting after your treatment, we encourage you to take your nausea medication as directed.  BELOW ARE SYMPTOMS THAT SHOULD BE REPORTED IMMEDIATELY: *FEVER GREATER THAN 100.4 F (38 C) OR HIGHER *CHILLS OR SWEATING *NAUSEA AND VOMITING THAT IS NOT CONTROLLED WITH YOUR NAUSEA MEDICATION *UNUSUAL SHORTNESS OF BREATH *UNUSUAL BRUISING OR BLEEDING *URINARY PROBLEMS (pain or burning when urinating, or frequent urination) *BOWEL PROBLEMS (unusual diarrhea, constipation, pain near the anus) TENDERNESS IN MOUTH AND THROAT WITH OR WITHOUT PRESENCE OF ULCERS (sore throat, sores in mouth, or a toothache) UNUSUAL RASH, SWELLING OR PAIN  UNUSUAL VAGINAL DISCHARGE OR ITCHING   Items with * indicate a potential emergency and should be followed up as soon as possible or go to the Emergency Department if any problems should occur.  Please show the CHEMOTHERAPY ALERT CARD  or IMMUNOTHERAPY ALERT CARD at check-in to the Emergency Department and triage nurse.  Should you have questions after your visit or need to cancel or reschedule your appointment, please contact [facility] - A DEPT OF [facility]  Dept: [phone]  and follow the prompts.  Office hours are 8:00 a.m. to 4:30 p.m. Monday - Friday. Please note that voicemails left after 4:00 p.m. may not be returned until the following business day.  We are closed weekends and major holidays. You have access to a nurse at all times for urgent questions. Please call the main number to the clinic Dept: [phone] and follow the prompts.   For any non-urgent questions, you may also contact your provider using MyChart. We now offer e-Visits for anyone 56 and older to request care online for non-urgent symptoms. For details visit [email or web address].   Also download the MyChart app! Go to the app store, search MyChart, open the app, select [place], and log in with your MyChart username and password.

## 2024-03-01 NOTE — Telephone Encounter (Signed)
 Key: [id number] For Lidocaine -Prilocaine  2.5-2.5% cream Available without authorization. Per [employer] Electronic PA Form  Connected with [facility]  Available without Prior Authorization.  No override needed.  Was picked up 02/20/2024 per pharmacy.

## 2024-03-01 NOTE — Progress Notes (Signed)
 OK to keep Carboplatin  dose at 380 mg per Dr. Sherrod.  Suan Pyeatt, Pharm.D., CPP 03/01/2024@12 :02 PM

## 2024-03-01 NOTE — Telephone Encounter (Signed)
-----   Message from Nurse Diane B sent at 02/16/2024  4:31 PM EDT ----- Regarding: emla  cream needs override Roz , Can you help with this?  Thank, Diane

## 2024-03-01 NOTE — Progress Notes (Signed)
 Dose reduce Paclitaxel  to 150 mg/m2 per Dr. Sherrod.  Karne Ozga, Pharm.D., CPP 03/01/2024@11 :53 AM

## 2024-03-03 ENCOUNTER — Inpatient Hospital Stay

## 2024-03-03 VITALS — BP 104/72 | HR 98 | Temp 97.3°F | Resp 16

## 2024-03-03 DIAGNOSIS — Z79630 Long term (current) use of alkylating agent: Secondary | ICD-10-CM | POA: Diagnosis not present

## 2024-03-03 DIAGNOSIS — C3431 Malignant neoplasm of lower lobe, right bronchus or lung: Secondary | ICD-10-CM | POA: Diagnosis not present

## 2024-03-03 DIAGNOSIS — Z5112 Encounter for antineoplastic immunotherapy: Secondary | ICD-10-CM | POA: Diagnosis not present

## 2024-03-03 DIAGNOSIS — Z5189 Encounter for other specified aftercare: Secondary | ICD-10-CM | POA: Diagnosis not present

## 2024-03-03 DIAGNOSIS — Z79620 Long term (current) use of immunosuppressive biologic: Secondary | ICD-10-CM | POA: Diagnosis not present

## 2024-03-03 DIAGNOSIS — C7951 Secondary malignant neoplasm of bone: Secondary | ICD-10-CM | POA: Diagnosis not present

## 2024-03-03 DIAGNOSIS — C3412 Malignant neoplasm of upper lobe, left bronchus or lung: Secondary | ICD-10-CM

## 2024-03-03 DIAGNOSIS — Z79633 Long term (current) use of mitotic inhibitor: Secondary | ICD-10-CM | POA: Diagnosis not present

## 2024-03-03 DIAGNOSIS — Z5111 Encounter for antineoplastic chemotherapy: Secondary | ICD-10-CM | POA: Diagnosis not present

## 2024-03-03 MED ORDER — PEGFILGRASTIM-CBQV 6 MG/0.6ML ~~LOC~~ SOSY
6.0000 mg | PREFILLED_SYRINGE | Freq: Once | SUBCUTANEOUS | Status: AC
Start: 2024-03-03 — End: 2024-03-03
  Administered 2024-03-03: 6 mg via SUBCUTANEOUS
  Filled 2024-03-03: qty 0.6

## 2024-03-06 DIAGNOSIS — J439 Emphysema, unspecified: Secondary | ICD-10-CM | POA: Diagnosis not present

## 2024-03-06 DIAGNOSIS — C3491 Malignant neoplasm of unspecified part of right bronchus or lung: Secondary | ICD-10-CM | POA: Diagnosis not present

## 2024-03-06 DIAGNOSIS — E440 Moderate protein-calorie malnutrition: Secondary | ICD-10-CM | POA: Diagnosis not present

## 2024-03-06 DIAGNOSIS — C3492 Malignant neoplasm of unspecified part of left bronchus or lung: Secondary | ICD-10-CM | POA: Diagnosis not present

## 2024-03-06 DIAGNOSIS — E1159 Type 2 diabetes mellitus with other circulatory complications: Secondary | ICD-10-CM | POA: Diagnosis not present

## 2024-03-06 DIAGNOSIS — C7951 Secondary malignant neoplasm of bone: Secondary | ICD-10-CM | POA: Diagnosis not present

## 2024-03-06 DIAGNOSIS — J45909 Unspecified asthma, uncomplicated: Secondary | ICD-10-CM | POA: Diagnosis not present

## 2024-03-06 DIAGNOSIS — F419 Anxiety disorder, unspecified: Secondary | ICD-10-CM | POA: Diagnosis not present

## 2024-03-06 DIAGNOSIS — I152 Hypertension secondary to endocrine disorders: Secondary | ICD-10-CM | POA: Diagnosis not present

## 2024-03-07 ENCOUNTER — Other Ambulatory Visit

## 2024-03-07 ENCOUNTER — Telehealth: Payer: Self-pay | Admitting: Family Medicine

## 2024-03-07 ENCOUNTER — Inpatient Hospital Stay: Attending: Physician Assistant

## 2024-03-07 DIAGNOSIS — C3431 Malignant neoplasm of lower lobe, right bronchus or lung: Secondary | ICD-10-CM | POA: Insufficient documentation

## 2024-03-07 DIAGNOSIS — Z79633 Long term (current) use of mitotic inhibitor: Secondary | ICD-10-CM | POA: Insufficient documentation

## 2024-03-07 DIAGNOSIS — Z87891 Personal history of nicotine dependence: Secondary | ICD-10-CM | POA: Diagnosis not present

## 2024-03-07 DIAGNOSIS — Z5189 Encounter for other specified aftercare: Secondary | ICD-10-CM | POA: Diagnosis not present

## 2024-03-07 DIAGNOSIS — Z5111 Encounter for antineoplastic chemotherapy: Secondary | ICD-10-CM | POA: Diagnosis not present

## 2024-03-07 DIAGNOSIS — Z5112 Encounter for antineoplastic immunotherapy: Secondary | ICD-10-CM | POA: Insufficient documentation

## 2024-03-07 DIAGNOSIS — Z79620 Long term (current) use of immunosuppressive biologic: Secondary | ICD-10-CM | POA: Diagnosis not present

## 2024-03-07 DIAGNOSIS — C3412 Malignant neoplasm of upper lobe, left bronchus or lung: Secondary | ICD-10-CM

## 2024-03-07 DIAGNOSIS — C7951 Secondary malignant neoplasm of bone: Secondary | ICD-10-CM | POA: Diagnosis not present

## 2024-03-07 LAB — CMP (CANCER CENTER ONLY)
ALT: 20 U/L (ref 0–44)
AST: 28 U/L (ref 15–41)
Albumin: 3 g/dL — ABNORMAL LOW (ref 3.5–5.0)
Alkaline Phosphatase: 231 U/L — ABNORMAL HIGH (ref 38–126)
Anion gap: 5 (ref 5–15)
BUN: 21 mg/dL (ref 8–23)
CO2: 24 mmol/L (ref 22–32)
Calcium: 8.6 mg/dL — ABNORMAL LOW (ref 8.9–10.3)
Chloride: 102 mmol/L (ref 98–111)
Creatinine: 1.28 mg/dL — ABNORMAL HIGH (ref 0.61–1.24)
GFR, Estimated: >60 mL/min (ref >60)
Glucose, Bld: 99 mg/dL (ref 70–99)
Potassium: 4.3 mmol/L (ref 3.5–5.1)
Sodium: 131 mmol/L — ABNORMAL LOW (ref 135–145)
Total Bilirubin: 1 mg/dL (ref 0.0–1.2)
Total Protein: 7.8 g/dL (ref 6.5–8.1)

## 2024-03-07 LAB — CBC WITH DIFFERENTIAL (CANCER CENTER ONLY)
Abs Immature Granulocytes: 0.44 K/uL — ABNORMAL HIGH (ref 0.00–0.07)
Basophils Absolute: 0.1 K/uL (ref 0.0–0.1)
Basophils Relative: 2 %
Eosinophils Absolute: 0.2 K/uL (ref 0.0–0.5)
Eosinophils Relative: 3 %
HCT: 23.6 % — ABNORMAL LOW (ref 39.0–52.0)
Hemoglobin: 8.1 g/dL — ABNORMAL LOW (ref 13.0–17.0)
Immature Granulocytes: 8 %
Lymphocytes Relative: 10 %
Lymphs Abs: 0.6 K/uL — ABNORMAL LOW (ref 0.7–4.0)
MCH: 29.9 pg (ref 26.0–34.0)
MCHC: 34.3 g/dL (ref 30.0–36.0)
MCV: 87.1 fL (ref 80.0–100.0)
Monocytes Absolute: 0.1 K/uL (ref 0.1–1.0)
Monocytes Relative: 2 %
Neutro Abs: 4.2 K/uL (ref 1.7–7.7)
Neutrophils Relative %: 75 %
Platelet Count: 82 K/uL — ABNORMAL LOW (ref 150–400)
RBC: 2.71 MIL/uL — ABNORMAL LOW (ref 4.22–5.81)
RDW: 17.2 % — ABNORMAL HIGH (ref 11.5–15.5)
WBC Count: 5.5 K/uL (ref 4.0–10.5)
nRBC: 0 % (ref 0.0–0.2)

## 2024-03-07 LAB — SAMPLE TO BLOOD BANK

## 2024-03-07 NOTE — Telephone Encounter (Signed)
 Pt confirmed appt (per vr) 9/2

## 2024-03-08 ENCOUNTER — Encounter: Payer: Self-pay | Admitting: Family Medicine

## 2024-03-08 ENCOUNTER — Ambulatory Visit: Attending: Family Medicine | Admitting: Family Medicine

## 2024-03-08 VITALS — BP 95/62 | HR 99 | Ht 74.0 in | Wt 183.6 lb

## 2024-03-08 DIAGNOSIS — G621 Alcoholic polyneuropathy: Secondary | ICD-10-CM

## 2024-03-08 DIAGNOSIS — J439 Emphysema, unspecified: Secondary | ICD-10-CM | POA: Diagnosis not present

## 2024-03-08 DIAGNOSIS — J45909 Unspecified asthma, uncomplicated: Secondary | ICD-10-CM | POA: Diagnosis not present

## 2024-03-08 DIAGNOSIS — I152 Hypertension secondary to endocrine disorders: Secondary | ICD-10-CM

## 2024-03-08 DIAGNOSIS — R413 Other amnesia: Secondary | ICD-10-CM | POA: Diagnosis not present

## 2024-03-08 DIAGNOSIS — E1149 Type 2 diabetes mellitus with other diabetic neurological complication: Secondary | ICD-10-CM

## 2024-03-08 DIAGNOSIS — C3491 Malignant neoplasm of unspecified part of right bronchus or lung: Secondary | ICD-10-CM | POA: Diagnosis not present

## 2024-03-08 DIAGNOSIS — F419 Anxiety disorder, unspecified: Secondary | ICD-10-CM | POA: Diagnosis not present

## 2024-03-08 DIAGNOSIS — E440 Moderate protein-calorie malnutrition: Secondary | ICD-10-CM | POA: Diagnosis not present

## 2024-03-08 DIAGNOSIS — E1159 Type 2 diabetes mellitus with other circulatory complications: Secondary | ICD-10-CM | POA: Diagnosis not present

## 2024-03-08 DIAGNOSIS — C7951 Secondary malignant neoplasm of bone: Secondary | ICD-10-CM | POA: Diagnosis not present

## 2024-03-08 DIAGNOSIS — C3492 Malignant neoplasm of unspecified part of left bronchus or lung: Secondary | ICD-10-CM | POA: Diagnosis not present

## 2024-03-08 DIAGNOSIS — M79676 Pain in unspecified toe(s): Secondary | ICD-10-CM | POA: Diagnosis not present

## 2024-03-08 MED ORDER — AMLODIPINE BESYLATE 5 MG PO TABS: 5.0000 mg | ORAL_TABLET | Freq: Every day | ORAL | Status: DC

## 2024-03-08 NOTE — Patient Instructions (Signed)
 VISIT SUMMARY:  During your visit, we discussed your ongoing issues with numbness in your hands and feet, fluctuations in blood pressure, intermittent pain in your toes, and memory issues. We reviewed your current medications and made some adjustments to better manage your symptoms.  YOUR PLAN:  -ALCOHOLIC PERIPHERAL NEUROPATHY: This condition is likely caused by past alcohol  use and cancer treatment, leading to numbness in your hands and feet. Unfortunately, this type of neuropathy may be irreversible. Continue taking gabapentin  twice daily, and we may consider adjusting the dose if your symptoms do not improve.  -HYPERTENSION: Your blood pressure has been fluctuating, possibly due to inconsistent medication adherence. We have reduced your amlodipine  dose to 5 mg daily to help prevent low blood pressure and dizziness. Please use a pill cutter to split your current 10 mg tablets and monitor your blood pressure regularly.  -PAIN IN TOES, RULE OUT GOUT: The intermittent pain in your big toes could be due to gout or neuropathy. We have ordered a uric acid blood test to investigate further and referred you to a podiatrist for toenail care and additional evaluation.  -MEMORY LOSS: Your short-term memory loss may be worsened by your medication regimen and frequent healthcare appointments. It is important for your caregivers to communicate directly with your healthcare providers to help manage this issue.  INSTRUCTIONS:  Please follow up with the cancer center for your uric acid blood test on September 11. Additionally, schedule an appointment with a podiatrist for toenail care and further evaluation of your toe pain. Continue monitoring your blood pressure regularly and use a pill cutter to adjust your amlodipine  dose as discussed.
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
## 2024-03-09 ENCOUNTER — Other Ambulatory Visit: Payer: Self-pay

## 2024-03-09 DIAGNOSIS — J45909 Unspecified asthma, uncomplicated: Secondary | ICD-10-CM | POA: Diagnosis not present

## 2024-03-09 DIAGNOSIS — C7951 Secondary malignant neoplasm of bone: Secondary | ICD-10-CM | POA: Diagnosis not present

## 2024-03-09 DIAGNOSIS — J439 Emphysema, unspecified: Secondary | ICD-10-CM | POA: Diagnosis not present

## 2024-03-09 DIAGNOSIS — C3491 Malignant neoplasm of unspecified part of right bronchus or lung: Secondary | ICD-10-CM | POA: Diagnosis not present

## 2024-03-09 DIAGNOSIS — E440 Moderate protein-calorie malnutrition: Secondary | ICD-10-CM | POA: Diagnosis not present

## 2024-03-09 DIAGNOSIS — E1159 Type 2 diabetes mellitus with other circulatory complications: Secondary | ICD-10-CM | POA: Diagnosis not present

## 2024-03-09 DIAGNOSIS — I152 Hypertension secondary to endocrine disorders: Secondary | ICD-10-CM | POA: Diagnosis not present

## 2024-03-09 DIAGNOSIS — C3492 Malignant neoplasm of unspecified part of left bronchus or lung: Secondary | ICD-10-CM | POA: Diagnosis not present

## 2024-03-09 DIAGNOSIS — F419 Anxiety disorder, unspecified: Secondary | ICD-10-CM | POA: Diagnosis not present

## 2024-03-12 ENCOUNTER — Encounter: Payer: Self-pay | Admitting: Internal Medicine

## 2024-03-12 ENCOUNTER — Telehealth: Payer: Self-pay | Admitting: *Deleted

## 2024-03-12 NOTE — Telephone Encounter (Signed)
 Completed [name] daughter FMLA paperwork   Sent to provider to review, amend, sign and return to this nurse to return to claims benefit manager.

## 2024-03-13 DIAGNOSIS — C3492 Malignant neoplasm of unspecified part of left bronchus or lung: Secondary | ICD-10-CM | POA: Diagnosis not present

## 2024-03-13 DIAGNOSIS — F419 Anxiety disorder, unspecified: Secondary | ICD-10-CM | POA: Diagnosis not present

## 2024-03-13 DIAGNOSIS — C7951 Secondary malignant neoplasm of bone: Secondary | ICD-10-CM | POA: Diagnosis not present

## 2024-03-13 DIAGNOSIS — C3491 Malignant neoplasm of unspecified part of right bronchus or lung: Secondary | ICD-10-CM | POA: Diagnosis not present

## 2024-03-13 DIAGNOSIS — E1159 Type 2 diabetes mellitus with other circulatory complications: Secondary | ICD-10-CM | POA: Diagnosis not present

## 2024-03-13 DIAGNOSIS — J439 Emphysema, unspecified: Secondary | ICD-10-CM | POA: Diagnosis not present

## 2024-03-13 DIAGNOSIS — J45909 Unspecified asthma, uncomplicated: Secondary | ICD-10-CM | POA: Diagnosis not present

## 2024-03-13 DIAGNOSIS — E440 Moderate protein-calorie malnutrition: Secondary | ICD-10-CM | POA: Diagnosis not present

## 2024-03-13 DIAGNOSIS — I152 Hypertension secondary to endocrine disorders: Secondary | ICD-10-CM | POA: Diagnosis not present

## 2024-03-15 ENCOUNTER — Other Ambulatory Visit

## 2024-03-15 ENCOUNTER — Inpatient Hospital Stay

## 2024-03-15 ENCOUNTER — Other Ambulatory Visit: Payer: Self-pay | Admitting: Physician Assistant

## 2024-03-15 DIAGNOSIS — C7951 Secondary malignant neoplasm of bone: Secondary | ICD-10-CM | POA: Diagnosis not present

## 2024-03-15 DIAGNOSIS — E440 Moderate protein-calorie malnutrition: Secondary | ICD-10-CM | POA: Diagnosis not present

## 2024-03-15 DIAGNOSIS — E1159 Type 2 diabetes mellitus with other circulatory complications: Secondary | ICD-10-CM | POA: Diagnosis not present

## 2024-03-15 DIAGNOSIS — J439 Emphysema, unspecified: Secondary | ICD-10-CM | POA: Diagnosis not present

## 2024-03-15 DIAGNOSIS — C3412 Malignant neoplasm of upper lobe, left bronchus or lung: Secondary | ICD-10-CM

## 2024-03-15 DIAGNOSIS — C3492 Malignant neoplasm of unspecified part of left bronchus or lung: Secondary | ICD-10-CM | POA: Diagnosis not present

## 2024-03-15 DIAGNOSIS — F419 Anxiety disorder, unspecified: Secondary | ICD-10-CM | POA: Diagnosis not present

## 2024-03-15 DIAGNOSIS — Z5112 Encounter for antineoplastic immunotherapy: Secondary | ICD-10-CM | POA: Diagnosis not present

## 2024-03-15 DIAGNOSIS — J45909 Unspecified asthma, uncomplicated: Secondary | ICD-10-CM | POA: Diagnosis not present

## 2024-03-15 DIAGNOSIS — D649 Anemia, unspecified: Secondary | ICD-10-CM

## 2024-03-15 DIAGNOSIS — I152 Hypertension secondary to endocrine disorders: Secondary | ICD-10-CM | POA: Diagnosis not present

## 2024-03-15 LAB — CMP (CANCER CENTER ONLY)
ALT: 21 U/L (ref 0–44)
AST: 34 U/L (ref 15–41)
Albumin: 3 g/dL — ABNORMAL LOW (ref 3.5–5.0)
Alkaline Phosphatase: 283 U/L — ABNORMAL HIGH (ref 38–126)
Anion gap: 5 (ref 5–15)
BUN: 24 mg/dL — ABNORMAL HIGH (ref 8–23)
CO2: 26 mmol/L (ref 22–32)
Calcium: 8.2 mg/dL — ABNORMAL LOW (ref 8.9–10.3)
Chloride: 103 mmol/L (ref 98–111)
Creatinine: 1.6 mg/dL — ABNORMAL HIGH (ref 0.61–1.24)
GFR, Estimated: 46 mL/min — ABNORMAL LOW (ref >60)
Glucose, Bld: 67 mg/dL — ABNORMAL LOW (ref 70–99)
Potassium: 4.5 mmol/L (ref 3.5–5.1)
Sodium: 134 mmol/L — ABNORMAL LOW (ref 135–145)
Total Bilirubin: 0.6 mg/dL (ref 0.0–1.2)
Total Protein: 7.6 g/dL (ref 6.5–8.1)

## 2024-03-15 LAB — SAMPLE TO BLOOD BANK

## 2024-03-15 LAB — CBC WITH DIFFERENTIAL (CANCER CENTER ONLY)
Abs Immature Granulocytes: 0.15 K/uL — ABNORMAL HIGH (ref 0.00–0.07)
Basophils Absolute: 0.1 K/uL (ref 0.0–0.1)
Basophils Relative: 1 %
Eosinophils Absolute: 0 K/uL (ref 0.0–0.5)
Eosinophils Relative: 0 %
HCT: 22.4 % — ABNORMAL LOW (ref 39.0–52.0)
Hemoglobin: 7.8 g/dL — ABNORMAL LOW (ref 13.0–17.0)
Immature Granulocytes: 2 %
Lymphocytes Relative: 12 %
Lymphs Abs: 1.2 K/uL (ref 0.7–4.0)
MCH: 31.3 pg (ref 26.0–34.0)
MCHC: 34.8 g/dL (ref 30.0–36.0)
MCV: 90 fL (ref 80.0–100.0)
Monocytes Absolute: 0.8 K/uL (ref 0.1–1.0)
Monocytes Relative: 8 %
Neutro Abs: 7.9 K/uL — ABNORMAL HIGH (ref 1.7–7.7)
Neutrophils Relative %: 77 %
Platelet Count: 86 K/uL — ABNORMAL LOW (ref 150–400)
RBC: 2.49 MIL/uL — ABNORMAL LOW (ref 4.22–5.81)
RDW: 19.5 % — ABNORMAL HIGH (ref 11.5–15.5)
WBC Count: 10.2 K/uL (ref 4.0–10.5)
nRBC: 0 % (ref 0.0–0.2)

## 2024-03-15 NOTE — Progress Notes (Signed)
 8 AM appointment for 9/13 confirmed with patient for 1 unit of blood. Advised to arrive 10 minutes early. He was instructed to keep blue bracelet on. Orders entered and T&S and prepare released.

## 2024-03-16 ENCOUNTER — Other Ambulatory Visit: Payer: Self-pay | Admitting: Physician Assistant

## 2024-03-16 DIAGNOSIS — D649 Anemia, unspecified: Secondary | ICD-10-CM

## 2024-03-16 LAB — PREPARE RBC (CROSSMATCH)

## 2024-03-17 ENCOUNTER — Inpatient Hospital Stay

## 2024-03-17 ENCOUNTER — Telehealth: Payer: Self-pay | Admitting: *Deleted

## 2024-03-17 NOTE — Telephone Encounter (Signed)
 Per BB- no blood available due to antibodies/antigens.  Called patient and explained situation.  Stated understanding and thankful for call.

## 2024-03-19 ENCOUNTER — Other Ambulatory Visit: Payer: Self-pay | Admitting: Physician Assistant

## 2024-03-19 ENCOUNTER — Telehealth: Payer: Self-pay

## 2024-03-19 ENCOUNTER — Other Ambulatory Visit: Payer: Self-pay

## 2024-03-19 DIAGNOSIS — D649 Anemia, unspecified: Secondary | ICD-10-CM

## 2024-03-19 NOTE — Telephone Encounter (Signed)
 Spoke with patient's daughter regarding upcoming appointments. Patient was unable to receive blood products on Saturday due to antibodies. Patient scheduled for port flush with labs tomorrow at 7:30 AM and blood products on Friday, 03/23/24, at 9:00 AM. Reminded daughter to ensure patient keeps blue arm band on for Friday's appointment. Daughter voiced understanding.

## 2024-03-20 ENCOUNTER — Ambulatory Visit

## 2024-03-20 DIAGNOSIS — D649 Anemia, unspecified: Secondary | ICD-10-CM

## 2024-03-20 DIAGNOSIS — Z5112 Encounter for antineoplastic immunotherapy: Secondary | ICD-10-CM | POA: Diagnosis not present

## 2024-03-20 DIAGNOSIS — E1159 Type 2 diabetes mellitus with other circulatory complications: Secondary | ICD-10-CM | POA: Diagnosis not present

## 2024-03-20 DIAGNOSIS — C3431 Malignant neoplasm of lower lobe, right bronchus or lung: Secondary | ICD-10-CM | POA: Diagnosis not present

## 2024-03-20 DIAGNOSIS — I152 Hypertension secondary to endocrine disorders: Secondary | ICD-10-CM | POA: Diagnosis not present

## 2024-03-20 DIAGNOSIS — C3412 Malignant neoplasm of upper lobe, left bronchus or lung: Secondary | ICD-10-CM

## 2024-03-20 DIAGNOSIS — J439 Emphysema, unspecified: Secondary | ICD-10-CM | POA: Diagnosis not present

## 2024-03-20 DIAGNOSIS — F419 Anxiety disorder, unspecified: Secondary | ICD-10-CM | POA: Diagnosis not present

## 2024-03-20 DIAGNOSIS — E440 Moderate protein-calorie malnutrition: Secondary | ICD-10-CM | POA: Diagnosis not present

## 2024-03-20 DIAGNOSIS — J45909 Unspecified asthma, uncomplicated: Secondary | ICD-10-CM | POA: Diagnosis not present

## 2024-03-20 DIAGNOSIS — C3492 Malignant neoplasm of unspecified part of left bronchus or lung: Secondary | ICD-10-CM | POA: Diagnosis not present

## 2024-03-20 DIAGNOSIS — Z5111 Encounter for antineoplastic chemotherapy: Secondary | ICD-10-CM | POA: Diagnosis not present

## 2024-03-20 DIAGNOSIS — C3491 Malignant neoplasm of unspecified part of right bronchus or lung: Secondary | ICD-10-CM | POA: Diagnosis not present

## 2024-03-20 DIAGNOSIS — Z5189 Encounter for other specified aftercare: Secondary | ICD-10-CM | POA: Diagnosis not present

## 2024-03-20 DIAGNOSIS — Z87891 Personal history of nicotine dependence: Secondary | ICD-10-CM | POA: Diagnosis not present

## 2024-03-20 DIAGNOSIS — C7951 Secondary malignant neoplasm of bone: Secondary | ICD-10-CM | POA: Diagnosis not present

## 2024-03-20 DIAGNOSIS — Z79633 Long term (current) use of mitotic inhibitor: Secondary | ICD-10-CM | POA: Diagnosis not present

## 2024-03-20 DIAGNOSIS — Z79620 Long term (current) use of immunosuppressive biologic: Secondary | ICD-10-CM | POA: Diagnosis not present

## 2024-03-20 LAB — CBC WITH DIFFERENTIAL (CANCER CENTER ONLY)
Abs Immature Granulocytes: 0.05 K/uL (ref 0.00–0.07)
Basophils Absolute: 0 K/uL (ref 0.0–0.1)
Basophils Relative: 1 %
Eosinophils Absolute: 0 K/uL (ref 0.0–0.5)
Eosinophils Relative: 0 %
HCT: 22.2 % — ABNORMAL LOW (ref 39.0–52.0)
Hemoglobin: 7.8 g/dL — ABNORMAL LOW (ref 13.0–17.0)
Immature Granulocytes: 1 %
Lymphocytes Relative: 14 %
Lymphs Abs: 0.8 K/uL (ref 0.7–4.0)
MCH: 32.6 pg (ref 26.0–34.0)
MCHC: 35.1 g/dL (ref 30.0–36.0)
MCV: 92.9 fL (ref 80.0–100.0)
Monocytes Absolute: 0.4 K/uL (ref 0.1–1.0)
Monocytes Relative: 7 %
Neutro Abs: 4.4 K/uL (ref 1.7–7.7)
Neutrophils Relative %: 77 %
Platelet Count: 85 K/uL — ABNORMAL LOW (ref 150–400)
RBC: 2.39 MIL/uL — ABNORMAL LOW (ref 4.22–5.81)
RDW: 20.3 % — ABNORMAL HIGH (ref 11.5–15.5)
WBC Count: 5.7 K/uL (ref 4.0–10.5)
nRBC: 0 % (ref 0.0–0.2)

## 2024-03-20 LAB — TSH: TSH: 5.08 u[IU]/mL — ABNORMAL HIGH (ref 0.350–4.500)

## 2024-03-20 LAB — CMP (CANCER CENTER ONLY)
ALT: 14 U/L (ref 0–44)
AST: 25 U/L (ref 15–41)
Albumin: 2.9 g/dL — ABNORMAL LOW (ref 3.5–5.0)
Alkaline Phosphatase: 251 U/L — ABNORMAL HIGH (ref 38–126)
Anion gap: 6 (ref 5–15)
BUN: 26 mg/dL — ABNORMAL HIGH (ref 8–23)
CO2: 23 mmol/L (ref 22–32)
Calcium: 8.7 mg/dL — ABNORMAL LOW (ref 8.9–10.3)
Chloride: 102 mmol/L (ref 98–111)
Creatinine: 1.89 mg/dL — ABNORMAL HIGH (ref 0.61–1.24)
GFR, Estimated: 38 mL/min — ABNORMAL LOW (ref >60)
Glucose, Bld: 125 mg/dL — ABNORMAL HIGH (ref 70–99)
Potassium: 4.3 mmol/L (ref 3.5–5.1)
Sodium: 131 mmol/L — ABNORMAL LOW (ref 135–145)
Total Bilirubin: 0.5 mg/dL (ref 0.0–1.2)
Total Protein: 7.6 g/dL (ref 6.5–8.1)

## 2024-03-20 LAB — TYPE AND SCREEN
ABO/RH(D): B POS
Antibody Screen: POSITIVE
DAT, IgG: POSITIVE

## 2024-03-20 LAB — SAMPLE TO BLOOD BANK
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
## 2024-03-22 ENCOUNTER — Other Ambulatory Visit

## 2024-03-22 ENCOUNTER — Inpatient Hospital Stay

## 2024-03-22 ENCOUNTER — Other Ambulatory Visit: Payer: Self-pay | Admitting: Physician Assistant

## 2024-03-22 ENCOUNTER — Inpatient Hospital Stay: Admitting: Physician Assistant

## 2024-03-22 VITALS — BP 105/77 | HR 128 | Temp 98.4°F | Resp 16 | Wt 179.7 lb

## 2024-03-22 DIAGNOSIS — D638 Anemia in other chronic diseases classified elsewhere: Secondary | ICD-10-CM | POA: Insufficient documentation

## 2024-03-22 DIAGNOSIS — Z79633 Long term (current) use of mitotic inhibitor: Secondary | ICD-10-CM | POA: Diagnosis not present

## 2024-03-22 DIAGNOSIS — C7951 Secondary malignant neoplasm of bone: Secondary | ICD-10-CM | POA: Diagnosis not present

## 2024-03-22 DIAGNOSIS — C3412 Malignant neoplasm of upper lobe, left bronchus or lung: Secondary | ICD-10-CM | POA: Diagnosis not present

## 2024-03-22 DIAGNOSIS — Z5189 Encounter for other specified aftercare: Secondary | ICD-10-CM | POA: Diagnosis not present

## 2024-03-22 DIAGNOSIS — D649 Anemia, unspecified: Secondary | ICD-10-CM | POA: Insufficient documentation

## 2024-03-22 DIAGNOSIS — R000 Tachycardia, unspecified: Secondary | ICD-10-CM | POA: Diagnosis not present

## 2024-03-22 DIAGNOSIS — C3431 Malignant neoplasm of lower lobe, right bronchus or lung: Secondary | ICD-10-CM | POA: Diagnosis not present

## 2024-03-22 DIAGNOSIS — Z87891 Personal history of nicotine dependence: Secondary | ICD-10-CM | POA: Diagnosis not present

## 2024-03-22 DIAGNOSIS — Z5111 Encounter for antineoplastic chemotherapy: Secondary | ICD-10-CM | POA: Diagnosis not present

## 2024-03-22 DIAGNOSIS — Z79620 Long term (current) use of immunosuppressive biologic: Secondary | ICD-10-CM | POA: Diagnosis not present

## 2024-03-22 DIAGNOSIS — Z5112 Encounter for antineoplastic immunotherapy: Secondary | ICD-10-CM | POA: Diagnosis not present

## 2024-03-22 LAB — PREPARE RBC (CROSSMATCH)

## 2024-03-23 ENCOUNTER — Ambulatory Visit

## 2024-03-23 VITALS — BP 117/83 | HR 98 | Temp 98.1°F | Resp 16 | Wt 181.1 lb

## 2024-03-23 DIAGNOSIS — D5911 Warm autoimmune hemolytic anemia: Secondary | ICD-10-CM | POA: Insufficient documentation

## 2024-03-23 DIAGNOSIS — Z5112 Encounter for antineoplastic immunotherapy: Secondary | ICD-10-CM | POA: Diagnosis not present

## 2024-03-23 DIAGNOSIS — D649 Anemia, unspecified: Secondary | ICD-10-CM

## 2024-03-23 DIAGNOSIS — D5912 Cold autoimmune hemolytic anemia: Secondary | ICD-10-CM | POA: Insufficient documentation

## 2024-03-23 MED ORDER — ACETAMINOPHEN 325 MG PO TABS
650.0000 mg | ORAL_TABLET | Freq: Once | ORAL | Status: AC
  Administered 2024-03-23: 650 mg via ORAL
  Filled 2024-03-23: qty 2

## 2024-03-23 MED ORDER — DIPHENHYDRAMINE HCL 25 MG PO CAPS
25.0000 mg | ORAL_CAPSULE | Freq: Once | ORAL | Status: AC
  Administered 2024-03-23: 25 mg via ORAL
  Filled 2024-03-23: qty 1

## 2024-03-23 MED ORDER — ACETAMINOPHEN 325 MG PO TABS
650.0000 mg | ORAL_TABLET | Freq: Four times a day (QID) | ORAL | Status: DC | PRN
  Administered 2024-03-23: 650 mg via ORAL
  Filled 2024-03-23: qty 2

## 2024-03-23 MED ORDER — DIPHENHYDRAMINE HCL 25 MG PO CAPS
25.0000 mg | ORAL_CAPSULE | Freq: Four times a day (QID) | ORAL | Status: DC | PRN
  Administered 2024-03-23: 25 mg via ORAL
  Filled 2024-03-23: qty 1

## 2024-03-23 MED ORDER — SODIUM CHLORIDE 0.9% IV SOLUTION
250.0000 mL | INTRAVENOUS | Status: DC
  Administered 2024-03-23: 100 mL via INTRAVENOUS

## 2024-03-23 NOTE — Progress Notes (Signed)
 D/t delay in starting blood transfusion d/t blood warmer, per Mallie PA-C Pt to be re-premedicated for blood transfusion with Tylenol  & Benadryl  and OK to not wait 30 minutes before starting blood transfusion today.

## 2024-03-23 NOTE — Patient Instructions (Signed)

## 2024-03-24 ENCOUNTER — Inpatient Hospital Stay

## 2024-03-26 LAB — TYPE AND SCREEN
ABO/RH(D): B POS
Antibody Screen: POSITIVE
Unit division: 0
Unit division: 0

## 2024-03-26 LAB — BPAM RBC
Blood Product Expiration Date: 202510032359
Blood Product Expiration Date: 202510122359
ISSUE DATE / TIME: 202509190930
ISSUE DATE / TIME: 202509191446
Unit Type and Rh: 7300
Unit Type and Rh: 7300

## 2024-03-27 ENCOUNTER — Telehealth: Payer: Self-pay

## 2024-03-27 MED ORDER — METOPROLOL SUCCINATE ER 25 MG PO TB24: 12.5000 mg | ORAL_TABLET | Freq: Every day | ORAL | 1 refills | Status: DC

## 2024-03-27 NOTE — Addendum Note (Signed)
 Addended by: Caci Orren on: 03/27/2024 04:58 PM   Modules accepted: Orders

## 2024-03-27 NOTE — Telephone Encounter (Signed)
 Can you please notify the patient's daughter that I would like him to discontinue amlodipine  and I have placed him on metoprolol  12.5 mg (half of the 25 mg) for blood pressure instead which should help with his heart rate as I do see his heart rate has also been elevated at his oncology visit.

## 2024-03-27 NOTE — Telephone Encounter (Signed)
 FYI

## 2024-03-27 NOTE — Telephone Encounter (Signed)
 Copied from CRM [id number]. Topic: Clinical - Home Health Verbal Orders >> Mar 27, 2024 11:03 AM Leonette SQUIBB wrote: Caller/Agency: [facility]  Callback Number: [phone]  Service Requested: Physical Therapy  Missed it today because  resting HA  120 and 125.    Any new concerns about the patient? Yes  heart rate

## 2024-03-28 NOTE — Telephone Encounter (Signed)
Daughter has been informed.

## 2024-03-28 NOTE — Telephone Encounter (Signed)
 LVM for return call.

## 2024-03-29 ENCOUNTER — Inpatient Hospital Stay: Admitting: Internal Medicine

## 2024-03-29 ENCOUNTER — Inpatient Hospital Stay

## 2024-03-29 ENCOUNTER — Ambulatory Visit: Admitting: Podiatry

## 2024-03-29 VITALS — BP 123/88 | HR 126 | Temp 98.0°F | Resp 17 | Ht 74.0 in | Wt 182.0 lb

## 2024-03-29 DIAGNOSIS — B351 Tinea unguium: Secondary | ICD-10-CM | POA: Diagnosis not present

## 2024-03-29 DIAGNOSIS — C3412 Malignant neoplasm of upper lobe, left bronchus or lung: Secondary | ICD-10-CM

## 2024-03-29 DIAGNOSIS — C7951 Secondary malignant neoplasm of bone: Secondary | ICD-10-CM | POA: Diagnosis not present

## 2024-03-29 DIAGNOSIS — Z79633 Long term (current) use of mitotic inhibitor: Secondary | ICD-10-CM | POA: Diagnosis not present

## 2024-03-29 DIAGNOSIS — M25572 Pain in left ankle and joints of left foot: Secondary | ICD-10-CM

## 2024-03-29 DIAGNOSIS — Z5189 Encounter for other specified aftercare: Secondary | ICD-10-CM | POA: Diagnosis not present

## 2024-03-29 DIAGNOSIS — M79671 Pain in right foot: Secondary | ICD-10-CM

## 2024-03-29 DIAGNOSIS — M79672 Pain in left foot: Secondary | ICD-10-CM | POA: Diagnosis not present

## 2024-03-29 DIAGNOSIS — Z5112 Encounter for antineoplastic immunotherapy: Secondary | ICD-10-CM | POA: Diagnosis not present

## 2024-03-29 DIAGNOSIS — C3491 Malignant neoplasm of unspecified part of right bronchus or lung: Secondary | ICD-10-CM | POA: Diagnosis not present

## 2024-03-29 DIAGNOSIS — C3431 Malignant neoplasm of lower lobe, right bronchus or lung: Secondary | ICD-10-CM | POA: Diagnosis not present

## 2024-03-29 DIAGNOSIS — Z87891 Personal history of nicotine dependence: Secondary | ICD-10-CM | POA: Diagnosis not present

## 2024-03-29 DIAGNOSIS — Z79620 Long term (current) use of immunosuppressive biologic: Secondary | ICD-10-CM | POA: Diagnosis not present

## 2024-03-29 DIAGNOSIS — M25571 Pain in right ankle and joints of right foot: Secondary | ICD-10-CM | POA: Diagnosis not present

## 2024-03-29 DIAGNOSIS — Z5111 Encounter for antineoplastic chemotherapy: Secondary | ICD-10-CM | POA: Diagnosis not present

## 2024-03-29 LAB — CBC WITH DIFFERENTIAL (CANCER CENTER ONLY)
Abs Immature Granulocytes: 0.02 K/uL (ref 0.00–0.07)
Basophils Absolute: 0 K/uL (ref 0.0–0.1)
Basophils Relative: 1 %
Eosinophils Absolute: 0.1 K/uL (ref 0.0–0.5)
Eosinophils Relative: 2 %
HCT: 24.1 % — ABNORMAL LOW (ref 39.0–52.0)
Hemoglobin: 8.4 g/dL — ABNORMAL LOW (ref 13.0–17.0)
Immature Granulocytes: 1 %
Lymphocytes Relative: 22 %
Lymphs Abs: 0.7 K/uL (ref 0.7–4.0)
MCH: 32.4 pg (ref 26.0–34.0)
MCHC: 34.9 g/dL (ref 30.0–36.0)
MCV: 93.1 fL (ref 80.0–100.0)
Monocytes Absolute: 0.4 K/uL (ref 0.1–1.0)
Monocytes Relative: 12 %
Neutro Abs: 1.9 K/uL (ref 1.7–7.7)
Neutrophils Relative %: 62 %
Platelet Count: 107 K/uL — ABNORMAL LOW (ref 150–400)
RBC: 2.59 MIL/uL — ABNORMAL LOW (ref 4.22–5.81)
RDW: 22.9 % — ABNORMAL HIGH (ref 11.5–15.5)
WBC Count: 3.1 K/uL — ABNORMAL LOW (ref 4.0–10.5)
nRBC: 0 % (ref 0.0–0.2)

## 2024-03-29 LAB — CMP (CANCER CENTER ONLY)
ALT: 12 U/L (ref 0–44)
AST: 23 U/L (ref 15–41)
Albumin: 3.1 g/dL — ABNORMAL LOW (ref 3.5–5.0)
Alkaline Phosphatase: 205 U/L — ABNORMAL HIGH (ref 38–126)
Anion gap: 7 (ref 5–15)
BUN: 23 mg/dL (ref 8–23)
CO2: 24 mmol/L (ref 22–32)
Calcium: 8.9 mg/dL (ref 8.9–10.3)
Chloride: 100 mmol/L (ref 98–111)
Creatinine: 1.59 mg/dL — ABNORMAL HIGH (ref 0.61–1.24)
GFR, Estimated: 47 mL/min — ABNORMAL LOW (ref >60)
Glucose, Bld: 86 mg/dL (ref 70–99)
Potassium: 4 mmol/L (ref 3.5–5.1)
Sodium: 131 mmol/L — ABNORMAL LOW (ref 135–145)
Total Bilirubin: 0.5 mg/dL (ref 0.0–1.2)
Total Protein: 7.9 g/dL (ref 6.5–8.1)

## 2024-03-29 LAB — TSH: TSH: 5 u[IU]/mL — ABNORMAL HIGH (ref 0.350–4.500)

## 2024-03-29 LAB — SAMPLE TO BLOOD BANK
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
## 2024-03-29 NOTE — Progress Notes (Signed)
 Patient presents for evaluation and treatment of tenderness and some redness around nails feet.  Tenderness around toes with walking and wearing shoes.  Physical exam:  General appearance: Alert, pleasant, and in no acute distress.  Vascular: Pedal pulses: DP 2/4 B/L, PT 1/4 B/L.  Mild edema lower legs bilaterally  Neurologic: Light touch intact bilaterally.  Slightly diminished vibratory sensation bilaterally.  Achilles tendon reflex normal  Dermatologic:  Nails thickened, disfigured, discolored 1-5 BL with subungual debris.  Redness and hypertrophic nail folds along nail folds bilaterally but no signs of drainage or infection.  Musculoskeletal:  Hallux valgus bilaterally.  Hallux limitus bilaterally with limitation range of motion in dorsiflexion.  Significant osteophytic changes dorsally at first MTP bilaterally.  Normal muscle strength lower extremity bilaterally   Diagnosis: 1. Painful onychomycotic nails 1 through 5 bilaterally. 2. Pain toes 1 through 5 bilaterally. 3.  Arthralgia first MTP bilaterally  Plan: -New patient office visit for evaluation and management level 3.  Modifier 25. - Discussed with him the hallux valgus and hallux limitus deformities and the arthralgia in the first MTP.  Recommend wearing good stable supportive shoes with a wide toebox.  Recommended Voltaren gel topically for inflammation. -Recommend periodic debridement of the nails on a 3-month basis. -Debrided onychomycotic nails 1 through 5 bilaterally.  Sharply debrided nails with nail clipper and reduced with a power bur.  Return 3 months [facility]

## 2024-03-30 ENCOUNTER — Inpatient Hospital Stay

## 2024-03-30 VITALS — BP 115/84 | HR 99 | Temp 97.5°F | Resp 16

## 2024-03-30 DIAGNOSIS — Z5112 Encounter for antineoplastic immunotherapy: Secondary | ICD-10-CM | POA: Diagnosis not present

## 2024-03-30 DIAGNOSIS — C3412 Malignant neoplasm of upper lobe, left bronchus or lung: Secondary | ICD-10-CM

## 2024-03-30 DIAGNOSIS — Z79620 Long term (current) use of immunosuppressive biologic: Secondary | ICD-10-CM | POA: Diagnosis not present

## 2024-03-30 DIAGNOSIS — C3431 Malignant neoplasm of lower lobe, right bronchus or lung: Secondary | ICD-10-CM | POA: Diagnosis not present

## 2024-03-30 DIAGNOSIS — Z5111 Encounter for antineoplastic chemotherapy: Secondary | ICD-10-CM | POA: Diagnosis not present

## 2024-03-30 DIAGNOSIS — C7951 Secondary malignant neoplasm of bone: Secondary | ICD-10-CM | POA: Diagnosis not present

## 2024-03-30 DIAGNOSIS — Z87891 Personal history of nicotine dependence: Secondary | ICD-10-CM | POA: Diagnosis not present

## 2024-03-30 DIAGNOSIS — Z79633 Long term (current) use of mitotic inhibitor: Secondary | ICD-10-CM | POA: Diagnosis not present

## 2024-03-30 LAB — T4: T4, Total: 7.4 ug/dL (ref 4.5–12.0)

## 2024-03-30 MED ORDER — SODIUM CHLORIDE 0.9 % IV SOLN
380.0000 mg | Freq: Once | INTRAVENOUS | Status: AC
  Administered 2024-03-30: 380 mg via INTRAVENOUS
  Filled 2024-03-30: qty 38

## 2024-03-30 MED ORDER — DEXAMETHASONE SODIUM PHOSPHATE 10 MG/ML IJ SOLN
10.0000 mg | Freq: Once | INTRAMUSCULAR | Status: AC
  Administered 2024-03-30: 10 mg via INTRAVENOUS
  Filled 2024-03-30: qty 1

## 2024-03-30 MED ORDER — FAMOTIDINE IN NACL 20-0.9 MG/50ML-% IV SOLN
20.0000 mg | Freq: Once | INTRAVENOUS | Status: AC
  Administered 2024-03-30: 20 mg via INTRAVENOUS
  Filled 2024-03-30: qty 50

## 2024-03-30 MED ORDER — SODIUM CHLORIDE 0.9 % IV SOLN
350.0000 mg | Freq: Once | INTRAVENOUS | Status: AC
  Administered 2024-03-30: 350 mg via INTRAVENOUS
  Filled 2024-03-30: qty 7

## 2024-03-30 MED ORDER — PALONOSETRON HCL INJECTION 0.25 MG/5ML
0.2500 mg | Freq: Once | INTRAVENOUS | Status: AC
  Administered 2024-03-30: 0.25 mg via INTRAVENOUS
  Filled 2024-03-30: qty 5

## 2024-03-30 MED ORDER — APREPITANT 130 MG/18ML IV EMUL
130.0000 mg | Freq: Once | INTRAVENOUS | Status: AC
  Administered 2024-03-30: 130 mg via INTRAVENOUS
  Filled 2024-03-30: qty 18

## 2024-03-30 MED ORDER — SODIUM CHLORIDE 0.9 % IV SOLN
150.0000 mg/m2 | Freq: Once | INTRAVENOUS | Status: AC
  Administered 2024-03-30: 318 mg via INTRAVENOUS
  Filled 2024-03-30: qty 53

## 2024-03-30 MED ORDER — CETIRIZINE HCL 10 MG/ML IV SOLN
10.0000 mg | Freq: Once | INTRAVENOUS | Status: AC
  Administered 2024-03-30: 10 mg via INTRAVENOUS
  Filled 2024-03-30: qty 1

## 2024-03-30 MED ORDER — SODIUM CHLORIDE 0.9 % IV SOLN: INTRAVENOUS | Status: DC

## 2024-03-30 NOTE — Patient Instructions (Signed)
 [facility] - A DEPT OF [place].  [facility]ITAL  Discharge Instructions: Thank you for choosing [facility] to provide your oncology and hematology care.   If you have a lab appointment with the Cancer Center, please go directly to the Cancer Center and check in at the registration area.   Wear comfortable clothing and clothing appropriate for easy access to any Portacath or PICC line.   We strive to give you quality time with your provider. You may need to reschedule your appointment if you arrive late (15 or more minutes).  Arriving late affects you and other patients whose appointments are after yours.  Also, if you miss three or more appointments without notifying the office, you may be dismissed from the clinic at the provider's discretion.      For prescription refill requests, have your pharmacy contact our office and allow 72 hours for refills to be completed.    Today you received the following chemotherapy and/or immunotherapy agents: Taxol , Carboplatin , Libtayo       To help prevent nausea and vomiting after your treatment, we encourage you to take your nausea medication as directed.  BELOW ARE SYMPTOMS THAT SHOULD BE REPORTED IMMEDIATELY: *FEVER GREATER THAN 100.4 F (38 C) OR HIGHER *CHILLS OR SWEATING *NAUSEA AND VOMITING THAT IS NOT CONTROLLED WITH YOUR NAUSEA MEDICATION *UNUSUAL SHORTNESS OF BREATH *UNUSUAL BRUISING OR BLEEDING *URINARY PROBLEMS (pain or burning when urinating, or frequent urination) *BOWEL PROBLEMS (unusual diarrhea, constipation, pain near the anus) TENDERNESS IN MOUTH AND THROAT WITH OR WITHOUT PRESENCE OF ULCERS (sore throat, sores in mouth, or a toothache) UNUSUAL RASH, SWELLING OR PAIN  UNUSUAL VAGINAL DISCHARGE OR ITCHING   Items with * indicate a potential emergency and should be followed up as soon as possible or go to the Emergency Department if any problems should occur.  Please show the CHEMOTHERAPY ALERT CARD  or IMMUNOTHERAPY ALERT CARD at check-in to the Emergency Department and triage nurse.  Should you have questions after your visit or need to cancel or reschedule your appointment, please contact [facility] - A DEPT OF [facility]  Dept: [phone]  and follow the prompts.  Office hours are 8:00 a.m. to 4:30 p.m. Monday - Friday. Please note that voicemails left after 4:00 p.m. may not be returned until the following business day.  We are closed weekends and major holidays. You have access to a nurse at all times for urgent questions. Please call the main number to the clinic Dept: [phone] and follow the prompts.   For any non-urgent questions, you may also contact your provider using MyChart. We now offer e-Visits for anyone 56 and older to request care online for non-urgent symptoms. For details visit [email or web address].   Also download the MyChart app! Go to the app store, search MyChart, open the app, select [place], and log in with your MyChart username and password.

## 2024-03-30 NOTE — Progress Notes (Signed)
 Per Cassie Heilingoetter, PA ok to continue with Carboplatin  380 mg.  Alfonso MARLA Buys, PharmD Pharmacy Resident  03/30/2024 10:47 AM

## 2024-04-02 ENCOUNTER — Inpatient Hospital Stay

## 2024-04-02 VITALS — BP 107/75 | HR 108 | Temp 98.0°F | Resp 18

## 2024-04-02 DIAGNOSIS — Z5111 Encounter for antineoplastic chemotherapy: Secondary | ICD-10-CM | POA: Diagnosis not present

## 2024-04-02 DIAGNOSIS — Z5112 Encounter for antineoplastic immunotherapy: Secondary | ICD-10-CM | POA: Diagnosis not present

## 2024-04-02 DIAGNOSIS — C3412 Malignant neoplasm of upper lobe, left bronchus or lung: Secondary | ICD-10-CM

## 2024-04-02 MED ORDER — PEGFILGRASTIM-CBQV 6 MG/0.6ML ~~LOC~~ SOSY
6.0000 mg | PREFILLED_SYRINGE | Freq: Once | SUBCUTANEOUS | Status: AC
  Administered 2024-04-02: 6 mg via SUBCUTANEOUS
  Filled 2024-04-02: qty 0.6

## 2024-04-05 NOTE — Telephone Encounter (Signed)
 Copied from CRM #[id number]. Topic: General - Other >> Apr 05, 2024 10:16 AM Olam RAMAN wrote:  Reason for CRM: [facility], Pt has resting heart rate 128 and denies distress and PT can not do anything with PT heart rate over 120 unless dr gives altenate. Rolater for PT if not promaiters for care please call home health.  [phone]

## 2024-04-06 DIAGNOSIS — I10 Essential (primary) hypertension: Secondary | ICD-10-CM | POA: Diagnosis not present

## 2024-04-06 DIAGNOSIS — Z961 Presence of intraocular lens: Secondary | ICD-10-CM | POA: Diagnosis not present

## 2024-04-06 DIAGNOSIS — M62838 Other muscle spasm: Secondary | ICD-10-CM | POA: Diagnosis not present

## 2024-04-06 DIAGNOSIS — G629 Polyneuropathy, unspecified: Secondary | ICD-10-CM | POA: Diagnosis not present

## 2024-04-06 DIAGNOSIS — J449 Chronic obstructive pulmonary disease, unspecified: Secondary | ICD-10-CM | POA: Diagnosis not present

## 2024-04-06 DIAGNOSIS — Z833 Family history of diabetes mellitus: Secondary | ICD-10-CM | POA: Diagnosis not present

## 2024-04-06 DIAGNOSIS — J302 Other seasonal allergic rhinitis: Secondary | ICD-10-CM | POA: Diagnosis not present

## 2024-04-06 DIAGNOSIS — K219 Gastro-esophageal reflux disease without esophagitis: Secondary | ICD-10-CM | POA: Diagnosis not present

## 2024-04-06 DIAGNOSIS — Z8249 Family history of ischemic heart disease and other diseases of the circulatory system: Secondary | ICD-10-CM | POA: Diagnosis not present

## 2024-04-06 DIAGNOSIS — D84822 Immunodeficiency due to external causes: Secondary | ICD-10-CM | POA: Diagnosis not present

## 2024-04-06 DIAGNOSIS — Z720 Tobacco use: Secondary | ICD-10-CM | POA: Diagnosis not present

## 2024-04-06 DIAGNOSIS — Z85118 Personal history of other malignant neoplasm of bronchus and lung: Secondary | ICD-10-CM | POA: Diagnosis not present

## 2024-04-06 DIAGNOSIS — Y842 Radiological procedure and radiotherapy as the cause of abnormal reaction of the patient, or of later complication, without mention of misadventure at the time of the procedure: Secondary | ICD-10-CM | POA: Diagnosis not present

## 2024-04-06 DIAGNOSIS — M109 Gout, unspecified: Secondary | ICD-10-CM | POA: Diagnosis not present

## 2024-04-06 DIAGNOSIS — E785 Hyperlipidemia, unspecified: Secondary | ICD-10-CM | POA: Diagnosis not present

## 2024-04-06 DIAGNOSIS — Z85830 Personal history of malignant neoplasm of bone: Secondary | ICD-10-CM | POA: Diagnosis not present

## 2024-04-06 NOTE — Telephone Encounter (Signed)
LVM for return phone call.

## 2024-04-09 ENCOUNTER — Telehealth: Payer: Self-pay

## 2024-04-09 ENCOUNTER — Encounter: Payer: Self-pay | Admitting: Internal Medicine

## 2024-04-09 DIAGNOSIS — C7951 Secondary malignant neoplasm of bone: Secondary | ICD-10-CM | POA: Diagnosis not present

## 2024-04-09 DIAGNOSIS — E1159 Type 2 diabetes mellitus with other circulatory complications: Secondary | ICD-10-CM | POA: Diagnosis not present

## 2024-04-09 DIAGNOSIS — E440 Moderate protein-calorie malnutrition: Secondary | ICD-10-CM | POA: Diagnosis not present

## 2024-04-09 DIAGNOSIS — I152 Hypertension secondary to endocrine disorders: Secondary | ICD-10-CM | POA: Diagnosis not present

## 2024-04-09 DIAGNOSIS — J45909 Unspecified asthma, uncomplicated: Secondary | ICD-10-CM | POA: Diagnosis not present

## 2024-04-09 DIAGNOSIS — F419 Anxiety disorder, unspecified: Secondary | ICD-10-CM | POA: Diagnosis not present

## 2024-04-09 DIAGNOSIS — C3492 Malignant neoplasm of unspecified part of left bronchus or lung: Secondary | ICD-10-CM | POA: Diagnosis not present

## 2024-04-09 DIAGNOSIS — C3491 Malignant neoplasm of unspecified part of right bronchus or lung: Secondary | ICD-10-CM | POA: Diagnosis not present

## 2024-04-09 DIAGNOSIS — J439 Emphysema, unspecified: Secondary | ICD-10-CM | POA: Diagnosis not present

## 2024-04-09 NOTE — Telephone Encounter (Signed)
LVM for return phone call.

## 2024-04-09 NOTE — Telephone Encounter (Signed)
 Spoke with Ida from [facility] regarding the patient's medications. Ida called to inquire if it was okay for the patient to take fish oil, vitamin B1, and Centrum Silver multivitamin. Per Dr. Sherrod, the patient can take the medications listed above. Ida voiced understanding.

## 2024-04-11 DIAGNOSIS — C7951 Secondary malignant neoplasm of bone: Secondary | ICD-10-CM | POA: Diagnosis not present

## 2024-04-11 DIAGNOSIS — J439 Emphysema, unspecified: Secondary | ICD-10-CM | POA: Diagnosis not present

## 2024-04-11 DIAGNOSIS — C3491 Malignant neoplasm of unspecified part of right bronchus or lung: Secondary | ICD-10-CM | POA: Diagnosis not present

## 2024-04-11 DIAGNOSIS — E1159 Type 2 diabetes mellitus with other circulatory complications: Secondary | ICD-10-CM | POA: Diagnosis not present

## 2024-04-11 DIAGNOSIS — I152 Hypertension secondary to endocrine disorders: Secondary | ICD-10-CM | POA: Diagnosis not present

## 2024-04-11 DIAGNOSIS — J45909 Unspecified asthma, uncomplicated: Secondary | ICD-10-CM | POA: Diagnosis not present

## 2024-04-11 DIAGNOSIS — E440 Moderate protein-calorie malnutrition: Secondary | ICD-10-CM | POA: Diagnosis not present

## 2024-04-11 DIAGNOSIS — C3492 Malignant neoplasm of unspecified part of left bronchus or lung: Secondary | ICD-10-CM | POA: Diagnosis not present

## 2024-04-12 ENCOUNTER — Ambulatory Visit: Admitting: Internal Medicine

## 2024-04-12 ENCOUNTER — Telehealth: Payer: Self-pay | Admitting: Family Medicine

## 2024-04-12 ENCOUNTER — Other Ambulatory Visit

## 2024-04-12 ENCOUNTER — Ambulatory Visit

## 2024-04-12 NOTE — Telephone Encounter (Signed)
Verbal orders given for patient.

## 2024-04-12 NOTE — Telephone Encounter (Signed)
 Copied from CRM [id number]. Topic: Clinical - Home Health Verbal Orders >> Apr 12, 2024 12:06 PM Zebedee SAUNDERS wrote:  Caller/Agency: [facility] per Chyrl Rushing Number: [phone] secure line Service Requested: Physical Therapy Frequency: 1xweek for 8weeks, strengthen, balancing Any new concerns about the patient? No

## 2024-04-14 ENCOUNTER — Ambulatory Visit

## 2024-04-16 ENCOUNTER — Telehealth: Payer: Self-pay

## 2024-04-16 DIAGNOSIS — C3491 Malignant neoplasm of unspecified part of right bronchus or lung: Secondary | ICD-10-CM | POA: Diagnosis not present

## 2024-04-16 DIAGNOSIS — I152 Hypertension secondary to endocrine disorders: Secondary | ICD-10-CM | POA: Diagnosis not present

## 2024-04-16 DIAGNOSIS — J439 Emphysema, unspecified: Secondary | ICD-10-CM | POA: Diagnosis not present

## 2024-04-16 DIAGNOSIS — C7951 Secondary malignant neoplasm of bone: Secondary | ICD-10-CM | POA: Diagnosis not present

## 2024-04-16 DIAGNOSIS — F419 Anxiety disorder, unspecified: Secondary | ICD-10-CM | POA: Diagnosis not present

## 2024-04-16 DIAGNOSIS — J45909 Unspecified asthma, uncomplicated: Secondary | ICD-10-CM | POA: Diagnosis not present

## 2024-04-16 DIAGNOSIS — E1159 Type 2 diabetes mellitus with other circulatory complications: Secondary | ICD-10-CM | POA: Diagnosis not present

## 2024-04-16 DIAGNOSIS — C3492 Malignant neoplasm of unspecified part of left bronchus or lung: Secondary | ICD-10-CM | POA: Diagnosis not present

## 2024-04-16 DIAGNOSIS — E440 Moderate protein-calorie malnutrition: Secondary | ICD-10-CM | POA: Diagnosis not present

## 2024-04-16 NOTE — Telephone Encounter (Signed)
 Notified the pt daughter regarding he FMLA forms being completed,faxed, and confirmation received. Pt daughter copy was emailed upon request. No questions or concerns to be noted at this time.

## 2024-04-18 ENCOUNTER — Inpatient Hospital Stay: Admitting: Internal Medicine

## 2024-04-18 ENCOUNTER — Encounter: Payer: Self-pay | Admitting: Internal Medicine

## 2024-04-18 ENCOUNTER — Inpatient Hospital Stay: Attending: Physician Assistant

## 2024-04-18 ENCOUNTER — Inpatient Hospital Stay

## 2024-04-18 VITALS — BP 114/68 | HR 122 | Resp 17 | Ht 74.0 in | Wt 180.0 lb

## 2024-04-18 VITALS — HR 115 | Temp 97.9°F

## 2024-04-18 DIAGNOSIS — C3490 Malignant neoplasm of unspecified part of unspecified bronchus or lung: Secondary | ICD-10-CM

## 2024-04-18 DIAGNOSIS — C778 Secondary and unspecified malignant neoplasm of lymph nodes of multiple regions: Secondary | ICD-10-CM | POA: Diagnosis not present

## 2024-04-18 DIAGNOSIS — Z79633 Long term (current) use of mitotic inhibitor: Secondary | ICD-10-CM | POA: Diagnosis not present

## 2024-04-18 DIAGNOSIS — C3412 Malignant neoplasm of upper lobe, left bronchus or lung: Secondary | ICD-10-CM

## 2024-04-18 DIAGNOSIS — Z79630 Long term (current) use of alkylating agent: Secondary | ICD-10-CM | POA: Insufficient documentation

## 2024-04-18 DIAGNOSIS — C3431 Malignant neoplasm of lower lobe, right bronchus or lung: Secondary | ICD-10-CM | POA: Diagnosis not present

## 2024-04-18 DIAGNOSIS — C7951 Secondary malignant neoplasm of bone: Secondary | ICD-10-CM | POA: Insufficient documentation

## 2024-04-18 DIAGNOSIS — Z5189 Encounter for other specified aftercare: Secondary | ICD-10-CM | POA: Insufficient documentation

## 2024-04-18 DIAGNOSIS — Z5112 Encounter for antineoplastic immunotherapy: Secondary | ICD-10-CM | POA: Insufficient documentation

## 2024-04-18 DIAGNOSIS — Z5111 Encounter for antineoplastic chemotherapy: Secondary | ICD-10-CM | POA: Insufficient documentation

## 2024-04-18 LAB — CBC WITH DIFFERENTIAL (CANCER CENTER ONLY)
Abs Immature Granulocytes: 0.03 K/uL (ref 0.00–0.07)
Basophils Absolute: 0 K/uL (ref 0.0–0.1)
Basophils Relative: 1 %
Eosinophils Absolute: 0 K/uL (ref 0.0–0.5)
Eosinophils Relative: 0 %
HCT: 24.8 % — ABNORMAL LOW (ref 39.0–52.0)
Hemoglobin: 8.4 g/dL — ABNORMAL LOW (ref 13.0–17.0)
Immature Granulocytes: 1 %
Lymphocytes Relative: 23 %
Lymphs Abs: 0.9 K/uL (ref 0.7–4.0)
MCH: 32.4 pg (ref 26.0–34.0)
MCHC: 33.9 g/dL (ref 30.0–36.0)
MCV: 95.8 fL (ref 80.0–100.0)
Monocytes Absolute: 0.3 K/uL (ref 0.1–1.0)
Monocytes Relative: 8 %
Neutro Abs: 2.7 K/uL (ref 1.7–7.7)
Neutrophils Relative %: 67 %
Platelet Count: 84 K/uL — ABNORMAL LOW (ref 150–400)
RBC: 2.59 MIL/uL — ABNORMAL LOW (ref 4.22–5.81)
RDW: 22.9 % — ABNORMAL HIGH (ref 11.5–15.5)
WBC Count: 4 K/uL (ref 4.0–10.5)
nRBC: 0 % (ref 0.0–0.2)

## 2024-04-18 LAB — CMP (CANCER CENTER ONLY)
ALT: 13 U/L (ref 0–44)
AST: 23 U/L (ref 15–41)
Albumin: 3.2 g/dL — ABNORMAL LOW (ref 3.5–5.0)
Alkaline Phosphatase: 248 U/L — ABNORMAL HIGH (ref 38–126)
Anion gap: 6 (ref 5–15)
BUN: 18 mg/dL (ref 8–23)
CO2: 25 mmol/L (ref 22–32)
Calcium: 9.4 mg/dL (ref 8.9–10.3)
Chloride: 102 mmol/L (ref 98–111)
Creatinine: 1.59 mg/dL — ABNORMAL HIGH (ref 0.61–1.24)
GFR, Estimated: 47 mL/min — ABNORMAL LOW (ref >60)
Glucose, Bld: 83 mg/dL (ref 70–99)
Potassium: 4.3 mmol/L (ref 3.5–5.1)
Sodium: 133 mmol/L — ABNORMAL LOW (ref 135–145)
Total Bilirubin: 0.6 mg/dL (ref 0.0–1.2)
Total Protein: 7.9 g/dL (ref 6.5–8.1)

## 2024-04-18 LAB — SAMPLE TO BLOOD BANK

## 2024-04-18 MED ORDER — SODIUM CHLORIDE 0.9 % IV SOLN
150.0000 mg/m2 | Freq: Once | INTRAVENOUS | Status: AC
  Administered 2024-04-18: 318 mg via INTRAVENOUS
  Filled 2024-04-18: qty 53

## 2024-04-18 MED ORDER — SODIUM CHLORIDE 0.9% FLUSH: 10.0000 mL | INTRAVENOUS | Status: DC | PRN

## 2024-04-18 MED ORDER — PALONOSETRON HCL INJECTION 0.25 MG/5ML
0.2500 mg | Freq: Once | INTRAVENOUS | Status: AC
  Administered 2024-04-18: 0.25 mg via INTRAVENOUS
  Filled 2024-04-18: qty 5

## 2024-04-18 MED ORDER — SODIUM CHLORIDE 0.9 % IV SOLN
380.0000 mg | Freq: Once | INTRAVENOUS | Status: AC
  Administered 2024-04-18: 380 mg via INTRAVENOUS
  Filled 2024-04-18: qty 38

## 2024-04-18 MED ORDER — CETIRIZINE HCL 10 MG/ML IV SOLN
10.0000 mg | Freq: Once | INTRAVENOUS | Status: AC
  Administered 2024-04-18: 10 mg via INTRAVENOUS
  Filled 2024-04-18: qty 1

## 2024-04-18 MED ORDER — DEXAMETHASONE SOD PHOSPHATE PF 10 MG/ML IJ SOLN
10.0000 mg | Freq: Once | INTRAMUSCULAR | Status: AC
  Administered 2024-04-18: 10 mg via INTRAVENOUS

## 2024-04-18 MED ORDER — SODIUM CHLORIDE 0.9 % IV SOLN: INTRAVENOUS | Status: DC

## 2024-04-18 MED ORDER — FAMOTIDINE IN NACL 20-0.9 MG/50ML-% IV SOLN
20.0000 mg | Freq: Once | INTRAVENOUS | Status: AC
  Administered 2024-04-18: 20 mg via INTRAVENOUS
  Filled 2024-04-18: qty 50

## 2024-04-18 MED ORDER — SODIUM CHLORIDE 0.9 % IV SOLN
350.0000 mg | Freq: Once | INTRAVENOUS | Status: AC
  Administered 2024-04-18: 350 mg via INTRAVENOUS
  Filled 2024-04-18: qty 7

## 2024-04-18 MED ORDER — APREPITANT 130 MG/18ML IV EMUL
130.0000 mg | Freq: Once | INTRAVENOUS | Status: AC
  Administered 2024-04-18: 130 mg via INTRAVENOUS
  Filled 2024-04-18: qty 18

## 2024-04-18 NOTE — Patient Instructions (Signed)
 [facility] - A DEPT OF [facility]  Discharge Instructions: Thank you for choosing [facility] to provide your oncology and hematology care.   If you have a lab appointment with the Cancer Center, please go directly to the Cancer Center and check in at the registration area.   Wear comfortable clothing and clothing appropriate for easy access to any Portacath or PICC line.   We strive to give you quality time with your provider. You may need to reschedule your appointment if you arrive late (15 or more minutes).  Arriving late affects you and other patients whose appointments are after yours.  Also, if you miss three or more appointments without notifying the office, you may be dismissed from the clinic at the provider's discretion.      For prescription refill requests, have your pharmacy contact our office and allow 72 hours for refills to be completed.    Today you received the following chemotherapy and/or immunotherapy agents taxol , carboplatin , libtayo       To help prevent nausea and vomiting after your treatment, we encourage you to take your nausea medication as directed.  BELOW ARE SYMPTOMS THAT SHOULD BE REPORTED IMMEDIATELY: *FEVER GREATER THAN 100.4 F (38 C) OR HIGHER *CHILLS OR SWEATING *NAUSEA AND VOMITING THAT IS NOT CONTROLLED WITH YOUR NAUSEA MEDICATION *UNUSUAL SHORTNESS OF BREATH *UNUSUAL BRUISING OR BLEEDING *URINARY PROBLEMS (pain or burning when urinating, or frequent urination) *BOWEL PROBLEMS (unusual diarrhea, constipation, pain near the anus) TENDERNESS IN MOUTH AND THROAT WITH OR WITHOUT PRESENCE OF ULCERS (sore throat, sores in mouth, or a toothache) UNUSUAL RASH, SWELLING OR PAIN  UNUSUAL VAGINAL DISCHARGE OR ITCHING   Items with * indicate a potential emergency and should be followed up as soon as possible or go to the Emergency Department if any problems should occur.  Please show the CHEMOTHERAPY ALERT CARD  or IMMUNOTHERAPY ALERT CARD at check-in to the Emergency Department and triage nurse.  Should you have questions after your visit or need to cancel or reschedule your appointment, please contact [facility] - A DEPT OF [facility]  Dept: [phone]  and follow the prompts.  Office hours are 8:00 a.m. to 4:30 p.m. Monday - Friday. Please note that voicemails left after 4:00 p.m. may not be returned until the following business day.  We are closed weekends and major holidays. You have access to a nurse at all times for urgent questions. Please call the main number to the clinic Dept: [phone] and follow the prompts.   For any non-urgent questions, you may also contact your provider using MyChart. We now offer e-Visits for anyone 33 and older to request care online for non-urgent symptoms. For details visit [email or web address].   Also download the MyChart app! Go to the app store, search MyChart, open the app, select [facility], and log in with your MyChart username and password.

## 2024-04-18 NOTE — Progress Notes (Signed)
 Continue Carboplatin  dose = 380 mg per Dr Sherrod.  Corinthian Mizrahi, Pharm.D., CPP 04/18/2024@10 :30 AM
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
## 2024-04-19 ENCOUNTER — Telehealth: Payer: Self-pay | Admitting: Family Medicine

## 2024-04-19 NOTE — Telephone Encounter (Signed)
 Attempted to contact Kim from [facility] no answer.  Form was original faxed to [employer] on 04/09/24, I have faxed the order to the number below

## 2024-04-19 NOTE — Telephone Encounter (Signed)
 Copied from CRM #[id number]. Topic: Clinical - Order For Equipment >> Apr 19, 2024  9:01 AM [place] MATSU wrote:  Reason for CRM: Luke from [facility] is calling in today to request for an order for a rollator walker for patient.  Per Luke she sent request on 03/30/24 and again today.    CB: [phone] Fax: [phone]  May you please assist with request.

## 2024-04-20 ENCOUNTER — Other Ambulatory Visit: Payer: Self-pay

## 2024-04-20 ENCOUNTER — Emergency Department (HOSPITAL_COMMUNITY)

## 2024-04-20 ENCOUNTER — Observation Stay (HOSPITAL_COMMUNITY)
Admission: EM | Admit: 2024-04-20 | Discharge: 2024-04-23 | Disposition: A | Discharge status: Home / Self Care | Attending: Internal Medicine | Admitting: Internal Medicine

## 2024-04-20 ENCOUNTER — Inpatient Hospital Stay: Admitting: Physician Assistant

## 2024-04-20 ENCOUNTER — Encounter (HOSPITAL_COMMUNITY): Payer: Self-pay

## 2024-04-20 ENCOUNTER — Inpatient Hospital Stay

## 2024-04-20 VITALS — BP 102/73 | HR 148 | Temp 98.0°F | Resp 17

## 2024-04-20 VITALS — BP 106/70 | HR 143 | Resp 20

## 2024-04-20 DIAGNOSIS — R54 Age-related physical debility: Secondary | ICD-10-CM | POA: Insufficient documentation

## 2024-04-20 DIAGNOSIS — Z79630 Long term (current) use of alkylating agent: Secondary | ICD-10-CM | POA: Diagnosis not present

## 2024-04-20 DIAGNOSIS — F10920 Alcohol use, unspecified with intoxication, uncomplicated: Secondary | ICD-10-CM | POA: Insufficient documentation

## 2024-04-20 DIAGNOSIS — C3412 Malignant neoplasm of upper lobe, left bronchus or lung: Secondary | ICD-10-CM

## 2024-04-20 DIAGNOSIS — J984 Other disorders of lung: Secondary | ICD-10-CM | POA: Diagnosis not present

## 2024-04-20 DIAGNOSIS — R7989 Other specified abnormal findings of blood chemistry: Secondary | ICD-10-CM | POA: Diagnosis not present

## 2024-04-20 DIAGNOSIS — R0602 Shortness of breath: Secondary | ICD-10-CM | POA: Diagnosis not present

## 2024-04-20 DIAGNOSIS — F17220 Nicotine dependence, chewing tobacco, uncomplicated: Secondary | ICD-10-CM | POA: Insufficient documentation

## 2024-04-20 DIAGNOSIS — C3411 Malignant neoplasm of upper lobe, right bronchus or lung: Secondary | ICD-10-CM | POA: Diagnosis not present

## 2024-04-20 DIAGNOSIS — R000 Tachycardia, unspecified: Secondary | ICD-10-CM | POA: Diagnosis not present

## 2024-04-20 DIAGNOSIS — J439 Emphysema, unspecified: Secondary | ICD-10-CM | POA: Diagnosis not present

## 2024-04-20 DIAGNOSIS — D61818 Other pancytopenia: Secondary | ICD-10-CM | POA: Diagnosis present

## 2024-04-20 DIAGNOSIS — D72829 Elevated white blood cell count, unspecified: Secondary | ICD-10-CM | POA: Insufficient documentation

## 2024-04-20 DIAGNOSIS — E119 Type 2 diabetes mellitus without complications: Secondary | ICD-10-CM | POA: Insufficient documentation

## 2024-04-20 DIAGNOSIS — D638 Anemia in other chronic diseases classified elsewhere: Secondary | ICD-10-CM | POA: Diagnosis not present

## 2024-04-20 DIAGNOSIS — E782 Mixed hyperlipidemia: Secondary | ICD-10-CM | POA: Diagnosis not present

## 2024-04-20 DIAGNOSIS — R918 Other nonspecific abnormal finding of lung field: Secondary | ICD-10-CM | POA: Diagnosis not present

## 2024-04-20 DIAGNOSIS — C3491 Malignant neoplasm of unspecified part of right bronchus or lung: Secondary | ICD-10-CM

## 2024-04-20 DIAGNOSIS — Z85118 Personal history of other malignant neoplasm of bronchus and lung: Secondary | ICD-10-CM | POA: Diagnosis not present

## 2024-04-20 DIAGNOSIS — D649 Anemia, unspecified: Secondary | ICD-10-CM | POA: Insufficient documentation

## 2024-04-20 DIAGNOSIS — E1142 Type 2 diabetes mellitus with diabetic polyneuropathy: Secondary | ICD-10-CM

## 2024-04-20 DIAGNOSIS — C3490 Malignant neoplasm of unspecified part of unspecified bronchus or lung: Secondary | ICD-10-CM | POA: Diagnosis present

## 2024-04-20 DIAGNOSIS — E785 Hyperlipidemia, unspecified: Secondary | ICD-10-CM | POA: Diagnosis present

## 2024-04-20 DIAGNOSIS — F17210 Nicotine dependence, cigarettes, uncomplicated: Secondary | ICD-10-CM | POA: Insufficient documentation

## 2024-04-20 DIAGNOSIS — Z5112 Encounter for antineoplastic immunotherapy: Secondary | ICD-10-CM | POA: Diagnosis not present

## 2024-04-20 LAB — CBC WITH DIFFERENTIAL/PLATELET
Abs Immature Granulocytes: 0.02 K/uL (ref 0.00–0.07)
Basophils Absolute: 0 K/uL (ref 0.0–0.1)
Basophils Relative: 1 %
Eosinophils Absolute: 0 K/uL (ref 0.0–0.5)
Eosinophils Relative: 0 %
HCT: 25.7 % — ABNORMAL LOW (ref 39.0–52.0)
Hemoglobin: 8.4 g/dL — ABNORMAL LOW (ref 13.0–17.0)
Immature Granulocytes: 1 %
Lymphocytes Relative: 16 %
Lymphs Abs: 0.5 K/uL — ABNORMAL LOW (ref 0.7–4.0)
MCH: 33.1 pg (ref 26.0–34.0)
MCHC: 32.7 g/dL (ref 30.0–36.0)
MCV: 101.2 fL — ABNORMAL HIGH (ref 80.0–100.0)
Monocytes Absolute: 0.1 K/uL (ref 0.1–1.0)
Monocytes Relative: 2 %
Neutro Abs: 2.6 K/uL (ref 1.7–7.7)
Neutrophils Relative %: 80 %
Platelets: 70 K/uL — ABNORMAL LOW (ref 150–400)
RBC: 2.54 MIL/uL — ABNORMAL LOW (ref 4.22–5.81)
RDW: 22.9 % — ABNORMAL HIGH (ref 11.5–15.5)
WBC: 3.3 K/uL — ABNORMAL LOW (ref 4.0–10.5)
nRBC: 0 % (ref 0.0–0.2)

## 2024-04-20 LAB — COMPREHENSIVE METABOLIC PANEL WITH GFR
ALT: 13 U/L (ref 0–44)
AST: 24 U/L (ref 15–41)
Albumin: 3.3 g/dL — ABNORMAL LOW (ref 3.5–5.0)
Alkaline Phosphatase: 215 U/L — ABNORMAL HIGH (ref 38–126)
Anion gap: 9 (ref 5–15)
BUN: 21 mg/dL (ref 8–23)
CO2: 21 mmol/L — ABNORMAL LOW (ref 22–32)
Calcium: 8.6 mg/dL — ABNORMAL LOW (ref 8.9–10.3)
Chloride: 104 mmol/L (ref 98–111)
Creatinine, Ser: 1.2 mg/dL (ref 0.61–1.24)
GFR, Estimated: >60 mL/min (ref >60)
Glucose, Bld: 96 mg/dL (ref 70–99)
Potassium: 4.4 mmol/L (ref 3.5–5.1)
Sodium: 134 mmol/L — ABNORMAL LOW (ref 135–145)
Total Bilirubin: 0.7 mg/dL (ref 0.0–1.2)
Total Protein: 7.5 g/dL (ref 6.5–8.1)

## 2024-04-20 LAB — URINALYSIS, W/ REFLEX TO CULTURE (INFECTION SUSPECTED)
Bacteria, UA: NONE SEEN
Bilirubin Urine: NEGATIVE
Glucose, UA: NEGATIVE mg/dL
Hgb urine dipstick: NEGATIVE
Ketones, ur: NEGATIVE mg/dL
Leukocytes,Ua: NEGATIVE
Nitrite: NEGATIVE
Protein, ur: NEGATIVE mg/dL
Specific Gravity, Urine: 1.009 (ref 1.005–1.030)
pH: 5 (ref 5.0–8.0)

## 2024-04-20 LAB — TROPONIN T, HIGH SENSITIVITY
Troponin T High Sensitivity: 20 ng/L — ABNORMAL HIGH (ref 0–19)
Troponin T High Sensitivity: 19 ng/L (ref 0–19)

## 2024-04-20 LAB — I-STAT CHEM 8, ED
BUN: 20 mg/dL (ref 8–23)
Calcium, Ion: 1.15 mmol/L (ref 1.15–1.40)
Chloride: 106 mmol/L (ref 98–111)
Creatinine, Ser: 1.4 mg/dL — ABNORMAL HIGH (ref 0.61–1.24)
Glucose, Bld: 93 mg/dL (ref 70–99)
HCT: 24 % — ABNORMAL LOW (ref 39.0–52.0)
Hemoglobin: 8.2 g/dL — ABNORMAL LOW (ref 13.0–17.0)
Potassium: 4.4 mmol/L (ref 3.5–5.1)
Sodium: 138 mmol/L (ref 135–145)
TCO2: 20 mmol/L — ABNORMAL LOW (ref 22–32)

## 2024-04-20 LAB — D-DIMER, QUANTITATIVE: D-Dimer, Quant: 2.21 ug{FEU}/mL — ABNORMAL HIGH (ref 0.00–0.50)

## 2024-04-20 MED ORDER — IOHEXOL 350 MG/ML SOLN
75.0000 mL | Freq: Once | INTRAVENOUS | Status: AC | PRN
  Administered 2024-04-20: 75 mL via INTRAVENOUS

## 2024-04-20 MED ORDER — MELATONIN 3 MG PO TABS: 3.0000 mg | ORAL_TABLET | Freq: Every evening | ORAL | Status: DC | PRN

## 2024-04-20 MED ORDER — PEGFILGRASTIM-CBQV 6 MG/0.6ML ~~LOC~~ SOSY
6.0000 mg | PREFILLED_SYRINGE | Freq: Once | SUBCUTANEOUS | Status: AC
  Administered 2024-04-20: 6 mg via SUBCUTANEOUS
  Filled 2024-04-20: qty 0.6

## 2024-04-20 MED ORDER — SODIUM CHLORIDE 0.9 % IV BOLUS
500.0000 mL | Freq: Once | INTRAVENOUS | Status: AC
  Administered 2024-04-20: 500 mL via INTRAVENOUS

## 2024-04-20 MED ORDER — ACETAMINOPHEN 650 MG RE SUPP: 650.0000 mg | Freq: Four times a day (QID) | RECTAL | Status: DC | PRN

## 2024-04-20 MED ORDER — ONDANSETRON HCL 4 MG/2ML IJ SOLN: 4.0000 mg | Freq: Four times a day (QID) | INTRAMUSCULAR | Status: DC | PRN

## 2024-04-20 MED ORDER — ACETAMINOPHEN 325 MG PO TABS
650.0000 mg | ORAL_TABLET | Freq: Four times a day (QID) | ORAL | Status: DC | PRN
  Administered 2024-04-22: 650 mg via ORAL
  Filled 2024-04-20: qty 2

## 2024-04-20 NOTE — ED Triage Notes (Signed)
 Pt brought over from cancer center. Stage 3 lung ca. GCSF injection. Had a HR of 147. Denies any worsening SOB or chest pain. Given 1/2 Liter of fluid. Last treatment 10/15. Power port accessed.

## 2024-04-20 NOTE — ED Provider Notes (Signed)
 [place] EMERGENCY DEPARTMENT AT [facility] Provider Note   CSN: [id number] Arrival date & time: 04/20/24  1515     Patient presents with: Tachycardia   [name] is a 68 y.o. male.   Patient is a 68 year old male who presents with elevated heart rate.  He has a history of squamous cell carcinoma of the lung, currently undergoing chemotherapy, hyperlipidemia, hypertension, diabetes.  He has had elevated heart rates in the past but today he was more tachycardic than he normally is per the oncology notes.  He otherwise says he does not feel bad.  He is a little bit fatigued.  He denies any vomiting or diarrhea.  He said he did not really eat too much yesterday but did drink water throughout the day.  He denies any chest pain.  He has a little bit of shortness of breath.  No abdominal pain.  No increased leg swelling.  No fevers.  He has a little bit of chest congestion but no other runny nose or URI symptoms.  He was given 1 L of IV fluids by the oncology center and sent here because his heart rate was not improving.       Prior to Admission medications   Medication Sig Start Date End Date Taking? Authorizing Provider  acetaminophen  (TYLENOL ) 325 MG tablet Take 2 tablets (650 mg total) by mouth every 6 (six) hours as needed for mild pain (or Fever >/= 101). 01/06/23   Sebastian Toribio GAILS, MD  albuterol  (VENTOLIN  HFA) 108 [phone] Base) MCG/ACT inhaler Inhale 2 puffs into the lungs every 6 (six) hours as needed for wheezing or shortness of breath. 02/16/24   Newlin, Enobong, MD  atorvastatin  (LIPITOR) 40 MG tablet Take 1 tablet (40 mg total) by mouth daily. 01/26/24   Newlin, Enobong, MD  cetirizine  (ZYRTEC ) 10 MG tablet Take 1 tablet (10 mg total) by mouth daily. 12/31/21   Newlin, Enobong, MD  clobetasol  (OLUX ) 0.05 % topical foam Apply topically 2 (two) times daily. 02/17/24   Newlin, Enobong, MD  coal tar (NEUTROGENA T-GEL) 0.5 % shampoo Apply 1 application topically daily.     [provider]  feeding supplement (ENSURE ENLIVE / ENSURE PLUS) LIQD Take 237 mLs by mouth 2 (two) times daily between meals. 01/07/23   Sebastian Toribio GAILS, MD  folic acid  (FOLVITE ) 1 MG tablet Take 1 tablet (1 mg total) by mouth daily. 01/07/23   Sebastian Toribio GAILS, MD  gabapentin  (NEURONTIN ) 300 MG capsule Take 1 capsule (300 mg total) by mouth at bedtime. 01/26/24   Newlin, Enobong, MD  hydrocortisone  (ANUSOL -HC) 25 MG suppository Place 1 suppository (25 mg total) rectally 2 (two) times daily. 02/16/20   Odis Burnard Jansky, PA-C  hydroxypropyl methylcellulose / hypromellose (ISOPTO TEARS / GONIOVISC) 2.5 % ophthalmic solution Place 1 drop into both eyes 3 (three) times daily as needed for dry eyes.    [provider]  lidocaine -prilocaine  (EMLA ) cream Apply to affected area once 02/02/24   Sherrod Sherrod, MD  magnesium  oxide (MAG-OX) 400 (240 Mg) MG tablet Take 2 tablets (800 mg total) by mouth 2 (two) times daily. 01/06/23   Sebastian Toribio GAILS, MD  methocarbamol  (ROBAXIN ) 750 MG tablet Take 1 tablet (750 mg total) by mouth every 8 (eight) hours as needed for muscle spasms. Must have office visit for refills 08/08/23   Newlin, Enobong, MD  metoprolol  succinate (TOPROL -XL) 25 MG 24 hr tablet Take 0.5 tablets (12.5 mg total) by mouth daily. 03/27/24  Newlin, Enobong, MD  Multiple Vitamin (MULTIVITAMIN WITH MINERALS) TABS tablet Take 1 tablet by mouth daily. 01/07/23   Sebastian Toribio GAILS, MD  naltrexone  (DEPADE) 50 MG tablet Take 1 tablet (50 mg total) by mouth daily. For Alcohol  abuse 01/26/24   [name], MD  nicotine  polacrilex (NICORETTE ) 2 MG gum Take 1 each (2 mg total) by mouth as needed for smoking cessation. 01/06/23   Sebastian Toribio GAILS, MD  Omega-3 Fatty Acids (FISH OIL) 1200 MG CAPS Take 1,200 mg by mouth daily.     [provider]  omeprazole  (PRILOSEC) 20 MG capsule TAKE 1 CAPSULE(20 MG) BY MOUTH DAILY 01/26/24   Newlin, Enobong, MD  ondansetron  (ZOFRAN ) 8 MG tablet  Take 1 tablet (8 mg total) by mouth every 8 (eight) hours as needed for nausea or vomiting. Start on the third day after carboplatin . 02/02/24   Sherrod Sherrod, MD  prochlorperazine  (COMPAZINE ) 10 MG tablet Take 1 tablet (10 mg total) by mouth every 6 (six) hours as needed for nausea or vomiting. 02/02/24   Sherrod Sherrod, MD  thiamine  (VITAMIN B-1) 100 MG tablet Take 100 mg by mouth daily.    [provider]    Allergies: Ace inhibitors, Furosemide , and Aspirin     Review of Systems  Constitutional:  Positive for fatigue. Negative for chills, diaphoresis and fever.  HENT:  Negative for congestion, rhinorrhea and sneezing.   Eyes: Negative.   Respiratory:  Positive for cough and shortness of breath. Negative for chest tightness.   Cardiovascular:  Negative for chest pain and leg swelling.  Gastrointestinal:  Negative for abdominal pain, diarrhea, nausea and vomiting.  Genitourinary:  Negative for difficulty urinating, flank pain and frequency.  Musculoskeletal:  Negative for arthralgias and back pain.  Skin:  Negative for rash.  Neurological:  Positive for light-headedness. Negative for speech difficulty, weakness, numbness and headaches.    Updated Vital Signs BP (!) 134/98   Pulse (!) 138   Temp 98 F (36.7 C) (Oral)   Resp (!) 23   Ht 6' 2 (1.88 m)   Wt 81.6 kg   SpO2 100%   BMI 23.11 kg/m   Physical Exam Constitutional:      Appearance: He is well-developed.  HENT:     Head: Normocephalic and atraumatic.  Eyes:     Pupils: Pupils are equal, round, and reactive to light.  Cardiovascular:     Rate and Rhythm: Regular rhythm. Tachycardia present.     Heart sounds: Normal heart sounds.  Pulmonary:     Effort: Pulmonary effort is normal. No respiratory distress.     Breath sounds: Normal breath sounds. No wheezing or rales.  Chest:     Chest wall: No tenderness.  Abdominal:     General: Bowel sounds are normal.     Palpations: Abdomen is soft.      Tenderness: There is no abdominal tenderness. There is no guarding or rebound.  Musculoskeletal:        General: Normal range of motion.     Cervical back: Normal range of motion and neck supple.  Lymphadenopathy:     Cervical: No cervical adenopathy.  Skin:    General: Skin is warm and dry.     Findings: No rash.  Neurological:     General: No focal deficit present.     Mental Status: He is alert and oriented to person, place, and time.     (all labs ordered are listed, but only abnormal results are displayed) Labs  Reviewed  COMPREHENSIVE METABOLIC PANEL WITH GFR - Abnormal; Notable for the following components:      Result Value   Sodium 134 (*)    CO2 21 (*)    Calcium  8.6 (*)    Albumin 3.3 (*)    Alkaline Phosphatase 215 (*)    All other components within normal limits  CBC WITH DIFFERENTIAL/PLATELET - Abnormal; Notable for the following components:   WBC 3.3 (*)    RBC 2.54 (*)    Hemoglobin 8.4 (*)    HCT 25.7 (*)    MCV 101.2 (*)    RDW 22.9 (*)    Platelets 70 (*)    Lymphs Abs 0.5 (*)    All other components within normal limits  D-DIMER, QUANTITATIVE - Abnormal; Notable for the following components:   D-Dimer, Quant 2.21 (*)    All other components within normal limits  I-STAT CHEM 8, ED - Abnormal; Notable for the following components:   Creatinine, Ser 1.40 (*)    TCO2 20 (*)    Hemoglobin 8.2 (*)    HCT 24.0 (*)    All other components within normal limits  TROPONIN T, HIGH SENSITIVITY - Abnormal; Notable for the following components:   Troponin T High Sensitivity 20 (*)    All other components within normal limits  URINALYSIS, W/ REFLEX TO CULTURE (INFECTION SUSPECTED)  TSH  TROPONIN T, HIGH SENSITIVITY    EKG: EKG Interpretation Date/Time:  Friday April 20 2024 16:51:28 EDT Ventricular Rate:  139 PR Interval:  109 QRS Duration:  92 QT Interval:  291 QTC Calculation: 443 R Axis:   49  Text Interpretation: Sinus tachycardia Nonspecific  repol abnormality, diffuse leads since last tracing no significant change Confirmed by Lenor Hollering [id number]) on 04/20/2024 5:13:39 PM  Radiology: CT Angio Chest PE W and/or Wo Contrast Result Date: 04/20/2024 EXAM: CTA CHEST 04/20/2024 09:34:25 PM TECHNIQUE: CTA of the chest was performed after the administration of 75 mL of iohexol  (OMNIPAQUE ) 350 MG/ML injection. Multiplanar reformatted images are provided for review. MIP images are provided for review. Automated exposure control, iterative reconstruction, and/or weight based adjustment of the mA/kV was utilized to reduce the radiation dose to as low as reasonably achievable. COMPARISON: Comparison with same day x-ray and CT dated 09/02/2023. CLINICAL HISTORY: Pulmonary embolism (PE) suspected, low to intermediate prob, positive D-dimer. Pt needs to use bathroom@2055 ; Table formatting from the original note was not included.; Notes from triage:; Pt brought over from cancer center. Stage 3 lung ca. GCSF injection. Had a HR of 147. Denies any worsening SOB or chest pain. Given 1/2 Liter of fluid. Last treatment 10/15. Power port accessed. FINDINGS: PULMONARY ARTERIES: Pulmonary arteries are adequately opacified for evaluation. No acute pulmonary embolus. Main pulmonary artery is normal in caliber. MEDIASTINUM: The heart and pericardium demonstrate no acute abnormality. There is no acute abnormality of the thoracic aorta. Debris in the trachea. LYMPH NODES: Calcified mediastinal lymph nodes. No hilar or axillary lymphadenopathy. LUNGS AND PLEURA: Emphysema. Cavitary lesion in the left upper lobe on series 12 image 66 is similar in size measuring 2.2 cm with increased gaseous component. There is increased air component. Extensive post-radiation change about the right hilum is similar to prior. Solid and sub-solid right upper lobe nodule on series 10 image 114 demonstrates an increased solid component compared to 09/02/2023 now measuring 7 mm. The overall extent  of the lesion measures 13 mm (series 12 image 47 and 12/46). Similar left upper lobe nodules. For example  5 mm subpleural nodule on series 12 image 80. No evidence of pleural effusion or pneumothorax. UPPER ABDOMEN: Limited images of the upper abdomen demonstrate nonobstructing left nephrolithiasis. SOFT TISSUES AND BONES: Chest wall port-a-cath. No acute bone or soft tissue abnormality. IMPRESSION: 1. No pulmonary embolism. 2. Similar size of the left upper lobe cavitary lesion with increased gaseous component. 3. Right upper lobe part-solid nodule with increased solid component, solid portion 7 mm and overall extent 13 mm, concerning for um malignancy. 4. Additional small left upper lobe nodules, including a 5 mm subpleural nodule. Electronically signed by: Norman Gatlin MD 04/20/2024 10:21 PM EDT RP Workstation: [id number]   DG Chest 2 View Result Date: 04/20/2024 CLINICAL DATA:  Shortness of breath.  History of lung cancer. EXAM: CHEST - 2 VIEW COMPARISON:  Chest x-ray 01/04/2023 and chest CT 10/29/2023 FINDINGS: Extensive radiation changes noted in the right lower lobe. Knee small cavitary lesion in the left mid lung appears more cavitary when compared to the prior chest CT but indeterminate. No pulmonary edema or pleural effusions. Underlying emphysematous changes again noted. The right IJ power port is stable. IMPRESSION: 1. Extensive radiation changes in the right lower lobe. 2. Small cavitary lesion in the left mid lung appears more cavitary when compared to the prior chest CT but indeterminate. 3. No acute pulmonary findings. Electronically Signed   By: MYRTIS Stammer M.D.   On: 04/20/2024 17:33     Procedures   Medications Ordered in the ED  sodium chloride  0.9 % bolus 500 mL (0 mLs Intravenous Stopped 04/20/24 1907)  iohexol  (OMNIPAQUE ) 350 MG/ML injection 75 mL (75 mLs Intravenous Contrast Given 04/20/24 2124)  sodium chloride  0.9 % bolus 500 mL (0 mLs Intravenous Stopped 04/20/24 2222)                                     Medical Decision Making Amount and/or Complexity of Data Reviewed Labs: ordered. Radiology: ordered.  Risk Prescription drug management.   This patient presents to the ED for concern of tachycardia, this involves an extensive number of treatment options, and is a complaint that carries with it a high risk of complications and morbidity.  I considered the following differential and admission for this acute, potentially life threatening condition.  The differential diagnosis includes dehydration, arrhythmia, PE, pneumonia, sepsis, anemia, other electrolyte abnormality  MDM:    Patient is a 68 year old who presents with tachycardia.  He has a little bit of lightheadedness and a little bit of shortness of breath but otherwise no symptoms.  No chest pain.  No suggestions of fluid overload.  He is not febrile.  His labs are reassuring.  His hemoglobin is low but similar to prior values.  He has some mild neutropenia.  His D-dimer is elevated.  CT of the chest does not show any evidence of PE.  He does have the cancerous lesions but otherwise nonconcerning.  No evidence of pneumonia.  Chest x-ray did not show any obvious pneumonia or fluid overload.  His EKG shows what appears to be a sinus tachycardia.  It has not really changed from the 130s since he arrived.  He was given IV fluids with 1 L infused at the cancer center and another liter here in the ED.  His heart rate really has not budged.  I did consult with the cardiologist on-call, Dr. Cesario who reviewed the EKG and agrees that it looks  to be sinus tachycardia.  His heart rate remains about 130 but will go up to 140 when he is talking.  I do not feel that I can discharge him with this high of a heart rate.  Will add a TSH on.  Discussed with Dr. Cathern who will admit the patient for further treatment.  (Labs, imaging, consults)  Labs: I Ordered, and personally interpreted labs.  The pertinent results  include: Anemia, mildly elevated troponin, elevated D-dimer  Imaging Studies ordered: I ordered imaging studies including chest x-ray, CT chest I independently visualized and interpreted imaging. I agree with the radiologist interpretation  Additional history obtained from chart.  External records from outside source obtained and reviewed including history  Cardiac Monitoring: The patient was maintained on a cardiac monitor.  If on the cardiac monitor, I personally viewed and interpreted the cardiac monitored which showed an underlying rhythm of: Sinus tachycardia  Reevaluation: After the interventions noted above, I reevaluated the patient and found that they have :stayed the same  Social Determinants of Health:    Disposition: Admit to hospital  Co morbidities that complicate the patient evaluation  Past Medical History:  Diagnosis Date   Acid reflux    Arthritis    Diabetes mellitus    Diverticulosis 2014   seen on colonoscopy   Drug-induced skin rash 03/17/2017   Encounter for antineoplastic chemotherapy 10/09/2016   Encounter for antineoplastic immunotherapy 12/21/2016   Encounter for smoking cessation counseling 08/10/2016   GERD (gastroesophageal reflux disease)    Goals of care, counseling/discussion 10/09/2016   Gout    History of kidney stones    Hyperlipidemia    Hypertension    Incarcerated ventral hernia    Mouth bleeding    upper teeth right back side loose and bleed at night   Pancreatitis    Rectal bleeding    intermittently for years   Stage III squamous cell carcinoma of right lung (HCC) 10/07/2016     Medicines Meds ordered this encounter  Medications   sodium chloride  0.9 % bolus 500 mL   iohexol  (OMNIPAQUE ) 350 MG/ML injection 75 mL   sodium chloride  0.9 % bolus 500 mL    I have reviewed the patients home medicines and have made adjustments as needed  Problem List / ED Course: Problem List Items Addressed This Visit   None Visit Diagnoses        Tachycardia    -  Primary                Final diagnoses:  Tachycardia    ED Discharge Orders     None          Lenor Hollering, MD 04/20/24 2325

## 2024-04-20 NOTE — Progress Notes (Signed)
 Patient here in flush for udenyca  injection. While checking his VS, HR is 147. Radial pulse palpated, accurate compared to the machine. BP 102/73. Patient reports a little shortness of breath. States that he doesn't feel like his heart is racing. [facility] made aware. Elenor, RN chairside to assess patient. Patient moved to [facility] for EKG.

## 2024-04-20 NOTE — Progress Notes (Signed)
 Notified by flush room LPN as patient's tachycardia.  Briefly this is a 68 year old male with a history of metastatic non-small cell lung cancer followed by Dr. Gatha.  Patient presented today for his G-CSF injection.  On arrival to infusion center he had a heart rate of 147 and BP of 102/73.  Patient admitted to feeling a little short of breath although not significantly changed from baseline.  He denies any chest pain.  EKG obtained and shows SVT with short PR interval.  Patient started on 1 L of IV fluids.  He was reassessed after receiving 500 mL and there was no improvement.  Heart rate ranging from 140s to 160.  Chart review shows patient has been tachycardic in the past however not this high.  Chart review shows cardiac history of hypertension.  With persistent SVT patient will need further evaluation in the emergency department.  He is agreeable with this plan.  Patient did receive his injection prior to ED transfer.  Report given to accepting ED RN.  Vitals:   04/20/24 1425 04/20/24 1458 04/20/24 1459 04/20/24 1503  BP:    106/70  Pulse: (!) 145 (!) 143 (!) 140 (!) 143  Resp:    20  SpO2:    100%   I have spent a total of 10 minutes minutes of face-to-face and non-face-to-face time preparing to see the patient, counseling and educating the patient, ordering tests//medications, documenting clinical information in the electronic health record, and care coordination.
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
## 2024-04-21 ENCOUNTER — Encounter (HOSPITAL_COMMUNITY): Payer: Self-pay | Admitting: Internal Medicine

## 2024-04-21 DIAGNOSIS — E785 Hyperlipidemia, unspecified: Secondary | ICD-10-CM | POA: Diagnosis not present

## 2024-04-21 DIAGNOSIS — E1142 Type 2 diabetes mellitus with diabetic polyneuropathy: Secondary | ICD-10-CM | POA: Diagnosis not present

## 2024-04-21 DIAGNOSIS — C3411 Malignant neoplasm of upper lobe, right bronchus or lung: Secondary | ICD-10-CM | POA: Diagnosis not present

## 2024-04-21 DIAGNOSIS — R000 Tachycardia, unspecified: Secondary | ICD-10-CM | POA: Diagnosis not present

## 2024-04-21 DIAGNOSIS — D72829 Elevated white blood cell count, unspecified: Secondary | ICD-10-CM | POA: Diagnosis not present

## 2024-04-21 DIAGNOSIS — R54 Age-related physical debility: Secondary | ICD-10-CM | POA: Diagnosis not present

## 2024-04-21 DIAGNOSIS — C3490 Malignant neoplasm of unspecified part of unspecified bronchus or lung: Secondary | ICD-10-CM | POA: Diagnosis present

## 2024-04-21 DIAGNOSIS — E8721 Acute metabolic acidosis: Secondary | ICD-10-CM | POA: Diagnosis not present

## 2024-04-21 DIAGNOSIS — R918 Other nonspecific abnormal finding of lung field: Secondary | ICD-10-CM | POA: Diagnosis not present

## 2024-04-21 LAB — PROCALCITONIN: Procalcitonin: 0.29 ng/mL

## 2024-04-21 LAB — CBC WITH DIFFERENTIAL/PLATELET
Abs Immature Granulocytes: 0.64 K/uL — ABNORMAL HIGH (ref 0.00–0.07)
Basophils Absolute: 0.1 K/uL (ref 0.0–0.1)
Basophils Relative: 0 %
Eosinophils Absolute: 0 K/uL (ref 0.0–0.5)
Eosinophils Relative: 0 %
HCT: 26.6 % — ABNORMAL LOW (ref 39.0–52.0)
Hemoglobin: 8.6 g/dL — ABNORMAL LOW (ref 13.0–17.0)
Immature Granulocytes: 3 %
Lymphocytes Relative: 4 %
Lymphs Abs: 0.8 K/uL (ref 0.7–4.0)
MCH: 32 pg (ref 26.0–34.0)
MCHC: 32.3 g/dL (ref 30.0–36.0)
MCV: 98.9 fL (ref 80.0–100.0)
Monocytes Absolute: 0.1 K/uL (ref 0.1–1.0)
Monocytes Relative: 1 %
Neutro Abs: 17.3 K/uL — ABNORMAL HIGH (ref 1.7–7.7)
Neutrophils Relative %: 92 %
Platelets: 73 K/uL — ABNORMAL LOW (ref 150–400)
RBC: 2.69 MIL/uL — ABNORMAL LOW (ref 4.22–5.81)
RDW: 22.7 % — ABNORMAL HIGH (ref 11.5–15.5)
WBC: 19 K/uL — ABNORMAL HIGH (ref 4.0–10.5)
nRBC: 0 % (ref 0.0–0.2)

## 2024-04-21 LAB — COMPREHENSIVE METABOLIC PANEL WITH GFR
ALT: 10 U/L (ref 0–44)
AST: 21 U/L (ref 15–41)
Albumin: 3.2 g/dL — ABNORMAL LOW (ref 3.5–5.0)
Alkaline Phosphatase: 212 U/L — ABNORMAL HIGH (ref 38–126)
Anion gap: 9 (ref 5–15)
BUN: 19 mg/dL (ref 8–23)
CO2: 20 mmol/L — ABNORMAL LOW (ref 22–32)
Calcium: 8.7 mg/dL — ABNORMAL LOW (ref 8.9–10.3)
Chloride: 105 mmol/L (ref 98–111)
Creatinine, Ser: 1.05 mg/dL (ref 0.61–1.24)
GFR, Estimated: >60 mL/min (ref >60)
Glucose, Bld: 85 mg/dL (ref 70–99)
Potassium: 4.7 mmol/L (ref 3.5–5.1)
Sodium: 135 mmol/L (ref 135–145)
Total Bilirubin: 0.8 mg/dL (ref 0.0–1.2)
Total Protein: 7.1 g/dL (ref 6.5–8.1)

## 2024-04-21 LAB — APTT: aPTT: 32 s (ref 24–36)

## 2024-04-21 LAB — MAGNESIUM
Magnesium: 1.5 mg/dL — ABNORMAL LOW (ref 1.7–2.4)
Magnesium: 1.5 mg/dL — ABNORMAL LOW (ref 1.7–2.4)

## 2024-04-21 LAB — GLUCOSE, CAPILLARY
Glucose-Capillary: 109 mg/dL — ABNORMAL HIGH (ref 70–99)
Glucose-Capillary: 118 mg/dL — ABNORMAL HIGH (ref 70–99)

## 2024-04-21 LAB — FERRITIN: Ferritin: 277 ng/mL (ref 24–336)

## 2024-04-21 LAB — CBG MONITORING, ED
Glucose-Capillary: 96 mg/dL (ref 70–99)
Glucose-Capillary: 101 mg/dL — ABNORMAL HIGH (ref 70–99)

## 2024-04-21 LAB — IRON AND TIBC
Iron: 223 ug/dL — ABNORMAL HIGH (ref 45–182)
Saturation Ratios: 88 % — ABNORMAL HIGH (ref 17.9–39.5)
TIBC: 253 ug/dL (ref 250–450)
UIBC: 30 ug/dL

## 2024-04-21 LAB — RETICULOCYTES
Immature Retic Fract: 0.7 % — ABNORMAL LOW (ref 2.3–15.9)
RBC.: 2.2 MIL/uL — ABNORMAL LOW (ref 4.22–5.81)
Retic Count, Absolute: 32.4 K/uL (ref 19.0–186.0)
Retic Ct Pct: 1.5 % (ref 0.4–3.1)

## 2024-04-21 LAB — PRO BRAIN NATRIURETIC PEPTIDE: Pro Brain Natriuretic Peptide: 819 pg/mL — ABNORMAL HIGH (ref <300.0)

## 2024-04-21 LAB — VITAMIN B12: Vitamin B-12: 3237 pg/mL — ABNORMAL HIGH (ref 180–914)

## 2024-04-21 LAB — FOLATE: Folate: 7.7 ng/mL (ref >5.9)

## 2024-04-21 LAB — LACTATE DEHYDROGENASE: LDH: 122 U/L (ref 98–192)

## 2024-04-21 LAB — PROTIME-INR
INR: 1.1 (ref 0.8–1.2)
Prothrombin Time: 14.5 s (ref 11.4–15.2)

## 2024-04-21 LAB — TSH: TSH: 3.15 u[IU]/mL (ref 0.350–4.500)

## 2024-04-21 MED ORDER — GABAPENTIN 300 MG PO CAPS
300.0000 mg | ORAL_CAPSULE | Freq: Every day | ORAL | Status: DC
  Administered 2024-04-21 – 2024-04-22 (×2): 300 mg via ORAL
  Filled 2024-04-21 (×2): qty 1

## 2024-04-21 MED ORDER — INFLUENZA VAC SPLIT HIGH-DOSE 0.5 ML IM SUSY
0.5000 mL | PREFILLED_SYRINGE | INTRAMUSCULAR | Status: DC
  Filled 2024-04-21: qty 0.5

## 2024-04-21 MED ORDER — SODIUM CHLORIDE 0.9% FLUSH
10.0000 mL | INTRAVENOUS | Status: DC | PRN
  Administered 2024-04-22: 10 mL

## 2024-04-21 MED ORDER — ATORVASTATIN CALCIUM 40 MG PO TABS
40.0000 mg | ORAL_TABLET | Freq: Every day | ORAL | Status: DC
  Administered 2024-04-21 – 2024-04-23 (×3): 40 mg via ORAL
  Filled 2024-04-21 (×3): qty 1

## 2024-04-21 MED ORDER — MAGNESIUM SULFATE 2 GM/50ML IV SOLN
2.0000 g | Freq: Once | INTRAVENOUS | Status: AC
  Administered 2024-04-21: 2 g via INTRAVENOUS
  Filled 2024-04-21: qty 50

## 2024-04-21 MED ORDER — PROCHLORPERAZINE EDISYLATE 10 MG/2ML IJ SOLN: 10.0000 mg | Freq: Four times a day (QID) | INTRAMUSCULAR | Status: DC | PRN

## 2024-04-21 MED ORDER — CHLORHEXIDINE GLUCONATE CLOTH 2 % EX PADS
6.0000 | MEDICATED_PAD | Freq: Every day | CUTANEOUS | Status: DC
  Administered 2024-04-21 – 2024-04-23 (×3): 6 via TOPICAL

## 2024-04-21 MED ORDER — SODIUM CHLORIDE 0.9% FLUSH
10.0000 mL | Freq: Two times a day (BID) | INTRAVENOUS | Status: DC
  Administered 2024-04-22 – 2024-04-23 (×2): 10 mL

## 2024-04-21 MED ORDER — INSULIN ASPART 100 UNIT/ML IJ SOLN
0.0000 [IU] | Freq: Three times a day (TID) | INTRAMUSCULAR | Status: DC
  Filled 2024-04-21: qty 0.06

## 2024-04-21 MED ORDER — METOPROLOL SUCCINATE ER 25 MG PO TB24
25.0000 mg | ORAL_TABLET | Freq: Every day | ORAL | Status: DC
  Administered 2024-04-21: 25 mg via ORAL
  Filled 2024-04-21: qty 1

## 2024-04-21 MED ORDER — METOPROLOL TARTRATE 25 MG PO TABS
25.0000 mg | ORAL_TABLET | Freq: Four times a day (QID) | ORAL | Status: DC
  Administered 2024-04-21 – 2024-04-22 (×5): 25 mg via ORAL
  Filled 2024-04-21 (×5): qty 1

## 2024-04-21 NOTE — Progress Notes (Signed)
 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  On presentation, heart rates were in the 130s to 140s with EKG showing sinus tachycardia.  CTA chest showed no pulmonary embolism but showed left upper lobe cavitary lesion and right upper lobe nodule with additional small left upper lobe nodules.  He was given IV fluids and started on oral metoprolol .  ED provider discussed the EKG with on-call cardiology fellow and it was deemed the patient had sinus tachycardia.  Assessment & Plan:   Sinus tachycardia - Questionable cause.  No evidence of any infection.  CT chest as above. -Probably from electrolyte abnormalities.  Place electrolytes - Switch metoprolol  to succinate to metoprolol  tartrate for now.  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  Outpatient follow-up with oncology.  No signs of bleeding.  Monitor hemoglobin and platelets intermittently.  Leukocytosis - Possibly reactive.  Monitor  Hypomagnesemia - Replace.  Repeat a.m. labs  Acute metabolic acidosis -Mild.  Monitor.  Diabetes mellitus type II Peripheral neuropathy -Continue CBG with SSI.  Carb modified diet.  Continue gabapentin   Hyperlipidemia -Continue statin  DVT prophylaxis: SCDs Code Status: Full Family Communication: None at bedside Disposition Plan: Status is: Observation The patient will require care spanning > 2 midnights and should be moved to inpatient because:  Of severity of illness   Consultants: None  Procedures: None  Antimicrobials: None   Subjective: Patient seen and examined at bedside.  Feels short of breath with exertion.  Denies any current chest pain.  No fever or vomiting reported.  Objective: Vitals:   04/21/24 0504 04/21/24 0515 04/21/24 0700 04/21/24 0715  BP: 112/86 124/82 114/86 117/84  Pulse: (!) 147 (!) 148 (!) 139 (!) 141  Resp:  (!) 24 (!) 22 (!) 23  Temp:  98.4 F (36.9 C)    TempSrc:      SpO2:  98% 98% 98%  Weight:      Height:        Intake/Output Summary (Last 24 hours) at 04/21/2024 0751 Last data filed at 04/20/2024 1907 Gross per 24 hour  Intake 500 ml  Output --  Net 500 ml   Filed Weights   04/20/24 1528  Weight: 81.6 kg    Examination:  General exam: Appears calm and comfortable  Respiratory system: Bilateral decreased breath sounds at bases with intermittent tachypnea Cardiovascular system: S1 & S2 heard, tachycardic Gastrointestinal system: Abdomen is nondistended, soft and nontender. Normal bowel sounds heard. Extremities: No cyanosis, clubbing, edema  Central nervous system: Alert and oriented. No focal neurological deficits. Moving extremities Skin: No rashes, lesions or ulcers Psychiatry: Flat affect.  Not agitated.    Data Reviewed: I have personally reviewed following labs and imaging studies  CBC: Recent Labs  Lab 04/18/24 0900 04/20/24 1701 04/20/24 1739 04/21/24 0505  WBC 4.0 3.3*  --  19.0*  NEUTROABS 2.7 2.6  --  17.3*  HGB 8.4* 8.4* 8.2* 8.6*  HCT 24.8* 25.7* 24.0* 26.6*  MCV 95.8 101.2*  --  98.9  PLT 84* 70*  --  73*   Basic Metabolic Panel: Recent Labs  Lab 04/18/24 0900 04/20/24 1701 04/20/24 1739 04/21/24 0505  NA 133* 134* 138 135  K 4.3 4.4 4.4 4.7  CL 102 104 106 105  CO2 25 21*  --  20*  GLUCOSE 83 96 93 85  BUN 18 21 20 19   CREATININE 1.59* 1.20 1.40* 1.05  CALCIUM  9.4 8.6*  --  8.7*  MG  --   --   --  1.5*   GFR: Estimated  Creatinine Clearance: 76.6 mL/min (by C-G formula based on SCr of 1.05 mg/dL). Liver Function Tests: Recent Labs  Lab 04/18/24 0900 04/20/24 1701 04/21/24 0505  AST 23 24 21   ALT 13 13 10   ALKPHOS 248* 215* 212*  BILITOT 0.6 0.7 0.8  PROT 7.9 7.5 7.1  ALBUMIN 3.2* 3.3* 3.2*   No results for input(s): LIPASE, AMYLASE in the last 168 hours. No results for input(s): AMMONIA in the last 168 hours. Coagulation Profile: Recent Labs  Lab 04/21/24 0505  INR 1.1   Cardiac Enzymes: No results for input(s): CKTOTAL, CKMB, CKMBINDEX, TROPONINI in the last 168 hours. BNP (last 3 results) Recent Labs    04/21/24 0505  PROBNP 819.0*   HbA1C: No results for input(s): HGBA1C in the last 72 hours. CBG: No results for input(s): GLUCAP in the last 168 hours. Lipid Profile: No results for input(s): CHOL, HDL, LDLCALC, TRIG, CHOLHDL, LDLDIRECT in the last 72 hours. Thyroid  Function Tests: Recent Labs    04/21/24 0505  TSH 3.150   Anemia Panel: Recent Labs    04/21/24 0505  VITAMINB12 3,237*  FOLATE 7.7  FERRITIN 277  TIBC 253  IRON 223*  RETICCTPCT 1.5   Sepsis Labs: No results for input(s): PROCALCITON, LATICACIDVEN in the last 168 hours.  No results found for this or any previous visit (from the past 240 hours).       Radiology Studies: CT Angio Chest PE W and/or Wo Contrast Result Date: 04/20/2024 EXAM: CTA CHEST 04/20/2024 09:34:25 PM TECHNIQUE: CTA of the chest was performed after the administration of 75 mL of iohexol  (OMNIPAQUE ) 350 MG/ML injection. Multiplanar reformatted images are provided for review. MIP images are provided for review. Automated exposure control, iterative reconstruction, and/or weight based adjustment of the mA/kV was utilized to reduce the radiation dose to as low as reasonably achievable. COMPARISON: Comparison with same day x-ray and CT dated 09/02/2023. CLINICAL HISTORY: Pulmonary embolism (PE) suspected,  low to intermediate prob, positive D-dimer. Pt needs to use bathroom@2055 ; Table formatting from the original note was not included.; Notes from triage:; Pt brought over from cancer center. Stage 3 lung ca. GCSF injection. Had a HR of 147. Denies any worsening SOB or chest pain. Given 1/2 Liter of fluid. Last treatment 10/15. Power port accessed. FINDINGS: PULMONARY ARTERIES: Pulmonary arteries are adequately opacified for evaluation. No acute pulmonary embolus. Main pulmonary artery is normal in caliber. MEDIASTINUM: The heart and pericardium demonstrate no acute abnormality. There is no acute abnormality of the thoracic aorta. Debris in the trachea. LYMPH NODES: Calcified mediastinal lymph nodes. No hilar or axillary lymphadenopathy. LUNGS AND PLEURA: Emphysema. Cavitary lesion in the left upper lobe on series 12 image 66 is similar in size measuring 2.2 cm with increased gaseous component. There is increased air component. Extensive post-radiation change about the right hilum is similar to prior. Solid and sub-solid right upper lobe nodule on series 10 image 114 demonstrates an increased solid component compared to 09/02/2023 now measuring 7 mm. The  overall extent of the lesion measures 13 mm (series 12 image 47 and 12/46). Similar left upper lobe nodules. For example 5 mm subpleural nodule on series 12 image 80. No evidence of pleural effusion or pneumothorax. UPPER ABDOMEN: Limited images of the upper abdomen demonstrate nonobstructing left nephrolithiasis. SOFT TISSUES AND BONES: Chest wall port-a-cath. No acute bone or soft tissue abnormality. IMPRESSION: 1. No pulmonary embolism. 2. Similar size of the left upper lobe cavitary lesion with increased gaseous component. 3. Right upper lobe part-solid nodule with increased solid component, solid portion 7 mm and overall extent 13 mm, concerning for um malignancy. 4. Additional small left upper lobe nodules, including a 5 mm subpleural nodule. Electronically  signed by: Norman Gatlin MD 04/20/2024 10:21 PM EDT RP Workstation: [id number]   DG Chest 2 View Result Date: 04/20/2024 CLINICAL DATA:  Shortness of breath.  History of lung cancer. EXAM: CHEST - 2 VIEW COMPARISON:  Chest x-ray 01/04/2023 and chest CT 10/29/2023 FINDINGS: Extensive radiation changes noted in the right lower lobe. Knee small cavitary lesion in the left mid lung appears more cavitary when compared to the prior chest CT but indeterminate. No pulmonary edema or pleural effusions. Underlying emphysematous changes again noted. The right IJ power port is stable. IMPRESSION: 1. Extensive radiation changes in the right lower lobe. 2. Small cavitary lesion in the left mid lung appears more cavitary when compared to the prior chest CT but indeterminate. 3. No acute pulmonary findings. Electronically Signed   By: MYRTIS Stammer M.D.   On: 04/20/2024 17:33        Scheduled Meds:  atorvastatin   40 mg Oral Daily   gabapentin   300 mg Oral QHS   insulin  aspart  0-6 Units Subcutaneous TID WC   metoprolol  succinate  25 mg Oral Daily   Continuous Infusions:        Sophie Mao, MD [facility] 04/21/2024, 7:51 AM

## 2024-04-21 NOTE — Progress Notes (Signed)
Assumed care of patient from previous RN, agree with previous nurses assessment.

## 2024-04-22 DIAGNOSIS — R000 Tachycardia, unspecified: Secondary | ICD-10-CM | POA: Diagnosis not present

## 2024-04-22 DIAGNOSIS — E1142 Type 2 diabetes mellitus with diabetic polyneuropathy: Secondary | ICD-10-CM | POA: Diagnosis not present

## 2024-04-22 DIAGNOSIS — E8721 Acute metabolic acidosis: Secondary | ICD-10-CM | POA: Diagnosis not present

## 2024-04-22 DIAGNOSIS — C3411 Malignant neoplasm of upper lobe, right bronchus or lung: Secondary | ICD-10-CM | POA: Diagnosis not present

## 2024-04-22 DIAGNOSIS — E785 Hyperlipidemia, unspecified: Secondary | ICD-10-CM | POA: Diagnosis not present

## 2024-04-22 DIAGNOSIS — D72829 Elevated white blood cell count, unspecified: Secondary | ICD-10-CM | POA: Diagnosis not present

## 2024-04-22 DIAGNOSIS — R918 Other nonspecific abnormal finding of lung field: Secondary | ICD-10-CM | POA: Diagnosis not present

## 2024-04-22 LAB — COMPREHENSIVE METABOLIC PANEL WITH GFR
ALT: 12 U/L (ref 0–44)
AST: 26 U/L (ref 15–41)
Albumin: 3.1 g/dL — ABNORMAL LOW (ref 3.5–5.0)
Alkaline Phosphatase: 230 U/L — ABNORMAL HIGH (ref 38–126)
Anion gap: 9 (ref 5–15)
BUN: 21 mg/dL (ref 8–23)
CO2: 20 mmol/L — ABNORMAL LOW (ref 22–32)
Calcium: 8.9 mg/dL (ref 8.9–10.3)
Chloride: 102 mmol/L (ref 98–111)
Creatinine, Ser: 1.1 mg/dL (ref 0.61–1.24)
GFR, Estimated: >60 mL/min (ref >60)
Glucose, Bld: 90 mg/dL (ref 70–99)
Potassium: 4.7 mmol/L (ref 3.5–5.1)
Sodium: 131 mmol/L — ABNORMAL LOW (ref 135–145)
Total Bilirubin: 0.7 mg/dL (ref 0.0–1.2)
Total Protein: 7 g/dL (ref 6.5–8.1)

## 2024-04-22 LAB — CBC WITH DIFFERENTIAL/PLATELET
Abs Immature Granulocytes: 0.49 K/uL — ABNORMAL HIGH (ref 0.00–0.07)
Basophils Absolute: 0 K/uL (ref 0.0–0.1)
Basophils Relative: 0 %
Eosinophils Absolute: 0.1 K/uL (ref 0.0–0.5)
Eosinophils Relative: 0 %
HCT: 19.9 % — ABNORMAL LOW (ref 39.0–52.0)
Hemoglobin: 8.1 g/dL — ABNORMAL LOW (ref 13.0–17.0)
Immature Granulocytes: 4 %
Lymphocytes Relative: 6 %
Lymphs Abs: 0.8 K/uL (ref 0.7–4.0)
MCH: 42.9 pg — ABNORMAL HIGH (ref 26.0–34.0)
MCHC: 32 g/dL (ref 30.0–36.0)
MCV: 105.3 fL — ABNORMAL HIGH (ref 80.0–100.0)
Monocytes Absolute: 0.1 K/uL (ref 0.1–1.0)
Monocytes Relative: 1 %
Neutro Abs: 12.3 K/uL — ABNORMAL HIGH (ref 1.7–7.7)
Neutrophils Relative %: 89 %
Platelets: 65 K/uL — ABNORMAL LOW (ref 150–400)
RBC: 1.89 MIL/uL — ABNORMAL LOW (ref 4.22–5.81)
RDW: 23.7 % — ABNORMAL HIGH (ref 11.5–15.5)
WBC: 13.8 K/uL — ABNORMAL HIGH (ref 4.0–10.5)
nRBC: 0 % (ref 0.0–0.2)

## 2024-04-22 LAB — GLUCOSE, CAPILLARY
Glucose-Capillary: 102 mg/dL — ABNORMAL HIGH (ref 70–99)
Glucose-Capillary: 121 mg/dL — ABNORMAL HIGH (ref 70–99)
Glucose-Capillary: 125 mg/dL — ABNORMAL HIGH (ref 70–99)
Glucose-Capillary: 130 mg/dL — ABNORMAL HIGH (ref 70–99)

## 2024-04-22 LAB — MAGNESIUM: Magnesium: 1.9 mg/dL (ref 1.7–2.4)

## 2024-04-22 MED ORDER — POLYETHYLENE GLYCOL 3350 17 G PO PACK: 17.0000 g | PACK | Freq: Every day | ORAL | Status: DC | PRN

## 2024-04-22 MED ORDER — SENNOSIDES-DOCUSATE SODIUM 8.6-50 MG PO TABS
1.0000 | ORAL_TABLET | Freq: Two times a day (BID) | ORAL | Status: DC
  Administered 2024-04-22 – 2024-04-23 (×3): 1 via ORAL
  Filled 2024-04-22 (×3): qty 1

## 2024-04-22 MED ORDER — ALUM & MAG HYDROXIDE-SIMETH 200-200-20 MG/5ML PO SUSP
30.0000 mL | Freq: Once | ORAL | Status: AC
Start: 2024-04-23 — End: 2024-04-22
  Administered 2024-04-22: 30 mL via ORAL
  Filled 2024-04-22: qty 30

## 2024-04-22 MED ORDER — BENZOCAINE 10 % MT GEL
Freq: Four times a day (QID) | OROMUCOSAL | Status: DC | PRN
Start: 2024-04-22 — End: 2024-04-23
  Administered 2024-04-22: 1 via OROMUCOSAL
  Filled 2024-04-22: qty 9.4

## 2024-04-22 MED ORDER — BISACODYL 10 MG RE SUPP: 10.0000 mg | Freq: Every day | RECTAL | Status: DC | PRN

## 2024-04-22 MED ORDER — LIDOCAINE VISCOUS HCL 2 % MT SOLN
15.0000 mL | Freq: Once | OROMUCOSAL | Status: AC
  Administered 2024-04-22: 15 mL via ORAL
  Filled 2024-04-22: qty 15

## 2024-04-22 MED ORDER — METOPROLOL TARTRATE 50 MG PO TABS
50.0000 mg | ORAL_TABLET | Freq: Four times a day (QID) | ORAL | Status: DC
  Administered 2024-04-22 – 2024-04-23 (×3): 50 mg via ORAL
  Filled 2024-04-22 (×4): qty 1

## 2024-04-22 MED ORDER — VANCOMYCIN HCL 1750 MG/350ML IV SOLN
1750.0000 mg | INTRAVENOUS | Status: DC
  Filled 2024-04-22: qty 350

## 2024-04-22 NOTE — Progress Notes (Signed)
 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  On presentation, heart rates were in the 130s to 140s with EKG showing sinus tachycardia.  CTA chest showed no pulmonary embolism but showed left upper lobe cavitary lesion and right upper lobe nodule with additional small left upper lobe nodules.  He was given IV fluids and started on oral metoprolol .  ED provider discussed the EKG with on-call cardiology fellow and it was deemed the patient had sinus tachycardia.  Assessment & Plan:   Sinus tachycardia - Questionable cause.  No evidence of any infection.  CT chest as above. - Electrolytes are improved. - Still tachycardic this morning.  Increase metoprolol  to 50 mg every 6 hours for today.  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  Outpatient follow-up with oncology.  No signs of bleeding.  Monitor hemoglobin and platelets intermittently.  Leukocytosis - Possibly reactive.  Improving.  Monitor  Hypomagnesemia - Improved  Acute metabolic acidosis - Mild.  Monitor  Diabetes mellitus type II Peripheral neuropathy -Continue CBG with SSI.  Carb modified diet.  Continue gabapentin   Hyperlipidemia -Continue statin  Generalized deconditioning -PT eval.  DVT prophylaxis: SCDs Code Status: Full Family Communication: None at bedside Disposition Plan: Status is: Observation The patient will require care spanning > 2 midnights and should be moved to inpatient  because: Of severity of illness   Consultants: None  Procedures: None  Antimicrobials: None   Subjective: Patient seen and examined at bedside.  Denies any chest pain, fever or vomiting.  Denies any current palpitations.  Feels weak. Objective: Vitals:   04/22/24 0137 04/22/24 0241 04/22/24 0500 04/22/24 0531  BP: (!) 118/90 121/88  (!) 122/92  Pulse: (!) 120 (!) 118  (!) 111  Resp: (!) 24 (!) 22  (!) 23  Temp: 98.1 F (36.7 C) 98 F (36.7 C)  98.2 F (36.8 C)  TempSrc: Oral Oral  Oral  SpO2: 100% 100%  100%  Weight:   78.7 kg   Height:       No intake or output data in the 24 hours ending 04/22/24 1043  Filed Weights   04/20/24 1528 04/22/24 0500  Weight: 81.6 kg 78.7 kg    Examination:  General: On room air.  No distress ENT/neck: No thyromegaly.  JVD is not elevated  respiratory: Decreased breath sounds at bases bilaterally with some crackles; no wheezing  CVS: S1-S2 heard, remains tachycardic Abdominal: Soft, nontender, slightly distended; no organomegaly, bowel sounds are heard Extremities: Trace lower extremity edema; no cyanosis  CNS: Awake and alert.  Slow to respond.  Poor historian.  No focal neurologic deficit.  Moves extremities Lymph: No obvious lymphadenopathy Skin: No obvious ecchymosis/lesions  psych: Mostly flat affect.  Not agitated currently.   Musculoskeletal: No obvious joint swelling/deformity     Data Reviewed: I have personally reviewed following labs and imaging studies  CBC: Recent Labs  Lab 04/18/24 0900 04/20/24 1701 04/20/24 1739 04/21/24 0505 04/22/24 0232  WBC 4.0 3.3*  --  19.0* 13.8*  NEUTROABS 2.7 2.6  --  17.3* 12.3*  HGB 8.4* 8.4* 8.2* 8.6* 8.1*  HCT 24.8* 25.7* 24.0* 26.6* 19.9*  MCV 95.8 101.2*  --  98.9 105.3*  PLT 84* 70*  --  73* 65*   Basic Metabolic Panel: Recent Labs  Lab 04/18/24 0900 04/20/24 1546 04/20/24 1701 04/20/24 1739 04/21/24 0505 04/22/24 0232  NA 133*  --  134* 138 135 131*  K 4.3  --   4.4 4.4 4.7 4.7  CL 102  --  104 106 105 102  CO2 25  --  21*  --  20* 20*  GLUCOSE 83  --  96 93 85 90  BUN 18  --  21 20 19 21   CREATININE 1.59*  --  1.20 1.40* 1.05 1.10  CALCIUM  9.4  --  8.6*  --  8.7* 8.9  MG  --  1.5*  --   --  1.5* 1.9   GFR: Estimated Creatinine Clearance: 70.6 mL/min (by C-G formula based on SCr of 1.1 mg/dL). Liver Function Tests: Recent Labs  Lab 04/18/24 0900 04/20/24 1701 04/21/24 0505 04/22/24 0232  AST 23 24 21 26   ALT 13 13 10 12   ALKPHOS 248* 215* 212* 230*  BILITOT 0.6 0.7 0.8 0.7  PROT 7.9 7.5 7.1 7.0  ALBUMIN 3.2* 3.3* 3.2* 3.1*   No results for input(s): LIPASE, AMYLASE in the last 168 hours. No results for input(s): AMMONIA in the last 168 hours. Coagulation Profile: Recent Labs  Lab 04/21/24 0505  INR 1.1   Cardiac Enzymes: No results for input(s): CKTOTAL, CKMB, CKMBINDEX, TROPONINI in the last 168 hours. BNP (last 3 results) Recent Labs    04/21/24 0505  PROBNP 819.0*   HbA1C: No results for input(s): HGBA1C in the last 72 hours. CBG: Recent Labs  Lab 04/21/24 0829 04/21/24 1210 04/21/24 1651 04/21/24 2122 04/22/24 0731  GLUCAP 96 101* 109* 118* 102*   Lipid Profile: No results for input(s): CHOL, HDL, LDLCALC, TRIG, CHOLHDL, LDLDIRECT in the last 72 hours. Thyroid  Function Tests: Recent Labs    04/21/24 0505  TSH 3.150   Anemia Panel: Recent Labs    04/21/24 0505  VITAMINB12 3,237*  FOLATE 7.7  FERRITIN 277  TIBC 253  IRON 223*  RETICCTPCT 1.5   Sepsis Labs: Recent Labs  Lab 04/20/24 1546  PROCALCITON 0.29    No results found for this or any previous visit (from the past 240 hours).       Radiology Studies: CT Angio Chest PE W and/or Wo Contrast Result Date: 04/20/2024 EXAM: CTA CHEST 04/20/2024 09:34:25 PM TECHNIQUE: CTA of the chest was performed after the administration of 75 mL of iohexol  (OMNIPAQUE ) 350 MG/ML injection. Multiplanar reformatted images are  provided for review. MIP images are provided for review. Automated exposure control, iterative reconstruction, and/or weight based adjustment of the mA/kV was utilized to reduce the radiation dose to as low as reasonably achievable. COMPARISON: Comparison with same day x-ray and CT dated 09/02/2023. CLINICAL HISTORY: Pulmonary embolism (PE) suspected, low to intermediate prob, positive D-dimer. Pt needs to use bathroom@2055 ; Table formatting from the original note was not included.; Notes from triage:; Pt brought over from cancer center. Stage 3 lung ca. GCSF injection. Had a HR of 147. Denies any worsening SOB or chest pain. Given 1/2 Liter of fluid. Last treatment 10/15. Power port accessed. FINDINGS: PULMONARY ARTERIES: Pulmonary arteries are adequately opacified for evaluation. No acute pulmonary embolus. Main pulmonary artery is normal in caliber. MEDIASTINUM: The heart and pericardium demonstrate no acute abnormality. There is no  acute abnormality of the thoracic aorta. Debris in the trachea. LYMPH NODES: Calcified mediastinal lymph nodes. No hilar or axillary lymphadenopathy. LUNGS AND PLEURA: Emphysema. Cavitary lesion in the left upper lobe on series 12 image 66 is similar in size measuring 2.2 cm with increased gaseous component. There is increased air component. Extensive post-radiation change about the right hilum is similar to prior. Solid and sub-solid right upper lobe nodule on series 10 image 114 demonstrates an increased solid component compared to 09/02/2023 now measuring 7 mm. The overall extent of the lesion measures 13 mm (series 12 image 47 and 12/46). Similar left upper lobe nodules. For example 5 mm subpleural nodule on series 12 image 80. No evidence of pleural effusion or pneumothorax. UPPER ABDOMEN: Limited images of the upper abdomen demonstrate nonobstructing left nephrolithiasis. SOFT TISSUES AND BONES: Chest wall port-a-cath. No acute bone or soft tissue abnormality. IMPRESSION: 1. No  pulmonary embolism. 2. Similar size of the left upper lobe cavitary lesion with increased gaseous component. 3. Right upper lobe part-solid nodule with increased solid component, solid portion 7 mm and overall extent 13 mm, concerning for um malignancy. 4. Additional small left upper lobe nodules, including a 5 mm subpleural nodule. Electronically signed by: Norman Gatlin MD 04/20/2024 10:21 PM EDT RP Workstation: [id number]   DG Chest 2 View Result Date: 04/20/2024 CLINICAL DATA:  Shortness of breath.  History of lung cancer. EXAM: CHEST - 2 VIEW COMPARISON:  Chest x-ray 01/04/2023 and chest CT 10/29/2023 FINDINGS: Extensive radiation changes noted in the right lower lobe. Knee small cavitary lesion in the left mid lung appears more cavitary when compared to the prior chest CT but indeterminate. No pulmonary edema or pleural effusions. Underlying emphysematous changes again noted. The right IJ power port is stable. IMPRESSION: 1. Extensive radiation changes in the right lower lobe. 2. Small cavitary lesion in the left mid lung appears more cavitary when compared to the prior chest CT but indeterminate. 3. No acute pulmonary findings. Electronically Signed   By: MYRTIS Stammer M.D.   On: 04/20/2024 17:33        Scheduled Meds:  atorvastatin   40 mg Oral Daily   Chlorhexidine  Gluconate Cloth  6 each Topical Daily   gabapentin   300 mg Oral QHS   Influenza vac split trivalent PF  0.5 mL Intramuscular Tomorrow-1000   insulin  aspart  0-6 Units Subcutaneous TID WC   metoprolol  tartrate  50 mg Oral Q6H   senna-docusate  1 tablet Oral BID   sodium chloride  flush  10-40 mL Intracatheter Q12H   Continuous Infusions:        Sophie Mao, MD [facility] 04/22/2024, 10:43 AM

## 2024-04-22 NOTE — Progress Notes (Signed)
   04/22/24 0137  Assess: MEWS Score  Temp 98.1 F (36.7 C)  BP (!) 118/90  MAP (mmHg) 100  Pulse Rate (!) 120  ECG Heart Rate (!) 120  Resp (!) 24  Level of Consciousness Alert  SpO2 100 %  O2 Device Room Air  Assess: MEWS Score  MEWS Temp 0  MEWS Systolic 0  MEWS Pulse 2  MEWS RR 1  MEWS LOC 0  MEWS Score 3  MEWS Score Color Yellow  Assess: if the MEWS score is Yellow or Red  Were vital signs accurate and taken at a resting state? Yes  Does the patient meet 2 or more of the SIRS criteria? Yes  Does the patient have a confirmed or suspected source of infection? No  MEWS guidelines implemented  Yes, yellow  Treat  MEWS Interventions Considered administering scheduled or prn medications/treatments as ordered  Take Vital Signs  Increase Vital Sign Frequency  Yellow: Q2hr x1, continue Q4hrs until patient remains green for 12hrs  Escalate  MEWS: Escalate Yellow: Discuss with charge nurse and consider notifying provider and/or RRT  Notify: Charge Nurse/RN  Name of Charge Nurse/RN Notified Kristine, RN  Assess: SIRS CRITERIA  SIRS Temperature  0  SIRS Respirations  1  SIRS Pulse 1  SIRS WBC 0  SIRS Score Sum  2

## 2024-04-22 NOTE — Evaluation (Signed)
 Physical Therapy Evaluation Patient Details Name: [name] MRN: [id number] DOB: 04-Aug-1955 Today's Date: 04/22/2024  History of Present Illness  Pt is 68 yo male admitted on 04/20/24 with tachycardia.  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  He is independent at baseline.  Today, pt ambulating 500+ feet with steady gait pattern . His HR was 120-127 bpm during exam.  Pt reports feels fine and like at baseline mobility.  No further PT needs.         If plan is discharge home, recommend the following:     Can travel by private vehicle        Equipment Recommendations None recommended by PT  Recommendations for Other Services       Functional Status Assessment Patient has not had a recent decline in their functional status     Precautions / Restrictions Precautions Precautions: None      Mobility  Bed Mobility Overal bed mobility: Independent                  Transfers Overall transfer level: Needs assistance Equipment used: None Transfers: Sit to/from Stand Sit to Stand: Supervision                Ambulation/Gait Ambulation/Gait assistance: Supervision Gait Distance (Feet): 500 Feet Assistive device: None Gait Pattern/deviations: Step-through pattern Gait velocity: normal     General Gait Details: slight antalgic pattern - reports neuropathy and typically walks in shoes; steady - no LOB  Stairs            Wheelchair Mobility     Tilt Bed    Modified Rankin (Stroke Patients Only)       Balance Overall balance assessment: Independent Sitting-balance support: No upper extremity supported Sitting balance-Leahy Scale: Normal Sitting balance - Comments: donned socks at EOB   Standing  balance support: No upper extremity supported Standing balance-Leahy Scale: Good                               Pertinent Vitals/Pain Pain Assessment Pain Assessment: No/denies pain    Home Living Family/patient expects to be discharged to:: Private residence Living Arrangements: Other relatives (nephew) Available Help at Discharge: Family;Available PRN/intermittently Type of Home: House Home Access: Level entry       Home Layout: One level Home Equipment: None      Prior Function Prior Level of Function : Independent/Modified Independent             Mobility Comments: Ambulates in community; enjoys gardening and working on cars ADLs Comments: independent with adls and iadls; does not drive     Extremity/Trunk Assessment   Upper Extremity Assessment Upper Extremity Assessment: Overall WFL for tasks assessed    Lower Extremity Assessment Lower Extremity Assessment: Overall WFL for tasks assessed (reports hx of neuropathy)    Cervical / Trunk Assessment Cervical / Trunk Assessment: Kyphotic  Communication        Cognition Arousal: Alert Behavior During Therapy: WFL for tasks assessed/performed   PT - Cognitive impairments: No apparent impairments                       PT - Cognition Comments: Expressed frustration at just wanting  to know what is going on so that he can go home         Cueing       General Comments General comments (skin integrity, edema, etc.): HR 120-127 bpm during session    Exercises     Assessment/Plan    PT Assessment Patient does not need any further PT services  PT Problem List         PT Treatment Interventions      PT Goals (Current goals can be found in the Care Plan section)  Acute Rehab PT Goals Patient Stated Goal: return home PT Goal Formulation: All assessment and education complete, DC therapy    Frequency       Co-evaluation               AM-PAC PT 6 Clicks Mobility   Outcome Measure Help needed turning from your back to your side while in a flat bed without using bedrails?: None Help needed moving from lying on your back to sitting on the side of a flat bed without using bedrails?: None Help needed moving to and from a bed to a chair (including a wheelchair)?: None Help needed standing up from a chair using your arms (e.g., wheelchair or bedside chair)?: None Help needed to walk in hospital room?: None Help needed climbing 3-5 steps with a railing? : A Little 6 Click Score: 23    End of Session Equipment Utilized During Treatment: Gait belt Activity Tolerance: Patient tolerated treatment well Patient left: in bed;with call bell/phone within reach Nurse Communication: Mobility status PT Visit Diagnosis: Other abnormalities of gait and mobility (R26.89)    Time: 8887-8873 PT Time Calculation (min) (ACUTE ONLY): 14 min   Charges:   PT Evaluation $PT Eval Low Complexity: 1 Low   PT General Charges $$ ACUTE PT VISIT: 1 Visit         Benjiman, PT Acute Rehab Services [facility] [phone]   Benjiman VEAR Mulberry 04/22/2024, 11:33 AM

## 2024-04-22 NOTE — TOC Initial Note (Signed)
 Transition of Care [facility]) - Initial/Assessment Note    Patient Details  Name: [name] MRN: [id number] Date of Birth: 16-Oct-1955  [facility] CM/SW Contact:    Sonda Manuella Quill, RN Phone Number: 04/22/2024, 9:14 AM  Clinical Narrative:                 Spoke w/ pt in room; pt says he lives at home w/ his nephew; he plans to return at d/c; pt identified POC [name] (dtr) [phone]; she will provide transportation; pt verified insurance/PCP; he denied SDOH risks; pt said he does not have DME, or home oxygen ; he has HHPT/RN w/ [facility]; spoke w/ Gaetana Dadds at [facility]; she said pt is active w/ agency, and resumption of care orders needed; awaiting PT eval; IP CM is following.  Expected Discharge Plan: Home w Home Health Services Barriers to Discharge: Continued Medical Work up   Patient Goals and CMS Choice Patient states their goals for this hospitalization and ongoing recovery are:: home CMS Medicare.gov Compare Post Acute Care list provided to:: Patient   [place] ownership interest in [facility].provided to:: Patient    Expected Discharge Plan and Services   Discharge Planning Services: CM Consult   Living arrangements for the past 2 months: Single Family Home                                      Prior Living Arrangements/Services Living arrangements for the past 2 months: Single Family Home Lives with:: Relatives Patient language and need for interpreter reviewed:: Yes Do you feel safe going back to the place where you live?: Yes      Need for Family Participation in Patient Care: Yes (Comment) Care giver support system in place?: Yes (comment) Current home services: Home PT, Home RN (HHPT/RN with [facility]) Criminal Activity/Legal Involvement Pertinent to Current Situation/Hospitalization: No - Comment as needed  Activities of Daily Living   ADL Screening (condition at time of admission) Independently  performs ADLs?: Yes (appropriate for developmental age) Is the patient deaf or have difficulty hearing?: No Does the patient have difficulty seeing, even when wearing glasses/contacts?: No Does the patient have difficulty concentrating, remembering, or making decisions?: No  Permission Sought/Granted Permission sought to share information with : Case Manager Permission granted to share information with : Yes, Verbal Permission Granted  Share Information with NAME: Case Manager     Permission granted to share info w Relationship: [name] Burrs (dtr) [phone]     Emotional Assessment Appearance:: Appears stated age Attitude/Demeanor/Rapport: Gracious Affect (typically observed): Accepting Orientation: : Oriented to Self, Oriented to Place, Oriented to  Time, Oriented to Situation Alcohol  / Substance Use: Not Applicable Psych Involvement: No (comment)  Admission diagnosis:  Sinus tachycardia [R00.0] Tachycardia [R00.0] Patient Active Problem List   Diagnosis Date Noted   Lung cancer (HCC) 04/21/2024   Sinus tachycardia 04/20/2024   Warm reactive antibody (HCC) 03/23/2024   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   Anemia of chronic disease 03/22/2024   Port-A-Cath in place 02/16/2024   Primary squamous cell carcinoma of upper lobe of left lung (HCC), stage IV 02/02/2024   High serum creatinine 01/05/2023   Hypomagnesemia 01/05/2023   Malnutrition of moderate degree 01/05/2023   AKI (acute kidney injury) 01/05/2023   GERD (gastroesophageal reflux disease) 01/05/2023   Pancytopenia (HCC) 01/05/2023   Pneumothorax, left 10/18/2022   Emphysema lung (HCC)  12/25/2020   Cirrhosis of liver (HCC) 12/25/2020   Internal hemorrhoids 12/24/2020   Psoriasis 04/26/2018   Drug-induced skin rash 03/17/2017   Hypokalemia 02/17/2017   Encounter for antineoplastic immunotherapy 12/21/2016   Chronic fatigue 12/21/2016   Goals of care, counseling/discussion 10/09/2016    Encounter for antineoplastic chemotherapy 10/09/2016   Stage III squamous cell carcinoma of right lung (HCC) 10/07/2016   Encounter for smoking cessation counseling 08/10/2016   Right lower lobe lung mass 07/26/2016   Diabetic neuropathy (HCC) 05/21/2016   Tobacco use disorder 05/21/2016   Alcohol  abuse 02/12/2016   Elevated liver enzymes 02/12/2016   Right hand pain 08/13/2014   History of incisional hernia repair 06/10/2014   Osteoarthritis of hand, right 04/03/2014   ACE inhibitor-aggravated angioedema 02/02/2013   Vomiting 02/02/2013   Diarrhea 02/02/2013   Epistaxis 02/02/2013   Angioedema of lips 02/01/2013   DM2 (diabetes mellitus, type 2) (HCC) 12/29/2012   Essential hypertension 12/29/2012   HLD (hyperlipidemia) 12/29/2012   Rectal bleeding 12/29/2012   Erectile dysfunction 12/29/2012   Gout 05/17/2011   Incarcerated ventral hernia 05/11/2011   Diabetes mellitus (HCC) 05/11/2011   Hypertension 05/11/2011   PCP:  [name], MD Pharmacy:   [id number] [place], [place] - [address] AT [facility] [address] [place] [address] Phone: [phone] Fax: [phone]  [facility] - [place], [place] - [address] [place] [address] Phone: [phone] Fax: [phone]     Social Drivers of Health (SDOH) Social History: SDOH Screenings   Food Insecurity: No Food Insecurity (04/22/2024)  Housing: Low Risk  (04/22/2024)  Transportation Needs: No Transportation Needs (04/22/2024)  Utilities: Not At Risk (04/22/2024)  Alcohol  Screen: Low Risk  (02/23/2022)  Depression (PHQ2-9): Low Risk  (04/18/2024)  Recent Concern: Depression (PHQ2-9) - Medium Risk (03/01/2024)  Financial Resource Strain: Low Risk  (02/23/2022)  Physical Activity: Inactive (01/26/2021)  Social Connections: Patient Declined (04/21/2024)  Stress: No Stress Concern Present (02/23/2022)   Tobacco Use: High Risk (04/21/2024)   SDOH Interventions: Food Insecurity Interventions: Intervention Not Indicated, Inpatient TOC Housing Interventions: Intervention Not Indicated, Inpatient TOC Transportation Interventions: Intervention Not Indicated, Inpatient TOC Utilities Interventions: Intervention Not Indicated, Inpatient TOC   Readmission Risk Interventions     No data to display

## 2024-04-22 NOTE — Care Management Obs Status (Signed)
 MEDICARE OBSERVATION STATUS NOTIFICATION   Patient Details  Name: [name] MRN: [id number] Date of Birth: Jul 21, 1955   Medicare Observation Status Notification Given:  Yes    Sonda Manuella Quill, RN 04/22/2024, 9:00 AM

## 2024-04-23 DIAGNOSIS — R000 Tachycardia, unspecified: Secondary | ICD-10-CM | POA: Diagnosis not present

## 2024-04-23 LAB — BASIC METABOLIC PANEL WITH GFR
Anion gap: 10 (ref 5–15)
BUN: 24 mg/dL — ABNORMAL HIGH (ref 8–23)
CO2: 21 mmol/L — ABNORMAL LOW (ref 22–32)
Calcium: 9 mg/dL (ref 8.9–10.3)
Chloride: 99 mmol/L (ref 98–111)
Creatinine, Ser: 1.2 mg/dL (ref 0.61–1.24)
GFR, Estimated: >60 mL/min (ref >60)
Glucose, Bld: 83 mg/dL (ref 70–99)
Potassium: 4.8 mmol/L (ref 3.5–5.1)
Sodium: 130 mmol/L — ABNORMAL LOW (ref 135–145)

## 2024-04-23 LAB — CBC WITH DIFFERENTIAL/PLATELET
Abs Immature Granulocytes: 0.87 K/uL — ABNORMAL HIGH (ref 0.00–0.07)
Basophils Absolute: 0.1 K/uL (ref 0.0–0.1)
Basophils Relative: 1 %
Eosinophils Absolute: 0 K/uL (ref 0.0–0.5)
Eosinophils Relative: 1 %
HCT: 34.8 % — ABNORMAL LOW (ref 39.0–52.0)
Hemoglobin: 11 g/dL — ABNORMAL LOW (ref 13.0–17.0)
Immature Granulocytes: 15 %
Lymphocytes Relative: 10 %
Lymphs Abs: 0.6 K/uL — ABNORMAL LOW (ref 0.7–4.0)
MCH: 31.8 pg (ref 26.0–34.0)
MCHC: 31.6 g/dL (ref 30.0–36.0)
MCV: 100.6 fL — ABNORMAL HIGH (ref 80.0–100.0)
Monocytes Absolute: 0.1 K/uL (ref 0.1–1.0)
Monocytes Relative: 1 %
Neutro Abs: 4.2 K/uL (ref 1.7–7.7)
Neutrophils Relative %: 72 %
Platelets: 43 K/uL — ABNORMAL LOW (ref 150–400)
RBC: 3.46 MIL/uL — ABNORMAL LOW (ref 4.22–5.81)
RDW: 22.2 % — ABNORMAL HIGH (ref 11.5–15.5)
WBC: 5.8 K/uL (ref 4.0–10.5)
nRBC: 0 % (ref 0.0–0.2)

## 2024-04-23 LAB — TYPE AND SCREEN
Antibody Screen: POSITIVE
DAT, IgG: POSITIVE

## 2024-04-23 LAB — MAGNESIUM: Magnesium: 1.6 mg/dL — ABNORMAL LOW (ref 1.7–2.4)

## 2024-04-23 LAB — GLUCOSE, CAPILLARY: Glucose-Capillary: 95 mg/dL (ref 70–99)

## 2024-04-23 MED ORDER — METOPROLOL TARTRATE 50 MG PO TABS: 50.0000 mg | ORAL_TABLET | Freq: Three times a day (TID) | ORAL | 0 refills | Status: DC

## 2024-04-23 MED ORDER — POLYETHYLENE GLYCOL 3350 17 G PO PACK: 17.0000 g | PACK | Freq: Every day | ORAL | 0 refills | Status: AC | PRN

## 2024-04-23 MED ORDER — MAGNESIUM OXIDE -MG SUPPLEMENT 400 (240 MG) MG PO TABS: 400.0000 mg | ORAL_TABLET | Freq: Two times a day (BID) | ORAL | 0 refills | Status: AC

## 2024-04-23 MED ORDER — HEPARIN SOD (PORK) LOCK FLUSH 100 UNIT/ML IV SOLN
500.0000 [IU] | INTRAVENOUS | Status: AC | PRN
  Administered 2024-04-23: 500 [IU]

## 2024-04-23 MED ORDER — MAGNESIUM SULFATE 2 GM/50ML IV SOLN
2.0000 g | Freq: Once | INTRAVENOUS | Status: AC
  Administered 2024-04-23: 2 g via INTRAVENOUS
  Filled 2024-04-23: qty 50

## 2024-04-23 NOTE — Progress Notes (Signed)
 Reviewed discharge instructions with patient including medications and follow up appointments. Pt verbalized understanding. Brinleigh Tew, Lonell Louder, RN

## 2024-04-23 NOTE — TOC Transition Note (Addendum)
 Transition of Care [facility]) - Discharge Note   Patient Details  Name: [name] MRN: [id number] Date of Birth: 1956/05/15  [facility]) CM/SW Contact:  Bascom Service, RN Phone Number: 04/23/2024, 10:16 AM   Clinical Narrative: d/c home.Already  w/[facility] HHRN/PT-rep Lynette aware. Has own transport home. No further CM needs.     Final next level of care: Home w Home Health Services Barriers to Discharge: No Barriers Identified   Patient Goals and CMS Choice Patient states their goals for this hospitalization and ongoing recovery are:: Home CMS Medicare.gov Compare Post Acute Care list provided to:: Patient   [place] ownership interest in [facility].provided to:: Patient    Discharge Placement                       Discharge Plan and Services Additional resources added to the After Visit Summary for     Discharge Planning Services: CM Consult                                 Social Drivers of Health (SDOH) Interventions SDOH Screenings   Food Insecurity: No Food Insecurity (04/22/2024)  Housing: Low Risk  (04/22/2024)  Transportation Needs: No Transportation Needs (04/22/2024)  Utilities: Not At Risk (04/22/2024)  Alcohol  Screen: Low Risk  (02/23/2022)  Depression (PHQ2-9): Low Risk  (04/18/2024)  Recent Concern: Depression (PHQ2-9) - Medium Risk (03/01/2024)  Financial Resource Strain: Low Risk  (02/23/2022)  Physical Activity: Inactive (01/26/2021)  Social Connections: Patient Declined (04/21/2024)  Stress: No Stress Concern Present (02/23/2022)  Tobacco Use: High Risk (04/21/2024)     Readmission Risk Interventions     No data to display

## 2024-04-23 NOTE — Discharge Summary (Signed)
 Physician Discharge Summary  [name] FMW:[id number] DOB: 11-17-1955 DOA: 04/20/2024  PCP: [name], MD  Admit date: 04/20/2024 Discharge date: 04/23/2024  Admitted From: Home Disposition: Home  Recommendations for Outpatient Follow-up:  Follow up with PCP in 1 week with repeat CBC/BMP Outpatient evaluation and follow-up by cardiology Outpatient follow-up with oncology Follow up in ED if symptoms worsen or new appear   Home Health: PT/RN Equipment/Devices: None  Discharge Condition: Stable CODE STATUS: Full Diet recommendation: Heart healthy  Brief/Interim Summary: 68 year old male with history of 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 On presentation, heart rates were in the 130s to 140s with EKG showing sinus tachycardia. CTA chest showed no pulmonary embolism but showed left upper lobe cavitary lesion and right upper lobe nodule with additional small left upper lobe nodules. He was given IV fluids and started on oral metoprolol . ED provider discussed the EKG with on-call cardiology fellow and it was deemed the patient had sinus tachycardia.  During the hospitalization, tachycardia is improving.  He feels better and feels okay to go home today.  He will be discharged home today on oral metoprolol .  Discharge Diagnoses:   Sinus tachycardia - Questionable cause.  No evidence of any infection.  CT chest as above. -Tachycardia has significantly improved but still intermittently slightly tachycardic.  Feels better and feels okay to go 100.  Discharge patient home today on metoprolol  50 mg 3 times a day for now.  Patient will need to be evaluated and followed up by cardiology as an outpatient.    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  Outpatient follow-up with oncology.  No signs of bleeding.  Monitor hemoglobin and platelets intermittently.   Leukocytosis - Possibly reactive.  Improving.  Monitor   Hypomagnesemia - Replace.  Continue replacement on discharge.  Outpatient follow-up   Acute metabolic acidosis - Mild.  Monitor intermittently as an outpatient   Diabetes mellitus type II Peripheral neuropathy - Outpatient follow-up.  Carb modified diet.  Continue gabapentin    Hyperlipidemia -Continue statin   Generalized deconditioning - Will need home health PT  Discharge Instructions  Discharge Instructions     Ambulatory referral to Cardiology   Complete by: As directed    Persistent tachycardia   Diet - low sodium heart healthy   Complete by: As directed    Increase activity slowly   Complete by: As directed       Allergies as of 04/23/2024       Reactions   Ace Inhibitors Swelling, Other (See Comments)   Angioedema   Furosemide  Anaphylaxis   Aspirin  Other (See Comments)   Made my nose bleed        Medication List     STOP taking these medications    cetirizine  10 MG tablet Commonly known as: ZYRTEC    clobetasol  0.05 % topical foam Commonly known as: OLUX    hydrocortisone  25 MG suppository Commonly known as: ANUSOL -HC   methocarbamol  750 MG tablet Commonly known as: ROBAXIN    metoprolol  succinate 25 MG 24 hr tablet Commonly known as: TOPROL -XL   multivitamin with minerals Tabs tablet   naltrexone  50 MG tablet Commonly known as: DEPADE   nicotine  polacrilex 2 MG gum  Commonly known as: NICORETTE    omeprazole  20 MG capsule Commonly known as: PRILOSEC       TAKE these medications    acetaminophen  325 MG tablet Commonly known as: TYLENOL  Take 2 tablets (650 mg total) by mouth every 6 (six) hours as needed for mild pain (or  Fever >/= 101).   albuterol  108 (90 Base) MCG/ACT inhaler Commonly known as: VENTOLIN  HFA Inhale 2 puffs into the lungs every 6 (six) hours as needed for wheezing or shortness of breath.   atorvastatin  40 MG tablet Commonly known as: LIPITOR Take 1 tablet (40 mg total) by mouth daily.   coal tar 0.5 % shampoo Commonly known as: NEUTROGENA T-GEL Apply 1 application topically daily.   feeding supplement Liqd Take 237 mLs by mouth 2 (two) times daily between meals.   Fish Oil 1200 MG Caps Take 1,200 mg by mouth daily.   folic acid  1 MG tablet Commonly known as: FOLVITE  Take 1 tablet (1 mg total) by mouth daily.   gabapentin  300 MG capsule Commonly known as: NEURONTIN  Take 1 capsule (300 mg total) by mouth at bedtime.   hydroxypropyl methylcellulose / hypromellose 2.5 % ophthalmic solution Commonly known as: ISOPTO TEARS / GONIOVISC Place 1 drop into both eyes 3 (three) times daily as needed for dry eyes.   lidocaine -prilocaine  cream Commonly known as: EMLA  Apply to affected area once   magnesium  oxide 400 (240 Mg) MG tablet Commonly known as: MAG-OX Take 1 tablet (400 mg total) by mouth 2 (two) times daily. What changed: how much to take   metoprolol  tartrate 50 MG tablet Commonly known as: LOPRESSOR  Take 1 tablet (50 mg total) by mouth 3 (three) times daily.   ondansetron  8 MG tablet Commonly known as: Zofran  Take 1 tablet (8 mg total) by mouth every 8 (eight) hours as needed for nausea or vomiting. Start on the third day after carboplatin .   polyethylene glycol 17 g packet Commonly known as: MIRALAX  / GLYCOLAX  Take 17 g by mouth daily as needed for moderate constipation.   prochlorperazine  10 MG tablet Commonly known as: COMPAZINE  Take 1 tablet (10 mg total) by mouth every 6 (six) hours as needed for nausea or vomiting.   thiamine  100 MG tablet Commonly known as: Vitamin B-1 Take 100 mg by mouth daily.        Follow-up Information     [name],  MD. Schedule an appointment as soon as possible for a visit in 1 week(s).   Specialty: Family Medicine Why: with repeat bmp/mag Contact information: [address] [place] [address] [place] [address] [phone]                Allergies  Allergen Reactions   Ace Inhibitors Swelling and Other (See Comments)    Angioedema    Furosemide  Anaphylaxis   Aspirin  Other (See Comments)    Made my nose bleed    Consultations: None   Procedures/Studies: CT Angio Chest PE W and/or Wo Contrast Result Date: 04/20/2024 EXAM: CTA CHEST 04/20/2024 09:34:25 PM TECHNIQUE: CTA of the chest was performed after the administration of 75 mL of iohexol  (OMNIPAQUE ) 350 MG/ML injection. Multiplanar reformatted images are provided for review. MIP images are provided for review. Automated exposure control, iterative reconstruction, and/or weight based adjustment of the mA/kV was utilized to reduce the radiation dose to as low as reasonably achievable. COMPARISON: Comparison with same day x-ray and CT dated 09/02/2023. CLINICAL HISTORY: Pulmonary embolism (PE) suspected, low to intermediate prob, positive D-dimer.  Pt needs to use bathroom@2055 ; Table formatting from the original note was not included.; Notes from triage:; Pt brought over from cancer center. Stage 3 lung ca. GCSF injection. Had a HR of 147. Denies any worsening SOB or chest pain. Given 1/2 Liter of fluid. Last treatment 10/15. Power port accessed. FINDINGS: PULMONARY ARTERIES: Pulmonary arteries are adequately opacified for evaluation. No acute pulmonary embolus. Main pulmonary artery is normal in caliber. MEDIASTINUM: The heart and pericardium demonstrate no acute abnormality. There is no acute abnormality of the thoracic aorta. Debris in the trachea. LYMPH NODES: Calcified mediastinal lymph nodes. No hilar or axillary lymphadenopathy. LUNGS AND PLEURA: Emphysema. Cavitary lesion in the left upper lobe on series 12 image 66 is similar in size  measuring 2.2 cm with increased gaseous component. There is increased air component. Extensive post-radiation change about the right hilum is similar to prior. Solid and sub-solid right upper lobe nodule on series 10 image 114 demonstrates an increased solid component compared to 09/02/2023 now measuring 7 mm. The overall extent of the lesion measures 13 mm (series 12 image 47 and 12/46). Similar left upper lobe nodules. For example 5 mm subpleural nodule on series 12 image 80. No evidence of pleural effusion or pneumothorax. UPPER ABDOMEN: Limited images of the upper abdomen demonstrate nonobstructing left nephrolithiasis. SOFT TISSUES AND BONES: Chest wall port-a-cath. No acute bone or soft tissue abnormality. IMPRESSION: 1. No pulmonary embolism. 2. Similar size of the left upper lobe cavitary lesion with increased gaseous component. 3. Right upper lobe part-solid nodule with increased solid component, solid portion 7 mm and overall extent 13 mm, concerning for um malignancy. 4. Additional small left upper lobe nodules, including a 5 mm subpleural nodule. Electronically signed by: Norman Gatlin MD 04/20/2024 10:21 PM EDT RP Workstation: [id number]   DG Chest 2 View Result Date: 04/20/2024 CLINICAL DATA:  Shortness of breath.  History of lung cancer. EXAM: CHEST - 2 VIEW COMPARISON:  Chest x-ray 01/04/2023 and chest CT 10/29/2023 FINDINGS: Extensive radiation changes noted in the right lower lobe. Knee small cavitary lesion in the left mid lung appears more cavitary when compared to the prior chest CT but indeterminate. No pulmonary edema or pleural effusions. Underlying emphysematous changes again noted. The right IJ power port is stable. IMPRESSION: 1. Extensive radiation changes in the right lower lobe. 2. Small cavitary lesion in the left mid lung appears more cavitary when compared to the prior chest CT but indeterminate. 3. No acute pulmonary findings. Electronically Signed   By: MYRTIS Stammer M.D.    On: 04/20/2024 17:33      Subjective: Patient seen and examined at bedside.  Feels okay to go home today.  Denies any chest pain or palpitations.  No shortness of breath reported.  Discharge Exam: Vitals:   04/22/24 2320 04/23/24 0508  BP: 116/81 120/79  Pulse: (!) 117   Resp: 20 16  Temp: 98.4 F (36.9 C) 98.3 F (36.8 C)  SpO2: 99% 98%    General: Pt is alert, awake, not in acute distress Cardiovascular: Mild intermittent tachycardia present; S1/S2 + Respiratory: bilateral decreased breath sounds at bases Abdominal: Soft, NT, ND, bowel sounds + Extremities: no edema, no cyanosis    The results of significant diagnostics from this hospitalization (including imaging, microbiology, ancillary and laboratory) are listed below for reference.     Microbiology: No results found for this or any previous visit (from the past 240 hours).   Labs: BNP (last 3 results) No results for input(s): BNP  in the last 8760 hours. Basic Metabolic Panel: Recent Labs  Lab 04/18/24 0900 04/20/24 1546 04/20/24 1701 04/20/24 1739 04/21/24 0505 04/22/24 0232 04/23/24 0249  NA 133*  --  134* 138 135 131* 130*  K 4.3  --  4.4 4.4 4.7 4.7 4.8  CL 102  --  104 106 105 102 99  CO2 25  --  21*  --  20* 20* 21*  GLUCOSE 83  --  96 93 85 90 83  BUN 18  --  21 20 19 21  24*  CREATININE 1.59*  --  1.20 1.40* 1.05 1.10 1.20  CALCIUM  9.4  --  8.6*  --  8.7* 8.9 9.0  MG  --  1.5*  --   --  1.5* 1.9 1.6*   Liver Function Tests: Recent Labs  Lab 04/18/24 0900 04/20/24 1701 04/21/24 0505 04/22/24 0232  AST 23 24 21 26   ALT 13 13 10 12   ALKPHOS 248* 215* 212* 230*  BILITOT 0.6 0.7 0.8 0.7  PROT 7.9 7.5 7.1 7.0  ALBUMIN 3.2* 3.3* 3.2* 3.1*   No results for input(s): LIPASE, AMYLASE in the last 168 hours. No results for input(s): AMMONIA in the last 168 hours. CBC: Recent Labs  Lab 04/18/24 0900 04/20/24 1701 04/20/24 1739 04/21/24 0505 04/22/24 0232 04/23/24 0249  WBC 4.0  3.3*  --  19.0* 13.8* 5.8  NEUTROABS 2.7 2.6  --  17.3* 12.3* 4.2  HGB 8.4* 8.4* 8.2* 8.6* 8.1* 11.0*  HCT 24.8* 25.7* 24.0* 26.6* 19.9* 34.8*  MCV 95.8 101.2*  --  98.9 105.3* 100.6*  PLT 84* 70*  --  73* 65* 43*   Cardiac Enzymes: No results for input(s): CKTOTAL, CKMB, CKMBINDEX, TROPONINI in the last 168 hours. BNP: Invalid input(s): POCBNP CBG: Recent Labs  Lab 04/22/24 0731 04/22/24 1136 04/22/24 1630 04/22/24 2056 04/23/24 0736  GLUCAP 102* 121* 125* 130* 95   D-Dimer Recent Labs    04/20/24 1701  DDIMER 2.21*   Hgb A1c No results for input(s): HGBA1C in the last 72 hours. Lipid Profile No results for input(s): CHOL, HDL, LDLCALC, TRIG, CHOLHDL, LDLDIRECT in the last 72 hours. Thyroid  function studies Recent Labs    04/21/24 0505  TSH 3.150   Anemia work up Recent Labs    04/21/24 0505  VITAMINB12 3,237*  FOLATE 7.7  FERRITIN 277  TIBC 253  IRON 223*  RETICCTPCT 1.5   Urinalysis    Component Value Date/Time   COLORURINE YELLOW 04/20/2024 1754   APPEARANCEUR CLEAR 04/20/2024 1754   LABSPEC 1.009 04/20/2024 1754   PHURINE 5.0 04/20/2024 1754   GLUCOSEU NEGATIVE 04/20/2024 1754   HGBUR NEGATIVE 04/20/2024 1754   BILIRUBINUR NEGATIVE 04/20/2024 1754   BILIRUBINUR negative 07/10/2021 1612   BILIRUBINUR neg 03/13/2014 1303   KETONESUR NEGATIVE 04/20/2024 1754   PROTEINUR NEGATIVE 04/20/2024 1754   UROBILINOGEN 1.0 07/10/2021 1612   UROBILINOGEN 0.2 07/18/2020 1432   NITRITE NEGATIVE 04/20/2024 1754   LEUKOCYTESUR NEGATIVE 04/20/2024 1754   Sepsis Labs Recent Labs  Lab 04/20/24 1701 04/21/24 0505 04/22/24 0232 04/23/24 0249  WBC 3.3* 19.0* 13.8* 5.8   Microbiology No results found for this or any previous visit (from the past 240 hours).   Time coordinating discharge: 35 minutes  SIGNED:   Sophie Mao, MD  [facility] 04/23/2024, 8:19 AM

## 2024-04-30 DIAGNOSIS — J439 Emphysema, unspecified: Secondary | ICD-10-CM | POA: Diagnosis not present

## 2024-05-02 ENCOUNTER — Ambulatory Visit (HOSPITAL_COMMUNITY)
Admission: RE | Admit: 2024-05-02 | Discharge: 2024-05-02 | Disposition: A | Discharge status: Home / Self Care | Source: Ambulatory Visit | Attending: Internal Medicine | Admitting: Internal Medicine

## 2024-05-02 DIAGNOSIS — J439 Emphysema, unspecified: Secondary | ICD-10-CM | POA: Diagnosis not present

## 2024-05-02 DIAGNOSIS — C3490 Malignant neoplasm of unspecified part of unspecified bronchus or lung: Secondary | ICD-10-CM | POA: Diagnosis not present

## 2024-05-02 DIAGNOSIS — N200 Calculus of kidney: Secondary | ICD-10-CM | POA: Diagnosis not present

## 2024-05-02 DIAGNOSIS — I700 Atherosclerosis of aorta: Secondary | ICD-10-CM | POA: Diagnosis not present

## 2024-05-02 NOTE — Progress Notes (Signed)
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    Expected Date:   08/06/2024    Expiration Date:   08/06/2025   CMP (Cancer Center only)    Standing Status:   Future    Expected Date:   08/06/2024    Expiration Date:   08/06/2025   CBC with Differential (Cancer Center Only)    Standing Status:   Future    Expected Date:   08/27/2024    Expiration Date:   08/27/2025   CMP (Cancer Center only)    Standing Status:   Future    Expected Date:   08/27/2024    Expiration Date:   08/27/2025   T4  Standing Status:   Future    Expected Date:   08/27/2024    Expiration Date:   08/27/2025   TSH    Standing Status:   Future    Expected Date:   08/27/2024    Expiration Date:   08/27/2025   CBC with Differential (Cancer Center Only)    Standing Status:   Future    Expected Date:   09/17/2024    Expiration Date:   09/17/2025   CMP (Cancer Center only)    Standing Status:   Future    Expected Date:   09/17/2024    Expiration Date:   09/17/2025   T4    Standing Status:   Future    Expected Date:   09/17/2024    Expiration Date:   09/17/2025   TSH    Standing Status:   Future    Expected Date:   09/17/2024    Expiration Date:   09/17/2025   CBC with Differential (Cancer Center Only)    Standing Status:    Future    Expected Date:   10/08/2024    Expiration Date:   10/08/2025   CMP (Cancer Center only)    Standing Status:   Future    Expected Date:   10/08/2024    Expiration Date:   10/08/2025   T4    Standing Status:   Future    Expected Date:   10/08/2024    Expiration Date:   10/08/2025   TSH    Standing Status:   Future    Expected Date:   10/08/2024    Expiration Date:   10/08/2025   CBC with Differential (Cancer Center Only)    Standing Status:   Future    Expected Date:   10/29/2024    Expiration Date:   10/29/2025   CMP (Cancer Center only)    Standing Status:   Future    Expected Date:   10/29/2024    Expiration Date:   10/29/2025   T4    Standing Status:   Future    Expected Date:   10/29/2024    Expiration Date:   10/29/2025   TSH    Standing Status:   Future    Expected Date:   10/29/2024    Expiration Date:   10/29/2025   CBC with Differential (Cancer Center Only)    Standing Status:   Future    Expected Date:   11/19/2024    Expiration Date:   11/19/2025   CMP (Cancer Center only)    Standing Status:   Future    Expected Date:   11/19/2024    Expiration Date:   11/19/2025   T4    Standing Status:   Future    Expected Date:   11/19/2024    Expiration Date:   11/19/2025   TSH    Standing Status:   Future    Expected Date:   11/19/2024    Expiration Date:   11/19/2025   CBC with Differential (Cancer Center Only)    Standing Status:   Future    Expected Date:   12/10/2024    Expiration Date:   12/10/2025   CMP (Cancer Center only)    Standing Status:   Future    Expected Date:   12/10/2024    Expiration Date:   12/10/2025   T4    Standing Status:   Future    Expected Date:   12/10/2024    Expiration Date:  12/10/2025   TSH    Standing Status:   Future    Expected Date:   12/10/2024    Expiration Date:   12/10/2025   CBC with Differential (Cancer Center Only)    Standing Status:   Future    Expected Date:   12/31/2024    Expiration Date:   12/31/2025   CMP (Cancer Center only)    Standing  Status:   Future    Expected Date:   12/31/2024    Expiration Date:   12/31/2025   T4    Standing Status:   Future    Expected Date:   12/31/2024    Expiration Date:   12/31/2025   TSH    Standing Status:   Future    Expected Date:   12/31/2024    Expiration Date:   12/31/2025   CBC with Differential (Cancer Center Only)    Standing Status:   Future    Expected Date:   01/21/2025    Expiration Date:   01/21/2026   CMP (Cancer Center only)    Standing Status:   Future    Expected Date:   01/21/2025    Expiration Date:   01/21/2026   T4    Standing Status:   Future    Expected Date:   01/21/2025    Expiration Date:   01/21/2026   TSH    Standing Status:   Future    Expected Date:   01/21/2025    Expiration Date:   01/21/2026     Yoshio Seliga L Sandar Krinke, PA-C 05/07/24  ADDENDUM: Hematology/Oncology Attending: I had a face-to-face encounter with the patient today.  I reviewed his record, lab, scan and recommended his care plan.  This is a very pleasant 68 years old African-American male with metastatic non-small cell lung cancer, squamous cell carcinoma that was initially diagnosed as stage IIIa in March 2018 status post course of concurrent chemoradiation followed by 1 year treatment of consolidation treatment with immunotherapy with durvalumab .  The patient had evidence for disease recurrence and he started treatment with systemic chemotherapy with carboplatin , paclitaxel  and Libtayo  (Cempilimab) every 3 weeks for 4 cycles in August 2025. He had repeat CT scan of the chest, abdomen and pelvis performed recently.  I personally independently reviewed the scan and discussed the result with the patient and his daughter today.  His scan showed stable disease with interval development of multiple small sclerotic foci throughout the skeleton consistent with treated osseous metastasis. I recommended for the patient to move to the maintenance phase of this treatment with Libtayo  (Cempilimab) 350 mg IV  every 3 weeks.  We will delay the start of his treatment by 1 week because of the low platelets count on the lab work today. He is expected to start the first cycle of his maintenance therapy next week. The patient will come back for follow-up visit in 4 weeks for evaluation before starting cycle #2. He was advised to call immediately if he has any other concerning symptoms in the interval. Disclaimer: This note was dictated with voice recognition software. Similar sounding words can inadvertently be transcribed and may be missed upon review. Sherrod MARLA Sherrod, MD

## 2024-05-03 DIAGNOSIS — J439 Emphysema, unspecified: Secondary | ICD-10-CM | POA: Diagnosis not present

## 2024-05-07 ENCOUNTER — Telehealth: Payer: Self-pay

## 2024-05-07 ENCOUNTER — Inpatient Hospital Stay: Attending: Physician Assistant

## 2024-05-07 ENCOUNTER — Ambulatory Visit: Attending: Cardiology | Admitting: Cardiology

## 2024-05-07 ENCOUNTER — Other Ambulatory Visit: Payer: Self-pay | Admitting: Physician Assistant

## 2024-05-07 ENCOUNTER — Inpatient Hospital Stay: Admitting: Physician Assistant

## 2024-05-07 ENCOUNTER — Encounter: Payer: Self-pay | Admitting: Cardiology

## 2024-05-07 VITALS — BP 99/74 | HR 112 | Temp 98.0°F | Resp 18 | Ht 74.0 in | Wt 180.7 lb

## 2024-05-07 VITALS — BP 104/68 | HR 111 | Ht 74.0 in | Wt 179.8 lb

## 2024-05-07 DIAGNOSIS — Z79620 Long term (current) use of immunosuppressive biologic: Secondary | ICD-10-CM | POA: Diagnosis not present

## 2024-05-07 DIAGNOSIS — I10 Essential (primary) hypertension: Secondary | ICD-10-CM | POA: Diagnosis not present

## 2024-05-07 DIAGNOSIS — C3412 Malignant neoplasm of upper lobe, left bronchus or lung: Secondary | ICD-10-CM

## 2024-05-07 DIAGNOSIS — C7951 Secondary malignant neoplasm of bone: Secondary | ICD-10-CM | POA: Diagnosis not present

## 2024-05-07 DIAGNOSIS — Z5112 Encounter for antineoplastic immunotherapy: Secondary | ICD-10-CM | POA: Insufficient documentation

## 2024-05-07 DIAGNOSIS — E782 Mixed hyperlipidemia: Secondary | ICD-10-CM | POA: Diagnosis not present

## 2024-05-07 DIAGNOSIS — R000 Tachycardia, unspecified: Secondary | ICD-10-CM | POA: Diagnosis not present

## 2024-05-07 DIAGNOSIS — Z87891 Personal history of nicotine dependence: Secondary | ICD-10-CM | POA: Diagnosis not present

## 2024-05-07 DIAGNOSIS — C3431 Malignant neoplasm of lower lobe, right bronchus or lung: Secondary | ICD-10-CM | POA: Diagnosis not present

## 2024-05-07 DIAGNOSIS — I427 Cardiomyopathy due to drug and external agent: Secondary | ICD-10-CM | POA: Diagnosis not present

## 2024-05-07 LAB — CBC WITH DIFFERENTIAL (CANCER CENTER ONLY)
Abs Immature Granulocytes: 0.04 K/uL (ref 0.00–0.07)
Basophils Absolute: 0.1 K/uL (ref 0.0–0.1)
Basophils Relative: 1 %
Eosinophils Absolute: 0 K/uL (ref 0.0–0.5)
Eosinophils Relative: 0 %
HCT: 24.6 % — ABNORMAL LOW (ref 39.0–52.0)
Hemoglobin: 9 g/dL — ABNORMAL LOW (ref 13.0–17.0)
Immature Granulocytes: 1 %
Lymphocytes Relative: 31 %
Lymphs Abs: 2.4 K/uL (ref 0.7–4.0)
MCH: 37.7 pg — ABNORMAL HIGH (ref 26.0–34.0)
MCHC: 36.6 g/dL — ABNORMAL HIGH (ref 30.0–36.0)
MCV: 102.9 fL — ABNORMAL HIGH (ref 80.0–100.0)
Monocytes Absolute: 0.6 K/uL (ref 0.1–1.0)
Monocytes Relative: 7 %
Neutro Abs: 4.8 K/uL (ref 1.7–7.7)
Neutrophils Relative %: 60 %
Platelet Count: 65 K/uL — ABNORMAL LOW (ref 150–400)
RBC: 2.39 MIL/uL — ABNORMAL LOW (ref 4.22–5.81)
RDW: 21.1 % — ABNORMAL HIGH (ref 11.5–15.5)
Smear Review: NORMAL
WBC Count: 7.9 K/uL (ref 4.0–10.5)
nRBC: 0 % (ref 0.0–0.2)

## 2024-05-07 LAB — CMP (CANCER CENTER ONLY)
ALT: 10 U/L (ref 0–44)
AST: 15 U/L (ref 15–41)
Albumin: 3.4 g/dL — ABNORMAL LOW (ref 3.5–5.0)
Alkaline Phosphatase: 234 U/L — ABNORMAL HIGH (ref 38–126)
Anion gap: 6 (ref 5–15)
BUN: 20 mg/dL (ref 8–23)
CO2: 24 mmol/L (ref 22–32)
Calcium: 8.9 mg/dL (ref 8.9–10.3)
Chloride: 105 mmol/L (ref 98–111)
Creatinine: 1.67 mg/dL — ABNORMAL HIGH (ref 0.61–1.24)
GFR, Estimated: 44 mL/min — ABNORMAL LOW (ref >60)
Glucose, Bld: 108 mg/dL — ABNORMAL HIGH (ref 70–99)
Potassium: 4.3 mmol/L (ref 3.5–5.1)
Sodium: 135 mmol/L (ref 135–145)
Total Bilirubin: 0.4 mg/dL (ref 0.0–1.2)
Total Protein: 8.1 g/dL (ref 6.5–8.1)

## 2024-05-07 LAB — MAGNESIUM: Magnesium: 1.5 mg/dL — ABNORMAL LOW (ref 1.7–2.4)

## 2024-05-07 LAB — SAMPLE TO BLOOD BANK

## 2024-05-07 NOTE — Telephone Encounter (Signed)
 Tried to reach patients daughter in regards to patients lab results from today.  Per Cassie, PA, patient creatinine is slightly elevated and encourage patient to hydrate better at home. Also, reminded her that the patients infusion and injection appt will be canceled for this week.  Asked for a return call with any questions.

## 2024-05-07 NOTE — Progress Notes (Signed)
 Cardiology Office Note:  .   Date:  05/07/2024  ID:  [name], DOB September 19, 1955, MRN [id number] PCP: [name], MD  [facility] Providers Cardiologist:  None {  History of Present Illness: .   [name] is a 68 y.o. male with history of metastatic SCC on chemo, chronic anemia and thrombocytopenia, hypertension, history of alcohol  abuse, pancytopenia, HLD, type 2 diabetes.     Bradycardia 01/2023 reported heart rates in the 30s prior to cataract surgery. In the ED normal HR, resolved.   Lung cancer Metastatic non-small cell lung cancer initially diagnosed as stage IIIA (T2a,N2, M0) lung cancer probably squamous cell carcinoma. Metastatic disease with left-sided upper lobe cavitary mass as well as metastatic disease to the L4 vertebral body diagnosed in May 2025.    Social history  Half pack per day smoker History of alcohol  abuse, he has been sober for about 4 months now.     Patient without any significant cardiac history, seen only on 1 occasion for reported bradycardia that was self resolving.  No evidence of high AV grade blocks.  He was just recently seen 04/2024 at oncology office reportedly in SVT, at the ED he was in sinus tach (reviewed by cardio), but admitted. No signs of infections and no PE.  However he did have leukocytosis, low magnesium  (persistently an issues with critically low results) and metabolic acidosis.  He was discharged on metoprolol  50 mg 3 times daily and referred back to cardiology.  Today patient is accompanied with his daughter [name].  He lives independently but she does care for him.  He has a home health nurse that comes out and reportedly had told him to stop multiple medications.  He is unclear of exactly what he is taking.  They report that he has been overall asymptomatic without any acute complaints and has not noticed any palpitations.  Denies any peripheral edema, orthopnea, chest pain.  On occasion he will have some  dizziness when bending over but when standing and walking denied any complaints.  However, they have been noting worsening shortness of breath going on for the last 3 weeks.  His exercise capacity seems to have diminished and now having to use a wheelchair at times.  ROS: Denies: Chest pain, shortness of breath, orthopnea, peripheral edema, palpitations, decreased exercise intolerance, fatigue, lightheadedness.   Studies Reviewed: [name]    EKG Interpretation Date/Time:  Monday May 07 2024 10:20:46 EST Ventricular Rate:  111 PR Interval:  176 QRS Duration:  94 QT Interval:  352 QTC Calculation: 478 R Axis:   23  Text Interpretation: Sinus tachycardia Nonspecific ST and T wave abnormality When compared with ECG of 20-Apr-2024 22:24, PREVIOUS ECG IS PRESENT No significant change since last tracing Confirmed by Darryle Currier [phone]) on 05/07/2024 10:25:20 AM    Risk Assessment/Calculations:             Physical Exam:   VS:  BP 104/68   Pulse (!) 111   Ht 6' 2 (1.88 m)   Wt 179 lb 12.8 oz (81.6 kg)   SpO2 98%   BMI 23.08 kg/m    Wt Readings from Last 3 Encounters:  05/07/24 179 lb 12.8 oz (81.6 kg)  04/23/24 174 lb 2.6 oz (79 kg)  04/18/24 180 lb (81.6 kg)    GEN: Well nourished, well developed in no acute distress NECK: No JVD; No carotid bruits CARDIAC: Tachycardic, no murmurs, rubs, gallops RESPIRATORY:  Clear to auscultation without rales, wheezing or rhonchi  ABDOMEN: Soft, non-tender, non-distended EXTREMITIES:  No edema; No deformity   ASSESSMENT AND PLAN: .    Sinus tachycardia Suspect tachycardia likely related to his electrolyte derangements and significantly low/persistently low magnesium .  Chemotherapy could be influencing as well.  Reviewed his medications, some of these may contribute to arrhythmias but generally paclitaxel  would cause bradycardia and not tachycardia.  Given overall palliative care approach not likely to benefit from heart monitor given he is  asymptomatic and not likely going to change long-term management.  In the future could consider but discussed with family and they agree on holding off. He was discharged on Lopressor  50 mg 3 times daily, unclear whether or not he is taking this.  Daughter will go home and reconcile medications and let us  know exactly what he is taking.  Likely will just place him on Toprol -XL to help consolidate medications and to prevent severe tachycardia episodes but seems like a more chronic issue with heart rates around 110 which is reasonable. TSH normal 2 weeks ago although was mildly abnormal about a month ago. Continue to aggressively supplement magnesium , currently supposed to be on 400 mg twice daily but he thinks he is not taking this.  Defer management of his magnesium  to oncology.   SOB Non-small cell lung cancer He is on palliative chemotherapy with reduced dose carboplatin , paclitaxel , and Libtayo .  In review of these medications, some of these may contribute to some of arrhythmias and checkpoint inhibitor can rarely cause myocarditis.  He looks euvolemic, suspect symptoms of SOB are more related to lung cancer.   Will obtain echocardiogram and BNP.  He has been reporting shortness of breath that likely related to lung cancer but will rule out chemo-induced cardiomyopathy. May consider low-dose diuretic if BNP is elevated.  Hypertension 104/68 today.  He is not on any BP meds besides possibly metoprolol .  Hyperlipidemia Not sure if he's taking atorvastatin  40 mg.  Daughter will reconcile medications.  History of alcohol  abuse Been sober for about 4 months now.  Congratulated him and encouraged continued cessation.  Diabetes Very well-controlled A1c within normal range.    Dispo: 36-month follow-up to establish care with cardiology.  Will defer to cardiologist at that time about routine echocardiograms while on chemotherapy.  Follow-up on shortness of breath  Signed, Thom LITTIE Sluder, PA-C

## 2024-05-07 NOTE — Patient Instructions (Signed)
 Medication Instructions:  Your physician has recommended you make the following change in your medication:   *If you need a refill on your cardiac medications before your next appointment, please call your pharmacy*  Lab Work: BNP If you have labs (blood work) drawn today and your tests are completely normal, you will receive your results only by: MyChart Message (if you have MyChart) OR A paper copy in the mail If you have any lab test that is abnormal or we need to change your treatment, we will call you to review the results.  Testing/Procedures: Echocardiogram  Follow-Up: At [facility], you and your health needs are our priority.  As part of our continuing mission to provide you with exceptional heart care, our providers are all part of one team.  This team includes your primary Cardiologist (physician) and Advanced Practice Providers or APPs (Physician Assistants and Nurse Practitioners) who all work together to provide you with the care you need, when you need it.  Your next appointment:   3 month(s)  Provider:   Dr. Francyne We recommend signing up for the patient portal called MyChart.  Sign up information is provided on this After Visit Summary.  MyChart is used to connect with patients for Virtual Visits (Telemedicine).  Patients are able to view lab/test results, encounter notes, upcoming appointments, etc.  Non-urgent messages can be sent to your provider as well.   To learn more about what you can do with MyChart, go to [email or web address].   Other Instructions Please call with current medications.

## 2024-05-08 ENCOUNTER — Ambulatory Visit: Payer: Self-pay | Admitting: Cardiology

## 2024-05-08 ENCOUNTER — Ambulatory Visit: Payer: Self-pay

## 2024-05-08 LAB — BRAIN NATRIURETIC PEPTIDE: BNP: 338 pg/mL — ABNORMAL HIGH (ref 0.0–100.0)

## 2024-05-08 NOTE — Telephone Encounter (Signed)
-----   Message from Thom LITTIE Sluder sent at 05/08/2024  3:52 PM EST ----- BNP not really impressive.  I ordered this to kind of discern if he had signs of heart failure versus shortness of breath from his chemotherapy which I suspect to be the primary cause.  Given mild  elevation and significant issues with his electrolyte abnormalities would not start him on diuretics at this time.  Can you please be sure that daughter is reached and medications are reconciled? ----- Message ----- From: Interface, [facility] Lab Results In Sent: 05/08/2024   3:36 PM EST To: Thom LITTIE Sluder, PA-C

## 2024-05-08 NOTE — Telephone Encounter (Signed)
 Patient's daughter is returning call.

## 2024-05-08 NOTE — Telephone Encounter (Signed)
 [name] daughter Ms. Alfredo notified of lab results.  Medication list reconciled.

## 2024-05-08 NOTE — Addendum Note (Signed)
 Addended by: Caitlyne Ingham on: 05/08/2024 05:05 PM   Modules accepted: Orders

## 2024-05-08 NOTE — Telephone Encounter (Signed)
 Left the patient a message  to call the office back for his lab results.  The patient also needs to sign a consent form so that the office can discuss information with his daughter.

## 2024-05-08 NOTE — Telephone Encounter (Signed)
 [name]

## 2024-05-09 ENCOUNTER — Other Ambulatory Visit: Payer: Self-pay | Admitting: Family Medicine

## 2024-05-09 DIAGNOSIS — E440 Moderate protein-calorie malnutrition: Secondary | ICD-10-CM | POA: Diagnosis not present

## 2024-05-09 DIAGNOSIS — H31011 Macula scars of posterior pole (postinflammatory) (post-traumatic), right eye: Secondary | ICD-10-CM | POA: Diagnosis not present

## 2024-05-09 DIAGNOSIS — H35373 Puckering of macula, bilateral: Secondary | ICD-10-CM | POA: Diagnosis not present

## 2024-05-09 DIAGNOSIS — H53411 Scotoma involving central area, right eye: Secondary | ICD-10-CM | POA: Diagnosis not present

## 2024-05-09 DIAGNOSIS — C3491 Malignant neoplasm of unspecified part of right bronchus or lung: Secondary | ICD-10-CM | POA: Diagnosis not present

## 2024-05-09 DIAGNOSIS — H4051X3 Glaucoma secondary to other eye disorders, right eye, severe stage: Secondary | ICD-10-CM | POA: Diagnosis not present

## 2024-05-09 DIAGNOSIS — F419 Anxiety disorder, unspecified: Secondary | ICD-10-CM | POA: Diagnosis not present

## 2024-05-09 DIAGNOSIS — E1159 Type 2 diabetes mellitus with other circulatory complications: Secondary | ICD-10-CM | POA: Diagnosis not present

## 2024-05-09 DIAGNOSIS — J45909 Unspecified asthma, uncomplicated: Secondary | ICD-10-CM | POA: Diagnosis not present

## 2024-05-09 DIAGNOSIS — Z8669 Personal history of other diseases of the nervous system and sense organs: Secondary | ICD-10-CM | POA: Diagnosis not present

## 2024-05-09 DIAGNOSIS — I152 Hypertension secondary to endocrine disorders: Secondary | ICD-10-CM | POA: Diagnosis not present

## 2024-05-09 DIAGNOSIS — C3492 Malignant neoplasm of unspecified part of left bronchus or lung: Secondary | ICD-10-CM | POA: Diagnosis not present

## 2024-05-09 DIAGNOSIS — C7951 Secondary malignant neoplasm of bone: Secondary | ICD-10-CM | POA: Diagnosis not present

## 2024-05-09 DIAGNOSIS — J439 Emphysema, unspecified: Secondary | ICD-10-CM | POA: Diagnosis not present

## 2024-05-10 ENCOUNTER — Inpatient Hospital Stay

## 2024-05-12 ENCOUNTER — Inpatient Hospital Stay

## 2024-05-14 ENCOUNTER — Other Ambulatory Visit: Payer: Self-pay | Admitting: Internal Medicine

## 2024-05-14 ENCOUNTER — Inpatient Hospital Stay

## 2024-05-14 DIAGNOSIS — C3412 Malignant neoplasm of upper lobe, left bronchus or lung: Secondary | ICD-10-CM

## 2024-05-14 DIAGNOSIS — Z5112 Encounter for antineoplastic immunotherapy: Secondary | ICD-10-CM | POA: Diagnosis not present

## 2024-05-14 LAB — CMP (CANCER CENTER ONLY)
ALT: 7 U/L (ref 0–44)
AST: 15 U/L (ref 15–41)
Albumin: 3.2 g/dL — ABNORMAL LOW (ref 3.5–5.0)
Alkaline Phosphatase: 193 U/L — ABNORMAL HIGH (ref 38–126)
Anion gap: 5 (ref 5–15)
BUN: 21 mg/dL (ref 8–23)
CO2: 24 mmol/L (ref 22–32)
Calcium: 8.7 mg/dL — ABNORMAL LOW (ref 8.9–10.3)
Chloride: 103 mmol/L (ref 98–111)
Creatinine: 1.51 mg/dL — ABNORMAL HIGH (ref 0.61–1.24)
GFR, Estimated: 50 mL/min — ABNORMAL LOW (ref >60)
Glucose, Bld: 85 mg/dL (ref 70–99)
Potassium: 4.5 mmol/L (ref 3.5–5.1)
Sodium: 132 mmol/L — ABNORMAL LOW (ref 135–145)
Total Bilirubin: 0.4 mg/dL (ref 0.0–1.2)
Total Protein: 7.5 g/dL (ref 6.5–8.1)

## 2024-05-14 LAB — CBC WITH DIFFERENTIAL (CANCER CENTER ONLY)
Abs Immature Granulocytes: 0.01 K/uL (ref 0.00–0.07)
Basophils Absolute: 0 K/uL (ref 0.0–0.1)
Basophils Relative: 1 %
Eosinophils Absolute: 0.1 K/uL (ref 0.0–0.5)
Eosinophils Relative: 3 %
HCT: 24.3 % — ABNORMAL LOW (ref 39.0–52.0)
Hemoglobin: 7.9 g/dL — ABNORMAL LOW (ref 13.0–17.0)
Immature Granulocytes: 0 %
Lymphocytes Relative: 33 %
Lymphs Abs: 1.1 K/uL (ref 0.7–4.0)
MCH: 32.8 pg (ref 26.0–34.0)
MCHC: 32.5 g/dL (ref 30.0–36.0)
MCV: 100.8 fL — ABNORMAL HIGH (ref 80.0–100.0)
Monocytes Absolute: 0.5 K/uL (ref 0.1–1.0)
Monocytes Relative: 14 %
Neutro Abs: 1.6 K/uL — ABNORMAL LOW (ref 1.7–7.7)
Neutrophils Relative %: 49 %
Platelet Count: 61 K/uL — ABNORMAL LOW (ref 150–400)
RBC: 2.41 MIL/uL — ABNORMAL LOW (ref 4.22–5.81)
RDW: 18.6 % — ABNORMAL HIGH (ref 11.5–15.5)
WBC Count: 3.2 K/uL — ABNORMAL LOW (ref 4.0–10.5)
nRBC: 0 % (ref 0.0–0.2)

## 2024-05-14 LAB — SAMPLE TO BLOOD BANK

## 2024-05-14 LAB — TSH: TSH: 5.19 u[IU]/mL — ABNORMAL HIGH (ref 0.350–4.500)

## 2024-05-14 NOTE — Progress Notes (Signed)
 Patient came in today for his libtayo . His hemoglobin is 7.9, his platelets are 61 and his Scr 1.51. His ANC dropped from 4.8 to 1.6. Per Dr. Sherrod- hold treatment for a week primarily r/t the platelets. Patient informed.

## 2024-05-16 LAB — T4: T4, Total: 7.9 ug/dL (ref 4.5–12.0)

## 2024-05-18 ENCOUNTER — Encounter: Payer: Self-pay | Admitting: Internal Medicine

## 2024-05-21 ENCOUNTER — Inpatient Hospital Stay

## 2024-05-21 VITALS — BP 127/84 | HR 98 | Temp 98.3°F | Resp 18 | Wt 178.8 lb

## 2024-05-21 DIAGNOSIS — C3412 Malignant neoplasm of upper lobe, left bronchus or lung: Secondary | ICD-10-CM

## 2024-05-21 DIAGNOSIS — Z5112 Encounter for antineoplastic immunotherapy: Secondary | ICD-10-CM | POA: Diagnosis not present

## 2024-05-21 LAB — CBC WITH DIFFERENTIAL (CANCER CENTER ONLY)
Abs Immature Granulocytes: 0.01 K/uL (ref 0.00–0.07)
Basophils Absolute: 0 K/uL (ref 0.0–0.1)
Basophils Relative: 1 %
Eosinophils Absolute: 0.2 K/uL (ref 0.0–0.5)
Eosinophils Relative: 6 %
HCT: 26.4 % — ABNORMAL LOW (ref 39.0–52.0)
Hemoglobin: 8.9 g/dL — ABNORMAL LOW (ref 13.0–17.0)
Immature Granulocytes: 0 %
Lymphocytes Relative: 31 %
Lymphs Abs: 1 K/uL (ref 0.7–4.0)
MCH: 33.2 pg (ref 26.0–34.0)
MCHC: 33.7 g/dL (ref 30.0–36.0)
MCV: 98.5 fL (ref 80.0–100.0)
Monocytes Absolute: 0.3 K/uL (ref 0.1–1.0)
Monocytes Relative: 10 %
Neutro Abs: 1.7 K/uL (ref 1.7–7.7)
Neutrophils Relative %: 52 %
Platelet Count: 80 K/uL — ABNORMAL LOW (ref 150–400)
RBC: 2.68 MIL/uL — ABNORMAL LOW (ref 4.22–5.81)
RDW: 17.2 % — ABNORMAL HIGH (ref 11.5–15.5)
WBC Count: 3.4 K/uL — ABNORMAL LOW (ref 4.0–10.5)
nRBC: 0 % (ref 0.0–0.2)

## 2024-05-21 LAB — CMP (CANCER CENTER ONLY)
ALT: 5 U/L (ref 0–44)
AST: 13 U/L — ABNORMAL LOW (ref 15–41)
Albumin: 3.5 g/dL (ref 3.5–5.0)
Alkaline Phosphatase: 164 U/L — ABNORMAL HIGH (ref 38–126)
Anion gap: 7 (ref 5–15)
BUN: 26 mg/dL — ABNORMAL HIGH (ref 8–23)
CO2: 23 mmol/L (ref 22–32)
Calcium: 9.1 mg/dL (ref 8.9–10.3)
Chloride: 102 mmol/L (ref 98–111)
Creatinine: 1.92 mg/dL — ABNORMAL HIGH (ref 0.61–1.24)
GFR, Estimated: 37 mL/min — ABNORMAL LOW (ref >60)
Glucose, Bld: 92 mg/dL (ref 70–99)
Potassium: 4.4 mmol/L (ref 3.5–5.1)
Sodium: 132 mmol/L — ABNORMAL LOW (ref 135–145)
Total Bilirubin: 0.4 mg/dL (ref 0.0–1.2)
Total Protein: 7.9 g/dL (ref 6.5–8.1)

## 2024-05-21 LAB — SAMPLE TO BLOOD BANK

## 2024-05-21 MED ORDER — SODIUM CHLORIDE 0.9 % IV SOLN: INTRAVENOUS | Status: DC

## 2024-05-21 MED ORDER — SODIUM CHLORIDE 0.9 % IV SOLN
350.0000 mg | Freq: Once | INTRAVENOUS | Status: AC
  Administered 2024-05-21: 350 mg via INTRAVENOUS
  Filled 2024-05-21: qty 7

## 2024-05-21 NOTE — Patient Instructions (Signed)
 [facility] - A DEPT OF [facility]  Discharge Instructions: Thank you for choosing [facility] to provide your oncology and hematology care.   If you have a lab appointment with the Cancer Center, please go directly to the Cancer Center and check in at the registration area.   Wear comfortable clothing and clothing appropriate for easy access to any Portacath or PICC line.   We strive to give you quality time with your provider. You may need to reschedule your appointment if you arrive late (15 or more minutes).  Arriving late affects you and other patients whose appointments are after yours.  Also, if you miss three or more appointments without notifying the office, you may be dismissed from the clinic at the provider's discretion.      For prescription refill requests, have your pharmacy contact our office and allow 72 hours for refills to be completed.    Today you received the following chemotherapy and/or immunotherapy agents: cemiplimab -rwlc (LIBTAYO )    To help prevent nausea and vomiting after your treatment, we encourage you to take your nausea medication as directed.  BELOW ARE SYMPTOMS THAT SHOULD BE REPORTED IMMEDIATELY: *FEVER GREATER THAN 100.4 F (38 C) OR HIGHER *CHILLS OR SWEATING *NAUSEA AND VOMITING THAT IS NOT CONTROLLED WITH YOUR NAUSEA MEDICATION *UNUSUAL SHORTNESS OF BREATH *UNUSUAL BRUISING OR BLEEDING *URINARY PROBLEMS (pain or burning when urinating, or frequent urination) *BOWEL PROBLEMS (unusual diarrhea, constipation, pain near the anus) TENDERNESS IN MOUTH AND THROAT WITH OR WITHOUT PRESENCE OF ULCERS (sore throat, sores in mouth, or a toothache) UNUSUAL RASH, SWELLING OR PAIN  UNUSUAL VAGINAL DISCHARGE OR ITCHING   Items with * indicate a potential emergency and should be followed up as soon as possible or go to the Emergency Department if any problems should occur.  Please show the CHEMOTHERAPY ALERT CARD or  IMMUNOTHERAPY ALERT CARD at check-in to the Emergency Department and triage nurse.  Should you have questions after your visit or need to cancel or reschedule your appointment, please contact [facility] - [facility]  Dept: [phone]  and follow the prompts.  Office hours are 8:00 a.m. to 4:30 p.m. Monday - Friday. Please note that voicemails left after 4:00 p.m. may not be returned until the following business day.  We are closed weekends and major holidays. You have access to a nurse at all times for urgent questions. Please call the main number to the clinic Dept: [phone] and follow the prompts.   For any non-urgent questions, you may also contact your provider using MyChart. We now offer e-Visits for anyone 58 and older to request care online for non-urgent symptoms. For details visit [email or web address].   Also download the MyChart app! Go to the app store, search MyChart, open the app, select [place], and log in with your MyChart username and password.

## 2024-05-21 NOTE — Progress Notes (Signed)
 Per Dr Sherrod, [facility] to treat with Cr 1.92, Platelets 80, and HR 106.

## 2024-05-22 LAB — T4: T4, Total: 8.4 ug/dL (ref 4.5–12.0)

## 2024-05-22 LAB — TSH: TSH: 4.23 u[IU]/mL (ref 0.350–4.500)

## 2024-05-23 ENCOUNTER — Inpatient Hospital Stay (INDEPENDENT_AMBULATORY_CARE_PROVIDER_SITE_OTHER): Admitting: Primary Care

## 2024-05-23 ENCOUNTER — Other Ambulatory Visit: Payer: Self-pay | Admitting: Pharmacist

## 2024-05-23 DIAGNOSIS — C3491 Malignant neoplasm of unspecified part of right bronchus or lung: Secondary | ICD-10-CM | POA: Diagnosis not present

## 2024-05-23 DIAGNOSIS — E1159 Type 2 diabetes mellitus with other circulatory complications: Secondary | ICD-10-CM | POA: Diagnosis not present

## 2024-05-23 DIAGNOSIS — J45909 Unspecified asthma, uncomplicated: Secondary | ICD-10-CM | POA: Diagnosis not present

## 2024-05-23 DIAGNOSIS — E440 Moderate protein-calorie malnutrition: Secondary | ICD-10-CM | POA: Diagnosis not present

## 2024-05-23 DIAGNOSIS — C3492 Malignant neoplasm of unspecified part of left bronchus or lung: Secondary | ICD-10-CM | POA: Diagnosis not present

## 2024-05-23 DIAGNOSIS — J439 Emphysema, unspecified: Secondary | ICD-10-CM | POA: Diagnosis not present

## 2024-05-23 DIAGNOSIS — F419 Anxiety disorder, unspecified: Secondary | ICD-10-CM | POA: Diagnosis not present

## 2024-05-23 DIAGNOSIS — C7951 Secondary malignant neoplasm of bone: Secondary | ICD-10-CM | POA: Diagnosis not present

## 2024-05-23 DIAGNOSIS — I152 Hypertension secondary to endocrine disorders: Secondary | ICD-10-CM | POA: Diagnosis not present

## 2024-05-23 NOTE — Progress Notes (Signed)
 Pharmacy Quality Measure Review  This patient is appearing on a report for being at risk of failing the adherence measure for cholesterol (statin) medications this calendar year.   Medication: atorvastatin  Last fill date: 05/09/2024 for 90 day supply  Insurance report was not up to date. No action needed at this time.   Herlene Fleeta Morris, PharmD, JAQUELINE, CPP Clinical Pharmacist [facility] [phone]

## 2024-05-28 ENCOUNTER — Ambulatory Visit: Attending: Family Medicine | Admitting: Family Medicine

## 2024-05-28 ENCOUNTER — Encounter: Payer: Self-pay | Admitting: Family Medicine

## 2024-05-28 ENCOUNTER — Ambulatory Visit: Admitting: Family Medicine

## 2024-05-28 VITALS — BP 127/74 | HR 86 | Temp 97.6°F | Ht 74.0 in | Wt 180.4 lb

## 2024-05-28 DIAGNOSIS — G620 Drug-induced polyneuropathy: Secondary | ICD-10-CM

## 2024-05-28 DIAGNOSIS — E1169 Type 2 diabetes mellitus with other specified complication: Secondary | ICD-10-CM

## 2024-05-28 DIAGNOSIS — Z713 Dietary counseling and surveillance: Secondary | ICD-10-CM | POA: Diagnosis not present

## 2024-05-28 DIAGNOSIS — G621 Alcoholic polyneuropathy: Secondary | ICD-10-CM

## 2024-05-28 DIAGNOSIS — Z7182 Exercise counseling: Secondary | ICD-10-CM

## 2024-05-28 DIAGNOSIS — R21 Rash and other nonspecific skin eruption: Secondary | ICD-10-CM | POA: Diagnosis not present

## 2024-05-28 DIAGNOSIS — E1142 Type 2 diabetes mellitus with diabetic polyneuropathy: Secondary | ICD-10-CM

## 2024-05-28 DIAGNOSIS — T451X5A Adverse effect of antineoplastic and immunosuppressive drugs, initial encounter: Secondary | ICD-10-CM

## 2024-05-28 MED ORDER — TRIAMCINOLONE ACETONIDE 0.1 % EX CREA: 1.0000 | TOPICAL_CREAM | Freq: Two times a day (BID) | CUTANEOUS | 1 refills | Status: AC

## 2024-05-28 MED ORDER — GABAPENTIN 300 MG PO CAPS: 300.0000 mg | ORAL_CAPSULE | Freq: Every day | ORAL | 1 refills | Status: AC
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
## 2024-05-28 NOTE — Patient Instructions (Signed)
 VISIT SUMMARY:  During your visit, we addressed your persistent rash, neuropathy, and hypertension. We discussed your symptoms, reviewed your medications, and made adjustments to your treatment plan to better manage your conditions.  YOUR PLAN:  -PRURITIC RASH OF LEFT ARM AND BACK: You have a persistent rash on your back, left arm, and leg that causes significant itching. This condition is being treated with triamcinolone  cream, which you should apply along with lotion or Vaseline to the affected areas. You have also been referred to a dermatologist for further evaluation.  -ALCOHOLIC AND DIABETIC PERIPHERAL NEUROPATHY: Peripheral neuropathy is a condition that causes numbness and pain in your hands and feet, often due to alcohol  use and diabetes. To help manage this, you should restart taking gabapentin  300 mg orally at bedtime.  -HYPERTENSION: Hypertension, or high blood pressure, is being managed with amlodipine . We have discontinued the inappropriate use of metoprolol  and resumed your amlodipine  5 mg orally at bedtime. A nurse will assist you with medication management and the disposal of metoprolol .  INSTRUCTIONS:  Please follow up with the dermatologist as referred for your rash. Continue taking gabapentin  and amlodipine  as prescribed. A nurse will assist you with your medication management and ensure the proper disposal of metoprolol .

## 2024-05-29 DIAGNOSIS — J439 Emphysema, unspecified: Secondary | ICD-10-CM | POA: Diagnosis not present

## 2024-06-06 ENCOUNTER — Ambulatory Visit (HOSPITAL_COMMUNITY)
Admission: RE | Admit: 2024-06-06 | Discharge: 2024-06-06 | Disposition: A | Source: Ambulatory Visit | Attending: Cardiology

## 2024-06-06 DIAGNOSIS — I427 Cardiomyopathy due to drug and external agent: Secondary | ICD-10-CM | POA: Insufficient documentation

## 2024-06-06 LAB — ECHOCARDIOGRAM COMPLETE
AR max vel: 2.66 cm2
AV Area VTI: 2.77 cm2
AV Area mean vel: 2.67 cm2
AV Mean grad: 4 mmHg
AV Peak grad: 7.8 mmHg
Ao pk vel: 1.4 m/s
Area-P 1/2: 3.77 cm2
MV M vel: 2.02 m/s
MV Peak grad: 16.3 mmHg
P 1/2 time: 629 ms
S' Lateral: 5.17 cm

## 2024-06-08 ENCOUNTER — Inpatient Hospital Stay

## 2024-06-08 ENCOUNTER — Inpatient Hospital Stay: Admitting: Physician Assistant

## 2024-06-11 ENCOUNTER — Encounter: Payer: Self-pay | Admitting: Internal Medicine

## 2024-06-11 ENCOUNTER — Inpatient Hospital Stay

## 2024-06-11 ENCOUNTER — Inpatient Hospital Stay: Admitting: Internal Medicine

## 2024-06-11 ENCOUNTER — Inpatient Hospital Stay: Attending: Physician Assistant

## 2024-06-11 ENCOUNTER — Telehealth: Payer: Self-pay

## 2024-06-11 ENCOUNTER — Other Ambulatory Visit: Payer: Self-pay

## 2024-06-11 VITALS — BP 115/89 | HR 95 | Temp 97.9°F | Resp 17 | Ht 74.0 in | Wt 177.9 lb

## 2024-06-11 DIAGNOSIS — C3431 Malignant neoplasm of lower lobe, right bronchus or lung: Secondary | ICD-10-CM | POA: Diagnosis not present

## 2024-06-11 DIAGNOSIS — Z5112 Encounter for antineoplastic immunotherapy: Secondary | ICD-10-CM | POA: Diagnosis present

## 2024-06-11 DIAGNOSIS — C3412 Malignant neoplasm of upper lobe, left bronchus or lung: Secondary | ICD-10-CM

## 2024-06-11 DIAGNOSIS — C3490 Malignant neoplasm of unspecified part of unspecified bronchus or lung: Secondary | ICD-10-CM | POA: Diagnosis not present

## 2024-06-11 DIAGNOSIS — Z79620 Long term (current) use of immunosuppressive biologic: Secondary | ICD-10-CM | POA: Insufficient documentation

## 2024-06-11 DIAGNOSIS — C7951 Secondary malignant neoplasm of bone: Secondary | ICD-10-CM | POA: Insufficient documentation

## 2024-06-11 LAB — CBC WITH DIFFERENTIAL (CANCER CENTER ONLY)
Abs Immature Granulocytes: 0 K/uL (ref 0.00–0.07)
Basophils Absolute: 0 K/uL (ref 0.0–0.1)
Basophils Relative: 1 %
Eosinophils Absolute: 0.3 K/uL (ref 0.0–0.5)
Eosinophils Relative: 10 %
HCT: 28.4 % — ABNORMAL LOW (ref 39.0–52.0)
Hemoglobin: 9.6 g/dL — ABNORMAL LOW (ref 13.0–17.0)
Immature Granulocytes: 0 %
Lymphocytes Relative: 34 %
Lymphs Abs: 1 K/uL (ref 0.7–4.0)
MCH: 32.1 pg (ref 26.0–34.0)
MCHC: 33.8 g/dL (ref 30.0–36.0)
MCV: 95 fL (ref 80.0–100.0)
Monocytes Absolute: 0.3 K/uL (ref 0.1–1.0)
Monocytes Relative: 10 %
Neutro Abs: 1.4 K/uL — ABNORMAL LOW (ref 1.7–7.7)
Neutrophils Relative %: 45 %
Platelet Count: 72 K/uL — ABNORMAL LOW (ref 150–400)
RBC: 2.99 MIL/uL — ABNORMAL LOW (ref 4.22–5.81)
RDW: 15.3 % (ref 11.5–15.5)
WBC Count: 3 K/uL — ABNORMAL LOW (ref 4.0–10.5)
nRBC: 0 % (ref 0.0–0.2)

## 2024-06-11 LAB — CMP (CANCER CENTER ONLY)
ALT: 9 U/L (ref 0–44)
AST: 20 U/L (ref 15–41)
Albumin: 3.9 g/dL (ref 3.5–5.0)
Alkaline Phosphatase: 175 U/L — ABNORMAL HIGH (ref 38–126)
Anion gap: 10 (ref 5–15)
BUN: 27 mg/dL — ABNORMAL HIGH (ref 8–23)
CO2: 22 mmol/L (ref 22–32)
Calcium: 9.3 mg/dL (ref 8.9–10.3)
Chloride: 102 mmol/L (ref 98–111)
Creatinine: 1.81 mg/dL — ABNORMAL HIGH (ref 0.61–1.24)
GFR, Estimated: 40 mL/min — ABNORMAL LOW (ref >60)
Glucose, Bld: 95 mg/dL (ref 70–99)
Potassium: 4.2 mmol/L (ref 3.5–5.1)
Sodium: 134 mmol/L — ABNORMAL LOW (ref 135–145)
Total Bilirubin: 0.5 mg/dL (ref 0.0–1.2)
Total Protein: 8.4 g/dL — ABNORMAL HIGH (ref 6.5–8.1)

## 2024-06-11 LAB — SAMPLE TO BLOOD BANK

## 2024-06-11 MED ORDER — SODIUM CHLORIDE 0.9 % IV SOLN
350.0000 mg | Freq: Once | INTRAVENOUS | Status: AC
  Administered 2024-06-11: 350 mg via INTRAVENOUS
  Filled 2024-06-11: qty 7

## 2024-06-11 MED ORDER — DULOXETINE HCL 30 MG PO CPEP: 30.0000 mg | ORAL_CAPSULE | Freq: Every day | ORAL | 3 refills | Status: AC

## 2024-06-11 MED ORDER — SODIUM CHLORIDE 0.9 % IV SOLN: INTRAVENOUS | Status: DC
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
## 2024-06-11 NOTE — Progress Notes (Signed)
 Per. Dr. Sherrod okay for patient to receive treatment today with ANC 1.4, creatine 1.81 and platelets 72.

## 2024-06-11 NOTE — Telephone Encounter (Signed)
 Spoke with pts daughter. She was notified of pts echo results. She voiced understanding. Pt will do daily weights, monitor BP and bring in all medications at next apt. Apt was scheduled for 07/04/24 with Thom Kelch PA.

## 2024-06-11 NOTE — Patient Instructions (Signed)
 [facility] - A DEPT OF [facility]  Discharge Instructions: Thank you for choosing [facility] to provide your oncology and hematology care.   If you have a lab appointment with the Cancer Center, please go directly to the Cancer Center and check in at the registration area.   Wear comfortable clothing and clothing appropriate for easy access to any Portacath or PICC line.   We strive to give you quality time with your provider. You may need to reschedule your appointment if you arrive late (15 or more minutes).  Arriving late affects you and other patients whose appointments are after yours.  Also, if you miss three or more appointments without notifying the office, you may be dismissed from the clinic at the provider's discretion.      For prescription refill requests, have your pharmacy contact our office and allow 72 hours for refills to be completed.    Today you received the following chemotherapy and/or immunotherapy agents: cemiplimab -rwlc (LIBTAYO )    To help prevent nausea and vomiting after your treatment, we encourage you to take your nausea medication as directed.  BELOW ARE SYMPTOMS THAT SHOULD BE REPORTED IMMEDIATELY: *FEVER GREATER THAN 100.4 F (38 C) OR HIGHER *CHILLS OR SWEATING *NAUSEA AND VOMITING THAT IS NOT CONTROLLED WITH YOUR NAUSEA MEDICATION *UNUSUAL SHORTNESS OF BREATH *UNUSUAL BRUISING OR BLEEDING *URINARY PROBLEMS (pain or burning when urinating, or frequent urination) *BOWEL PROBLEMS (unusual diarrhea, constipation, pain near the anus) TENDERNESS IN MOUTH AND THROAT WITH OR WITHOUT PRESENCE OF ULCERS (sore throat, sores in mouth, or a toothache) UNUSUAL RASH, SWELLING OR PAIN  UNUSUAL VAGINAL DISCHARGE OR ITCHING   Items with * indicate a potential emergency and should be followed up as soon as possible or go to the Emergency Department if any problems should occur.  Please show the CHEMOTHERAPY ALERT CARD or  IMMUNOTHERAPY ALERT CARD at check-in to the Emergency Department and triage nurse.  Should you have questions after your visit or need to cancel or reschedule your appointment, please contact [facility] - [facility]  Dept: [phone]  and follow the prompts.  Office hours are 8:00 a.m. to 4:30 p.m. Monday - Friday. Please note that voicemails left after 4:00 p.m. may not be returned until the following business day.  We are closed weekends and major holidays. You have access to a nurse at all times for urgent questions. Please call the main number to the clinic Dept: [phone] and follow the prompts.   For any non-urgent questions, you may also contact your provider using MyChart. We now offer e-Visits for anyone 58 and older to request care online for non-urgent symptoms. For details visit [email or web address].   Also download the MyChart app! Go to the app store, search MyChart, open the app, select [place], and log in with your MyChart username and password.

## 2024-06-16 ENCOUNTER — Other Ambulatory Visit: Payer: Self-pay | Admitting: Family Medicine

## 2024-06-25 ENCOUNTER — Inpatient Hospital Stay

## 2024-06-25 ENCOUNTER — Ambulatory Visit (HOSPITAL_COMMUNITY)
Admission: RE | Admit: 2024-06-25 | Discharge: 2024-06-25 | Disposition: A | Discharge status: Home / Self Care | Source: Ambulatory Visit | Attending: Internal Medicine | Admitting: Internal Medicine

## 2024-06-25 ENCOUNTER — Inpatient Hospital Stay: Admitting: Physician Assistant

## 2024-06-25 DIAGNOSIS — C3490 Malignant neoplasm of unspecified part of unspecified bronchus or lung: Secondary | ICD-10-CM | POA: Insufficient documentation

## 2024-06-25 NOTE — Progress Notes (Signed)
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 Libtayo  (Cempilimab) every 3 weeks status post 2 cycles.  He has been tolerating this treatment well with no concerning adverse effects.  He continues to have baseline thrombocytopenia and renal insufficiency. The patient had repeat CT scan of the chest, abdomen and pelvis performed recently.  I personally and independently reviewed the scan and discussed the result with the patient today.  His scan showed no concerning findings for disease progression but there was new 0.5 cm right upper lobe nodule worrisome for metastasis.  We will continue to monitor this closely on upcoming imaging studies. I recommended for the patient to proceed with cycle #3 of the maintenance therapy today as planned. He will come back for follow-up visit in 3 weeks for evaluation before the next cycle of his treatment. The patient was advised to call immediately if he has any other concerning symptoms in the interval. Disclaimer: This note was dictated with voice recognition software. Similar sounding words can inadvertently be transcribed and may be missed upon review. Sherrod MARLA Sherrod, MD

## 2024-07-02 ENCOUNTER — Ambulatory Visit: Admitting: Podiatry

## 2024-07-02 ENCOUNTER — Inpatient Hospital Stay

## 2024-07-02 ENCOUNTER — Encounter: Payer: Self-pay | Admitting: Physician Assistant

## 2024-07-02 ENCOUNTER — Inpatient Hospital Stay: Admitting: Physician Assistant

## 2024-07-02 VITALS — BP 129/95 | HR 102 | Temp 97.4°F | Resp 18 | Wt 171.1 lb

## 2024-07-02 VITALS — BP 120/83 | HR 91 | Temp 97.6°F | Resp 20

## 2024-07-02 DIAGNOSIS — Z5112 Encounter for antineoplastic immunotherapy: Secondary | ICD-10-CM

## 2024-07-02 DIAGNOSIS — C3412 Malignant neoplasm of upper lobe, left bronchus or lung: Secondary | ICD-10-CM

## 2024-07-02 LAB — CMP (CANCER CENTER ONLY)
ALT: 10 U/L (ref 0–44)
AST: 27 U/L (ref 15–41)
Albumin: 4 g/dL (ref 3.5–5.0)
Alkaline Phosphatase: 155 U/L — ABNORMAL HIGH (ref 38–126)
Anion gap: 13 (ref 5–15)
BUN: 31 mg/dL — ABNORMAL HIGH (ref 8–23)
CO2: 22 mmol/L (ref 22–32)
Calcium: 9.4 mg/dL (ref 8.9–10.3)
Chloride: 96 mmol/L — ABNORMAL LOW (ref 98–111)
Creatinine: 1.59 mg/dL — ABNORMAL HIGH (ref 0.61–1.24)
GFR, Estimated: 47 mL/min — ABNORMAL LOW
Glucose, Bld: 90 mg/dL (ref 70–99)
Potassium: 4.1 mmol/L (ref 3.5–5.1)
Sodium: 132 mmol/L — ABNORMAL LOW (ref 135–145)
Total Bilirubin: 1 mg/dL (ref 0.0–1.2)
Total Protein: 8.6 g/dL — ABNORMAL HIGH (ref 6.5–8.1)

## 2024-07-02 LAB — CBC WITH DIFFERENTIAL (CANCER CENTER ONLY)
Abs Immature Granulocytes: 0 K/uL (ref 0.00–0.07)
Basophils Absolute: 0 K/uL (ref 0.0–0.1)
Basophils Relative: 1 %
Eosinophils Absolute: 0.1 K/uL (ref 0.0–0.5)
Eosinophils Relative: 5 %
HCT: 28.3 % — ABNORMAL LOW (ref 39.0–52.0)
Hemoglobin: 9.9 g/dL — ABNORMAL LOW (ref 13.0–17.0)
Immature Granulocytes: 0 %
Lymphocytes Relative: 39 %
Lymphs Abs: 0.9 K/uL (ref 0.7–4.0)
MCH: 31.1 pg (ref 26.0–34.0)
MCHC: 35 g/dL (ref 30.0–36.0)
MCV: 89 fL (ref 80.0–100.0)
Monocytes Absolute: 0.2 K/uL (ref 0.1–1.0)
Monocytes Relative: 9 %
Neutro Abs: 1.1 K/uL — ABNORMAL LOW (ref 1.7–7.7)
Neutrophils Relative %: 46 %
Platelet Count: 77 K/uL — ABNORMAL LOW (ref 150–400)
RBC: 3.18 MIL/uL — ABNORMAL LOW (ref 4.22–5.81)
RDW: 14.8 % (ref 11.5–15.5)
WBC Count: 2.6 K/uL — ABNORMAL LOW (ref 4.0–10.5)
nRBC: 0 % (ref 0.0–0.2)

## 2024-07-02 LAB — SAMPLE TO BLOOD BANK

## 2024-07-02 LAB — TSH: TSH: 2.06 u[IU]/mL (ref 0.350–4.500)

## 2024-07-02 MED ORDER — SODIUM CHLORIDE 0.9 % IV SOLN: INTRAVENOUS | Status: DC

## 2024-07-02 MED ORDER — SODIUM CHLORIDE 0.9 % IV SOLN
350.0000 mg | Freq: Once | INTRAVENOUS | Status: AC
  Administered 2024-07-02: 350 mg via INTRAVENOUS
  Filled 2024-07-02: qty 7

## 2024-07-02 NOTE — Progress Notes (Signed)
 Nutrition Assessment   Reason for Assessment:  Referral from Dr Gatha   ASSESSMENT:  68 year old male with metastatic lung cancer.  Past medical history of DM, GERD, HLD, HTN.  Patient receiving cemiplimab .    Met with patient during infusion.  Reports that he has no desire to eat.  Sometimes eats breakfast and may have 1/2 sandwich.  Usually does not eat lunch.  Supper is usually meat and sometimes vegetable.  Reports that he has oral nutrition supplements but they upset his stomach at times so does not drink them.     Medications: prilosec   Labs: Na 132, BUN 31, creatinine 1.59   Anthropometrics:   Height: 74 inches Weight: 171 lb UBW: 180 lb last at this weight 05/28/24 BMI: 21  5% weight loss in the last month   Estimated Energy Needs  Kcals: 1950-2300 Protein: 93-117 g Fluid: >   NUTRITION DIAGNOSIS: Inadequate oral intake related to cancer related treatment side effects as evidenced by 5% weight loss in the last month and decreased appetite    INTERVENTION:  Discussed ways to add calories and protein to diet.  High Calorie, High Protein Diet handout provided Consider appetite stimulant if weight continues to decline.  Encouraged patient to try oral nutrition supplement with food and/or drink half to see if better able to tolerate.   Contact information provided   MONITORING, EVALUATION, GOAL: weight trends, intake   Next Visit: Monday, Jan 19 during infusion  Vincent Ehrler B. Dasie SOLON, CSO, LDN Registered Dietitian [phone]

## 2024-07-02 NOTE — Patient Instructions (Signed)
 [facility] - A DEPT OF [facility]  Discharge Instructions: Thank you for choosing [facility] to provide your oncology and hematology care.   If you have a lab appointment with the Cancer Center, please go directly to the Cancer Center and check in at the registration area.   Wear comfortable clothing and clothing appropriate for easy access to any Portacath or PICC line.   We strive to give you quality time with your provider. You may need to reschedule your appointment if you arrive late (15 or more minutes).  Arriving late affects you and other patients whose appointments are after yours.  Also, if you miss three or more appointments without notifying the office, you may be dismissed from the clinic at the provider's discretion.      For prescription refill requests, have your pharmacy contact our office and allow 72 hours for refills to be completed.    Today you received the following chemotherapy and/or immunotherapy agents: cemiplimab -rwlc (LIBTAYO )    To help prevent nausea and vomiting after your treatment, we encourage you to take your nausea medication as directed.  BELOW ARE SYMPTOMS THAT SHOULD BE REPORTED IMMEDIATELY: *FEVER GREATER THAN 100.4 F (38 C) OR HIGHER *CHILLS OR SWEATING *NAUSEA AND VOMITING THAT IS NOT CONTROLLED WITH YOUR NAUSEA MEDICATION *UNUSUAL SHORTNESS OF BREATH *UNUSUAL BRUISING OR BLEEDING *URINARY PROBLEMS (pain or burning when urinating, or frequent urination) *BOWEL PROBLEMS (unusual diarrhea, constipation, pain near the anus) TENDERNESS IN MOUTH AND THROAT WITH OR WITHOUT PRESENCE OF ULCERS (sore throat, sores in mouth, or a toothache) UNUSUAL RASH, SWELLING OR PAIN  UNUSUAL VAGINAL DISCHARGE OR ITCHING   Items with * indicate a potential emergency and should be followed up as soon as possible or go to the Emergency Department if any problems should occur.  Please show the CHEMOTHERAPY ALERT CARD or  IMMUNOTHERAPY ALERT CARD at check-in to the Emergency Department and triage nurse.  Should you have questions after your visit or need to cancel or reschedule your appointment, please contact [facility] - [facility]  Dept: [phone]  and follow the prompts.  Office hours are 8:00 a.m. to 4:30 p.m. Monday - Friday. Please note that voicemails left after 4:00 p.m. may not be returned until the following business day.  We are closed weekends and major holidays. You have access to a nurse at all times for urgent questions. Please call the main number to the clinic Dept: [phone] and follow the prompts.   For any non-urgent questions, you may also contact your provider using MyChart. We now offer e-Visits for anyone 58 and older to request care online for non-urgent symptoms. For details visit [email or web address].   Also download the MyChart app! Go to the app store, search MyChart, open the app, select [place], and log in with your MyChart username and password.

## 2024-07-03 LAB — T4: T4, Total: 6.1 ug/dL (ref 4.5–12.0)

## 2024-07-04 ENCOUNTER — Encounter: Payer: Self-pay | Admitting: Cardiology

## 2024-07-04 ENCOUNTER — Ambulatory Visit: Admitting: Cardiology

## 2024-07-04 VITALS — BP 104/66 | HR 95 | Ht 74.0 in | Wt 177.6 lb

## 2024-07-04 DIAGNOSIS — I5020 Unspecified systolic (congestive) heart failure: Secondary | ICD-10-CM | POA: Diagnosis not present

## 2024-07-04 DIAGNOSIS — I10 Essential (primary) hypertension: Secondary | ICD-10-CM

## 2024-07-04 DIAGNOSIS — R000 Tachycardia, unspecified: Secondary | ICD-10-CM | POA: Diagnosis not present

## 2024-07-04 DIAGNOSIS — E782 Mixed hyperlipidemia: Secondary | ICD-10-CM

## 2024-07-04 MED ORDER — METOPROLOL SUCCINATE ER 25 MG PO TB24: 25.0000 mg | ORAL_TABLET | Freq: Every day | ORAL | 4 refills | Status: AC

## 2024-07-04 MED ORDER — EMPAGLIFLOZIN 10 MG PO TABS: 10.0000 mg | ORAL_TABLET | Freq: Every day | ORAL | 4 refills | Status: AC

## 2024-07-04 NOTE — Progress Notes (Signed)
 Alcohol  abuse causes increased iron absorption and high ferritin and Fe saturations

## 2024-07-04 NOTE — Patient Instructions (Addendum)
 Medication Instructions:  Stop Lopressor  Start Toprol  XL 25mg  daily, Jardiance 10mg  daily.  *If you need a refill on your cardiac medications before your next appointment, please call your pharmacy*  Lab Work: In 2 weeks Lipid Panel, BMET. If you have labs (blood work) drawn today and your tests are completely normal, you will receive your results only by: MyChart Message (if you have MyChart) OR A paper copy in the mail If you have any lab test that is abnormal or we need to change your treatment, we will call you to review the results.  Testing/Procedures: PET Stress test     Please report to Radiology at the [facility] Main Entrance 30 minutes early for your test.  [address] [place], [place] [address]                         OR   How to Prepare for Your Cardiac PET/CT Stress Test:  Nothing to eat or drink, except water, 3 hours prior to arrival time.  NO caffeine/decaffeinated products, or chocolate 12 hours prior to arrival. (Please note decaffeinated beverages (teas/coffees) still contain caffeine).  If you have caffeine within 12 hours prior, the test will need to be rescheduled.  Medication instructions: Do not take erectile dysfunction medications for 72 hours prior to test (sildenafil , tadalafil ) Do not take nitrates (isosorbide mononitrate, Ranexa) the day before or day of test Do not take tamsulosin the day before or morning of test Hold theophylline containing medications for 12 hours. Hold Dipyridamole 48 hours prior to the test.  Diabetic Preparation: If able to eat breakfast prior to 3 hour fasting, you may take all medications, including your insulin . Do not worry if you miss your breakfast dose of insulin  - start at your next meal. If you do not eat prior to 3 hour fast-Hold all diabetes (oral and insulin ) medications. Patients who wear a continuous glucose monitor MUST remove the device prior to scanning.  You may take your remaining  medications with water.  NO perfume, cologne or lotion on chest or abdomen area. FEMALES - Please avoid wearing dresses to this appointment.  Total time is 1 to 2 hours; you may want to bring reading material for the waiting time.  IF YOU THINK YOU MAY BE PREGNANT, OR ARE NURSING PLEASE INFORM THE TECHNOLOGIST.  In preparation for your appointment, medication and supplies will be purchased.  Appointment availability is limited, so if you need to cancel or reschedule, please call the Radiology Department Scheduler at [phone] 24 hours in advance to avoid a cancellation fee of $100.00  What to Expect When you Arrive:  Once you arrive and check in for your appointment, you will be taken to a preparation room within the Radiology Department.  A technologist or Nurse will obtain your medical history, verify that you are correctly prepped for the exam, and explain the procedure.  Afterwards, an IV will be started in your arm and electrodes will be placed on your skin for EKG monitoring during the stress portion of the exam. Then you will be escorted to the PET/CT scanner.  There, staff will get you positioned on the scanner and obtain a blood pressure and EKG.  During the exam, you will continue to be connected to the EKG and blood pressure machines.  A small, safe amount of a radioactive tracer will be injected in your IV to obtain a series of pictures of your heart along with  an injection of a stress agent.    After your Exam:  It is recommended that you eat a meal and drink a caffeinated beverage to counter act any effects of the stress agent.  Drink plenty of fluids for the remainder of the day and urinate frequently for the first couple of hours after the exam.  Your doctor will inform you of your test results within 7-10 business days.  For more information and frequently asked questions, please visit our website: [email or web address]  For questions about your test or how to  prepare for your test, please call: Cardiac Imaging Nurse Navigators Office: [phone]  Echocardiogram in 2 -3 months. Your physician has requested that you have an echocardiogram. Echocardiography is a painless test that uses sound waves to create images of your heart. It provides your doctor with information about the size and shape of your heart and how well your hearts chambers and valves are working. This procedure takes approximately one hour. There are no restrictions for this procedure. Please do NOT wear cologne, perfume, aftershave, or lotions (deodorant is allowed). Please arrive 15 minutes prior to your appointment time.  Please note: We ask at that you not bring children with you during ultrasound (echo/ vascular) testing. Due to room size and safety concerns, children are not allowed in the ultrasound rooms during exams. Our front office staff cannot provide observation of children in our lobby area while testing is being conducted. An adult accompanying a patient to their appointment will only be allowed in the ultrasound room at the discretion of the ultrasound technician under special circumstances. We apologize for any inconvenience.  Follow-Up: At [facility], you and your health needs are our priority.  As part of our continuing mission to provide you with exceptional heart care, our providers are all part of one team.  This team includes your primary Cardiologist (physician) and Advanced Practice Providers or APPs (Physician Assistants and Nurse Practitioners) who all work together to provide you with the care you need, when you need it.  Your next appointment:    After Echocardiogram  Provider:   Jerel Balding, MD    We recommend signing up for the patient portal called MyChart.  Sign up information is provided on this After Visit Summary.  MyChart is used to connect with patients for Virtual Visits (Telemedicine).  Patients are able to view lab/test results,  encounter notes, upcoming appointments, etc.  Non-urgent messages can be sent to your provider as well.   To learn more about what you can do with MyChart, go to [email or web address].   Other Instructions Check blood pressure daily, keep a log and bring to the office for review.  Check weight daily and notify the office if you gain 3 pounds or more in 24 hours.

## 2024-07-04 NOTE — Progress Notes (Signed)
 " Cardiology Office Note:  .   Date:  07/04/2024  ID:  [name], DOB Nov 02, 1955, MRN [id number] PCP: [name], MD  [facility] Providers Cardiologist:  Jerel Balding, MD {  History of Present Illness: .   [name] is a 68 y.o. male with history of chronic HFrEF, metastatic SCC on chemo, chronic anemia and thrombocytopenia, hypertension, history of alcohol  abuse, pancytopenia, HLD, type 2 diabetes.      Cardiomyopathy 06/2024 reported SOB and on chemotherapy drugs.  History of alcohol  abuse.  Echocardiogram EF 35 to 40%  Sinus tachycardia 04/2024 oncology office reportedly in SVT, at the ED he was in sinus tach (reviewed by cardio), but admitted. No signs of infections and no PE.  However he did have leukocytosis, low magnesium  (persistently an issues with critically low results) and metabolic acidosis.   Bradycardia 01/2023 reported heart rates in the 30s prior to cataract surgery. In the ED normal HR, resolved.    Lung cancer Metastatic non-small cell lung cancer initially diagnosed as stage IIIA (T2a,N2, M0) lung cancer probably squamous cell carcinoma. Metastatic disease with left-sided upper lobe cavitary mass as well as metastatic disease to the L4 vertebral body diagnosed in May 2025.     Social history  Half pack per day smoker History of alcohol  abuse, he has been sober for about 4+ months now.    Patient with history of sinus tachycardia with chronically low magnesium  and issues of metabolic acidosis.  He has history of metastatic non-small cell lung cancer with metastases to his spine.  I saw patient 05/2024 and he had reported SOB, echocardiogram demonstrating newly reduced EF 35 to 40%.  BNP was 338.  He was euvolemic on exam.  Of note was on cemiplimab , LV was moderately dilated so felt to be of longer chronicity. Shortness of breath less likely to be related to CHF and more related to his lungs given no elevation in filling pressures.  Cannot  rule out chemotherapy-induced cardiomyopathy.  Today patient presents for follow-up.  He is accompanied with his daughter again.  He has no complaints today.  Reports no significant episodes of shortness of breath, DOE, peripheral edema, PND.  He is not superactive but this is secondary to neuropathy that likely is related to chemotherapy.  Weights have been stable.  Has had no episodes of chest pain.  Continues to smoke.  Continues to remain sober.  Previously was drinking almost daily 1/5 of alcohol  with a friend.  ROS: Denies: Chest pain, shortness of breath, orthopnea, peripheral edema, palpitations, syncope, decreased exercise capacity, fatigue, dizziness.   Studies Reviewed: .         Risk Assessment/Calculations:           Physical Exam:   VS:  BP 104/66   Pulse 95   Ht 6' 2 (1.88 m)   Wt 177 lb 9.6 oz (80.6 kg)   SpO2 99%   BMI 22.80 kg/m    Wt Readings from Last 6 Encounters:  07/04/24 177 lb 9.6 oz (80.6 kg)  07/02/24 171 lb 0.9 oz (77.6 kg)  06/11/24 177 lb 14.4 oz (80.7 kg)  05/28/24 180 lb 6.4 oz (81.8 kg)  05/21/24 178 lb 12 oz (81.1 kg)  05/14/24 188 lb (85.3 kg)    GEN: Well nourished, well developed in no acute distress NECK: No JVD; No carotid bruits CARDIAC: RRR, no murmurs, rubs, gallops RESPIRATORY:  Clear to auscultation without rales, wheezing or rhonchi  ABDOMEN: Soft, non-tender, non-distended EXTREMITIES:  No edema; No deformity   ASSESSMENT AND PLAN: .    Chronic HFrEF -06/2024 echocardiogram EF 35 to 40%, normal RV.  Mild MR.  Mild to moderate TR.  Mild to moderate AR. Etiology unclear, may be related to chemotherapy drugs or alcohol  abuse history.  But ischemic etiologies need to be ruled out.  He is without significant symptoms, NYHA class I/II, functionally limited by neuropathy rather than shortness of breath.  Euvolemic on exam with stable.  Weight today 171 pounds. Proceeding with NM PET stress test to rule out ischemic disease, coronary CTA  contraindicated with his degree of CKD. GDMT: Blood pressure is soft and with underlying renal disease this limits things.  For now we will start with Jardiance 10 mg, consolidate Lopressor  to Toprol  XL 25 mg daily. Repeat BMP 2 weeks Repeat echocardiogram 2 to 3 months TSH normal Iron studies suggest some degree of overload.  Iron 223, TIBC 253, saturation ratio 88, ferritin level 277.  Not sure if this has any clinical significance, will need to review with MD in the context of his cardiomyopathy.  Suspect this may be related to underlying chemotherapy treatment or cancer.  Sinus tachycardia Improved, heart rate 95 today.  Non-small cell lung cancer Following oncology.  He is on palliative chemotherapy with reduced dose carboplatin , paclitaxel , and Libtayo .    Hypertension 104/66 today.  He is not on any BP meds besides metoprolol .   Hyperlipidemia Continue atorvastatin  40 mg daily.  Repeat LDL when fasted, overdue.  History of alcohol  abuse Been sober for about 4+ months now.  Congratulated him and encouraged continued cessation.   Diabetes Very well-controlled A1c within normal range.    Dispo: Follow-up after echocardiogram around 3 months  Signed, Thom LITTIE Sluder, PA-C  "

## 2024-07-06 ENCOUNTER — Other Ambulatory Visit: Payer: Self-pay

## 2024-07-11 ENCOUNTER — Ambulatory Visit: Admitting: Podiatry

## 2024-07-11 ENCOUNTER — Encounter: Payer: Self-pay | Admitting: Podiatry

## 2024-07-11 DIAGNOSIS — M79672 Pain in left foot: Secondary | ICD-10-CM | POA: Diagnosis not present

## 2024-07-11 DIAGNOSIS — B351 Tinea unguium: Secondary | ICD-10-CM

## 2024-07-11 DIAGNOSIS — M79671 Pain in right foot: Secondary | ICD-10-CM | POA: Diagnosis not present

## 2024-07-11 NOTE — Progress Notes (Signed)
 Patient presents for evaluation and treatment of tenderness and some redness around nails feet.  Tenderness around toes with walking and wearing shoes.  Physical exam:  General appearance: Alert, pleasant, and in no acute distress.  Vascular: Pedal pulses: DP 2/4 B/L, PT 1/4 B/L. Mild edema lower legs bilaterally.  Capillary refill time immediate bilaterally  Neurologic:  Dermatologic:  Nails thickened, disfigured, discolored 1-5 BL with subungual debris.  Redness and hypertrophic nail folds along nail folds bilaterally but no signs of drainage or infection.  Musculoskeletal:     Diagnosis: 1. Painful onychomycotic nails 1 through 5 bilaterally. 2. Pain toes 1 through 5 bilaterally.  Plan: -Debrided onychomycotic nails 1 through 5 bilaterally.  Sharply debrided nails with nail clipper and reduced with a power bur.  Return 3 months [facility]

## 2024-07-12 ENCOUNTER — Other Ambulatory Visit: Payer: Self-pay

## 2024-07-16 ENCOUNTER — Inpatient Hospital Stay: Admitting: Internal Medicine

## 2024-07-16 ENCOUNTER — Inpatient Hospital Stay

## 2024-07-23 ENCOUNTER — Inpatient Hospital Stay

## 2024-07-23 ENCOUNTER — Inpatient Hospital Stay: Admitting: Internal Medicine

## 2024-07-23 ENCOUNTER — Inpatient Hospital Stay: Attending: Physician Assistant

## 2024-07-23 VITALS — BP 125/97 | HR 79 | Temp 98.3°F | Resp 17 | Ht 74.0 in | Wt 174.7 lb

## 2024-07-23 VITALS — BP 118/93 | HR 77 | Resp 18

## 2024-07-23 DIAGNOSIS — C3491 Malignant neoplasm of unspecified part of right bronchus or lung: Secondary | ICD-10-CM

## 2024-07-23 DIAGNOSIS — Z5112 Encounter for antineoplastic immunotherapy: Secondary | ICD-10-CM | POA: Insufficient documentation

## 2024-07-23 DIAGNOSIS — C3412 Malignant neoplasm of upper lobe, left bronchus or lung: Secondary | ICD-10-CM

## 2024-07-23 DIAGNOSIS — Z79620 Long term (current) use of immunosuppressive biologic: Secondary | ICD-10-CM | POA: Insufficient documentation

## 2024-07-23 DIAGNOSIS — C7951 Secondary malignant neoplasm of bone: Secondary | ICD-10-CM | POA: Insufficient documentation

## 2024-07-23 DIAGNOSIS — Z87891 Personal history of nicotine dependence: Secondary | ICD-10-CM | POA: Insufficient documentation

## 2024-07-23 LAB — CMP (CANCER CENTER ONLY)
ALT: 6 U/L (ref 0–44)
AST: 18 U/L (ref 15–41)
Albumin: 4 g/dL (ref 3.5–5.0)
Alkaline Phosphatase: 120 U/L (ref 38–126)
Anion gap: 11 (ref 5–15)
BUN: 16 mg/dL (ref 8–23)
CO2: 24 mmol/L (ref 22–32)
Calcium: 8.9 mg/dL (ref 8.9–10.3)
Chloride: 94 mmol/L — ABNORMAL LOW (ref 98–111)
Creatinine: 1.56 mg/dL — ABNORMAL HIGH (ref 0.61–1.24)
GFR, Estimated: 47 mL/min — ABNORMAL LOW
Glucose, Bld: 116 mg/dL — ABNORMAL HIGH (ref 70–99)
Potassium: 3.4 mmol/L — ABNORMAL LOW (ref 3.5–5.1)
Sodium: 129 mmol/L — ABNORMAL LOW (ref 135–145)
Total Bilirubin: 0.8 mg/dL (ref 0.0–1.2)
Total Protein: 8.3 g/dL — ABNORMAL HIGH (ref 6.5–8.1)

## 2024-07-23 LAB — CBC WITH DIFFERENTIAL (CANCER CENTER ONLY)
Abs Immature Granulocytes: 0.01 K/uL (ref 0.00–0.07)
Basophils Absolute: 0 K/uL (ref 0.0–0.1)
Basophils Relative: 1 %
Eosinophils Absolute: 0.1 K/uL (ref 0.0–0.5)
Eosinophils Relative: 5 %
HCT: 29.7 % — ABNORMAL LOW (ref 39.0–52.0)
Hemoglobin: 10.1 g/dL — ABNORMAL LOW (ref 13.0–17.0)
Immature Granulocytes: 0 %
Lymphocytes Relative: 42 %
Lymphs Abs: 1 K/uL (ref 0.7–4.0)
MCH: 30.5 pg (ref 26.0–34.0)
MCHC: 34 g/dL (ref 30.0–36.0)
MCV: 89.7 fL (ref 80.0–100.0)
Monocytes Absolute: 0.3 K/uL (ref 0.1–1.0)
Monocytes Relative: 13 %
Neutro Abs: 0.9 K/uL — ABNORMAL LOW (ref 1.7–7.7)
Neutrophils Relative %: 39 %
Platelet Count: 66 K/uL — ABNORMAL LOW (ref 150–400)
RBC: 3.31 MIL/uL — ABNORMAL LOW (ref 4.22–5.81)
RDW: 17.2 % — ABNORMAL HIGH (ref 11.5–15.5)
WBC Count: 2.3 K/uL — ABNORMAL LOW (ref 4.0–10.5)
nRBC: 0 % (ref 0.0–0.2)

## 2024-07-23 MED ORDER — SODIUM CHLORIDE 0.9 % IV SOLN
350.0000 mg | Freq: Once | INTRAVENOUS | Status: AC
  Administered 2024-07-23: 350 mg via INTRAVENOUS
  Filled 2024-07-23: qty 7

## 2024-07-23 MED ORDER — SODIUM CHLORIDE 0.9 % IV SOLN: INTRAVENOUS | Status: DC

## 2024-07-23 NOTE — Progress Notes (Signed)
 Nutrition Follow-up:  Patient with metastatic lung cancer.  Receiving cemiplimab .    Met with patient during infusion.  Reports that appetite is still decreased.  Usually does not eat breakfast.  Lunch maybe 1/2 sandwich.  Dinner last night was chicken breast and fries.  Says he has tried ensure and it upset his stomach. Drinks milk at times.    Medications: reviewed  Labs: reviewed  Anthropometrics:   Weight 174 lb 11.2 oz   171 lb on 12/29 177 lb on 12/8 UBW of 180 lb    NUTRITION DIAGNOSIS: Inadequate oral intake continues   INTERVENTION:  Encouraged patient to set alarm to remind him to eat q 2 hours Encouraged high calorie, high protein diet Provided written names of shakes to try other than ensure (fairlife, premier protein, CIB) Discussed appetite stimulant.  He will follow-up with MD     MONITORING, EVALUATION, GOAL: weight trends, intake   NEXT VISIT: Mon Feb 9 during infusion  Ikia Cincotta B. Dasie SOLON, CSO, LDN Registered Dietitian [phone]
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
## 2024-07-23 NOTE — Patient Instructions (Signed)
 [facility] - A DEPT OF [facility]  Discharge Instructions: Thank you for choosing [facility] to provide your oncology and hematology care.   If you have a lab appointment with the Cancer Center, please go directly to the Cancer Center and check in at the registration area.   Wear comfortable clothing and clothing appropriate for easy access to any Portacath or PICC line.   We strive to give you quality time with your provider. You may need to reschedule your appointment if you arrive late (15 or more minutes).  Arriving late affects you and other patients whose appointments are after yours.  Also, if you miss three or more appointments without notifying the office, you may be dismissed from the clinic at the provider's discretion.      For prescription refill requests, have your pharmacy contact our office and allow 72 hours for refills to be completed.    Today you received the following chemotherapy and/or immunotherapy agents: cemiplimab -rwlc (LIBTAYO )    To help prevent nausea and vomiting after your treatment, we encourage you to take your nausea medication as directed.  BELOW ARE SYMPTOMS THAT SHOULD BE REPORTED IMMEDIATELY: *FEVER GREATER THAN 100.4 F (38 C) OR HIGHER *CHILLS OR SWEATING *NAUSEA AND VOMITING THAT IS NOT CONTROLLED WITH YOUR NAUSEA MEDICATION *UNUSUAL SHORTNESS OF BREATH *UNUSUAL BRUISING OR BLEEDING *URINARY PROBLEMS (pain or burning when urinating, or frequent urination) *BOWEL PROBLEMS (unusual diarrhea, constipation, pain near the anus) TENDERNESS IN MOUTH AND THROAT WITH OR WITHOUT PRESENCE OF ULCERS (sore throat, sores in mouth, or a toothache) UNUSUAL RASH, SWELLING OR PAIN  UNUSUAL VAGINAL DISCHARGE OR ITCHING   Items with * indicate a potential emergency and should be followed up as soon as possible or go to the Emergency Department if any problems should occur.  Please show the CHEMOTHERAPY ALERT CARD or  IMMUNOTHERAPY ALERT CARD at check-in to the Emergency Department and triage nurse.  Should you have questions after your visit or need to cancel or reschedule your appointment, please contact [facility] - [facility]  Dept: [phone]  and follow the prompts.  Office hours are 8:00 a.m. to 4:30 p.m. Monday - Friday. Please note that voicemails left after 4:00 p.m. may not be returned until the following business day.  We are closed weekends and major holidays. You have access to a nurse at all times for urgent questions. Please call the main number to the clinic Dept: [phone] and follow the prompts.   For any non-urgent questions, you may also contact your provider using MyChart. We now offer e-Visits for anyone 58 and older to request care online for non-urgent symptoms. For details visit [email or web address].   Also download the MyChart app! Go to the app store, search MyChart, open the app, select [place], and log in with your MyChart username and password.

## 2024-07-26 ENCOUNTER — Other Ambulatory Visit: Payer: Self-pay

## 2024-07-31 ENCOUNTER — Encounter (HOSPITAL_COMMUNITY): Payer: Self-pay

## 2024-08-01 ENCOUNTER — Other Ambulatory Visit: Payer: Self-pay | Admitting: Family Medicine

## 2024-08-01 ENCOUNTER — Encounter (HOSPITAL_COMMUNITY)

## 2024-08-02 ENCOUNTER — Telehealth: Payer: Self-pay

## 2024-08-02 ENCOUNTER — Other Ambulatory Visit: Payer: Self-pay

## 2024-08-02 DIAGNOSIS — I429 Cardiomyopathy, unspecified: Secondary | ICD-10-CM

## 2024-08-02 NOTE — Telephone Encounter (Signed)
 Pt daughter returned call to confirm that she received the VM and understands pt instructions. She questioned when the test will be scheduled. Informed her usually they will call to sch once prior auth has been approved. Msg sent to pre cert about test.

## 2024-08-02 NOTE — Telephone Encounter (Signed)
 I called the patient to let him know that a Cardiac Stress MRI has been ordered, that he will need to do blood work the week before the test and to pickup test instructions when he comes to do his blood work.

## 2024-08-13 ENCOUNTER — Inpatient Hospital Stay: Admitting: Internal Medicine

## 2024-08-13 ENCOUNTER — Inpatient Hospital Stay: Attending: Physician Assistant

## 2024-08-13 ENCOUNTER — Inpatient Hospital Stay

## 2024-08-15 ENCOUNTER — Other Ambulatory Visit (HOSPITAL_COMMUNITY)

## 2024-08-20 ENCOUNTER — Ambulatory Visit: Admitting: Cardiovascular Disease

## 2024-08-28 ENCOUNTER — Ambulatory Visit (HOSPITAL_COMMUNITY)

## 2024-09-03 ENCOUNTER — Inpatient Hospital Stay: Admitting: Physician Assistant

## 2024-09-03 ENCOUNTER — Inpatient Hospital Stay

## 2024-09-05 ENCOUNTER — Ambulatory Visit: Admitting: Family Medicine

## 2024-09-17 ENCOUNTER — Ambulatory Visit (HOSPITAL_COMMUNITY)

## 2024-09-24 ENCOUNTER — Inpatient Hospital Stay

## 2024-09-24 ENCOUNTER — Inpatient Hospital Stay: Admitting: Internal Medicine

## 2024-10-09 ENCOUNTER — Ambulatory Visit: Admitting: Podiatry

## 2024-10-15 ENCOUNTER — Inpatient Hospital Stay

## 2024-10-15 ENCOUNTER — Inpatient Hospital Stay: Admitting: Physician Assistant

## 2024-10-15 ENCOUNTER — Inpatient Hospital Stay: Attending: Physician Assistant

## 2024-10-15 ENCOUNTER — Inpatient Hospital Stay: Admitting: Internal Medicine
